# Patient Record
Sex: Female | Born: 1970
Health system: Southern US, Community
[De-identification: ages and names within clinical notes are randomized; demographics above are authoritative.]

## PROBLEM LIST (undated history)

## (undated) DIAGNOSIS — B009 Herpesviral infection, unspecified: Secondary | ICD-10-CM

## (undated) DIAGNOSIS — K219 Gastro-esophageal reflux disease without esophagitis: Secondary | ICD-10-CM

## (undated) DIAGNOSIS — F419 Anxiety disorder, unspecified: Secondary | ICD-10-CM

## (undated) DIAGNOSIS — E119 Type 2 diabetes mellitus without complications: Secondary | ICD-10-CM

## (undated) DIAGNOSIS — G2581 Restless legs syndrome: Secondary | ICD-10-CM

## (undated) DIAGNOSIS — M1990 Unspecified osteoarthritis, unspecified site: Secondary | ICD-10-CM

## (undated) DIAGNOSIS — T8859XA Other complications of anesthesia, initial encounter: Secondary | ICD-10-CM

## (undated) DIAGNOSIS — J45909 Unspecified asthma, uncomplicated: Secondary | ICD-10-CM

## (undated) DIAGNOSIS — T7840XA Allergy, unspecified, initial encounter: Secondary | ICD-10-CM

## (undated) DIAGNOSIS — E785 Hyperlipidemia, unspecified: Secondary | ICD-10-CM

## (undated) DIAGNOSIS — I10 Essential (primary) hypertension: Secondary | ICD-10-CM

## (undated) DIAGNOSIS — T884XXA Failed or difficult intubation, initial encounter: Secondary | ICD-10-CM

## (undated) DIAGNOSIS — F32A Depression, unspecified: Secondary | ICD-10-CM

## (undated) HISTORY — PX: DILATION AND CURETTAGE OF UTERUS: SHX78

## (undated) HISTORY — PX: TOTAL ABDOMINAL HYSTERECTOMY: SHX209

## (undated) HISTORY — DX: Hyperlipidemia, unspecified: E78.5

## (undated) HISTORY — DX: Restless legs syndrome: G25.81

## (undated) HISTORY — PX: TUBAL LIGATION: SHX77

## (undated) HISTORY — DX: Unspecified asthma, uncomplicated: J45.909

## (undated) HISTORY — DX: Depression, unspecified: F32.A

## (undated) HISTORY — DX: Allergy, unspecified, initial encounter: T78.40XA

## (undated) HISTORY — DX: Unspecified osteoarthritis, unspecified site: M19.90

## (undated) HISTORY — DX: Herpesviral infection, unspecified: B00.9

## (undated) HISTORY — PX: ABDOMINAL HYSTERECTOMY: SHX81

## (undated) HISTORY — DX: Gastro-esophageal reflux disease without esophagitis: K21.9

## (undated) HISTORY — DX: Failed or difficult intubation, initial encounter: T88.4XXA

## (undated) HISTORY — DX: Type 2 diabetes mellitus without complications: E11.9

## (undated) SURGERY — COLONOSCOPY WITH PROPOFOL
Anesthesia: Monitor Anesthesia Care

---

## 2008-12-03 ENCOUNTER — Emergency Department (HOSPITAL_COMMUNITY): Admission: EM | Admit: 2008-12-03 | Discharge: 2008-12-04 | Payer: Self-pay | Admitting: Family Medicine

## 2009-03-10 ENCOUNTER — Emergency Department (HOSPITAL_COMMUNITY): Admission: EM | Admit: 2009-03-10 | Discharge: 2009-03-10 | Payer: Self-pay | Admitting: Emergency Medicine

## 2009-04-22 ENCOUNTER — Emergency Department (HOSPITAL_COMMUNITY): Admission: EM | Admit: 2009-04-22 | Discharge: 2009-04-22 | Payer: Self-pay | Admitting: Emergency Medicine

## 2009-07-29 ENCOUNTER — Emergency Department (HOSPITAL_COMMUNITY): Admission: EM | Admit: 2009-07-29 | Discharge: 2009-07-29 | Payer: Self-pay | Admitting: Emergency Medicine

## 2011-01-04 ENCOUNTER — Inpatient Hospital Stay (INDEPENDENT_AMBULATORY_CARE_PROVIDER_SITE_OTHER)
Admission: RE | Admit: 2011-01-04 | Discharge: 2011-01-04 | Disposition: A | Discharge status: Home / Self Care | Payer: 59 | Source: Ambulatory Visit | Attending: Family Medicine | Admitting: Family Medicine

## 2011-01-04 DIAGNOSIS — K089 Disorder of teeth and supporting structures, unspecified: Secondary | ICD-10-CM

## 2011-02-06 LAB — COMPREHENSIVE METABOLIC PANEL
ALT: 17 U/L (ref 0–35)
AST: 19 U/L (ref 0–37)
Albumin: 3.6 g/dL (ref 3.5–5.2)
CO2: 29 mEq/L (ref 19–32)
Calcium: 9.4 mg/dL (ref 8.4–10.5)
Chloride: 102 mEq/L (ref 96–112)
GFR calc Af Amer: >60 mL/min (ref >60)
GFR calc non Af Amer: >60 mL/min (ref >60)
Sodium: 138 mEq/L (ref 135–145)

## 2011-02-06 LAB — DIFFERENTIAL
Eosinophils Absolute: 0.1 10*3/uL (ref 0.0–0.7)
Eosinophils Relative: 1 % (ref 0–5)
Lymphs Abs: 3.6 10*3/uL (ref 0.7–4.0)
Monocytes Absolute: 0.6 10*3/uL (ref 0.1–1.0)

## 2011-02-06 LAB — URINE CULTURE: Colony Count: 30000

## 2011-02-06 LAB — URINALYSIS, ROUTINE W REFLEX MICROSCOPIC
Bilirubin Urine: NEGATIVE
Glucose, UA: NEGATIVE mg/dL
Ketones, ur: NEGATIVE mg/dL
pH: 7 (ref 5.0–8.0)

## 2011-02-06 LAB — LIPASE, BLOOD: Lipase: 35 U/L (ref 11–59)

## 2011-02-06 LAB — CBC
MCHC: 33.7 g/dL (ref 30.0–36.0)
RBC: 4.35 MIL/uL (ref 3.87–5.11)
WBC: 8 10*3/uL (ref 4.0–10.5)

## 2011-02-09 LAB — URINE CULTURE
Colony Count: NO GROWTH
Culture: NO GROWTH

## 2011-02-09 LAB — POCT URINALYSIS DIP (DEVICE)
Hgb urine dipstick: NEGATIVE
Ketones, ur: NEGATIVE mg/dL
Nitrite: NEGATIVE
Specific Gravity, Urine: 1.02 (ref 1.005–1.030)
pH: 6.5 (ref 5.0–8.0)

## 2011-02-09 LAB — GC/CHLAMYDIA PROBE AMP, GENITAL
Chlamydia, DNA Probe: NEGATIVE
GC Probe Amp, Genital: NEGATIVE

## 2011-02-10 LAB — POCT URINALYSIS DIP (DEVICE)
Bilirubin Urine: NEGATIVE
Glucose, UA: NEGATIVE mg/dL
Hgb urine dipstick: NEGATIVE
Ketones, ur: NEGATIVE mg/dL
pH: 6 (ref 5.0–8.0)

## 2011-02-17 LAB — POCT PREGNANCY, URINE: Preg Test, Ur: NEGATIVE

## 2011-02-17 LAB — POCT URINALYSIS DIP (DEVICE)
Bilirubin Urine: NEGATIVE
Ketones, ur: NEGATIVE mg/dL
Protein, ur: NEGATIVE mg/dL
Specific Gravity, Urine: 1.025 (ref 1.005–1.030)
pH: 6 (ref 5.0–8.0)

## 2011-02-17 LAB — GC/CHLAMYDIA PROBE AMP, GENITAL: Chlamydia, DNA Probe: NEGATIVE

## 2011-02-17 LAB — WET PREP, GENITAL
Trich, Wet Prep: NONE SEEN
WBC, Wet Prep HPF POC: NONE SEEN

## 2011-08-31 ENCOUNTER — Ambulatory Visit (HOSPITAL_COMMUNITY): Admission: RE | Admit: 2011-08-31 | Payer: Self-pay | Source: Ambulatory Visit | Admitting: Obstetrics and Gynecology

## 2011-08-31 ENCOUNTER — Encounter (HOSPITAL_COMMUNITY): Admission: RE | Payer: Self-pay | Source: Ambulatory Visit

## 2011-08-31 SURGERY — ROBOTIC ASSISTED TOTAL HYSTERECTOMY
Anesthesia: General

## 2011-12-14 ENCOUNTER — Encounter (HOSPITAL_COMMUNITY): Payer: Self-pay

## 2011-12-24 ENCOUNTER — Other Ambulatory Visit: Payer: Self-pay | Admitting: Obstetrics and Gynecology

## 2011-12-25 ENCOUNTER — Encounter (HOSPITAL_COMMUNITY): Payer: Self-pay

## 2011-12-25 ENCOUNTER — Other Ambulatory Visit (HOSPITAL_COMMUNITY): Payer: 59

## 2011-12-25 ENCOUNTER — Encounter (HOSPITAL_COMMUNITY)
Admission: RE | Admit: 2011-12-25 | Discharge: 2011-12-25 | Disposition: A | Discharge status: Home / Self Care | Payer: 59 | Source: Ambulatory Visit | Attending: Obstetrics and Gynecology | Admitting: Obstetrics and Gynecology

## 2011-12-25 HISTORY — DX: Anxiety disorder, unspecified: F41.9

## 2011-12-25 HISTORY — DX: Essential (primary) hypertension: I10

## 2011-12-25 LAB — CBC
MCH: 27.4 pg (ref 26.0–34.0)
MCHC: 31.7 g/dL (ref 30.0–36.0)
MCV: 86.4 fL (ref 78.0–100.0)
Platelets: 372 10*3/uL (ref 150–400)

## 2011-12-25 LAB — BASIC METABOLIC PANEL
BUN: 11 mg/dL (ref 6–23)
CO2: 30 mEq/L (ref 19–32)
Calcium: 9.3 mg/dL (ref 8.4–10.5)
GFR calc non Af Amer: 80 mL/min — ABNORMAL LOW (ref >90)
Glucose, Bld: 117 mg/dL — ABNORMAL HIGH (ref 70–99)

## 2011-12-25 LAB — SURGICAL PCR SCREEN: MRSA, PCR: NEGATIVE

## 2011-12-25 NOTE — Anesthesia Preprocedure Evaluation (Addendum)
Anesthesia Evaluation    Reviewed: Unable to perform ROS - Chart review only  Airway Mallampati: III TM Distance: >3 FB Neck ROM: Full   Comment: Large neck, + tonsils Dental   Pulmonary          Cardiovascular hypertension (well controlled), Pt. on home beta blockers     Neuro/Psych Anxiety    GI/Hepatic hiatal hernia,   Endo/Other  Morbid obesity  Renal/GU      Musculoskeletal   Abdominal   Peds  Hematology   Anesthesia Other Findings   Reproductive/Obstetrics                          Anesthesia Physical Anesthesia Plan  ASA: III  Anesthesia Plan: General   Post-op Pain Management:    Induction: Intravenous  Airway Management Planned: Video Laryngoscope Planned  Additional Equipment:   Intra-op Plan:   Post-operative Plan: Extubation in OR  Informed Consent: I have reviewed the patients History and Physical, chart, labs and discussed the procedure including the risks, benefits and alternatives for the proposed anesthesia with the patient or authorized representative who has indicated his/her understanding and acceptance.   Dental Advisory Given and Dental advisory given  Plan Discussed with: CRNA and Surgeon  Anesthesia Plan Comments: (  Discussed  general anesthesia, including possible nausea, instrumentation of airway, sore throat,pulmonary aspiration, etc. I asked if the were any outstanding questions, or  concerns before we proceeded. )       Anesthesia Quick Evaluation

## 2011-12-25 NOTE — Patient Instructions (Addendum)
20 Makina Skow  12/25/2011   Your procedure is scheduled on:  12/30/11  Enter through the Main Entrance of [facility] at 6 AM.  Pick up the phone at the desk and dial [phone].   Call this number if you have problems the morning of surgery: [phone]   Remember:   Do not eat food:After Midnight.  Do not drink clear liquids: After Midnight.  Take these medicines the morning of surgery with A SIP OF WATER: Blood Pressure medication, may take Xanax if needed, hold fluid pill.   Do not wear jewelry, make-up or nail polish.  Do not wear lotions, powders, or perfumes. You may wear deodorant.  Do not shave 48 hours prior to surgery.  Do not bring valuables to the hospital.  Contacts, dentures or bridgework may not be worn into surgery.  Leave suitcase in the car. After surgery it may be brought to your room.  For patients admitted to the hospital, checkout time is 11:00 AM the day of discharge.   Patients discharged the day of surgery will not be allowed to drive home.  Name and phone number of your driver: NA  Special Instructions: CHG Shower Use Special Wash: 1/2 bottle night before surgery and 1/2 bottle morning of surgery.   Please read over the following fact sheets that you were given: MRSA Information

## 2011-12-29 MED ORDER — CLINDAMYCIN PHOSPHATE 900 MG/50ML IV SOLN
900.0000 mg | INTRAVENOUS | Status: DC
  Filled 2011-12-29: qty 50

## 2011-12-29 MED ORDER — CIPROFLOXACIN IN D5W 400 MG/200ML IV SOLN
400.0000 mg | INTRAVENOUS | Status: DC
  Filled 2011-12-29: qty 200

## 2011-12-30 ENCOUNTER — Encounter (HOSPITAL_COMMUNITY): Payer: Self-pay | Admitting: Anesthesiology

## 2011-12-30 ENCOUNTER — Ambulatory Visit (HOSPITAL_COMMUNITY)
Admission: RE | Admit: 2011-12-30 | Discharge: 2011-12-30 | DRG: 761 | Disposition: A | Discharge status: Home / Self Care | Payer: 59 | Source: Ambulatory Visit | Attending: Obstetrics and Gynecology | Admitting: Obstetrics and Gynecology

## 2011-12-30 ENCOUNTER — Encounter (HOSPITAL_COMMUNITY)
Admission: RE | Disposition: A | Discharge status: Home / Self Care | Payer: Self-pay | Source: Ambulatory Visit | Attending: Obstetrics and Gynecology

## 2011-12-30 ENCOUNTER — Encounter (HOSPITAL_COMMUNITY): Payer: Self-pay | Admitting: *Deleted

## 2011-12-30 ENCOUNTER — Ambulatory Visit (HOSPITAL_COMMUNITY): Payer: 59 | Admitting: Anesthesiology

## 2011-12-30 DIAGNOSIS — Z90710 Acquired absence of both cervix and uterus: Secondary | ICD-10-CM

## 2011-12-30 DIAGNOSIS — Z01818 Encounter for other preprocedural examination: Secondary | ICD-10-CM

## 2011-12-30 DIAGNOSIS — N946 Dysmenorrhea, unspecified: Secondary | ICD-10-CM | POA: Diagnosis present

## 2011-12-30 DIAGNOSIS — N920 Excessive and frequent menstruation with regular cycle: Secondary | ICD-10-CM | POA: Diagnosis present

## 2011-12-30 DIAGNOSIS — N8000 Endometriosis of the uterus, unspecified: Secondary | ICD-10-CM | POA: Insufficient documentation

## 2011-12-30 DIAGNOSIS — Z01812 Encounter for preprocedural laboratory examination: Secondary | ICD-10-CM

## 2011-12-30 DIAGNOSIS — D250 Submucous leiomyoma of uterus: Secondary | ICD-10-CM | POA: Insufficient documentation

## 2011-12-30 DIAGNOSIS — N838 Other noninflammatory disorders of ovary, fallopian tube and broad ligament: Secondary | ICD-10-CM | POA: Insufficient documentation

## 2011-12-30 LAB — CBC
HCT: 36.6 % (ref 36.0–46.0)
Hemoglobin: 11.8 g/dL — ABNORMAL LOW (ref 12.0–15.0)
MCHC: 32.2 g/dL (ref 30.0–36.0)
WBC: 10.4 10*3/uL (ref 4.0–10.5)

## 2011-12-30 LAB — BASIC METABOLIC PANEL
BUN: 9 mg/dL (ref 6–23)
Chloride: 98 mEq/L (ref 96–112)
Glucose, Bld: 167 mg/dL — ABNORMAL HIGH (ref 70–99)
Potassium: 3.9 mEq/L (ref 3.5–5.1)

## 2011-12-30 SURGERY — ROBOTIC ASSISTED TOTAL HYSTERECTOMY
Anesthesia: General | Site: Abdomen | Wound class: Clean Contaminated

## 2011-12-30 MED ORDER — LACTATED RINGERS IV SOLN
INTRAVENOUS | Status: DC
  Administered 2011-12-30 (×2): via INTRAVENOUS

## 2011-12-30 MED ORDER — IBUPROFEN 800 MG PO TABS: 800.0000 mg | ORAL_TABLET | Freq: Three times a day (TID) | ORAL | Status: DC | PRN

## 2011-12-30 MED ORDER — KETOROLAC TROMETHAMINE 30 MG/ML IJ SOLN
INTRAMUSCULAR | Status: DC | PRN
  Administered 2011-12-30: 30 mg via INTRAVENOUS

## 2011-12-30 MED ORDER — FENTANYL CITRATE 0.05 MG/ML IJ SOLN
INTRAMUSCULAR | Status: AC
  Filled 2011-12-30: qty 2

## 2011-12-30 MED ORDER — ACETAMINOPHEN 10 MG/ML IV SOLN
1000.0000 mg | Freq: Four times a day (QID) | INTRAVENOUS | Status: DC
  Filled 2011-12-30 (×4): qty 100

## 2011-12-30 MED ORDER — PANTOPRAZOLE SODIUM 40 MG PO TBEC
40.0000 mg | DELAYED_RELEASE_TABLET | Freq: Every day | ORAL | Status: DC
  Administered 2011-12-30: 40 mg via ORAL
  Filled 2011-12-30 (×2): qty 1

## 2011-12-30 MED ORDER — ROCURONIUM BROMIDE 50 MG/5ML IV SOLN
INTRAVENOUS | Status: AC
  Filled 2011-12-30: qty 1

## 2011-12-30 MED ORDER — ONDANSETRON HCL 4 MG/2ML IJ SOLN: 4.0000 mg | Freq: Four times a day (QID) | INTRAMUSCULAR | Status: DC | PRN

## 2011-12-30 MED ORDER — GLYCOPYRROLATE 0.2 MG/ML IJ SOLN
INTRAMUSCULAR | Status: AC
  Filled 2011-12-30: qty 1

## 2011-12-30 MED ORDER — DEXAMETHASONE SODIUM PHOSPHATE 4 MG/ML IJ SOLN
INTRAMUSCULAR | Status: DC | PRN
  Administered 2011-12-30: 10 mg via INTRAVENOUS

## 2011-12-30 MED ORDER — EPHEDRINE 5 MG/ML INJ
INTRAVENOUS | Status: AC
  Filled 2011-12-30: qty 10

## 2011-12-30 MED ORDER — BUPIVACAINE HCL (PF) 0.25 % IJ SOLN
INTRAMUSCULAR | Status: AC
  Filled 2011-12-30: qty 30

## 2011-12-30 MED ORDER — LACTATED RINGERS IR SOLN
Status: DC | PRN
  Administered 2011-12-30: 3000 mL

## 2011-12-30 MED ORDER — PROPOFOL 10 MG/ML IV EMUL
INTRAVENOUS | Status: AC
  Filled 2011-12-30: qty 20

## 2011-12-30 MED ORDER — DEXTROSE IN LACTATED RINGERS 5 % IV SOLN: INTRAVENOUS | Status: DC

## 2011-12-30 MED ORDER — CIPROFLOXACIN IN D5W 400 MG/200ML IV SOLN
400.0000 mg | Freq: Two times a day (BID) | INTRAVENOUS | Status: AC
  Administered 2011-12-30: 400 mg via INTRAVENOUS
  Filled 2011-12-30: qty 200

## 2011-12-30 MED ORDER — KETOROLAC TROMETHAMINE 30 MG/ML IJ SOLN
30.0000 mg | Freq: Four times a day (QID) | INTRAMUSCULAR | Status: DC
  Administered 2011-12-30: 30 mg via INTRAVENOUS
  Filled 2011-12-30: qty 1

## 2011-12-30 MED ORDER — FENTANYL CITRATE 0.05 MG/ML IJ SOLN
INTRAMUSCULAR | Status: DC | PRN
  Administered 2011-12-30 (×2): 50 ug via INTRAVENOUS
  Administered 2011-12-30: 100 ug via INTRAVENOUS
  Administered 2011-12-30 (×2): 50 ug via INTRAVENOUS

## 2011-12-30 MED ORDER — NEOSTIGMINE METHYLSULFATE 1 MG/ML IJ SOLN
INTRAMUSCULAR | Status: AC
  Filled 2011-12-30: qty 10

## 2011-12-30 MED ORDER — FENTANYL CITRATE 0.05 MG/ML IJ SOLN
INTRAMUSCULAR | Status: AC
  Filled 2011-12-30: qty 10

## 2011-12-30 MED ORDER — EPHEDRINE SULFATE 50 MG/ML IJ SOLN
INTRAMUSCULAR | Status: DC | PRN
  Administered 2011-12-30 (×3): 10 mg via INTRAVENOUS

## 2011-12-30 MED ORDER — ONDANSETRON HCL 4 MG PO TABS: 4.0000 mg | ORAL_TABLET | Freq: Four times a day (QID) | ORAL | Status: DC | PRN

## 2011-12-30 MED ORDER — ACETAMINOPHEN 10 MG/ML IV SOLN
INTRAVENOUS | Status: DC | PRN
  Administered 2011-12-30: 1000 mg via INTRAVENOUS

## 2011-12-30 MED ORDER — LIDOCAINE HCL (CARDIAC) 20 MG/ML IV SOLN
INTRAVENOUS | Status: AC
  Filled 2011-12-30: qty 5

## 2011-12-30 MED ORDER — BUPIVACAINE HCL (PF) 0.25 % IJ SOLN
INTRAMUSCULAR | Status: DC | PRN
  Administered 2011-12-30: 11 mL

## 2011-12-30 MED ORDER — CLINDAMYCIN PHOSPHATE 900 MG/50ML IV SOLN
900.0000 mg | Freq: Three times a day (TID) | INTRAVENOUS | Status: AC
  Administered 2011-12-30: 900 mg via INTRAVENOUS
  Filled 2011-12-30: qty 50

## 2011-12-30 MED ORDER — MIDAZOLAM HCL 2 MG/2ML IJ SOLN
INTRAMUSCULAR | Status: AC
  Filled 2011-12-30: qty 2

## 2011-12-30 MED ORDER — PROPOFOL 10 MG/ML IV EMUL
INTRAVENOUS | Status: DC | PRN
  Administered 2011-12-30: 200 mg via INTRAVENOUS

## 2011-12-30 MED ORDER — HYDROMORPHONE HCL PF 1 MG/ML IJ SOLN: 0.2000 mg | INTRAMUSCULAR | Status: DC | PRN

## 2011-12-30 MED ORDER — IBUPROFEN 800 MG PO TABS: 800.0000 mg | ORAL_TABLET | Freq: Three times a day (TID) | ORAL | Status: AC | PRN

## 2011-12-30 MED ORDER — ROCURONIUM BROMIDE 100 MG/10ML IV SOLN
INTRAVENOUS | Status: DC | PRN
  Administered 2011-12-30: 20 mg via INTRAVENOUS
  Administered 2011-12-30: 10 mg via INTRAVENOUS
  Administered 2011-12-30: 50 mg via INTRAVENOUS

## 2011-12-30 MED ORDER — CLINDAMYCIN PHOSPHATE 600 MG/50ML IV SOLN
INTRAVENOUS | Status: DC | PRN
  Administered 2011-12-30: 600 mg via INTRAVENOUS

## 2011-12-30 MED ORDER — CIPROFLOXACIN IN D5W 400 MG/200ML IV SOLN
INTRAVENOUS | Status: DC | PRN
  Administered 2011-12-30: 400 mg via INTRAVENOUS

## 2011-12-30 MED ORDER — OXYCODONE-ACETAMINOPHEN 5-325 MG PO TABS
1.0000 | ORAL_TABLET | ORAL | Status: DC | PRN
  Administered 2011-12-30 (×2): 2 via ORAL
  Filled 2011-12-30 (×2): qty 2

## 2011-12-30 MED ORDER — KETOROLAC TROMETHAMINE 30 MG/ML IJ SOLN: 30.0000 mg | Freq: Four times a day (QID) | INTRAMUSCULAR | Status: DC

## 2011-12-30 MED ORDER — HYDROMORPHONE HCL PF 1 MG/ML IJ SOLN
INTRAMUSCULAR | Status: AC
  Administered 2011-12-30: 0.5 mg via INTRAVENOUS
  Filled 2011-12-30: qty 1

## 2011-12-30 MED ORDER — ONDANSETRON HCL 4 MG/2ML IJ SOLN
INTRAMUSCULAR | Status: AC
  Filled 2011-12-30: qty 2

## 2011-12-30 MED ORDER — OXYCODONE-ACETAMINOPHEN 5-325 MG PO TABS: 1.0000 | ORAL_TABLET | ORAL | Status: AC | PRN

## 2011-12-30 MED ORDER — HYDROMORPHONE HCL PF 1 MG/ML IJ SOLN
0.2500 mg | INTRAMUSCULAR | Status: DC | PRN
  Administered 2011-12-30: 0.25 mg via INTRAVENOUS
  Administered 2011-12-30: 0.5 mg via INTRAVENOUS
  Administered 2011-12-30: 0.25 mg via INTRAVENOUS

## 2011-12-30 MED ORDER — HYDROCHLOROTHIAZIDE 25 MG PO TABS
25.0000 mg | ORAL_TABLET | Freq: Every day | ORAL | Status: DC
  Administered 2011-12-30: 25 mg via ORAL
  Filled 2011-12-30 (×2): qty 1

## 2011-12-30 MED ORDER — DEXAMETHASONE SODIUM PHOSPHATE 10 MG/ML IJ SOLN
INTRAMUSCULAR | Status: AC
  Filled 2011-12-30: qty 1

## 2011-12-30 MED ORDER — LACTATED RINGERS IV SOLN
INTRAVENOUS | Status: DC | PRN
  Administered 2011-12-30: 07:00:00 via INTRAVENOUS

## 2011-12-30 MED ORDER — LOSARTAN POTASSIUM 50 MG PO TABS
100.0000 mg | ORAL_TABLET | Freq: Every day | ORAL | Status: DC
  Filled 2011-12-30 (×2): qty 2

## 2011-12-30 MED ORDER — ATENOLOL 100 MG PO TABS: 100.0000 mg | ORAL_TABLET | Freq: Every day | ORAL | Status: DC

## 2011-12-30 MED ORDER — LIDOCAINE HCL (CARDIAC) 20 MG/ML IV SOLN
INTRAVENOUS | Status: DC | PRN
  Administered 2011-12-30: 100 mg via INTRAVENOUS

## 2011-12-30 MED ORDER — LOSARTAN POTASSIUM-HCTZ 100-25 MG PO TABS: 1.0000 | ORAL_TABLET | Freq: Every day | ORAL | Status: DC

## 2011-12-30 MED ORDER — MENTHOL 3 MG MT LOZG: 1.0000 | LOZENGE | OROMUCOSAL | Status: DC | PRN

## 2011-12-30 MED ORDER — MIDAZOLAM HCL 5 MG/5ML IJ SOLN
INTRAMUSCULAR | Status: DC | PRN
  Administered 2011-12-30: 2 mg via INTRAVENOUS

## 2011-12-30 SURGICAL SUPPLY — 62 items
BAG URINE DRAINAGE (UROLOGICAL SUPPLIES) ×4 IMPLANT
BARRIER ADHS 3X4 INTERCEED (GAUZE/BANDAGES/DRESSINGS) IMPLANT
CABLE HIGH FREQUENCY MONO STRZ (ELECTRODE) ×4 IMPLANT
CATH FOLEY 3WAY  5CC 16FR (CATHETERS) ×1
CATH FOLEY 3WAY 5CC 16FR (CATHETERS) ×3 IMPLANT
CHLORAPREP W/TINT 26ML (MISCELLANEOUS) ×8 IMPLANT
CLOTH BEACON ORANGE TIMEOUT ST (SAFETY) ×4 IMPLANT
CONT PATH 16OZ SNAP LID 3702 (MISCELLANEOUS) ×4 IMPLANT
COVER MAYO STAND STRL (DRAPES) ×4 IMPLANT
COVER TABLE BACK 60X90 (DRAPES) ×8 IMPLANT
COVER TIP SHEARS 8 DVNC (MISCELLANEOUS) ×3 IMPLANT
COVER TIP SHEARS 8MM DA VINCI (MISCELLANEOUS) ×1
DECANTER SPIKE VIAL GLASS SM (MISCELLANEOUS) IMPLANT
DERMABOND ADVANCED (GAUZE/BANDAGES/DRESSINGS) ×1
DERMABOND ADVANCED .7 DNX12 (GAUZE/BANDAGES/DRESSINGS) ×3 IMPLANT
DRAPE HUG U DISPOSABLE (DRAPE) ×4 IMPLANT
DRAPE LG THREE QUARTER DISP (DRAPES) ×8 IMPLANT
DRAPE MONITOR DA VINCI (DRAPE) IMPLANT
DRAPE WARM FLUID 44X44 (DRAPE) ×4 IMPLANT
ELECT REM PT RETURN 9FT ADLT (ELECTROSURGICAL) ×4
ELECTRODE REM PT RTRN 9FT ADLT (ELECTROSURGICAL) ×3 IMPLANT
EVACUATOR SMOKE 8.L (FILTER) ×4 IMPLANT
GAUZE VASELINE 3X9 (GAUZE/BANDAGES/DRESSINGS) IMPLANT
GLOVE BIO SURGEON STRL SZ 6.5 (GLOVE) ×16 IMPLANT
GLOVE BIOGEL PI IND STRL 7.0 (GLOVE) ×12 IMPLANT
GLOVE BIOGEL PI INDICATOR 7.0 (GLOVE) ×4
GOWN STRL REIN XL XLG (GOWN DISPOSABLE) ×24 IMPLANT
KIT ACCESSORY DA VINCI DISP (KITS) ×1
KIT ACCESSORY DVNC DISP (KITS) ×3 IMPLANT
KIT DISP ACCESSORY 4 ARM (KITS) IMPLANT
NEEDLE INSUFFLATION 14GA 120MM (NEEDLE) IMPLANT
NEEDLE INSUFFLATION 14GA 150MM (NEEDLE) ×4 IMPLANT
NS IRRIG 1000ML POUR BTL (IV SOLUTION) ×12 IMPLANT
OCCLUDER COLPOPNEUMO (BALLOONS) ×4 IMPLANT
PACK LAVH (CUSTOM PROCEDURE TRAY) ×4 IMPLANT
PAD PREP 24X48 CUFFED NSTRL (MISCELLANEOUS) ×8 IMPLANT
PLUG CATH AND CAP STER (CATHETERS) ×4 IMPLANT
PROTECTOR NERVE ULNAR (MISCELLANEOUS) ×8 IMPLANT
SCISSORS LAP 5X35 DISP (ENDOMECHANICALS) IMPLANT
SET IRRIG TUBING LAPAROSCOPIC (IRRIGATION / IRRIGATOR) ×4 IMPLANT
SOLUTION ELECTROLUBE (MISCELLANEOUS) ×4 IMPLANT
SPONGE LAP 18X18 X RAY DECT (DISPOSABLE) IMPLANT
SUT VIC AB 0 CT1 27 (SUTURE) ×5
SUT VIC AB 0 CT1 27XBRD ANTBC (SUTURE) ×15 IMPLANT
SUT VICRYL 0 27 CT2 27 ABS (SUTURE) ×24 IMPLANT
SUT VICRYL 0 UR6 27IN ABS (SUTURE) ×4 IMPLANT
SUT VICRYL 4-0 PS2 18IN ABS (SUTURE) ×16 IMPLANT
SYR 50ML LL SCALE MARK (SYRINGE) ×4 IMPLANT
SYSTEM CONVERTIBLE TROCAR (TROCAR) IMPLANT
TIP UTERINE 5.1X6CM LAV DISP (MISCELLANEOUS) IMPLANT
TIP UTERINE 6.7X10CM GRN DISP (MISCELLANEOUS) IMPLANT
TIP UTERINE 6.7X6CM WHT DISP (MISCELLANEOUS) IMPLANT
TIP UTERINE 6.7X8CM BLUE DISP (MISCELLANEOUS) ×4 IMPLANT
TOWEL OR 17X24 6PK STRL BLUE (TOWEL DISPOSABLE) ×12 IMPLANT
TROCAR 12M 150ML BLUNT (TROCAR) ×4 IMPLANT
TROCAR 5M 150ML BLDLS (TROCAR) ×4 IMPLANT
TROCAR DISP BLADELESS 8 DVNC (TROCAR) IMPLANT
TROCAR DISP BLADELESS 8MM (TROCAR)
TROCAR Z-THREAD 12X150 (TROCAR) ×4 IMPLANT
TROCAR Z-THREAD BLADED 12X100M (TROCAR) ×4 IMPLANT
TUBING FILTER THERMOFLATOR (ELECTROSURGICAL) ×4 IMPLANT
WARMER LAPAROSCOPE (MISCELLANEOUS) ×4 IMPLANT

## 2011-12-30 NOTE — Anesthesia Postprocedure Evaluation (Signed)
Anesthesia Post Note  Patient: Vanessa Reeves  Procedure(s) Performed: Procedure(s) (LRB): ROBOTIC ASSISTED TOTAL HYSTERECTOMY (N/A) BILATERAL SALPINGECTOMY (Bilateral)  Anesthesia type: General  Patient location: PACU  Post pain: Pain level controlled  Post assessment: Post-op Vital signs reviewed  Last Vitals:  Filed Vitals:   12/30/11 1145  BP: 139/79  Pulse: 50  Temp:   Resp:     Post vital signs: Reviewed  Level of consciousness: sedated  Complications: No apparent anesthesia complications

## 2011-12-30 NOTE — Discharge Instructions (Signed)
Call if temperature greater than equal to 100.4, nothing per vagina for 4-6 weeks or severe nausea vomiting, increased incisional pain , drainage or redness in the incision site, no straining with bowel movements, showers no bath °

## 2011-12-30 NOTE — Progress Notes (Signed)
DC instructions reviewed with patient and husband. Both state understanding. DC IV, assessed lap sites approximated with no drainage and dermabond. Checked peripad with no bleeding just a scant pinkish discharge. Pt being wheeled out to main entrance by Casey, NT.

## 2011-12-30 NOTE — Brief Op Note (Signed)
12/30/2011  10:53 AM  PATIENT:  Vanessa Reeves  41 y.o. female  PRE-OPERATIVE DIAGNOSIS:  Severe dysmenorrhea, Menorrhagia  POST-OPERATIVE DIAGNOSIS:  Severe dysmenorrhea, Menorrhagia  PROCEDURE:  Procedure(s) (LRB):DAVINCI ROBOTIC ASSISTED TOTAL HYSTERECTOMY (N/A) BILATERAL SALPINGECTOMY (Bilateral)  SURGEON:  Surgeon(s) and Role:    * Marlen Koman Cathie Beams, MD - Primary    * Robley Fries, MD - Assisting  PHYSICIAN ASSISTANT:   ASSISTANTS: VAISHALI MODY, M.D  ANESTHESIA:   general  FINDINGS: 10 WK SIZE UTERUS,  RIGHT ANT ABD WALL ADHESIONS, NL APPENDIX, NL OVARIES, TUBES WITH FALOPE RINGS, FILMY ADHESIONS OF RIGHT OV TO POST UTERUS, LEFT TUBE ADHERENT TO LEFT OV URETERS PERISTALSING BILATERALLY  EBL:  Total I/O In: 500 [I.V.:500] Out: 200 [Urine:150; Blood:50]  BLOOD ADMINISTERED:none  DRAINS: none   LOCAL MEDICATIONS USED:  MARCAINE     SPECIMEN:  Source of Specimen:  UTERUS W/ CERVIX, BOTH TUBES  DISPOSITION OF SPECIMEN:  PATHOLOGY  COUNTS:  YES  TOURNIQUET:  * No tourniquets in log *  DICTATION: .Other Dictation: Dictation Number   PLAN OF CARE: OBSERVATION  PATIENT DISPOSITION:  PACU - hemodynamically stable.   Delay start of Pharmacological VTE agent (>24hrs) due to surgical blood loss or risk of bleeding: no

## 2011-12-30 NOTE — Anesthesia Procedure Notes (Signed)
Procedure Name: Intubation Date/Time: 12/30/2011 7:44 AM Performed by: Holley Kocurek MARIE Pre-anesthesia Checklist: Patient identified, Patient being monitored, Emergency Drugs available, Timeout performed and Suction available Patient Re-evaluated:Patient Re-evaluated prior to inductionOxygen Delivery Method: Circle system utilized Preoxygenation: Pre-oxygenation with 100% oxygen Intubation Type: IV induction Ventilation: Mask ventilation without difficulty Laryngoscope Size: Mac and 4 Grade View: Grade I Tube type: Oral Tube size: 7.0 mm Number of attempts: 1 Airway Equipment and Method: Video-laryngoscopy Placement Confirmation: ETT inserted through vocal cords under direct vision,  breath sounds checked- equal and bilateral and CO2 detector Secured at: 20 cm Dental Injury: Teeth and Oropharynx as per pre-operative assessment

## 2011-12-30 NOTE — Progress Notes (Signed)
Subjective: Patient reports tolerating PO and no problems voiding.   Notes some lightheaded w/ sitting up that resolves with walking Objective: I have reviewed patient's vital signs.  vital signs, intake and output and labs. Filed Vitals:   12/30/11 1330  BP: 142/93  Pulse: 53  Temp: 97.7 F (36.5 C)  Resp: 18     Total I/O In: 900 [I.V.:900] Out: 300 [Urine:250; Blood:50]  Lab Results  Component Value Date   WBC 10.4 12/30/2011   HGB 11.8* 12/30/2011   HCT 36.6 12/30/2011   MCV 86.3 12/30/2011   PLT 322 12/30/2011   Lab Results  Component Value Date   CREATININE 0.84 12/30/2011    EXAM General: alert, cooperative and no distress Resp: clear to auscultation bilaterally Cardio: regular rate and rhythm, S1, S2 normal, no murmur, click, rub or gallop GI: soft, non-tender; bowel sounds normal; no masses,  no organomegaly incisions well approximated Extremities: no edema, redness or tenderness in the calves or thighs Vaginal Bleeding: minimal  Assessment: s/p Procedure(s):DAVINCI ROBOTIC ASSISTED TOTAL HYSTERECTOMY BILATERAL SALPINGECTOMY: stable  Plan: Advance diet Encourage ambulation Discontinue IV fluids Discharge home  LOS: 0 days d/c instructions reviewed. Script percocet, motrin. F/u 2 wk   Boruch Manuele A, MD 12/30/2011 6:32 PM    12/30/2011, 6:32 PM

## 2011-12-30 NOTE — Transfer of Care (Signed)
Immediate Anesthesia Transfer of Care Note  Patient: Vanessa Reeves  Procedure(s) Performed: Procedure(s) (LRB): ROBOTIC ASSISTED TOTAL HYSTERECTOMY (N/A) BILATERAL SALPINGECTOMY (Bilateral)  Patient Location: PACU  Anesthesia Type: General  Level of Consciousness: awake, alert  and sedated  Airway & Oxygen Therapy: Patient Spontanous Breathing and Patient connected to nasal cannula oxygen  Post-op Assessment: Report given to PACU RN and Post -op Vital signs reviewed and stable  Post vital signs: Reviewed and stable  Complications: No apparent anesthesia complications

## 2011-12-31 NOTE — Op Note (Signed)
Vanessa Reeves, Vanessa Reeves              ACCOUNT NO.:  [id number]  MEDICAL RECORD NO.:  [id number]  LOCATION:  9312                          FACILITY:  [facility]  PHYSICIAN:  Maxie Better, M.D.DATE OF BIRTH:  October 15, 1971  DATE OF PROCEDURE:  12/30/2011 DATE OF DISCHARGE:  12/30/2011                              OPERATIVE REPORT   PREOPERATIVE DIAGNOSES: 1. Severe dysmenorrhea. 2. Menorrhagia.  PROCEDURES: 1. Da Vinci robotic-assisted total hysterectomy. 2. Bilateral salpingectomy.  POSTOPERATIVE DIAGNOSES: 1. Severe dysmenorrhea. 2. Menorrhagia.  ANESTHESIA:  General.  SURGEON:  Maxie Better, MD  ASSISTANT:  Darryl Nestle, MD  DESCRIPTION OF PROCEDURE:  Under adequate general anesthesia, the patient was placed in the dorsal lithotomy position.  The patient was positioned for robotic surgery.  She was sterilely prepped and draped in usual fashion.  Examination under anesthesia revealed about a 10-week size uterus.  No adnexal masses could be appreciated.  A weighted speculum was placed in the vagina.  Sims retractor was placed anteriorly.  The cervix was parous.  Single-tooth tenaculum was placed on the anterior lip of the cervix.  A 0 Vicryl figure-of-eight sutures was placed in the anterior and posterior lip of the cervix.  The uterus was sounded to 8 cm.  The cervix was serially dilated.  A #8 manipulator with a medium size RUMI KOH ring was inserted for the uterine manipulator.  Three-way Foley was then placed sterilely.  Attention was then turned to the abdomen.  Due to the patient's obesity, a supraumbilical incision was then made after 0.25% Marcaine was injected. A small vertical incision was then made.  Veress needle was introduced and tested with normal saline. Opening pressure of 7 was noted.  A 3.5 L of CO2 was insufflated.  Veress needle was then removed.  A 12-mm disposable trocar with sleeve was introduced into the abdomen without incident.  The robotic  camera was then placed through that port.  The patient was then placed in Trendelenburg position.  Upper abdomen revealed normal liver edge.  Pelvic inspection revealed adhesions to the right anterior abdominal wall midway on the right.  Additional port sites was placed with two 8 mm robotic ports, hand's breadth away from each other and on the right, a 5-mm right lower quadrant port site was placed as was the 8-mm robotic port was placed.  Once these were done, the robot was docked to the patient's left.  A Pro grasper, PK dissector, and monopolar scissors were then utilized and placed.  Once this was done, I went to the surgical console.  At the surgical console, the part of the adhesions were on the side, anterior abdominal wall was lysed.  Attention was then turned to the uterus which showed no evidence of endometriosis in the anterior or posterior cul-de-sac.  Previous Falope rings on the both tubes were noted.  Both ovaries were noted to be normal.  Both ureters were found to be peristalsing deep in the pelvis. The procedure was started with bilaterally serially clamping, cauterize and cutting the mesosalpinx of the right fallopian tube followed by the round ligament which was also clamped, cauterized, and cut.  The anterior and posterior broad leaf of the broad ligament  were then opened.  The uterine vessels subsequently skeletonized.  The vesicouterine peritoneum was opened transversely and this placed with some sharp dissection.  Uterine vessels on the right were isolated, serially cauterized but not cut.  Tension was placed in the contralateral side.  Similar procedure was performed with the mesosalpinx of the left tube, being serially clamped, cauterized, and cut.  The round ligament also on the left being clamped, cauterized, and cut.  The utero-ovarian ligament on the left was also serially clamped, cauterized, and cut, carried down to the vesicouterine peritoneum.   The anterior and posterior leaf of the broad ligament opened and the vesicouterine peritoneum was opened and extended inferiorly.  Uterine vessels were then serially clamped, cauterized, and cut.  When this was done, the uterine vessels were then also re-clamped, cauterized, and then cut.  The uterus noted to be blanched anteriorly.  The vesicouterine peritoneum was further dissected inferiorly. Subsequently, the cervicovaginal junction was identified and opened with monopolar cutting scissors and circumferentially, the cervix was removed from its vaginal attachment.  Once this was done, the uterus with both fallopian tubes were then removed through the vagina.  The vaginal cuff was then elevated.  Small bleeders cauterized.  Interrupted 0 Vicryl figure-of-eight sutures were then placed to close the vaginal cuff.  The abdomen was then irrigated and suctioned.  Good hemostasis noted throughout.  At that point, the procedure was felt to be complete.  The robotic instruments were then removed.  The robot was then undocked. The pelvis was inspected, irrigated, and suctioned with good hemostasis noted.  The robotic ports were then removed.  Abdomen deflated.  The incisions closed with 4-0 Vicryl subcuticular stitch and 0 Vicryl figure- of-eight for the fascial stitch at the supraumbilical incision site.  SPECIMENS:  Uterus with cervix and fallopian tubes sent to Pathology.  ESTIMATED BLOOD LOSS:  50 mL.  INTRAOPERATIVE FLUID:  500 mL.  URINE OUTPUT:  200 mL, clear yellow urine.  COUNTS:  Sponge and instrument counts x2 was correct.  COMPLICATION:  None.  The patient tolerated the procedure well, was transferred to recovery room in stable condition.     Maxie Better, M.D.     [facility]  D:  12/30/2011  T:  12/31/2011  Job:  [id number]

## 2012-05-07 ENCOUNTER — Encounter (HOSPITAL_COMMUNITY): Payer: Self-pay | Admitting: *Deleted

## 2012-05-07 ENCOUNTER — Inpatient Hospital Stay (HOSPITAL_COMMUNITY)
Admission: AD | Admit: 2012-05-07 | Discharge: 2012-05-07 | Disposition: A | Discharge status: Home / Self Care | Payer: 59 | Source: Ambulatory Visit | Attending: Obstetrics and Gynecology | Admitting: Obstetrics and Gynecology

## 2012-05-07 DIAGNOSIS — N898 Other specified noninflammatory disorders of vagina: Secondary | ICD-10-CM

## 2012-05-07 DIAGNOSIS — R102 Pelvic and perineal pain: Secondary | ICD-10-CM

## 2012-05-07 DIAGNOSIS — R030 Elevated blood-pressure reading, without diagnosis of hypertension: Secondary | ICD-10-CM | POA: Insufficient documentation

## 2012-05-07 DIAGNOSIS — N930 Postcoital and contact bleeding: Secondary | ICD-10-CM | POA: Insufficient documentation

## 2012-05-07 DIAGNOSIS — N949 Unspecified condition associated with female genital organs and menstrual cycle: Secondary | ICD-10-CM | POA: Insufficient documentation

## 2012-05-07 DIAGNOSIS — Z90710 Acquired absence of both cervix and uterus: Secondary | ICD-10-CM | POA: Insufficient documentation

## 2012-05-07 DIAGNOSIS — L918 Other hypertrophic disorders of the skin: Secondary | ICD-10-CM | POA: Insufficient documentation

## 2012-05-07 MED ORDER — KETOROLAC TROMETHAMINE 30 MG/ML IJ SOLN
30.0000 mg | Freq: Once | INTRAMUSCULAR | Status: AC
  Administered 2012-05-07: 30 mg via INTRAMUSCULAR

## 2012-05-07 MED ORDER — KETOROLAC TROMETHAMINE 30 MG/ML IJ SOLN
30.0000 mg | Freq: Once | INTRAMUSCULAR | Status: DC
  Filled 2012-05-07: qty 1

## 2012-05-07 NOTE — Progress Notes (Signed)
Dr Juliene Pina notified of pt's admission and status. No orders received. MD will come see pt

## 2012-05-07 NOTE — Progress Notes (Signed)
Dr Juliene Pina on unit and aware of elevated B/Ps.

## 2012-05-07 NOTE — Progress Notes (Signed)
Written and verbal d/c instructions given and understanding voiced. 

## 2012-05-07 NOTE — MAU Note (Signed)
Had hysterectomy 2/27 and everything went well. Vaginal pain with throbbing. Bleeding after having intercourse.

## 2012-05-07 NOTE — Progress Notes (Signed)
Silver Nitrate used on some granulation tissue per Dr Juliene Pina

## 2012-05-07 NOTE — MAU Provider Note (Addendum)
History     CSN: [id number]  Arrival date and time: 05/07/12 0219   First Provider Initiated Contact with Patient 05/07/12 [phone]      Chief Complaint  Patient presents with  . Vaginal Pain  . Vaginal Bleeding   HPI S/p Robotic TLH/ Bilat salpinx (Dr Cherly Hensen) in Feb'13. Uncomplicated surgery and post op stay. Path- benign.  C/o postcoital vaginal bleeding and pain since few weeks (3-4 wks) and got worse recently. Not called/contacted office for this but presented to ER today for evaluation. Last coitus ad bleeding few days back, but here due to pelvic/ vaginal pain and thought she should get evaluation at th hospital where she had her surgery.  No nausea/vomiting/ bladder or bowel complaints. No fever/chills/ foul vaginal discharge. No incisional pain. Last seen in office at 6 wk post-op check and fatigue evaluation in Apr'13.    Past Medical History  Diagnosis Date  . Hypertension   . Anxiety     Past Surgical History  Procedure Date  . Tubal ligation   . Dilation and curettage of uterus   . Abdominal hysterectomy     Family History  Problem Relation Age of Onset  . Other Neg Hx     History  Substance Use Topics  . Smoking status: Former Games developer  . Smokeless tobacco: Not on file  . Alcohol Use: Yes     socially    Allergies:  Allergies  Allergen Reactions  . Penicillins Other (See Comments)    Childhood allergy.    Prescriptions prior to admission  Medication Sig Dispense Refill  . ALPRAZolam (XANAX) 0.5 MG tablet Take 0.5 mg by mouth 2 (two) times daily.      [name] atenolol (TENORMIN) 50 MG tablet Take 100 mg by mouth at bedtime.      [name] desonide (DESOWEN) 0.05 % cream Apply 1 application topically 2 (two) times daily.      [name] ibuprofen (ADVIL,MOTRIN) 800 MG tablet Take 800 mg by mouth every 8 (eight) hours as needed.      [name] losartan-hydrochlorothiazide (HYZAAR) 100-25 MG per tablet Take 1 tablet by mouth daily.      . Sulfacetamide Sodium-Sulfur (CLARIFOAM EF EX)  Apply 1 application topically daily.      [name] HYDROcodone-acetaminophen (VICODIN) 5-500 MG per tablet Take 1-2 tablets by mouth every 6 (six) hours as needed.      [name] HYDROmorphone (DILAUDID) 4 MG tablet Take 4 mg by mouth every 4 (four) hours as needed. pain        ROS Physical Exam   Blood pressure 144/78, pulse 61, temperature 97.2 F (36.2 C), temperature source Oral, resp. rate 20, height 5\' 4"  (1.626 m), weight 132.722 kg (292 lb 9.6 oz), last menstrual period 12/04/2011.  Physical Exam A&O x 3, no acute distress.  HEENT neg Abdo soft, non tender, non acute Extr no edema/ tenderness Pelvic - Vaginal cuff noted 1 cm central granulation tissue. Tender to touch there only and rest of the vaginal walls are non tender to q-tip touch.  Slight brown/yellow discharge noted (patient feels "sore" on labial palpation). Wet prep done.  Bimanual exam - tender vaginal cuff at the 1cm palpable granulation tissue. No pelvic masses/ fullness above the cuff felt.   MAU Course  Procedures 1) Wet Prep- plenty of red and white cells but negative for infection (no yeast/BV/trich) 2) Silver nitrate applied on granulation tissue, tolerated with some pain complaint.   Assessment and Plan  Post op vaginal wall granulation  tissue following hysterectomy in Feb'13 with postcoital bleeding and pain.  S/p Silver nitrate application and negative wet prep for infection  Advised No coitus until resolved, will need to come to office and see primary MD Dr Cherly Hensen, call office on Mon morning for further eval/treatment.  PO Ibuprofen with food as needed 600-800 mg 3 times/day.   Patient understands and agrees.   Stancil Deisher R 05/07/2012, 4:13 AM   Addendum-- BP still elevated, says no missed dose. Prefers to take pain med(ibuprofen) after going home with some food. Will need to take antiHTN meds sooner than scheduled time, home BP checks and call Primary Care office to further evaluate and treat HTN for better  control.  --V.Juliene Pina, MD

## 2013-06-25 ENCOUNTER — Emergency Department (HOSPITAL_BASED_OUTPATIENT_CLINIC_OR_DEPARTMENT_OTHER)
Admission: EM | Admit: 2013-06-25 | Discharge: 2013-06-25 | Disposition: A | Discharge status: Home / Self Care | Payer: 59 | Attending: Emergency Medicine | Admitting: Emergency Medicine

## 2013-06-25 ENCOUNTER — Encounter (HOSPITAL_BASED_OUTPATIENT_CLINIC_OR_DEPARTMENT_OTHER): Payer: Self-pay

## 2013-06-25 DIAGNOSIS — L03211 Cellulitis of face: Secondary | ICD-10-CM | POA: Insufficient documentation

## 2013-06-25 DIAGNOSIS — F411 Generalized anxiety disorder: Secondary | ICD-10-CM | POA: Insufficient documentation

## 2013-06-25 DIAGNOSIS — I10 Essential (primary) hypertension: Secondary | ICD-10-CM | POA: Insufficient documentation

## 2013-06-25 DIAGNOSIS — Z880 Allergy status to penicillin: Secondary | ICD-10-CM | POA: Insufficient documentation

## 2013-06-25 DIAGNOSIS — Z87891 Personal history of nicotine dependence: Secondary | ICD-10-CM | POA: Insufficient documentation

## 2013-06-25 DIAGNOSIS — J3489 Other specified disorders of nose and nasal sinuses: Secondary | ICD-10-CM | POA: Insufficient documentation

## 2013-06-25 DIAGNOSIS — L0201 Cutaneous abscess of face: Secondary | ICD-10-CM | POA: Insufficient documentation

## 2013-06-25 DIAGNOSIS — Z79899 Other long term (current) drug therapy: Secondary | ICD-10-CM | POA: Insufficient documentation

## 2013-06-25 DIAGNOSIS — H538 Other visual disturbances: Secondary | ICD-10-CM | POA: Insufficient documentation

## 2013-06-25 MED ORDER — CLINDAMYCIN HCL 150 MG PO CAPS: 450.0000 mg | ORAL_CAPSULE | Freq: Three times a day (TID) | ORAL | Status: DC

## 2013-06-25 MED ORDER — SULFAMETHOXAZOLE-TMP DS 800-160 MG PO TABS
1.0000 | ORAL_TABLET | Freq: Once | ORAL | Status: AC
  Administered 2013-06-25: 1 via ORAL
  Filled 2013-06-25: qty 1

## 2013-06-25 MED ORDER — CLINDAMYCIN HCL 150 MG PO CAPS: 450.0000 mg | ORAL_CAPSULE | Freq: Once | ORAL | Status: DC

## 2013-06-25 MED ORDER — SULFAMETHOXAZOLE-TRIMETHOPRIM 800-160 MG PO TABS: 1.0000 | ORAL_TABLET | Freq: Two times a day (BID) | ORAL | Status: DC

## 2013-06-25 NOTE — ED Provider Notes (Signed)
CSN: [id number]     Arrival date & time 06/25/13  8657 History     First MD Initiated Contact with Patient 06/25/13 [phone]     Chief Complaint  Patient presents with  . Facial Swelling   (Consider location/radiation/quality/duration/timing/severity/associated sxs/prior Treatment) Patient is a 42 y.o. female presenting with abscess. The history is provided by the patient.  Abscess Location:  Face Facial abscess location:  R cheek Size:  <0.5 cm Abscess quality: draining, painful and redness   Abscess quality: no fluctuance, no induration, no itching, no warmth and not weeping   Red streaking: yes (very minimal)   Duration:  3 days Progression:  Worsening Pain details:    Quality:  Aching   Severity:  Mild   Timing:  Constant   Progression:  Unchanged Chronicity:  New Context: not diabetes and not immunosuppression   Relieved by:  Nothing Worsened by:  Nothing tried Ineffective treatments:  None tried Associated symptoms: no fever and no vomiting     Past Medical History  Diagnosis Date  . Hypertension   . Anxiety    Past Surgical History  Procedure Laterality Date  . Tubal ligation    . Dilation and curettage of uterus    . Abdominal hysterectomy     Family History  Problem Relation Age of Onset  . Other Neg Hx    History  Substance Use Topics  . Smoking status: Former Games developer  . Smokeless tobacco: Not on file  . Alcohol Use: Yes     Comment: socially   OB History   Grav Para Term Preterm Abortions TAB SAB Ect Mult Living   5 3 2 1 2 1 1         Review of Systems  Constitutional: Negative for fever and chills.  HENT: Positive for congestion. Negative for hearing loss, ear pain, rhinorrhea, tinnitus and ear discharge.   Eyes: Positive for visual disturbance (blurry vision). Negative for redness.  Respiratory: Negative for cough and shortness of breath.   Gastrointestinal: Negative for vomiting and abdominal pain.  All other systems reviewed and are  negative.    Allergies  Penicillins  Home Medications   Current Outpatient Rx  Name  Route  Sig  Dispense  Refill  . hydrALAZINE (APRESOLINE) 10 MG tablet   Oral   Take 10 mg by mouth 3 (three) times daily.         . potassium chloride (K-DUR,KLOR-CON) 10 MEQ tablet   Oral   Take 10 mEq by mouth daily.         [name] ALPRAZolam (XANAX) 0.5 MG tablet   Oral   Take 0.5 mg by mouth 2 (two) times daily.         [name] atenolol (TENORMIN) 50 MG tablet   Oral   Take 100 mg by mouth at bedtime.         [name] desonide (DESOWEN) 0.05 % cream   Topical   Apply 1 application topically 2 (two) times daily.         [name] HYDROcodone-acetaminophen (VICODIN) 5-500 MG per tablet   Oral   Take 1-2 tablets by mouth every 6 (six) hours as needed.         [name] ibuprofen (ADVIL,MOTRIN) 800 MG tablet   Oral   Take 800 mg by mouth every 8 (eight) hours as needed.         [name] losartan-hydrochlorothiazide (HYZAAR) 100-25 MG per tablet   Oral   Take 1 tablet by mouth daily.         [name]  Sulfacetamide Sodium-Sulfur (CLARIFOAM EF EX)   Apply externally   Apply 1 application topically daily.          BP 156/98  Pulse 85  Temp(Src) 97.5 F (36.4 C) (Oral)  Resp 16  SpO2 96%  LMP 12/04/2011 Physical Exam  Nursing note and vitals reviewed. Constitutional: She is oriented to person, place, and time. She appears well-developed and well-nourished. No distress.  HENT:  Head: Normocephalic and atraumatic.  Small skin lesion roughly half a centimeter on the right cheek. Roughly 3 cm below the lateral margin of the right eyelid. Some mild associated swelling and redness. No fluctuance. No induration. Patient has normal sensation over the right cheek and left cheek. She has normal extraocular movements. No concern for entrapment. No proptosis.  Eyes: EOM are normal. Pupils are equal, round, and reactive to light.  Neck: Normal range of motion. Neck supple.  Cardiovascular: Normal rate and regular  rhythm.  Exam reveals no friction rub.   No murmur heard. Pulmonary/Chest: Effort normal and breath sounds normal. No respiratory distress. She has no wheezes. She has no rales.  Abdominal: Soft. She exhibits no distension. There is no tenderness. There is no rebound.  Musculoskeletal: Normal range of motion. She exhibits no edema.  Neurological: She is alert and oriented to person, place, and time.  Skin: She is not diaphoretic.    ED Course   [place] bedside Date/Time: 06/25/2013 10:18 AM Performed by: Dagmar Hait Authorized by: Dagmar Hait Consent: Verbal consent obtained. Preparation: Patient was prepped and draped in the usual sterile fashion. Local anesthesia used: no Patient sedated: no Patient tolerance: Patient tolerated the procedure well with no immediate complications. Comments: Bedside ultrasound of the lesion shows extremely small fluid collection at the surface mild cellulitic changes around it..    (including critical care time)  Labs Reviewed - No data to display No results found. 1. Cellulitis of external cheek, right     MDM  42 year old female here with right face swelling. Had a wound that has worsened over the past 3 days. Unknown if possible insect bite, however patient believes this is to be the case. Right lateral superior cheek lesion. Very mild surrounding swelling erythema. Tender to touch. I. exam is normal. No concern for cellulitis of the orbit her preseptal cellulitis. She is very mild cellulitis at the site. Bedside ultrasound shows extremely small fluid collection at the surface. This fluid collection is not amenable to draining with a scalpel and mentioned patient does not want that anyway. Patient is not need a facial CT. She does have some sinus pain and headache, bleed that this is secondary to this lesion here. She states some blurry vision, visual acuity normal. We'll give bactrim and instructed to use warm compresses.   Dagmar Hait, MD 06/25/13 1032

## 2013-06-25 NOTE — ED Notes (Signed)
Here for right facial swelling, onset 3 days ago, painful at touch. Swelling noted over the right maxillary sinus. Associated symptoms are left blurry vision, right facial numbness, and a blister on right upper cheek. Denied other complaints otherwise. Only hx is HTN and Anxiety. Alert, oriented. Thoughts coherent. ABC Intact. In no acute distress.

## 2013-07-17 ENCOUNTER — Encounter: Payer: Self-pay | Admitting: Family Medicine

## 2013-07-17 ENCOUNTER — Ambulatory Visit (INDEPENDENT_AMBULATORY_CARE_PROVIDER_SITE_OTHER): Payer: 59 | Admitting: Family Medicine

## 2013-07-17 VITALS — BP 140/86 | HR 85 | Temp 98.4°F | Ht 64.0 in | Wt 306.0 lb

## 2013-07-17 DIAGNOSIS — H6981 Other specified disorders of Eustachian tube, right ear: Secondary | ICD-10-CM

## 2013-07-17 DIAGNOSIS — Z23 Encounter for immunization: Secondary | ICD-10-CM

## 2013-07-17 DIAGNOSIS — H6990 Unspecified Eustachian tube disorder, unspecified ear: Secondary | ICD-10-CM | POA: Insufficient documentation

## 2013-07-17 DIAGNOSIS — H6980 Other specified disorders of Eustachian tube, unspecified ear: Secondary | ICD-10-CM | POA: Insufficient documentation

## 2013-07-17 DIAGNOSIS — F341 Dysthymic disorder: Secondary | ICD-10-CM

## 2013-07-17 DIAGNOSIS — I10 Essential (primary) hypertension: Secondary | ICD-10-CM

## 2013-07-17 DIAGNOSIS — Z1331 Encounter for screening for depression: Secondary | ICD-10-CM

## 2013-07-17 DIAGNOSIS — F418 Other specified anxiety disorders: Secondary | ICD-10-CM | POA: Insufficient documentation

## 2013-07-17 DIAGNOSIS — Z6841 Body Mass Index (BMI) 40.0 and over, adult: Secondary | ICD-10-CM | POA: Insufficient documentation

## 2013-07-17 LAB — BASIC METABOLIC PANEL
Calcium: 9.4 mg/dL (ref 8.4–10.5)
Creatinine, Ser: 0.9 mg/dL (ref 0.4–1.2)

## 2013-07-17 LAB — HEPATIC FUNCTION PANEL
Bilirubin, Direct: 0 mg/dL (ref 0.0–0.3)
Total Bilirubin: 0.3 mg/dL (ref 0.3–1.2)
Total Protein: 7.7 g/dL (ref 6.0–8.3)

## 2013-07-17 LAB — LIPID PANEL
Cholesterol: 134 mg/dL (ref 0–200)
LDL Cholesterol: 75 mg/dL (ref 0–99)
Triglycerides: 80 mg/dL (ref 0.0–149.0)

## 2013-07-17 LAB — CBC WITH DIFFERENTIAL/PLATELET
Basophils Absolute: 0 10*3/uL (ref 0.0–0.1)
Eosinophils Absolute: 0.1 10*3/uL (ref 0.0–0.7)
HCT: 35.1 % — ABNORMAL LOW (ref 36.0–46.0)
Hemoglobin: 11.9 g/dL — ABNORMAL LOW (ref 12.0–15.0)
Lymphs Abs: 3.3 10*3/uL (ref 0.7–4.0)
MCHC: 33.9 g/dL (ref 30.0–36.0)
Monocytes Absolute: 0.6 10*3/uL (ref 0.1–1.0)
Neutro Abs: 4.5 10*3/uL (ref 1.4–7.7)
RDW: 15.9 % — ABNORMAL HIGH (ref 11.5–14.6)

## 2013-07-17 MED ORDER — CITALOPRAM HYDROBROMIDE 20 MG PO TABS: 20.0000 mg | ORAL_TABLET | Freq: Every day | ORAL | Status: DC

## 2013-07-17 MED ORDER — FLUTICASONE PROPIONATE 50 MCG/ACT NA SUSP: 2.0000 | Freq: Every day | NASAL | Status: DC

## 2013-07-17 MED ORDER — LOSARTAN POTASSIUM-HCTZ 100-25 MG PO TABS: 1.0000 | ORAL_TABLET | Freq: Every day | ORAL | Status: DC

## 2013-07-17 MED ORDER — ATENOLOL 50 MG PO TABS: 100.0000 mg | ORAL_TABLET | Freq: Every day | ORAL | Status: DC

## 2013-07-17 MED ORDER — SULFACETAMIDE SODIUM-SULFUR 10-5 % EX FOAM: 1.0000 "application " | Freq: Every day | CUTANEOUS | Status: DC

## 2013-07-17 NOTE — Assessment & Plan Note (Signed)
New.  Pt w/ obvious untreated nasal allergies and retracted R TM.  Start nasal steroid spray.  Reviewed supportive care and red flags that should prompt return.  Pt expressed understanding and is in agreement w/ plan.

## 2013-07-17 NOTE — Assessment & Plan Note (Signed)
Chronic problem.  Pt on odd regimen- having difficulty taking hydralazine TID.  Does have migraines so Atenolol makes some sense.  Continue the Hyzaar and the Atenolol and monitor for BP control off the Hydralazine.

## 2013-07-17 NOTE — Patient Instructions (Addendum)
Follow up in 1 month to recheck mood and BP START the celexa daily STOP the hydralazine Continue the losartan HCTZ and the Atenolol Start the Flonase- 2 sprays each nostril daily Continue to make healthy food choices and try and get regular exercise Call with any questions or concerns Welcome!  We're glad to have you! Hang in there!  You can do this!

## 2013-07-17 NOTE — Assessment & Plan Note (Signed)
New to provider.  Pt reports she has gained 70 lbs since moving to [place].  Due to depression, she is not exercising and is finding comfort in food.  Will tx depression but stressed that her weight poses the biggest health risk to her.  Check labs to risk stratify.  Will follow closely.

## 2013-07-17 NOTE — Assessment & Plan Note (Signed)
New to provider, chronic for pt.  Deteriorated.  Reports that using the xanax PRN is not helping w/ her depression.  Her depression is leading to her weight gain and also migraines.  Start daily SSRI and monitor for improvement.

## 2013-07-17 NOTE — Progress Notes (Signed)
  Subjective:    Patient ID: Vanessa Reeves, female    DOB: 01-26-1971, 42 y.o.   MRN: [id number]  HPI New to establish.  Previous MD- Lovell Sheehan  HTN- chronic problem, on Losartan-HCTZ, hydralazine TID, atenolol (also has migraines).  Pt reports today's BP is typical for her- takes her BP regularly at work.  Previous MD would take BP w/ Dynamap and readings were always much more elevated.  No CP, SOB, HAs above baseline, visual changes, edema.  Anxiety/depression- chronic problem, on Xanax but doesn't feel this is helping her sxs.  Pt moved here from [place] 5 yrs ago- missing family.  Stressed w/ work- adding hrs b/c daughter is now in Automotive engineer.  Had some marital problems.  Having decreased motivation, increased sleeping, fatigue.  Has stopped working out, no longer doing [employer].  R ear pain- ongoing issue, has seen doctor for this previously but has always been told 'there's nothing wrong'.  Pain is sharp, intermittent.  No drainage, no fevers.   Review of Systems For ROS see HPI     Objective:   Physical Exam  Vitals reviewed. Constitutional: She is oriented to person, place, and time. She appears well-developed and well-nourished. No distress.  Morbidly obese  HENT:  Head: Normocephalic and atraumatic.  Right Ear: Tympanic membrane is retracted.  Left Ear: Tympanic membrane normal.  Nose: Mucosal edema and rhinorrhea present. Right sinus exhibits no maxillary sinus tenderness and no frontal sinus tenderness. Left sinus exhibits no maxillary sinus tenderness and no frontal sinus tenderness.  Mouth/Throat: Mucous membranes are normal. Posterior oropharyngeal erythema (w/ PND) present.  Eyes: Conjunctivae and EOM are normal. Pupils are equal, round, and reactive to light.  Neck: Normal range of motion. Neck supple. No thyromegaly present.  Cardiovascular: Normal rate, regular rhythm, normal heart sounds and intact distal pulses.   No murmur heard. Pulmonary/Chest: Effort normal  and breath sounds normal. No respiratory distress. She has no wheezes. She has no rales.  Abdominal: Soft. She exhibits no distension. There is no tenderness.  Musculoskeletal: She exhibits no edema.  Lymphadenopathy:    She has no cervical adenopathy.  Neurological: She is alert and oriented to person, place, and time.  Skin: Skin is warm and dry.  Psychiatric: She has a normal mood and affect. Her behavior is normal.          Assessment & Plan:

## 2013-07-18 ENCOUNTER — Other Ambulatory Visit: Payer: Self-pay | Admitting: General Practice

## 2013-07-18 MED ORDER — POTASSIUM CHLORIDE CRYS ER 20 MEQ PO TBCR: 20.0000 meq | EXTENDED_RELEASE_TABLET | Freq: Every day | ORAL | Status: DC

## 2013-07-19 ENCOUNTER — Encounter: Payer: Self-pay | Admitting: Family Medicine

## 2013-07-19 ENCOUNTER — Other Ambulatory Visit: Payer: Self-pay | Admitting: General Practice

## 2013-07-19 MED ORDER — ALPRAZOLAM 0.5 MG PO TABS: 0.5000 mg | ORAL_TABLET | Freq: Two times a day (BID) | ORAL | Status: DC

## 2013-07-19 NOTE — Telephone Encounter (Signed)
Med filled and faxed.  

## 2013-07-19 NOTE — Telephone Encounter (Signed)
MyChart message: i forgot to tell tabori i need a refill on my xanax i have 4 days left of the pills and is it safe to take with the celexa   [facility] for the refill? And ok to take with Celexa?

## 2013-07-26 NOTE — Telephone Encounter (Signed)
Med called in today.

## 2013-08-08 DIAGNOSIS — Z0279 Encounter for issue of other medical certificate: Secondary | ICD-10-CM

## 2013-08-16 ENCOUNTER — Ambulatory Visit: Payer: 59 | Admitting: Family Medicine

## 2013-08-25 ENCOUNTER — Encounter: Payer: Self-pay | Admitting: Lab

## 2013-08-28 ENCOUNTER — Ambulatory Visit (INDEPENDENT_AMBULATORY_CARE_PROVIDER_SITE_OTHER): Payer: 59 | Admitting: Family Medicine

## 2013-08-28 ENCOUNTER — Other Ambulatory Visit: Payer: Self-pay | Admitting: General Practice

## 2013-08-28 ENCOUNTER — Encounter: Payer: Self-pay | Admitting: Family Medicine

## 2013-08-28 DIAGNOSIS — F418 Other specified anxiety disorders: Secondary | ICD-10-CM

## 2013-08-28 DIAGNOSIS — R7303 Prediabetes: Secondary | ICD-10-CM

## 2013-08-28 DIAGNOSIS — E119 Type 2 diabetes mellitus without complications: Secondary | ICD-10-CM | POA: Insufficient documentation

## 2013-08-28 DIAGNOSIS — E876 Hypokalemia: Secondary | ICD-10-CM | POA: Insufficient documentation

## 2013-08-28 DIAGNOSIS — R7309 Other abnormal glucose: Secondary | ICD-10-CM

## 2013-08-28 DIAGNOSIS — I10 Essential (primary) hypertension: Secondary | ICD-10-CM

## 2013-08-28 DIAGNOSIS — F341 Dysthymic disorder: Secondary | ICD-10-CM

## 2013-08-28 LAB — BASIC METABOLIC PANEL
CO2: 28 mEq/L (ref 19–32)
Calcium: 9.3 mg/dL (ref 8.4–10.5)
Creatinine, Ser: 1 mg/dL (ref 0.4–1.2)
Glucose, Bld: 95 mg/dL (ref 70–99)

## 2013-08-28 MED ORDER — ALPRAZOLAM 0.5 MG PO TABS: 0.5000 mg | ORAL_TABLET | Freq: Two times a day (BID) | ORAL | Status: DC

## 2013-08-28 NOTE — Progress Notes (Signed)
  Subjective:    Patient ID: Vanessa Reeves, female    DOB: 1971-03-09, 42 y.o.   MRN: [id number]  HPI HTN- chronic problem, well controlled on atenolol and Hyzaar.  No CP, SOB, HAs, visual changes, edema.  Depression/Anxiety- started celexa at last visit.  Feels mood is much improved.  Obesity- has lost 6 lbs since last visit.  Joined [employer]- has changed diet to fish/chicken.  Limiting sweets.  Hypokalemia- taking K+.  Due for repeat labs.   Review of Systems For ROS see HPI     Objective:   Physical Exam  Vitals reviewed. Constitutional: She is oriented to person, place, and time. She appears well-developed and well-nourished. No distress.  obese  HENT:  Head: Normocephalic and atraumatic.  Eyes: Conjunctivae and EOM are normal. Pupils are equal, round, and reactive to light.  Neck: Normal range of motion. Neck supple. No thyromegaly present.  Cardiovascular: Normal rate, regular rhythm, normal heart sounds and intact distal pulses.   No murmur heard. Pulmonary/Chest: Effort normal and breath sounds normal. No respiratory distress.  Abdominal: Soft. She exhibits no distension. There is no tenderness.  Musculoskeletal: She exhibits no edema.  Lymphadenopathy:    She has no cervical adenopathy.  Neurological: She is alert and oriented to person, place, and time.  Skin: Skin is warm and dry.  Psychiatric: She has a normal mood and affect. Her behavior is normal.          Assessment & Plan:

## 2013-08-28 NOTE — Patient Instructions (Signed)
Follow up in January to recheck your sugar We'll notify you of your lab results and make any changes if needed Keep up the good work!  I'm so proud of you! Remember, small changes add up to BIG results! Call with any questions or concerns Happy halloween!

## 2013-08-29 ENCOUNTER — Encounter: Payer: Self-pay | Admitting: Family Medicine

## 2013-08-29 NOTE — Assessment & Plan Note (Signed)
New.  Noted on last labs.  Pt has lost 6 lbs since last visit.  Has rejoined [employer].  Reports lab results were 'eye opening'.  Is committed to making change.  Applauded efforts.  Will follow.

## 2013-08-29 NOTE — Assessment & Plan Note (Signed)
Chronic problem.  Remains adequately controlled even after stopping Hydralazine at last visit.  No med changes.

## 2013-08-29 NOTE — Assessment & Plan Note (Signed)
Noted on last labs.  Started on K+ supplement last visit.  Recheck labs.

## 2013-08-29 NOTE — Assessment & Plan Note (Signed)
Pt has lost 6 lbs since last visit.  Applauded recent efforts.  Will continue to follow.

## 2013-08-29 NOTE — Assessment & Plan Note (Signed)
Improved since starting Celexa.  Will continue to follow.

## 2013-09-05 ENCOUNTER — Ambulatory Visit: Payer: 59 | Admitting: Family Medicine

## 2013-09-08 ENCOUNTER — Encounter: Payer: Self-pay | Admitting: Family Medicine

## 2013-09-08 ENCOUNTER — Telehealth: Payer: Self-pay | Admitting: *Deleted

## 2013-09-08 NOTE — Telephone Encounter (Signed)
Patient's FMLA forms completed and placed up front for fax and scanning

## 2013-09-08 NOTE — Telephone Encounter (Signed)
Patient is calling to check the status of FMLA forms. Please advise.

## 2013-09-11 ENCOUNTER — Encounter: Payer: Self-pay | Admitting: Family Medicine

## 2013-09-16 ENCOUNTER — Emergency Department (HOSPITAL_BASED_OUTPATIENT_CLINIC_OR_DEPARTMENT_OTHER): Payer: 59

## 2013-09-16 ENCOUNTER — Emergency Department (HOSPITAL_BASED_OUTPATIENT_CLINIC_OR_DEPARTMENT_OTHER)
Admission: EM | Admit: 2013-09-16 | Discharge: 2013-09-16 | Disposition: A | Discharge status: Home / Self Care | Payer: 59 | Attending: Emergency Medicine | Admitting: Emergency Medicine

## 2013-09-16 ENCOUNTER — Encounter (HOSPITAL_BASED_OUTPATIENT_CLINIC_OR_DEPARTMENT_OTHER): Payer: Self-pay | Admitting: Emergency Medicine

## 2013-09-16 DIAGNOSIS — F411 Generalized anxiety disorder: Secondary | ICD-10-CM | POA: Insufficient documentation

## 2013-09-16 DIAGNOSIS — I10 Essential (primary) hypertension: Secondary | ICD-10-CM | POA: Insufficient documentation

## 2013-09-16 DIAGNOSIS — Z79899 Other long term (current) drug therapy: Secondary | ICD-10-CM | POA: Insufficient documentation

## 2013-09-16 DIAGNOSIS — R509 Fever, unspecified: Secondary | ICD-10-CM | POA: Insufficient documentation

## 2013-09-16 DIAGNOSIS — Z87891 Personal history of nicotine dependence: Secondary | ICD-10-CM | POA: Insufficient documentation

## 2013-09-16 DIAGNOSIS — R51 Headache: Secondary | ICD-10-CM | POA: Insufficient documentation

## 2013-09-16 DIAGNOSIS — R0602 Shortness of breath: Secondary | ICD-10-CM | POA: Insufficient documentation

## 2013-09-16 DIAGNOSIS — R5381 Other malaise: Secondary | ICD-10-CM | POA: Insufficient documentation

## 2013-09-16 DIAGNOSIS — IMO0002 Reserved for concepts with insufficient information to code with codable children: Secondary | ICD-10-CM | POA: Insufficient documentation

## 2013-09-16 DIAGNOSIS — R42 Dizziness and giddiness: Secondary | ICD-10-CM | POA: Insufficient documentation

## 2013-09-16 DIAGNOSIS — E669 Obesity, unspecified: Secondary | ICD-10-CM | POA: Insufficient documentation

## 2013-09-16 DIAGNOSIS — Z880 Allergy status to penicillin: Secondary | ICD-10-CM | POA: Insufficient documentation

## 2013-09-16 LAB — CBC WITH DIFFERENTIAL/PLATELET
Basophils Absolute: 0 10*3/uL (ref 0.0–0.1)
Eosinophils Absolute: 0.2 10*3/uL (ref 0.0–0.7)
Eosinophils Relative: 2 % (ref 0–5)
Hemoglobin: 12 g/dL (ref 12.0–15.0)
Lymphocytes Relative: 31 % (ref 12–46)
Lymphs Abs: 3.2 10*3/uL (ref 0.7–4.0)
MCV: 86.8 fL (ref 78.0–100.0)
Neutrophils Relative %: 60 % (ref 43–77)
Platelets: 376 10*3/uL (ref 150–400)
RDW: 14.8 % (ref 11.5–15.5)
WBC: 10.2 10*3/uL (ref 4.0–10.5)

## 2013-09-16 LAB — GLUCOSE, CAPILLARY

## 2013-09-16 LAB — BASIC METABOLIC PANEL
BUN: 15 mg/dL (ref 6–23)
CO2: 26 mEq/L (ref 19–32)
Calcium: 9.6 mg/dL (ref 8.4–10.5)
Creatinine, Ser: 1 mg/dL (ref 0.50–1.10)
GFR calc non Af Amer: 69 mL/min — ABNORMAL LOW (ref >90)
Sodium: 135 mEq/L (ref 135–145)

## 2013-09-16 MED ORDER — KETOROLAC TROMETHAMINE 60 MG/2ML IM SOLN
INTRAMUSCULAR | Status: AC
  Filled 2013-09-16: qty 2

## 2013-09-16 MED ORDER — SODIUM CHLORIDE 0.9 % IV BOLUS (SEPSIS): 1000.0000 mL | Freq: Once | INTRAVENOUS | Status: DC

## 2013-09-16 MED ORDER — KETOROLAC TROMETHAMINE 60 MG/2ML IM SOLN
60.0000 mg | Freq: Once | INTRAMUSCULAR | Status: AC
  Administered 2013-09-16: 60 mg via INTRAMUSCULAR

## 2013-09-16 MED ORDER — KETOROLAC TROMETHAMINE 30 MG/ML IJ SOLN
30.0000 mg | Freq: Once | INTRAMUSCULAR | Status: DC
  Filled 2013-09-16: qty 1

## 2013-09-16 NOTE — ED Provider Notes (Signed)
CSN: [id number]     Arrival date & time 09/16/13  0815 History   First MD Initiated Contact with Patient 09/16/13 [phone]     Chief Complaint  Patient presents with  . Fatigue  . Dizziness  . Headache   (Consider location/radiation/quality/duration/timing/severity/associated sxs/prior Treatment) Patient is a 42 y.o. female presenting with headaches, shortness of breath, and neurologic complaint. The history is provided by the patient. No language interpreter was used.  Headache Pain location:  Frontal Quality: throbbing. Radiates to:  Does not radiate Pain severity now: moderate. Pain scale at highest: moderate. Onset quality:  Gradual Duration:  10 hours Timing:  Constant Progression:  Unchanged Chronicity:  New Relieved by:  Nothing Worsened by:  Nothing tried Ineffective treatments:  None tried Associated symptoms: dizziness and fever   Associated symptoms: no abdominal pain, no back pain, no congestion, no cough, no diarrhea, no fatigue, no nausea, no neck pain, no neck stiffness, no numbness, no photophobia, no sore throat and no vomiting   Shortness of Breath Severity:  Mild Onset quality:  Unable to specify Duration:  3 days Timing:  Intermittent Progression:  Waxing and waning Chronicity:  New Relieved by:  Nothing Worsened by:  Nothing tried Ineffective treatments:  None tried Associated symptoms: fever and headaches   Associated symptoms: no abdominal pain, no chest pain, no cough, no diaphoresis, no neck pain, no sore throat and no vomiting   Neurologic Problem This is a new (dizziness described as room spinning) problem. The current episode started more than 2 days ago. Episode frequency: intermittent. The problem has not changed since onset.Associated symptoms include headaches and shortness of breath. Pertinent negatives include no chest pain and no abdominal pain. Nothing aggravates the symptoms. Nothing relieves the symptoms. She has tried nothing for the  symptoms. The treatment provided no relief.    Past Medical History  Diagnosis Date  . Hypertension   . Anxiety    Past Surgical History  Procedure Laterality Date  . Tubal ligation    . Dilation and curettage of uterus    . Abdominal hysterectomy     Family History  Problem Relation Age of Onset  . Other Neg Hx    History  Substance Use Topics  . Smoking status: Former Games developer  . Smokeless tobacco: Not on file  . Alcohol Use: Yes     Comment: socially   OB History   Grav Para Term Preterm Abortions TAB SAB Ect Mult Living   5 3 2 1 2 1 1         Review of Systems  Constitutional: Positive for fever. Negative for chills, diaphoresis, activity change, appetite change and fatigue.  HENT: Negative for congestion, facial swelling, rhinorrhea and sore throat.   Eyes: Negative for photophobia and discharge.  Respiratory: Positive for shortness of breath. Negative for cough and chest tightness.   Cardiovascular: Negative for chest pain, palpitations and leg swelling.  Gastrointestinal: Negative for nausea, vomiting, abdominal pain and diarrhea.  Endocrine: Negative for polydipsia and polyuria.  Genitourinary: Negative for dysuria, frequency, difficulty urinating and pelvic pain.  Musculoskeletal: Negative for arthralgias, back pain, neck pain and neck stiffness.  Skin: Negative for color change and wound.  Allergic/Immunologic: Negative for immunocompromised state.  Neurological: Positive for dizziness and headaches. Negative for facial asymmetry, weakness and numbness.  Hematological: Does not bruise/bleed easily.  Psychiatric/Behavioral: Negative for confusion and agitation.    Allergies  Penicillins  Home Medications   Current Outpatient Rx  Name  Route  Sig  Dispense  Refill  . ALPRAZolam (XANAX) 0.5 MG tablet   Oral   Take 1 tablet (0.5 mg total) by mouth 2 (two) times daily.   60 tablet   1   . atenolol (TENORMIN) 50 MG tablet   Oral   Take 2 tablets (100 mg  total) by mouth at bedtime.   60 tablet   3   . citalopram (CELEXA) 20 MG tablet   Oral   Take 1 tablet (20 mg total) by mouth daily.   30 tablet   3   . desonide (DESOWEN) 0.05 % cream   Topical   Apply 1 application topically 2 (two) times daily.         . fluticasone (FLONASE) 50 MCG/ACT nasal spray   Nasal   Place 2 sprays into the nose daily.   16 g   6   . HYDROcodone-acetaminophen (VICODIN) 5-500 MG per tablet   Oral   Take 1-2 tablets by mouth every 6 (six) hours as needed.         [name] ibuprofen (ADVIL,MOTRIN) 800 MG tablet   Oral   Take 800 mg by mouth every 8 (eight) hours as needed.         [name] losartan-hydrochlorothiazide (HYZAAR) 100-25 MG per tablet   Oral   Take 1 tablet by mouth daily.   30 tablet   3   . potassium chloride SA (K-DUR,KLOR-CON) 20 MEQ tablet   Oral   Take 1 tablet (20 mEq total) by mouth daily.   30 tablet   3   . Sulfacetamide Sodium-Sulfur (CLARIFOAM EF) 10-5 % FOAM   Topical   Apply 1 application topically daily.   100 g   3    BP 159/95  Pulse 97  Temp(Src) 98.2 F (36.8 C) (Oral)  Resp 22  Ht 5\' 4"  (1.626 m)  Wt 300 lb (136.079 kg)  BMI 51.47 kg/m2  SpO2 100%  LMP 12/04/2011 Physical Exam  Vitals reviewed. Constitutional: She is oriented to person, place, and time. She appears well-developed and well-nourished. No distress.  Obese   HENT:  Head: Normocephalic and atraumatic.  Mouth/Throat: No oropharyngeal exudate.  Eyes: Pupils are equal, round, and reactive to light.  Neck: Normal range of motion. Neck supple.  Cardiovascular: Normal rate, regular rhythm and normal heart sounds.  Exam reveals no gallop and no friction rub.   No murmur heard. Hypertensive   Pulmonary/Chest: Effort normal and breath sounds normal. No respiratory distress. She has no wheezes. She has no rales.  Abdominal: Soft. Bowel sounds are normal. She exhibits no distension and no mass. There is no tenderness. There is no rebound and no  guarding.  Musculoskeletal: Normal range of motion. She exhibits no edema and no tenderness.  Neurological: She is alert and oriented to person, place, and time. She has normal strength. She displays no tremor. No cranial nerve deficit or sensory deficit. She exhibits normal muscle tone. She displays a negative Romberg sign. Coordination and gait normal. GCS eye subscore is 4. GCS verbal subscore is 5. GCS motor subscore is 6.  Skin: Skin is warm and dry.  Psychiatric: She has a normal mood and affect.    ED Course  Procedures (including critical care time) Labs Review Labs Reviewed  BASIC METABOLIC PANEL - Abnormal; Notable for the following:    Glucose, Bld 112 (*)    GFR calc non Af Amer 69 (*)    GFR calc Af Amer 81 (*)  All other components within normal limits  GLUCOSE, CAPILLARY  CBC WITH DIFFERENTIAL   Imaging Review Dg Chest 2 View  09/16/2013   CLINICAL DATA:  DIZZINESS, WEAKNESS, HEADACHE, HYPERTENSION, DYSPNEA  EXAM: CHEST  2 VIEW  COMPARISON:  None.  FINDINGS: The heart size and mediastinal contours are within normal limits. Both lungs are clear. The visualized skeletal structures are unremarkable.  IMPRESSION: No active cardiopulmonary disease.   Electronically Signed   By: Ruel Favors M.D.   On: 09/16/2013 09:52    EKG Interpretation   None       MDM   1. Dizziness, nonspecific    Pt is a 42 y.o. female with Pmhx as above who presents with several days of fatigue, intermittent non-positional dizziness described as room spinning, and SOB.  No fever, chills, cough, CP, ab pain, vomiting, urinary symptoms. She has had some nausea, d/a x2 yesterday.   H/a started last PM, frontal throbbing w/o assoc symptoms. Has not taken anything for h/a. Recently started wt watchers, was diagnoses w/ pre-diabetes recently.  On PE, VSS, pt in NAD.  No focal neuro findings, no difficult ambulating.  Cardiopulm exam benign. No LE edema or TTP, 100% on RA.  I doubt CVA/TIA as  dizziness intermittent, not severe, has had no difficulty ambulating & has no neuro findings on exam.  She does have small effusion behind R TM and was placed on flonase by PCP this week, which may be cause of symptoms.  I do not feel SOB needs further ED w/u given she is 100% on RA, breathing comfortable, has nml CXR, and is PERC/Well's negative.  CBC, BMP unremarkable as well.  Will recommend she drink plenty of fluids, f/u early next week w/ PCP.      Shanna Cisco, MD 09/16/13 1017

## 2013-09-16 NOTE — ED Notes (Signed)
Pt having dizziness, lethargy and headache for several days.  Pt recently taken off one of her BP medications in September.

## 2013-10-12 ENCOUNTER — Encounter: Payer: Self-pay | Admitting: Family Medicine

## 2013-11-02 LAB — HM DIABETES EYE EXAM

## 2013-11-06 ENCOUNTER — Other Ambulatory Visit: Payer: Self-pay | Admitting: Family Medicine

## 2013-11-06 NOTE — Telephone Encounter (Signed)
Last seen-08/28/2013  Last filled-08/28/2013  UDS-08/28/2013 low risk, contract signed   Please advise. SW

## 2013-11-29 ENCOUNTER — Ambulatory Visit: Payer: 59 | Admitting: Family Medicine

## 2013-12-13 ENCOUNTER — Other Ambulatory Visit: Payer: Self-pay | Admitting: Family Medicine

## 2013-12-13 NOTE — Telephone Encounter (Signed)
Med filled.  

## 2013-12-29 ENCOUNTER — Encounter: Payer: Self-pay | Admitting: Family Medicine

## 2013-12-29 ENCOUNTER — Ambulatory Visit (INDEPENDENT_AMBULATORY_CARE_PROVIDER_SITE_OTHER): Payer: 59 | Admitting: Family Medicine

## 2013-12-29 VITALS — BP 160/88 | HR 57 | Temp 98.2°F | Resp 16 | Wt 302.1 lb

## 2013-12-29 DIAGNOSIS — I10 Essential (primary) hypertension: Secondary | ICD-10-CM

## 2013-12-29 DIAGNOSIS — E119 Type 2 diabetes mellitus without complications: Secondary | ICD-10-CM

## 2013-12-29 DIAGNOSIS — F341 Dysthymic disorder: Secondary | ICD-10-CM

## 2013-12-29 DIAGNOSIS — R079 Chest pain, unspecified: Secondary | ICD-10-CM | POA: Insufficient documentation

## 2013-12-29 DIAGNOSIS — F418 Other specified anxiety disorders: Secondary | ICD-10-CM

## 2013-12-29 LAB — CBC WITH DIFFERENTIAL/PLATELET
BASOS PCT: 0.3 % (ref 0.0–3.0)
Basophils Absolute: 0 10*3/uL (ref 0.0–0.1)
Eosinophils Absolute: 0.1 10*3/uL (ref 0.0–0.7)
Eosinophils Relative: 1.2 % (ref 0.0–5.0)
HEMATOCRIT: 36.8 % (ref 36.0–46.0)
HEMOGLOBIN: 11.8 g/dL — AB (ref 12.0–15.0)
LYMPHS ABS: 2.9 10*3/uL (ref 0.7–4.0)
Lymphocytes Relative: 39.3 % (ref 12.0–46.0)
MCHC: 32 g/dL (ref 30.0–36.0)
MCV: 87.9 fl (ref 78.0–100.0)
MONO ABS: 0.5 10*3/uL (ref 0.1–1.0)
Monocytes Relative: 7.5 % (ref 3.0–12.0)
Neutro Abs: 3.8 10*3/uL (ref 1.4–7.7)
Neutrophils Relative %: 51.7 % (ref 43.0–77.0)
Platelets: 395 10*3/uL (ref 150.0–400.0)
RBC: 4.18 Mil/uL (ref 3.87–5.11)
RDW: 14.9 % — ABNORMAL HIGH (ref 11.5–14.6)
WBC: 7.3 10*3/uL (ref 4.5–10.5)

## 2013-12-29 LAB — TSH: TSH: 0.7 u[IU]/mL (ref 0.35–5.50)

## 2013-12-29 LAB — BASIC METABOLIC PANEL
BUN: 11 mg/dL (ref 6–23)
CALCIUM: 9.2 mg/dL (ref 8.4–10.5)
CO2: 29 mEq/L (ref 19–32)
Chloride: 103 mEq/L (ref 96–112)
Creatinine, Ser: 0.9 mg/dL (ref 0.4–1.2)
GFR: 84.32 mL/min (ref >60.00)
GLUCOSE: 89 mg/dL (ref 70–99)
Potassium: 3.4 mEq/L — ABNORMAL LOW (ref 3.5–5.1)
Sodium: 136 mEq/L (ref 135–145)

## 2013-12-29 LAB — HEMOGLOBIN A1C: Hgb A1c MFr Bld: 6.4 % (ref 4.6–6.5)

## 2013-12-29 MED ORDER — FLUTICASONE PROPIONATE 50 MCG/ACT NA SUSP: 2.0000 | Freq: Every day | NASAL | Status: DC

## 2013-12-29 MED ORDER — POTASSIUM CHLORIDE CRYS ER 20 MEQ PO TBCR: 20.0000 meq | EXTENDED_RELEASE_TABLET | Freq: Every day | ORAL | Status: DC

## 2013-12-29 MED ORDER — CITALOPRAM HYDROBROMIDE 20 MG PO TABS: 20.0000 mg | ORAL_TABLET | Freq: Every day | ORAL | Status: DC

## 2013-12-29 MED ORDER — LOSARTAN POTASSIUM-HCTZ 100-25 MG PO TABS: ORAL_TABLET | ORAL | Status: DC

## 2013-12-29 MED ORDER — ATENOLOL 50 MG PO TABS
ORAL_TABLET | ORAL | Status: DC
Start: 2013-12-29 — End: 2014-10-30

## 2013-12-29 MED ORDER — ALPRAZOLAM 0.5 MG PO TABS: ORAL_TABLET | ORAL | Status: DC

## 2013-12-29 NOTE — Progress Notes (Signed)
   Subjective:    Patient ID: Vanessa Reeves, female    DOB: September 27, 1971, 43 y.o.   MRN: [id number]  HPI Depression/anxiety- pt felt like she was doing better so stopped her meds.  Started school and has been overwhelmed.  Not sleeping, having chest pain, fatigue, sensation of 'world closing in'.  sxs started 'past couple of weeks' but 'last weekend was the worst'.  HTN- chronic problem, on Atenolol and Losartan HCTZ.  Reports taking meds regularly.  Is now having CP, SOB, HAs,  Diabetes- pt's A1C was 6.5 in Sept.  Due for f/u.  Has not lost weight.   Review of Systems For ROS see HPI     Objective:   Physical Exam  Vitals reviewed. Constitutional: She is oriented to person, place, and time. She appears well-developed and well-nourished. No distress.  obese  HENT:  Head: Normocephalic and atraumatic.  Eyes: Conjunctivae and EOM are normal. Pupils are equal, round, and reactive to light.  Neck: Normal range of motion. Neck supple. No thyromegaly present.  Cardiovascular: Normal rate, regular rhythm, normal heart sounds and intact distal pulses.   No murmur heard. Pulmonary/Chest: Effort normal and breath sounds normal. No respiratory distress.  Abdominal: Soft. She exhibits no distension. There is no tenderness.  Musculoskeletal: She exhibits no edema.  Lymphadenopathy:    She has no cervical adenopathy.  Neurological: She is alert and oriented to person, place, and time.  Skin: Skin is warm and dry.  Psychiatric: She has a normal mood and affect. Her behavior is normal.          Assessment & Plan:

## 2013-12-29 NOTE — Patient Instructions (Addendum)
Follow up in 3-4 weeks to recheck mood and other symptoms We'll notify you of your lab results Restart the Celexa daily and the xanax as needed Try and find a stress outlet- it's not good to keep this all in Talk to your advisor at school or the head of the department Call with any questions or concerns- particularly if symptoms change Hang in there!!!

## 2013-12-29 NOTE — Progress Notes (Signed)
Pre visit review using our clinic review tool, if applicable. No additional management support is needed unless otherwise documented below in the visit note. 

## 2013-12-31 NOTE — Assessment & Plan Note (Signed)
dx'd at last visit.  Due for repeat labs.  Pt has not lost weight and is not following low carb diet or getting exercise.  Will continue to follow.

## 2013-12-31 NOTE — Assessment & Plan Note (Signed)
New.  Suspect this is due to pt's ongoing anxiety/depression- pt now having panic attacks.  EKG WNL.  Will attempt to control depression/anxiety and monitor closely for symptom improvement.

## 2013-12-31 NOTE — Assessment & Plan Note (Signed)
Deteriorated.  BP is elevated today.  Pt having CP and SOB but suspect this is due to pt's panic attacks.  EKG WNL.  Pt reports taking meds regularly.  Suspect BP is directly related to stress levels.  Will follow.

## 2013-12-31 NOTE — Assessment & Plan Note (Signed)
Deteriorated.  Pt stopped all meds b/c mood had dramatically improved.  Restart meds.  Encouraged counseling.  Will follow.

## 2014-01-01 ENCOUNTER — Telehealth: Payer: Self-pay | Admitting: Family Medicine

## 2014-01-01 NOTE — Telephone Encounter (Signed)
Relevant patient education assigned to patient using Emmi. ° °

## 2014-01-02 ENCOUNTER — Telehealth: Payer: Self-pay

## 2014-01-02 NOTE — Telephone Encounter (Signed)
Relevant patient education assigned to patient using Emmi. ° °

## 2014-01-26 ENCOUNTER — Encounter: Payer: Self-pay | Admitting: Family Medicine

## 2014-01-26 ENCOUNTER — Ambulatory Visit (INDEPENDENT_AMBULATORY_CARE_PROVIDER_SITE_OTHER): Payer: 59 | Admitting: Family Medicine

## 2014-01-26 VITALS — BP 130/82 | HR 59 | Temp 98.0°F | Resp 16 | Wt 300.4 lb

## 2014-01-26 DIAGNOSIS — F418 Other specified anxiety disorders: Secondary | ICD-10-CM

## 2014-01-26 DIAGNOSIS — F341 Dysthymic disorder: Secondary | ICD-10-CM

## 2014-01-26 DIAGNOSIS — I10 Essential (primary) hypertension: Secondary | ICD-10-CM

## 2014-01-26 MED ORDER — LORCASERIN HCL 10 MG PO TABS: 1.0000 | ORAL_TABLET | Freq: Two times a day (BID) | ORAL | Status: DC

## 2014-01-26 MED ORDER — CITALOPRAM HYDROBROMIDE 40 MG PO TABS: 40.0000 mg | ORAL_TABLET | Freq: Every day | ORAL | Status: DC

## 2014-01-26 NOTE — Patient Instructions (Signed)
Follow up in 3 months to recheck weight loss progress Start the Belviq twice daily Increase the celexa to 40mg - 2 of what you have at home and 1 of the new script Healthy food choices and regular exercise! Hang in there!!

## 2014-01-26 NOTE — Assessment & Plan Note (Addendum)
Ongoing problem.  Reviewed importance of healthy diet, regular exercise.  Pt to download MyFitnessPal app and monitor intake.  Also discussed need for regular exercise.  Start Belviq.  Reviewed supportive care and red flags that should prompt return.  Pt expressed understanding and is in agreement w/ plan.

## 2014-01-26 NOTE — Progress Notes (Signed)
   Subjective:    Patient ID: Vanessa Reeves, female    DOB: [birth year], 43 y.o.   MRN: [id number]  HPI Depression/anxiety- chronic problem, restarted Celexa ~1 month ago.  Pt feels that mood is improving.  Interested in increasing medication.  Difficulty falling asleep due to difficult work/school/mom schedule.  Obesity- last lost 2 lbs since last visit.  BP is much better today than last visit.  Interested in starting [place].   Review of Systems For ROS see HPI     Objective:   Physical Exam  Vitals reviewed. Constitutional: She is oriented to person, place, and time. She appears well-developed and well-nourished. No distress.  Morbidly obese  HENT:  Head: Normocephalic and atraumatic.  Neck: Normal range of motion. Neck supple. No thyromegaly present.  Cardiovascular: Normal rate, regular rhythm and normal heart sounds.   Pulmonary/Chest: Effort normal and breath sounds normal. No respiratory distress. She has no wheezes. She has no rales.  Lymphadenopathy:    She has no cervical adenopathy.  Neurological: She is alert and oriented to person, place, and time.  Skin: Skin is warm and dry.  Psychiatric: She has a normal mood and affect. Her behavior is normal. Thought content normal.          Assessment & Plan:

## 2014-01-26 NOTE — Progress Notes (Signed)
Pre visit review using our clinic review tool, if applicable. No additional management support is needed unless otherwise documented below in the visit note. 

## 2014-01-28 NOTE — Assessment & Plan Note (Signed)
Unchanged.  Increase to 40mg  daily.  Monitor for improvement.

## 2014-01-28 NOTE — Assessment & Plan Note (Signed)
Chronic problem.  BP much improved today.  Asymptomatic.  Ok to start [employer]

## 2014-02-10 ENCOUNTER — Encounter (HOSPITAL_BASED_OUTPATIENT_CLINIC_OR_DEPARTMENT_OTHER): Payer: Self-pay | Admitting: Emergency Medicine

## 2014-02-10 ENCOUNTER — Emergency Department (HOSPITAL_BASED_OUTPATIENT_CLINIC_OR_DEPARTMENT_OTHER)
Admission: EM | Admit: 2014-02-10 | Discharge: 2014-02-11 | Disposition: A | Discharge status: Home / Self Care | Payer: 59 | Attending: Emergency Medicine | Admitting: Emergency Medicine

## 2014-02-10 DIAGNOSIS — IMO0002 Reserved for concepts with insufficient information to code with codable children: Secondary | ICD-10-CM | POA: Insufficient documentation

## 2014-02-10 DIAGNOSIS — Z791 Long term (current) use of non-steroidal anti-inflammatories (NSAID): Secondary | ICD-10-CM | POA: Insufficient documentation

## 2014-02-10 DIAGNOSIS — I10 Essential (primary) hypertension: Secondary | ICD-10-CM | POA: Insufficient documentation

## 2014-02-10 DIAGNOSIS — F411 Generalized anxiety disorder: Secondary | ICD-10-CM | POA: Insufficient documentation

## 2014-02-10 DIAGNOSIS — Z880 Allergy status to penicillin: Secondary | ICD-10-CM | POA: Insufficient documentation

## 2014-02-10 DIAGNOSIS — Z79899 Other long term (current) drug therapy: Secondary | ICD-10-CM | POA: Insufficient documentation

## 2014-02-10 DIAGNOSIS — N6489 Other specified disorders of breast: Secondary | ICD-10-CM | POA: Insufficient documentation

## 2014-02-10 DIAGNOSIS — N649 Disorder of breast, unspecified: Secondary | ICD-10-CM

## 2014-02-10 DIAGNOSIS — Z87891 Personal history of nicotine dependence: Secondary | ICD-10-CM | POA: Insufficient documentation

## 2014-02-10 DIAGNOSIS — Z792 Long term (current) use of antibiotics: Secondary | ICD-10-CM | POA: Insufficient documentation

## 2014-02-10 NOTE — ED Notes (Signed)
Lump in left breast x 2 weeks- more swollen x 1 day

## 2014-02-11 ENCOUNTER — Encounter (HOSPITAL_BASED_OUTPATIENT_CLINIC_OR_DEPARTMENT_OTHER): Payer: Self-pay | Admitting: Emergency Medicine

## 2014-02-11 MED ORDER — IBUPROFEN 800 MG PO TABS: 800.0000 mg | ORAL_TABLET | Freq: Three times a day (TID) | ORAL | Status: DC

## 2014-02-11 NOTE — ED Provider Notes (Signed)
CSN: [id number]     Arrival date & time 02/10/14  2258 History   First MD Initiated Contact with Patient 02/10/14 2355     Chief Complaint  Patient presents with  . Breast Mass     (Consider location/radiation/quality/duration/timing/severity/associated sxs/prior Treatment) The history is provided by the patient.  Found lesion in her left breast 2 weeks ago.  Now enlarged and tender.  No discharge, no nipple retraction no rashes of the skin.  No mammograms in the past.     Past Medical History  Diagnosis Date  . Hypertension   . Anxiety    Past Surgical History  Procedure Laterality Date  . Tubal ligation    . Dilation and curettage of uterus    . Abdominal hysterectomy     Family History  Problem Relation Age of Onset  . Other Neg Hx    History  Substance Use Topics  . Smoking status: Former Research scientist (life sciences)  . Smokeless tobacco: Not on file  . Alcohol Use: Yes     Comment: socially   OB History   Grav Para Term Preterm Abortions TAB SAB Ect Mult Living   5 3 2 1 2 1 1         Review of Systems  Constitutional: Negative for fever.  Cardiovascular: Negative for chest pain.  Skin: Negative for rash and wound.  All other systems reviewed and are negative.     Allergies  Penicillins  Home Medications   Current Outpatient Rx  Name  Route  Sig  Dispense  Refill  . ALPRAZolam (XANAX) 0.5 MG tablet      TAKE 1 TABLET BY MOUTH TWICE DAILY   60 tablet   1   . atenolol (TENORMIN) 50 MG tablet      TAKE 2 TABLETS BY MOUTH AT BEDTIME.   60 tablet   3   . citalopram (CELEXA) 40 MG tablet   Oral   Take 1 tablet (40 mg total) by mouth daily.   30 tablet   3   . desonide (DESOWEN) 0.05 % cream   Topical   Apply 1 application topically 2 (two) times daily.         . fluticasone (FLONASE) 50 MCG/ACT nasal spray   Each Nare   Place 2 sprays into both nostrils daily.   16 g   6   . HYDROcodone-acetaminophen (VICODIN) 5-500 MG per tablet   Oral   Take 1-2  tablets by mouth every 6 (six) hours as needed.         [name] ibuprofen (ADVIL,MOTRIN) 800 MG tablet   Oral   Take 800 mg by mouth every 8 (eight) hours as needed.         [name] ibuprofen (ADVIL,MOTRIN) 800 MG tablet   Oral   Take 1 tablet (800 mg total) by mouth 3 (three) times daily.   21 tablet   0   . Lorcaserin HCl (BELVIQ) 10 MG TABS   Oral   Take 1 tablet by mouth 2 (two) times daily.   60 tablet   3   . losartan-hydrochlorothiazide (HYZAAR) 100-25 MG per tablet      TAKE 1 TABLET BY MOUTH DAILY.   30 tablet   3   . potassium chloride SA (K-DUR,KLOR-CON) 20 MEQ tablet   Oral   Take 1 tablet (20 mEq total) by mouth daily.   30 tablet   3   . Sulfacetamide Sodium-Sulfur (CLARIFOAM EF) 10-5 % FOAM   Topical  Apply 1 application topically daily.   100 g   3    BP 149/88  Pulse 92  Temp(Src) 98.9 F (37.2 C) (Oral)  Resp 20  SpO2 100%  LMP 12/04/2011 Physical Exam  Constitutional: She is oriented to person, place, and time. She appears well-developed and well-nourished. No distress.  HENT:  Head: Normocephalic and atraumatic.  Mouth/Throat: Oropharynx is clear and moist.  Eyes: Conjunctivae are normal. Pupils are equal, round, and reactive to light.  Neck: Normal range of motion. Neck supple. No tracheal deviation present.  Cardiovascular: Normal rate, regular rhythm and intact distal pulses.   Pulmonary/Chest: Effort normal and breath sounds normal. No stridor. She has no wheezes. She has no rales. Right breast exhibits no inverted nipple, no mass, no nipple discharge and no skin change. Left breast exhibits mass and tenderness. Left breast exhibits no inverted nipple, no nipple discharge and no skin change. Breasts are symmetrical.    Abdominal: Soft. Bowel sounds are normal. There is no tenderness. There is no rebound and no guarding.  Musculoskeletal: Normal range of motion.  Lymphadenopathy:    She has no cervical adenopathy.  Neurological: She is alert  and oriented to person, place, and time.  Skin: Skin is warm and dry. No erythema.  Psychiatric: She has a normal mood and affect.    ED Course  Procedures (including critical care time) Labs Review Labs Reviewed - No data to display Imaging Review No results found.   EKG Interpretation None      MDM   Final diagnoses:  Breast lesion    Suspect fibrocyst but needs mammography.  Will refer to breast center for immediate diagnostic mammogram and close follow up.  Warm compresses no caffeine.  Ibuprofen on a full stomach patient verbalizes understanding and agrees to follow up    Marticia Reifschneider K Clarke Amburn-Rasch, MD 02/11/14 8546

## 2014-02-12 ENCOUNTER — Telehealth: Payer: Self-pay | Admitting: Family Medicine

## 2014-02-12 ENCOUNTER — Other Ambulatory Visit: Payer: Self-pay | Admitting: Obstetrics and Gynecology

## 2014-02-12 NOTE — Telephone Encounter (Signed)
4.13.15  Pt was seen at an Urgent Care and was told to go the Breast Cancer to have a large lump evaluated.  Pt tried to make apt, but was told that they needed a referral.  Pt would like a referral.  Please advise.

## 2014-02-12 NOTE — Telephone Encounter (Signed)
Left message for call back Non-identifiable   Patient needs an appointment scheduled as Dr. Birdie Riddle has not seen the patient for this issue.

## 2014-02-12 NOTE — Telephone Encounter (Signed)
Patient returned call. Appointment was scheduled for Friday, April 17/2015 at 4 pm. JG//CMA

## 2014-02-16 ENCOUNTER — Other Ambulatory Visit: Payer: Self-pay | Admitting: General Practice

## 2014-02-16 ENCOUNTER — Ambulatory Visit: Payer: 59 | Admitting: Family Medicine

## 2014-02-16 ENCOUNTER — Ambulatory Visit (INDEPENDENT_AMBULATORY_CARE_PROVIDER_SITE_OTHER): Payer: 59 | Admitting: Family Medicine

## 2014-02-16 ENCOUNTER — Other Ambulatory Visit: Payer: Self-pay | Admitting: Family Medicine

## 2014-02-16 ENCOUNTER — Encounter: Payer: Self-pay | Admitting: Family Medicine

## 2014-02-16 VITALS — BP 126/84 | HR 76 | Temp 98.6°F | Resp 16 | Wt 309.5 lb

## 2014-02-16 DIAGNOSIS — N63 Unspecified lump in unspecified breast: Secondary | ICD-10-CM

## 2014-02-16 DIAGNOSIS — N6320 Unspecified lump in the left breast, unspecified quadrant: Secondary | ICD-10-CM

## 2014-02-16 DIAGNOSIS — R609 Edema, unspecified: Secondary | ICD-10-CM

## 2014-02-16 LAB — CBC WITH DIFFERENTIAL/PLATELET
BASOS ABS: 0 10*3/uL (ref 0.0–0.1)
Basophils Relative: 0 % (ref 0–1)
EOS ABS: 0.2 10*3/uL (ref 0.0–0.7)
EOS PCT: 2 % (ref 0–5)
HCT: 34.1 % — ABNORMAL LOW (ref 36.0–46.0)
Hemoglobin: 11.1 g/dL — ABNORMAL LOW (ref 12.0–15.0)
LYMPHS PCT: 36 % (ref 12–46)
Lymphs Abs: 2.9 10*3/uL (ref 0.7–4.0)
MCH: 27.5 pg (ref 26.0–34.0)
MCHC: 32.6 g/dL (ref 30.0–36.0)
MCV: 84.4 fL (ref 78.0–100.0)
Monocytes Absolute: 0.6 10*3/uL (ref 0.1–1.0)
Monocytes Relative: 8 % (ref 3–12)
NEUTROS PCT: 54 % (ref 43–77)
Neutro Abs: 4.3 10*3/uL (ref 1.7–7.7)
PLATELETS: 365 10*3/uL (ref 150–400)
RBC: 4.04 MIL/uL (ref 3.87–5.11)
RDW: 14.8 % (ref 11.5–15.5)
WBC: 8 10*3/uL (ref 4.0–10.5)

## 2014-02-16 MED ORDER — FUROSEMIDE 20 MG PO TABS: 20.0000 mg | ORAL_TABLET | Freq: Every day | ORAL | Status: DC

## 2014-02-16 MED ORDER — DESONIDE 0.05 % EX CREA: 1.0000 "application " | TOPICAL_CREAM | Freq: Two times a day (BID) | CUTANEOUS | Status: DC

## 2014-02-16 MED ORDER — SULFAMETHOXAZOLE-TMP DS 800-160 MG PO TABS: 1.0000 | ORAL_TABLET | Freq: Two times a day (BID) | ORAL | Status: DC

## 2014-02-16 NOTE — Progress Notes (Signed)
Pre visit review using our clinic review tool, if applicable. No additional management support is needed unless otherwise documented below in the visit note. 

## 2014-02-16 NOTE — Patient Instructions (Signed)
Follow up as needed Start the Bactrim twice daily- take w/ food Use the Lasix to decrease the swelling as needed Continue to drink plenty of fluids Elevate your legs as able LIMIT YOUR SALT INTAKE! We'll notify you of your lab results and make any changes if needed We'll call you with your breast appt Call with any questions or concerns Hang in there!!!

## 2014-02-16 NOTE — Progress Notes (Signed)
   Subjective:    Patient ID: Vanessa Reeves, female    DOB: [birth year], 43 y.o.   MRN: [id number]  HPI Breast lump- L breast, breast felt heavy when walking over the weekend so she did an exam at home and felt a lump.  Husband took her to ER.  They felt it was it a cyst but they could not image in the ER.  Was told to avoid caffeine until she could get a mammo.  + pain.  Edema- bilateral LE edema.  Intermittent swelling for the last few days.  Difficult to walk due to discomfort.  No redness.  Not on OCP.  + SOB.  No swelling of hands.  + high salt diet- eating drive through and cafeteria food.   Review of Systems For ROS see HPI     Objective:   Physical Exam  Vitals reviewed. Constitutional: She is oriented to person, place, and time. She appears well-developed and well-nourished. No distress.  HENT:  Head: Normocephalic and atraumatic.  Eyes: Conjunctivae and EOM are normal. Pupils are equal, round, and reactive to light.  Neck: Normal range of motion. Neck supple. No thyromegaly present.  Cardiovascular: Normal rate, regular rhythm, normal heart sounds and intact distal pulses.   No murmur heard. Pulmonary/Chest: Effort normal and breath sounds normal. No respiratory distress. Right breast exhibits no inverted nipple, no mass, no nipple discharge, no skin change and no tenderness. Left breast exhibits mass and tenderness. Left breast exhibits no inverted nipple, no nipple discharge and no skin change.    Abdominal: Soft. She exhibits no distension. There is no tenderness.  Musculoskeletal: She exhibits edema (1+ edema of bilateral LEs w/o erythema or palpable cord).  Lymphadenopathy:    She has no cervical adenopathy.  Neurological: She is alert and oriented to person, place, and time.  Skin: Skin is warm and dry.  Psychiatric: She has a normal mood and affect. Her behavior is normal.          Assessment & Plan:

## 2014-02-16 NOTE — Telephone Encounter (Signed)
Med filled.  

## 2014-02-17 LAB — HEPATIC FUNCTION PANEL
ALBUMIN: 3.6 g/dL (ref 3.5–5.2)
ALK PHOS: 70 U/L (ref 39–117)
ALT: 11 U/L (ref 0–35)
AST: 12 U/L (ref 0–37)
BILIRUBIN INDIRECT: 0.1 mg/dL — AB (ref 0.2–1.2)
BILIRUBIN TOTAL: 0.2 mg/dL (ref 0.2–1.2)
Bilirubin, Direct: 0.1 mg/dL (ref 0.0–0.3)
Total Protein: 6.7 g/dL (ref 6.0–8.3)

## 2014-02-17 LAB — BASIC METABOLIC PANEL
BUN: 13 mg/dL (ref 6–23)
CO2: 26 mEq/L (ref 19–32)
Calcium: 8.9 mg/dL (ref 8.4–10.5)
Chloride: 99 mEq/L (ref 96–112)
Creat: 0.9 mg/dL (ref 0.50–1.10)
GLUCOSE: 125 mg/dL — AB (ref 70–99)
Potassium: 3.4 mEq/L — ABNORMAL LOW (ref 3.5–5.3)
Sodium: 137 mEq/L (ref 135–145)

## 2014-02-17 LAB — TSH: TSH: 2.444 u[IU]/mL (ref 0.350–4.500)

## 2014-02-18 NOTE — Assessment & Plan Note (Signed)
New.  Suspect this is due to pt's inactivity, high salt diet.  No evidence of systemic fluid overload- no crackles on lung exam, no swelling of hands.  Start Lasix prn, check labs.  Reviewed supportive care and red flags that should prompt return.  Pt expressed understanding and is in agreement w/ plan.

## 2014-02-18 NOTE — Assessment & Plan Note (Signed)
New.  Due to pain, concern for mastitis vs abscess.  Start abx.  Get imaging to assess

## 2014-02-21 ENCOUNTER — Encounter: Payer: Self-pay | Admitting: Family Medicine

## 2014-02-21 NOTE — Telephone Encounter (Signed)
Replied to pt advising her to call the Breast Center to reschedule her appt and gave contact info. They most likely do not have anything left for Monday morning, so I advised her it would be best for her to call and reschedule herself.

## 2014-02-26 ENCOUNTER — Other Ambulatory Visit: Payer: Self-pay | Admitting: Family Medicine

## 2014-02-26 ENCOUNTER — Ambulatory Visit
Admission: RE | Admit: 2014-02-26 | Discharge: 2014-02-26 | Disposition: A | Discharge status: Home / Self Care | Payer: 59 | Source: Ambulatory Visit | Attending: Family Medicine | Admitting: Family Medicine

## 2014-02-26 DIAGNOSIS — N6320 Unspecified lump in the left breast, unspecified quadrant: Secondary | ICD-10-CM

## 2014-02-26 NOTE — Telephone Encounter (Signed)
Caller name: [place] Relation to pt: husband Call back number: [phone] Pharmacy:  [facility]  Reason for call:  Pt was taking the antibiotics, but they made her sick and vomitting, so she discontinued use.  Pt would like something different that will not make her as sick.  Husband states that pt took the old rx with food, but it still made her sick.

## 2014-02-27 ENCOUNTER — Encounter: Payer: Self-pay | Admitting: Family Medicine

## 2014-02-27 ENCOUNTER — Telehealth: Payer: Self-pay | Admitting: Family Medicine

## 2014-02-27 MED ORDER — DOXYCYCLINE HYCLATE 100 MG PO TABS: 100.0000 mg | ORAL_TABLET | Freq: Two times a day (BID) | ORAL | Status: DC

## 2014-02-27 NOTE — Telephone Encounter (Signed)
Can switch to Doxycycline 100mg  bid x7 days- take w/ food

## 2014-02-27 NOTE — Telephone Encounter (Signed)
Please advise 

## 2014-02-27 NOTE — Telephone Encounter (Signed)
Caller name: [place]  Relation to pt: husband  Call back number: [phone]  Pharmacy: [facility]   Reason for call: Pt was taking the antibiotics, but they made her sick and vomitting, so she discontinued use. Pt would like something different that will not make her as sick. Husband states that pt took the old rx with food, but it still made her sick.   **pt's husband called yesterday and I made notations and routed message, but it ended up in the email section with a date of 4/22.

## 2014-02-27 NOTE — Telephone Encounter (Signed)
Called husband and made him aware of the change in therapy.  Encouraged patient to advise wife to take medication with food.  Stated understanding and agreed with plan.  Rx sent to [facility].  Husband/wife made aware.  No further questions or concerns voiced.

## 2014-03-23 ENCOUNTER — Encounter: Payer: Self-pay | Admitting: Family Medicine

## 2014-03-30 ENCOUNTER — Ambulatory Visit (INDEPENDENT_AMBULATORY_CARE_PROVIDER_SITE_OTHER): Payer: 59 | Admitting: Family Medicine

## 2014-03-30 ENCOUNTER — Encounter: Payer: Self-pay | Admitting: Family Medicine

## 2014-03-30 ENCOUNTER — Ambulatory Visit: Payer: 59 | Admitting: Family Medicine

## 2014-03-30 ENCOUNTER — Other Ambulatory Visit (HOSPITAL_COMMUNITY)
Admission: RE | Admit: 2014-03-30 | Discharge: 2014-03-30 | Disposition: A | Discharge status: Home / Self Care | Payer: 59 | Source: Ambulatory Visit | Attending: Family Medicine | Admitting: Family Medicine

## 2014-03-30 VITALS — BP 126/76 | HR 72 | Temp 98.1°F | Resp 16 | Wt 306.4 lb

## 2014-03-30 DIAGNOSIS — R350 Frequency of micturition: Secondary | ICD-10-CM

## 2014-03-30 DIAGNOSIS — R300 Dysuria: Secondary | ICD-10-CM

## 2014-03-30 DIAGNOSIS — N760 Acute vaginitis: Secondary | ICD-10-CM | POA: Insufficient documentation

## 2014-03-30 DIAGNOSIS — R319 Hematuria, unspecified: Secondary | ICD-10-CM

## 2014-03-30 LAB — POCT URINALYSIS DIPSTICK
BILIRUBIN UA: NEGATIVE
Glucose, UA: NEGATIVE
KETONES UA: NEGATIVE
LEUKOCYTES UA: NEGATIVE
Nitrite, UA: NEGATIVE
PH UA: 6
PROTEIN UA: NEGATIVE
Spec Grav, UA: 1.015
Urobilinogen, UA: 0.2

## 2014-03-30 NOTE — Assessment & Plan Note (Signed)
New.  Pt's sxs consistent w/ either yeast or BV although it is unusual that she denies vaginal d/c.  Wet prep sent.  Will treat accordingly based on results.  Pt expressed understanding and is in agreement w/ plan.

## 2014-03-30 NOTE — Patient Instructions (Signed)
We'll notify you of your results via MyChart and I'll send in the appropriate meds Drink plenty of fluids Call with any questions or concerns Have a great weekend!

## 2014-03-30 NOTE — Progress Notes (Signed)
   Subjective:    Patient ID: Vanessa Reeves, female    DOB: [birth year], 43 y.o.   MRN: [id number]  HPI Urinary frequency- feels this is related to lasix.  no burning w/ urination.  + urgency.  No urinary odor or changes to color.  Vaginal itching- some vaginal odor and itching.  No vaginal d/c.  sxs started last week but has worsened.   Review of Systems For ROS see HPI     Objective:   Physical Exam  Vitals reviewed. Constitutional: She appears well-developed and well-nourished. No distress.  obese  Abdominal: Soft. Bowel sounds are normal. She exhibits no distension. There is no tenderness (no CVA or suprapubic tenderness).  Genitourinary: Vagina normal. No vaginal discharge found.  No labial irritation noted          Assessment & Plan:

## 2014-03-30 NOTE — Assessment & Plan Note (Signed)
New.  UA w/ trace blood.  Will await culture results prior to treating.  Pt expressed understanding and is in agreement w/ plan.

## 2014-03-30 NOTE — Addendum Note (Signed)
Addended by: Modena Morrow D on: 03/30/2014 03:28 PM   Modules accepted: Orders

## 2014-03-30 NOTE — Progress Notes (Signed)
Pre visit review using our clinic review tool, if applicable. No additional management support is needed unless otherwise documented below in the visit note. 

## 2014-04-01 LAB — URINE CULTURE
Colony Count: NO GROWTH
Organism ID, Bacteria: NO GROWTH

## 2014-04-02 MED ORDER — FLUCONAZOLE 150 MG PO TABS: 150.0000 mg | ORAL_TABLET | Freq: Once | ORAL | Status: DC

## 2014-04-02 NOTE — Addendum Note (Signed)
Addended by: Kris Hartmann on: 04/02/2014 08:28 AM   Modules accepted: Orders

## 2014-04-04 ENCOUNTER — Encounter: Payer: Self-pay | Admitting: Family Medicine

## 2014-04-27 ENCOUNTER — Other Ambulatory Visit: Payer: Self-pay | Admitting: Family Medicine

## 2014-04-27 NOTE — Telephone Encounter (Signed)
Med filled and faxed.  

## 2014-04-27 NOTE — Telephone Encounter (Signed)
Last OV 03-30-14 Med filled 12-19-13 #60 with 1  Low risk

## 2014-07-23 ENCOUNTER — Other Ambulatory Visit: Payer: Self-pay | Admitting: Family Medicine

## 2014-07-23 NOTE — Telephone Encounter (Signed)
meds filled

## 2014-07-27 ENCOUNTER — Encounter: Payer: Self-pay | Admitting: Family Medicine

## 2014-07-27 ENCOUNTER — Ambulatory Visit (INDEPENDENT_AMBULATORY_CARE_PROVIDER_SITE_OTHER): Payer: 59 | Admitting: Family Medicine

## 2014-07-27 VITALS — BP 132/86 | HR 79 | Temp 98.0°F | Resp 16 | Wt 308.0 lb

## 2014-07-27 DIAGNOSIS — F418 Other specified anxiety disorders: Secondary | ICD-10-CM

## 2014-07-27 DIAGNOSIS — Z23 Encounter for immunization: Secondary | ICD-10-CM

## 2014-07-27 DIAGNOSIS — F341 Dysthymic disorder: Secondary | ICD-10-CM

## 2014-07-27 MED ORDER — BUPROPION HCL ER (XL) 150 MG PO TB24: 150.0000 mg | ORAL_TABLET | Freq: Every day | ORAL | Status: DC

## 2014-07-27 NOTE — Progress Notes (Signed)
Pre visit review using our clinic review tool, if applicable. No additional management support is needed unless otherwise documented below in the visit note. 

## 2014-07-27 NOTE — Progress Notes (Signed)
   Subjective:    Patient ID: Vanessa Reeves, female    DOB: [birth year], 43 y.o.   MRN: [id number]  HPI Anxiety- pt reports anxiety increased during last test 2 weeks ago and has remained high since.  Pt reports she is safe.  Still taking Celexa.  Using alprazolam as needed- not using frequently.  Pt reports she is busy w/ school and is spending extra time studying and husband is not understanding and they end up fighting.  Pt reports trouble w/ focus.   Review of Systems For ROS see HPI     Objective:   Physical Exam  Vitals reviewed. Constitutional: She is oriented to person, place, and time. She appears well-developed and well-nourished. She appears distressed (tearful, anxious).  Cardiovascular: Normal rate, regular rhythm and normal heart sounds.   Pulmonary/Chest: Effort normal and breath sounds normal. No respiratory distress. She has no wheezes. She has no rales.  Neurological: She is alert and oriented to person, place, and time.  Skin: Skin is warm and dry.  Psychiatric: Her behavior is normal.  Tearful, anxious          Assessment & Plan:

## 2014-07-27 NOTE — Patient Instructions (Signed)
Follow up in 3-4 weeks to recheck anxiety Continue the Celexa Add the Wellbutrin once daily- take in the AM If you have increased anxiety, insomnia, or other concerns- stop the medication and call me Call with any questions or concerns Hang in there!  You can do this!!!

## 2014-07-28 NOTE — Assessment & Plan Note (Signed)
Deteriorated.  Pt is more anxious/depressed than previous.  Now having trouble w/ focus/concentration.  Already on Celexa 40mg .  Add Wellbutrin.  Reviewed supportive care and red flags that should prompt return.  Pt expressed understanding and is in agreement w/ plan.

## 2014-08-17 ENCOUNTER — Ambulatory Visit: Payer: 59 | Admitting: Family Medicine

## 2014-09-03 ENCOUNTER — Encounter: Payer: Self-pay | Admitting: Family Medicine

## 2014-09-11 ENCOUNTER — Encounter (HOSPITAL_BASED_OUTPATIENT_CLINIC_OR_DEPARTMENT_OTHER): Payer: Self-pay

## 2014-09-11 ENCOUNTER — Emergency Department (HOSPITAL_BASED_OUTPATIENT_CLINIC_OR_DEPARTMENT_OTHER): Payer: 59

## 2014-09-11 ENCOUNTER — Emergency Department (HOSPITAL_BASED_OUTPATIENT_CLINIC_OR_DEPARTMENT_OTHER)
Admission: EM | Admit: 2014-09-11 | Discharge: 2014-09-11 | Disposition: A | Discharge status: Home / Self Care | Payer: 59 | Attending: Emergency Medicine | Admitting: Emergency Medicine

## 2014-09-11 DIAGNOSIS — Z79899 Other long term (current) drug therapy: Secondary | ICD-10-CM | POA: Diagnosis not present

## 2014-09-11 DIAGNOSIS — R42 Dizziness and giddiness: Secondary | ICD-10-CM

## 2014-09-11 DIAGNOSIS — F419 Anxiety disorder, unspecified: Secondary | ICD-10-CM | POA: Diagnosis not present

## 2014-09-11 DIAGNOSIS — I10 Essential (primary) hypertension: Secondary | ICD-10-CM | POA: Insufficient documentation

## 2014-09-11 DIAGNOSIS — Z880 Allergy status to penicillin: Secondary | ICD-10-CM | POA: Insufficient documentation

## 2014-09-11 DIAGNOSIS — R51 Headache: Secondary | ICD-10-CM | POA: Insufficient documentation

## 2014-09-11 DIAGNOSIS — R079 Chest pain, unspecified: Secondary | ICD-10-CM | POA: Diagnosis not present

## 2014-09-11 DIAGNOSIS — Z87891 Personal history of nicotine dependence: Secondary | ICD-10-CM | POA: Diagnosis not present

## 2014-09-11 DIAGNOSIS — R519 Headache, unspecified: Secondary | ICD-10-CM

## 2014-09-11 DIAGNOSIS — Z7951 Long term (current) use of inhaled steroids: Secondary | ICD-10-CM | POA: Insufficient documentation

## 2014-09-11 LAB — CBC WITH DIFFERENTIAL/PLATELET
Basophils Absolute: 0 10*3/uL (ref 0.0–0.1)
Basophils Relative: 0 % (ref 0–1)
EOS PCT: 2 % (ref 0–5)
Eosinophils Absolute: 0.2 10*3/uL (ref 0.0–0.7)
HCT: 36 % (ref 36.0–46.0)
Hemoglobin: 11.6 g/dL — ABNORMAL LOW (ref 12.0–15.0)
LYMPHS ABS: 3 10*3/uL (ref 0.7–4.0)
Lymphocytes Relative: 40 % (ref 12–46)
MCH: 27.8 pg (ref 26.0–34.0)
MCHC: 32.2 g/dL (ref 30.0–36.0)
MCV: 86.3 fL (ref 78.0–100.0)
Monocytes Absolute: 0.6 10*3/uL (ref 0.1–1.0)
Monocytes Relative: 7 % (ref 3–12)
Neutro Abs: 3.9 10*3/uL (ref 1.7–7.7)
Neutrophils Relative %: 51 % (ref 43–77)
PLATELETS: 350 10*3/uL (ref 150–400)
RBC: 4.17 MIL/uL (ref 3.87–5.11)
RDW: 14.6 % (ref 11.5–15.5)
WBC: 7.7 10*3/uL (ref 4.0–10.5)

## 2014-09-11 LAB — COMPREHENSIVE METABOLIC PANEL
ALT: 11 U/L (ref 0–35)
ANION GAP: 11 (ref 5–15)
AST: 14 U/L (ref 0–37)
Albumin: 3.3 g/dL — ABNORMAL LOW (ref 3.5–5.2)
Alkaline Phosphatase: 80 U/L (ref 39–117)
BUN: 14 mg/dL (ref 6–23)
CALCIUM: 9.5 mg/dL (ref 8.4–10.5)
CO2: 28 meq/L (ref 19–32)
Chloride: 99 mEq/L (ref 96–112)
Creatinine, Ser: 0.9 mg/dL (ref 0.50–1.10)
GFR calc Af Amer: >90 mL/min (ref >90)
GFR, EST NON AFRICAN AMERICAN: 78 mL/min — AB (ref >90)
Glucose, Bld: 99 mg/dL (ref 70–99)
Potassium: 3.5 mEq/L — ABNORMAL LOW (ref 3.7–5.3)
SODIUM: 138 meq/L (ref 137–147)
TOTAL PROTEIN: 7.8 g/dL (ref 6.0–8.3)
Total Bilirubin: 0.2 mg/dL — ABNORMAL LOW (ref 0.3–1.2)

## 2014-09-11 LAB — TROPONIN I: Troponin I: <0.3 ng/mL (ref <0.30)

## 2014-09-11 MED ORDER — DEXAMETHASONE SODIUM PHOSPHATE 10 MG/ML IJ SOLN: 10.0000 mg | Freq: Once | INTRAMUSCULAR | Status: AC

## 2014-09-11 MED ORDER — DEXAMETHASONE SODIUM PHOSPHATE 4 MG/ML IJ SOLN
INTRAMUSCULAR | Status: AC
  Administered 2014-09-11: 10 mg
  Filled 2014-09-11: qty 3

## 2014-09-11 MED ORDER — METOCLOPRAMIDE HCL 5 MG/ML IJ SOLN
10.0000 mg | Freq: Once | INTRAMUSCULAR | Status: AC
  Administered 2014-09-11: 10 mg via INTRAVENOUS
  Filled 2014-09-11: qty 2

## 2014-09-11 MED ORDER — DIPHENHYDRAMINE HCL 50 MG/ML IJ SOLN
25.0000 mg | Freq: Once | INTRAMUSCULAR | Status: AC
  Administered 2014-09-11: 25 mg via INTRAVENOUS
  Filled 2014-09-11: qty 1

## 2014-09-11 MED ORDER — KETOROLAC TROMETHAMINE 30 MG/ML IJ SOLN
30.0000 mg | Freq: Once | INTRAMUSCULAR | Status: AC
  Administered 2014-09-11: 30 mg via INTRAVENOUS
  Filled 2014-09-11: qty 1

## 2014-09-11 MED ORDER — SODIUM CHLORIDE 0.9 % IV BOLUS (SEPSIS)
1000.0000 mL | INTRAVENOUS | Status: AC
  Administered 2014-09-11: 1000 mL via INTRAVENOUS

## 2014-09-11 NOTE — ED Notes (Signed)
Pt reports dizziness, headache and lightheadedness x 1 week. Reports frontal headache. Denies sinus drainage. Sts dizziness is all the time.

## 2014-09-11 NOTE — ED Notes (Signed)
MD at bedside. 

## 2014-09-11 NOTE — ED Notes (Signed)
After seeing MD, pts daughter reports that pt has been having intermittent chest pain x "a while." Pt sts the last time she had the CP was "a few days ago." Denies CP at this time

## 2014-09-11 NOTE — ED Provider Notes (Signed)
CSN: [id number]     Arrival date & time 09/11/14  4081 History   First MD Initiated Contact with Patient 09/11/14 [phone]     Chief Complaint  Patient presents with  . Dizziness     (Consider location/radiation/quality/duration/timing/severity/associated sxs/prior Treatment) Patient is a 43 y.o. female presenting with dizziness, headaches, and chest pain. The history is provided by the patient.  Dizziness Quality:  Lightheadedness Severity:  Mild Onset quality:  Gradual Duration:  1 week Timing: initially intermittent, now constant. Progression:  Unchanged Chronicity:  Recurrent Context comment:  Worse w/ standing Relieved by:  Nothing Worsened by:  Nothing tried Ineffective treatments:  None tried Associated symptoms: chest pain, headaches and shortness of breath   Associated symptoms: no diarrhea, no nausea and no vomiting   Headache Pain location:  Frontal Quality:  Dull Radiates to:  Does not radiate Severity currently:  10/10 Severity at highest:  10/10 Onset quality:  Gradual Duration:  1 week Timing: initially intermittent and is now constant over the last few days. Progression:  Worsening Chronicity:  Recurrent Similar to prior headaches: yes   Context comment:  At rest Relieved by:  Nothing Worsened by:  Nothing tried Ineffective treatments:  NSAIDs Associated symptoms: dizziness   Associated symptoms: no abdominal pain, no back pain, no congestion, no cough, no diarrhea, no pain, no fatigue, no fever, no nausea, no neck pain and no vomiting   Chest Pain Pain location:  L chest Pain quality: aching   Pain radiates to:  Does not radiate Pain radiates to the back: no   Pain severity:  Mild Onset quality:  Gradual Timing:  Sporadic Progression:  Unchanged Chronicity:  Recurrent Context: at rest   Relieved by:  Nothing Worsened by:  Nothing tried Ineffective treatments: nsaids. Associated symptoms: dizziness, headache and shortness of breath   Associated  symptoms: no abdominal pain, no back pain, no cough, no fatigue, no fever, no nausea and not vomiting     Past Medical History  Diagnosis Date  . Hypertension   . Anxiety    Past Surgical History  Procedure Laterality Date  . Tubal ligation    . Dilation and curettage of uterus    . Abdominal hysterectomy     Family History  Problem Relation Age of Onset  . Other Neg Hx    History  Substance Use Topics  . Smoking status: Former Research scientist (life sciences)  . Smokeless tobacco: Not on file  . Alcohol Use: Yes     Comment: socially   OB History    Gravida Para Term Preterm AB TAB SAB Ectopic Multiple Living   5 3 2 1 2 1 1         Review of Systems  Constitutional: Negative for fever and fatigue.  HENT: Negative for congestion and drooling.   Eyes: Negative for pain.  Respiratory: Positive for shortness of breath. Negative for cough.   Cardiovascular: Positive for chest pain.  Gastrointestinal: Negative for nausea, vomiting, abdominal pain and diarrhea.  Genitourinary: Negative for dysuria and hematuria.  Musculoskeletal: Negative for back pain, gait problem and neck pain.  Skin: Negative for color change.  Neurological: Positive for dizziness and headaches.  Hematological: Negative for adenopathy.  Psychiatric/Behavioral: Negative for behavioral problems.  All other systems reviewed and are negative.     Allergies  Penicillins  Home Medications   Prior to Admission medications   Medication Sig Start Date End Date Taking? Authorizing Provider  ALPRAZolam (XANAX) 0.5 MG tablet TAKE 1 TABLET BY MOUTH  TWICE A DAY 07/23/14   Midge Minium, MD  atenolol (TENORMIN) 50 MG tablet TAKE 2 TABLETS BY MOUTH AT BEDTIME. 12/29/13   Midge Minium, MD  buPROPion (WELLBUTRIN XL) 150 MG 24 hr tablet Take 1 tablet (150 mg total) by mouth daily. 07/27/14   Midge Minium, MD  citalopram (CELEXA) 40 MG tablet TAKE 1 TABLET BY MOUTH ONCE DAILY 07/23/14   Midge Minium, MD  desonide  (DESOWEN) 0.05 % cream Apply 1 application topically 2 (two) times daily. 02/16/14   Midge Minium, MD  fluconazole (DIFLUCAN) 150 MG tablet Take 1 tablet (150 mg total) by mouth once. 04/02/14   Midge Minium, MD  fluticasone (FLONASE) 50 MCG/ACT nasal spray Place 2 sprays into both nostrils daily. 12/29/13   Midge Minium, MD  furosemide (LASIX) 20 MG tablet TAKE 1 TABLET BY MOUTH ONCE DAILY 07/23/14   Midge Minium, MD  losartan-hydrochlorothiazide (HYZAAR) 100-25 MG per tablet TAKE 1 TABLET BY MOUTH DAILY. 12/29/13   Midge Minium, MD  potassium chloride SA (K-DUR,KLOR-CON) 20 MEQ tablet Take 1 tablet (20 mEq total) by mouth daily. 12/29/13   Midge Minium, MD  SSS 10-5 10-5 % FOAM APPLY TOPICALLY ONCE A DAY AS DIRECTED 02/16/14   Midge Minium, MD   BP 160/82 mmHg  Pulse 77  Temp(Src) 98.6 F (37 C) (Oral)  Resp 18  Ht 5\' 4"  (1.626 m)  Wt 300 lb (136.079 kg)  BMI 51.47 kg/m2  SpO2 100%  LMP 12/04/2011 Physical Exam  Constitutional: She is oriented to person, place, and time. She appears well-developed and well-nourished.  HENT:  Head: Normocephalic and atraumatic.  Mouth/Throat: Oropharynx is clear and moist. No oropharyngeal exudate.  Eyes: Conjunctivae and EOM are normal. Pupils are equal, round, and reactive to light.  Neck: Normal range of motion. Neck supple.  Cardiovascular: Normal rate, regular rhythm, normal heart sounds and intact distal pulses.  Exam reveals no gallop and no friction rub.   No murmur heard. Pulmonary/Chest: Effort normal and breath sounds normal. No respiratory distress. She has no wheezes.  Abdominal: Soft. Bowel sounds are normal. There is no tenderness. There is no rebound and no guarding.  Musculoskeletal: Normal range of motion. She exhibits no edema or tenderness.  Neurological: She is alert and oriented to person, place, and time. She displays a negative Romberg sign.  alert, oriented x3 speech: normal in context and  clarity memory: intact grossly cranial nerves II-XII: intact motor strength: full proximally and distally no involuntary movements or tremors sensation: intact to light touch diffusely  cerebellar: finger-to-nose and heel-to-shin intact gait: normal forwards and backwards   Skin: Skin is warm and dry.  Psychiatric: She has a normal mood and affect. Her behavior is normal.  Nursing note and vitals reviewed.   ED Course  Procedures (including critical care time) Labs Review Labs Reviewed  CBC WITH DIFFERENTIAL - Abnormal; Notable for the following:    Hemoglobin 11.6 (*)    All other components within normal limits  COMPREHENSIVE METABOLIC PANEL - Abnormal; Notable for the following:    Potassium 3.5 (*)    Albumin 3.3 (*)    Total Bilirubin 0.2 (*)    GFR calc non Af Amer 78 (*)    All other components within normal limits  TROPONIN I    Imaging Review Dg Chest 2 View  09/11/2014   CLINICAL DATA:  Chest pain.  EXAM: CHEST  2 VIEW  COMPARISON:  09/16/2013  FINDINGS: Normal heart size and mediastinal contours. No acute infiltrate or edema. No effusion or pneumothorax. No acute osseous findings.  IMPRESSION: No active cardiopulmonary disease.   Electronically Signed   By: Jorje Guild M.D.   On: 09/11/2014 09:51     EKG Interpretation   Date/Time:  Tuesday September 11 2014 09:39:19 EST Ventricular Rate:  67 PR Interval:  178 QRS Duration: 84 QT Interval:  406 QTC Calculation: 429 R Axis:   2 Text Interpretation:  Normal sinus rhythm Normal ECG no previous for  comparison Confirmed by Besse Miron  MD, Docia Klar ([id number]) on 09/11/2014 9:46:00  AM     Procedure note: Ultrasound Guided Peripheral IV Ultrasound guided 20 g peripheral 1.88 inch angiocath IV placement performed by me. Indications: Nursing unable to place IV. Details: The right antecubital fossa and upper arm was evaluated with a multifrequency linear probe. Several patent brachial veins are noted. 1 attempts were  made to cannulate a right antecubital vein under realtime US guidance with successful cannulation of the vein and catheter placement. There is return of non-pulsatile dark red blood. The patient tolerated the procedure well without complications.   MDM   Final diagnoses:  Chest pain  Headache, unspecified headache type  Lightheadedness    9:28 AM 43 y.o. female who presents with intermittent frontal headache, lightheadedness for approximately 1 week. She notes gradual onset of headache which was initially intermittent but now constant over the last few days. She denies any fevers or head injury. She has had similar symptoms with lightheadedness in the past. 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 She denies any chest pain or shortness of breath now states she had her last episode several days ago. Not associated with exertion. Risk factors for heart disease include hypertension, prediabetes, family history. Will treat his migraine cocktail and get screening lab work.  11:53 AM: I interpreted/reviewed the labs and/or imaging which were non-contributory.  HA improved. Pt would like to go home.  I have discussed the diagnosis/risks/treatment options with the patient and family and believe the pt to be eligible for discharge home to follow-up with her pcp for further cp workup. We also discussed returning to the ED immediately if new or worsening sx occur. We discussed the sx which are most concerning (e.g., fever, worsening HA) that necessitate immediate return. Medications administered to the patient during their visit and any new prescriptions provided to the patient are listed below.  Medications given during this visit Medications  sodium chloride 0.9 % bolus 1,000 mL (0 mLs Intravenous Stopped 09/11/14 1152)  ketorolac (TORADOL) 30 MG/ML injection 30 mg (30 mg Intravenous Given 09/11/14 1026)   metoCLOPramide (REGLAN) injection 10 mg (10 mg Intravenous Given 09/11/14 1026)  diphenhydrAMINE (BENADRYL) injection 25 mg (25 mg Intravenous Given 09/11/14 1026)  dexamethasone (DECADRON) injection 10 mg (0 mg Intravenous Duplicate 48/54/62 7035)  dexamethasone (DECADRON) 4 MG/ML injection (10 mg  Given 09/11/14 1026)    New Prescriptions   No medications on file     Pamella Pert, MD 09/11/14 2118

## 2014-09-12 ENCOUNTER — Other Ambulatory Visit: Payer: Self-pay | Admitting: Family Medicine

## 2014-09-12 NOTE — Telephone Encounter (Signed)
Med filled.  

## 2014-09-20 ENCOUNTER — Encounter (HOSPITAL_COMMUNITY): Payer: Self-pay

## 2014-09-20 ENCOUNTER — Emergency Department (HOSPITAL_COMMUNITY): Payer: 59

## 2014-09-20 ENCOUNTER — Emergency Department (HOSPITAL_COMMUNITY)
Admission: EM | Admit: 2014-09-20 | Discharge: 2014-09-20 | Disposition: A | Discharge status: Home / Self Care | Payer: 59 | Attending: Emergency Medicine | Admitting: Emergency Medicine

## 2014-09-20 DIAGNOSIS — T1490XA Injury, unspecified, initial encounter: Secondary | ICD-10-CM

## 2014-09-20 DIAGNOSIS — W501XXA Accidental kick by another person, initial encounter: Secondary | ICD-10-CM | POA: Insufficient documentation

## 2014-09-20 DIAGNOSIS — I10 Essential (primary) hypertension: Secondary | ICD-10-CM | POA: Diagnosis not present

## 2014-09-20 DIAGNOSIS — Y990 Civilian activity done for income or pay: Secondary | ICD-10-CM | POA: Diagnosis not present

## 2014-09-20 DIAGNOSIS — S8991XA Unspecified injury of right lower leg, initial encounter: Secondary | ICD-10-CM | POA: Diagnosis present

## 2014-09-20 DIAGNOSIS — Y93F9 Activity, other caregiving: Secondary | ICD-10-CM | POA: Diagnosis not present

## 2014-09-20 DIAGNOSIS — F419 Anxiety disorder, unspecified: Secondary | ICD-10-CM | POA: Insufficient documentation

## 2014-09-20 DIAGNOSIS — Z79899 Other long term (current) drug therapy: Secondary | ICD-10-CM | POA: Insufficient documentation

## 2014-09-20 DIAGNOSIS — Y9289 Other specified places as the place of occurrence of the external cause: Secondary | ICD-10-CM | POA: Insufficient documentation

## 2014-09-20 DIAGNOSIS — Z880 Allergy status to penicillin: Secondary | ICD-10-CM | POA: Diagnosis not present

## 2014-09-20 DIAGNOSIS — Z7951 Long term (current) use of inhaled steroids: Secondary | ICD-10-CM | POA: Insufficient documentation

## 2014-09-20 DIAGNOSIS — Z87891 Personal history of nicotine dependence: Secondary | ICD-10-CM | POA: Insufficient documentation

## 2014-09-20 MED ORDER — DIAZEPAM 5 MG PO TABS: 5.0000 mg | ORAL_TABLET | Freq: Three times a day (TID) | ORAL | Status: DC | PRN

## 2014-09-20 MED ORDER — DIAZEPAM 5 MG PO TABS
5.0000 mg | ORAL_TABLET | Freq: Once | ORAL | Status: AC
  Administered 2014-09-20: 5 mg via ORAL
  Filled 2014-09-20: qty 1

## 2014-09-20 MED ORDER — OXYCODONE-ACETAMINOPHEN 5-325 MG PO TABS
2.0000 | ORAL_TABLET | Freq: Once | ORAL | Status: AC
  Administered 2014-09-20: 2 via ORAL
  Filled 2014-09-20: qty 2

## 2014-09-20 MED ORDER — OXYCODONE-ACETAMINOPHEN 5-325 MG PO TABS: 1.0000 | ORAL_TABLET | Freq: Four times a day (QID) | ORAL | Status: DC | PRN

## 2014-09-20 NOTE — Discharge Instructions (Signed)
Your xray does not show any fracture or dislocation You have been placed in a knee immobilizer for stabilization, given prescription for pain and muscle spasm control and referred to an orthopedist for further evaluation
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
## 2014-10-03 ENCOUNTER — Encounter: Payer: Self-pay | Admitting: Family Medicine

## 2014-10-30 ENCOUNTER — Other Ambulatory Visit: Payer: Self-pay | Admitting: Family Medicine

## 2014-10-31 NOTE — Telephone Encounter (Signed)
Med filled.  

## 2014-11-02 LAB — HM DIABETES EYE EXAM

## 2014-12-14 ENCOUNTER — Encounter: Payer: Self-pay | Admitting: Family Medicine

## 2014-12-14 ENCOUNTER — Ambulatory Visit (INDEPENDENT_AMBULATORY_CARE_PROVIDER_SITE_OTHER): Payer: 59 | Admitting: Family Medicine

## 2014-12-14 VITALS — BP 139/84 | HR 66 | Temp 98.0°F | Resp 16 | Ht 65.5 in | Wt 313.5 lb

## 2014-12-14 DIAGNOSIS — E1140 Type 2 diabetes mellitus with diabetic neuropathy, unspecified: Secondary | ICD-10-CM

## 2014-12-14 DIAGNOSIS — J0100 Acute maxillary sinusitis, unspecified: Secondary | ICD-10-CM

## 2014-12-14 DIAGNOSIS — Z0000 Encounter for general adult medical examination without abnormal findings: Secondary | ICD-10-CM | POA: Insufficient documentation

## 2014-12-14 MED ORDER — MELOXICAM 15 MG PO TABS: 15.0000 mg | ORAL_TABLET | Freq: Every day | ORAL | Status: DC

## 2014-12-14 MED ORDER — AZITHROMYCIN 250 MG PO TABS: ORAL_TABLET | ORAL | Status: DC

## 2014-12-14 MED ORDER — FLUTICASONE PROPIONATE 50 MCG/ACT NA SUSP: 2.0000 | Freq: Every day | NASAL | Status: DC

## 2014-12-14 NOTE — Progress Notes (Signed)
   Subjective:    Patient ID: Vanessa Reeves, female    DOB: 04-23-1971, 44 y.o.   MRN: [id number]  HPI CPE- no need for pap due to hysterectomy.  UTD on eye exam, mammo.   Review of Systems Patient reports no vision/ hearing changes, adenopathy,fever, weight change,  persistant/recurrent hoarseness , swallowing issues, chest pain, palpitations, edema, hemoptysis, dyspnea (rest/exertional/paroxysmal nocturnal), gastrointestinal bleeding (melena, rectal bleeding), abdominal pain, significant heartburn, bowel changes, GU symptoms (dysuria, hematuria, incontinence), Gyn symptoms (abnormal  bleeding, pain),  syncope, focal weakness, memory loss, skin/hair/nail changes, abnormal bruising or bleeding, anxiety, or depression.   + cough, worse at night.  sxs started 7-10 days ago.  + maxillary pain/pressure.  + nasal congestion, HA. R hand numbness- occurs at night, positional    Objective:   Physical Exam  General Appearance:    Alert, cooperative, no distress, appears stated age, obese  Head:    Normocephalic, without obvious abnormality, atraumatic  Eyes:    PERRL, conjunctiva/corneas clear, EOM's intact, fundi    benign, both eyes  Ears:    TMs retracted bilaterally  Nose:   Nares normal, septum midline, edematous nasal turbinates w/ pallor.  + maxillary sinus TTP  Throat:   Lips, mucosa, and tongue normal; teeth and gums normal  Neck:   Supple, symmetrical, trachea midline, no adenopathy;    Thyroid: no enlargement/tenderness/nodules  Back:     Symmetric, no curvature, ROM normal, no CVA tenderness  Lungs:     Clear to auscultation bilaterally, respirations unlabored  Chest Wall:    No tenderness or deformity   Heart:    Regular rate and rhythm, S1 and S2 normal, no murmur, rub   or gallop  Breast Exam:    No tenderness, masses, or nipple abnormality  Abdomen:     Soft, non-tender, bowel sounds active all four quadrants,    no masses, no organomegaly  Genitalia:    External genitalia  normal, cervix and uterus surgically absent, adnexa w/out mass or tenderness, mucosa pink and moist, no lesions or discharge present  Rectal:    Normal external appearance  Extremities:   Extremities normal, atraumatic, no cyanosis or edema  Pulses:   2+ and symmetric all extremities  Skin:   Skin color, texture, turgor normal, no rashes or lesions  Lymph nodes:   Cervical, supraclavicular, and axillary nodes normal  Neurologic:   CNII-XII intact, normal strength, no sensation to light touch or monofilament over R 1st/2nd toe          Assessment & Plan:

## 2014-12-14 NOTE — Progress Notes (Signed)
Pre visit review using our clinic review tool, if applicable. No additional management support is needed unless otherwise documented below in the visit note. 

## 2014-12-14 NOTE — Patient Instructions (Signed)
Follow up in 3-4 months to recheck sugars We'll notify you of your lab results and make any changes if needed We'll call you with your neuro appt about the numbness Start the Zpack for the sinus infection Start the Flonase- 2 sprays each nostril daily Add OTC Claritin or Zyrtec daily Start the Mobic once daily for pain/inflammation (don't take any additional ibuprofen- but you can add tylenol) Call with any questions or concerns Happy Valentine's Day!

## 2014-12-16 NOTE — Assessment & Plan Note (Signed)
New.  Pt's sxs and PE consistent w/ infxn.  Start abx.  Reviewed supportive care and red flags that should prompt return.  Pt expressed understanding and is in agreement w/ plan.  

## 2014-12-16 NOTE — Assessment & Plan Note (Signed)
Pt's PE WNL w/ exception of obesity and current sinus infxn.  UTD on mammo.  Check labs.  Anticipatory guidance provided.

## 2014-12-16 NOTE — Assessment & Plan Note (Signed)
Chronic problem.  UTD on eye exam.  On ARB for renal protection.  Attempting to control w/ healthy diet and regular exercise.  Check labs.  Start meds prn.

## 2014-12-21 ENCOUNTER — Encounter: Payer: Self-pay | Admitting: Family Medicine

## 2014-12-22 LAB — HEPATIC FUNCTION PANEL
ALT: 14 U/L (ref 0–35)
AST: 13 U/L (ref 0–37)
Albumin: 3.8 g/dL (ref 3.5–5.2)
Alkaline Phosphatase: 78 U/L (ref 39–117)
BILIRUBIN INDIRECT: 0.2 mg/dL (ref 0.2–1.2)
BILIRUBIN TOTAL: 0.3 mg/dL (ref 0.2–1.2)
Bilirubin, Direct: 0.1 mg/dL (ref 0.0–0.3)
TOTAL PROTEIN: 7.3 g/dL (ref 6.0–8.3)

## 2014-12-22 LAB — CBC WITH DIFFERENTIAL/PLATELET
BASOS ABS: 0 10*3/uL (ref 0.0–0.1)
Basophils Relative: 0 % (ref 0–1)
EOS ABS: 0.2 10*3/uL (ref 0.0–0.7)
Eosinophils Relative: 3 % (ref 0–5)
HCT: 38.7 % (ref 36.0–46.0)
HEMOGLOBIN: 12.3 g/dL (ref 12.0–15.0)
Lymphocytes Relative: 39 % (ref 12–46)
Lymphs Abs: 3.1 10*3/uL (ref 0.7–4.0)
MCH: 28 pg (ref 26.0–34.0)
MCHC: 31.8 g/dL (ref 30.0–36.0)
MCV: 88 fL (ref 78.0–100.0)
MONOS PCT: 8 % (ref 3–12)
MPV: 8.8 fL (ref 8.6–12.4)
Monocytes Absolute: 0.6 10*3/uL (ref 0.1–1.0)
Neutro Abs: 4 10*3/uL (ref 1.7–7.7)
Neutrophils Relative %: 50 % (ref 43–77)
Platelets: 421 10*3/uL — ABNORMAL HIGH (ref 150–400)
RBC: 4.4 MIL/uL (ref 3.87–5.11)
RDW: 15.3 % (ref 11.5–15.5)
WBC: 7.9 10*3/uL (ref 4.0–10.5)

## 2014-12-22 LAB — HEMOGLOBIN A1C
HEMOGLOBIN A1C: 6.5 % — AB (ref <5.7)
Mean Plasma Glucose: 140 mg/dL — ABNORMAL HIGH (ref <117)

## 2014-12-22 LAB — LIPID PANEL
Cholesterol: 128 mg/dL (ref 0–200)
HDL: 54 mg/dL (ref >39)
LDL Cholesterol: 54 mg/dL (ref 0–99)
Total CHOL/HDL Ratio: 2.4 Ratio
Triglycerides: 100 mg/dL (ref <150)
VLDL: 20 mg/dL (ref 0–40)

## 2014-12-22 LAB — BASIC METABOLIC PANEL
BUN: 12 mg/dL (ref 6–23)
CALCIUM: 9.5 mg/dL (ref 8.4–10.5)
CHLORIDE: 99 meq/L (ref 96–112)
CO2: 29 mEq/L (ref 19–32)
CREATININE: 0.97 mg/dL (ref 0.50–1.10)
GLUCOSE: 103 mg/dL — AB (ref 70–99)
Potassium: 4.1 mEq/L (ref 3.5–5.3)
SODIUM: 140 meq/L (ref 135–145)

## 2014-12-22 LAB — TSH: TSH: 2.101 u[IU]/mL (ref 0.350–4.500)

## 2014-12-22 LAB — VITAMIN D 25 HYDROXY (VIT D DEFICIENCY, FRACTURES): Vit D, 25-Hydroxy: 15 ng/mL — ABNORMAL LOW (ref 30–100)

## 2014-12-24 ENCOUNTER — Other Ambulatory Visit: Payer: Self-pay | Admitting: General Practice

## 2014-12-24 ENCOUNTER — Encounter: Payer: Self-pay | Admitting: Family Medicine

## 2014-12-24 MED ORDER — VITAMIN D (ERGOCALCIFEROL) 1.25 MG (50000 UNIT) PO CAPS: 50000.0000 [IU] | ORAL_CAPSULE | ORAL | Status: DC

## 2014-12-31 ENCOUNTER — Encounter: Payer: Self-pay | Admitting: Family Medicine

## 2015-01-01 ENCOUNTER — Encounter: Payer: Self-pay | Admitting: Family Medicine

## 2015-01-15 ENCOUNTER — Encounter: Payer: Self-pay | Admitting: Neurology

## 2015-01-15 ENCOUNTER — Ambulatory Visit (INDEPENDENT_AMBULATORY_CARE_PROVIDER_SITE_OTHER): Payer: 59 | Admitting: Neurology

## 2015-01-15 VITALS — BP 118/80 | HR 66 | Ht 65.5 in | Wt 312.4 lb

## 2015-01-15 DIAGNOSIS — E0842 Diabetes mellitus due to underlying condition with diabetic polyneuropathy: Secondary | ICD-10-CM

## 2015-01-15 DIAGNOSIS — R202 Paresthesia of skin: Secondary | ICD-10-CM

## 2015-01-15 LAB — VITAMIN B12: Vitamin B-12: 677 pg/mL (ref 211–911)

## 2015-01-15 MED ORDER — GABAPENTIN 300 MG PO CAPS: ORAL_CAPSULE | ORAL | Status: DC
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
## 2015-01-15 NOTE — Patient Instructions (Signed)
1.  Start gabapentin 300mg  at bedtime for one week, then increase to 1 tablet twice daily 2.  EMG of the right arm and leg 3.  Check blood work  4.  Return to clinic in 2 months

## 2015-01-16 ENCOUNTER — Ambulatory Visit (INDEPENDENT_AMBULATORY_CARE_PROVIDER_SITE_OTHER): Payer: 59 | Admitting: Family Medicine

## 2015-01-16 ENCOUNTER — Other Ambulatory Visit: Payer: Self-pay | Admitting: General Practice

## 2015-01-16 ENCOUNTER — Encounter: Payer: Self-pay | Admitting: Family Medicine

## 2015-01-16 VITALS — BP 120/80 | HR 71 | Temp 98.2°F | Resp 16 | Wt 312.4 lb

## 2015-01-16 DIAGNOSIS — Z23 Encounter for immunization: Secondary | ICD-10-CM

## 2015-01-16 DIAGNOSIS — E119 Type 2 diabetes mellitus without complications: Secondary | ICD-10-CM

## 2015-01-16 LAB — ENA 9 PANEL
Centromere Ab Screen: <1
ENA SM AB SER-ACNC: NEGATIVE
Jo-1 Antibody, IgG: <1
Ribosomal P Protein Ab: <1
SM/RNP: <1
SSA (Ro) (ENA) Antibody, IgG: 1 — ABNORMAL HIGH
SSB (LA) (ENA) ANTIBODY, IGG: NEGATIVE
Scleroderma (Scl-70) (ENA) Antibody, IgG: <1
ds DNA Ab: 1 IU/mL

## 2015-01-16 LAB — SEDIMENTATION RATE: SED RATE: 36 mm/h — AB (ref 0–20)

## 2015-01-16 LAB — ANA: ANA: NEGATIVE

## 2015-01-16 MED ORDER — GLUCOSE BLOOD VI STRP: ORAL_STRIP | Status: DC

## 2015-01-16 MED ORDER — LANCETS MISC
Status: DC
Start: 2015-01-16 — End: 2015-12-19

## 2015-01-16 MED ORDER — ONETOUCH DELICA LANCETS 33G MISC: Status: DC

## 2015-01-16 MED ORDER — BAYER CONTOUR NEXT EZ W/DEVICE KIT: PACK | Status: DC

## 2015-01-16 NOTE — Patient Instructions (Signed)
Follow up in 2 months to recheck sugar Check your sugar if you're feeling poorly- otherwise spot check a few times a week to have an idea of what your sugar is running Keep up the good work on [employer]!!  Try and add regular exercise Schedule a yearly eye exam Call with any questions or concerns Hang in there!!

## 2015-01-16 NOTE — Progress Notes (Signed)
Pre visit review using our clinic review tool, if applicable. No additional management support is needed unless otherwise documented below in the visit note. 

## 2015-01-16 NOTE — Progress Notes (Signed)
   Subjective:    Patient ID: Vanessa Reeves, female    DOB: 05/30/1971, 44 y.o.   MRN: [id number]  HPI Diabetes- pt's recent labs from 12/14/14 show that pt's A1C is now 6.5 making her officially diabetic.  She returns to office today w/ questions.  Wants to know if she needs medication.  Pt is asking for a glucometer.  Pt has joinws [employer] to be proactive in her weight and sugar management.   Review of Systems For ROS see HPI     Objective:   Physical Exam  Constitutional: She is oriented to person, place, and time. She appears well-developed and well-nourished. No distress.  obese  HENT:  Head: Normocephalic and atraumatic.  Neurological: She is alert and oriented to person, place, and time.  Skin: Skin is warm and dry.  Psychiatric: She has a normal mood and affect. Her behavior is normal. Thought content normal.  Vitals reviewed.         Assessment & Plan:

## 2015-01-17 LAB — COPPER, SERUM: Copper: 149 ug/dL (ref 70–175)

## 2015-01-18 ENCOUNTER — Encounter: Payer: Self-pay | Admitting: Family Medicine

## 2015-01-18 ENCOUNTER — Encounter: Payer: Self-pay | Admitting: Neurology

## 2015-01-20 NOTE — Assessment & Plan Note (Signed)
New dx for pt.  Reviewed dx, long term implications, possible side effects, and need for low carb diet and regular exercise.  Pt already on ARB.  Discussed need for yearly eye exams.  Pt was given glucometer as well as information on low carb diet and diabetes in general.  All questions were answered.  Applauded her decision to join [employer].  Will follow closely.

## 2015-01-21 ENCOUNTER — Encounter: Payer: Self-pay | Admitting: Family Medicine

## 2015-01-24 ENCOUNTER — Other Ambulatory Visit: Payer: Self-pay | Admitting: Family Medicine

## 2015-01-24 NOTE — Telephone Encounter (Signed)
Med filled.  

## 2015-02-01 ENCOUNTER — Encounter: Payer: Self-pay | Admitting: Family Medicine

## 2015-02-05 ENCOUNTER — Encounter: Payer: Self-pay | Admitting: Family Medicine

## 2015-02-08 ENCOUNTER — Encounter: Payer: Self-pay | Admitting: Family Medicine

## 2015-02-12 ENCOUNTER — Encounter: Payer: Self-pay | Admitting: Family Medicine

## 2015-02-13 ENCOUNTER — Encounter: Payer: Self-pay | Admitting: General Practice

## 2015-02-18 ENCOUNTER — Ambulatory Visit (INDEPENDENT_AMBULATORY_CARE_PROVIDER_SITE_OTHER): Payer: 59 | Admitting: Neurology

## 2015-02-18 DIAGNOSIS — E0842 Diabetes mellitus due to underlying condition with diabetic polyneuropathy: Secondary | ICD-10-CM

## 2015-02-18 DIAGNOSIS — R202 Paresthesia of skin: Secondary | ICD-10-CM

## 2015-02-18 NOTE — Procedures (Signed)
[facility]  [address]  [place], [place] [address] Tel: [phone] Fax:  [phone] Test Date:  02/18/2015  Patient: Vanessa Reeves DOB: 1971/01/18 Physician: Narda Amber, DO  Sex: Female Height: 5\' 5"  Ref Phys: Narda Amber  ID#: [id number] Temp: 35.0C Technician: Laureen Ochs R. NCS T.   Patient Complaints: Patient is a 44 year old female here for evaluation of her right and bilateral feet paresthesisas.  NCV & EMG Findings: Electrodiagnostic testing of the right upper and lower extremity with additional studies of the left lower extremity is limited to the nerve conduction portion, as patient was unable to tolerate needle electrode examination. 1. Right median, ulnar, palmar, sural, and superficial peroneal sensory responses are within normal limits. 2. Right median and ulnar motor responses are within normal limits. 3. Right H reflex study is within normal limits. 4. Right peroneal motor responses are within normal limits. Bilateral tibial motor responses are symmetrically borderline low and may be normal for patient.  Proximal stimulation of the tibial nerve was reduced, and most likely technical in nature. 5. Needle electrode examination was prematurely terminated due to patient intolerance of testing. No meaningful diagnostic conclusions can be made.  Impression: 1. There is no evidence of a generalized sensorimotor polyneuropathy or carpal tunnel syndrome affecting the right side. 2. Because needle electrode examination was prematurely terminated due to intolerance of testing, a lumbosacral radiculopathy affecting the right lower extremity cannot be excluded.   ___________________________ Narda Amber, DO    Nerve Conduction Studies Anti Sensory Summary Table   Site NR Peak (ms) Norm Peak (ms) P-T Amp (V) Norm P-T Amp  Right Median Anti Sensory (2nd Digit)  35C  Wrist    2.6 <3.4 51.8 >20  Right Sup Peroneal Anti Sensory (Ant Lat Mall)    12 cm    2.3 <4.5 14.3 >5  Right Sural Anti Sensory (Lat Mall)  35C  Calf    3.3 <4.5 12.3 >5  Right Ulnar Anti Sensory (5th Digit)  35C  Wrist    2.4 <3.1 38.0 >12   Motor Summary Table   Site NR Onset (ms) Norm Onset (ms) O-P Amp (mV) Norm O-P Amp Site1 Site2 Delta-0 (ms) Dist (cm) Vel (m/s) Norm Vel (m/s)  Right Median Motor (Abd Poll Brev)  35C  Wrist    2.6 <3.9 14.1 >6 Elbow Wrist 4.2 26.0 62 >50  Elbow    6.8  10.9         Right Peroneal Motor (Ext Dig Brev)  Ankle    3.2 <5.5 6.0 >3 B Fib Ankle 6.6 33.0 50 >40  B Fib    9.8  5.4  Poplt B Fib 1.5 7.5 50 >40  Poplt    11.3  5.3         Right Peroneal TA Motor (Tib Ant)  35C  Fib Head    2.0 <4.0 5.2 >4 Poplit Fib Head 1.4 8.0 57 >40  Poplit    3.4  4.8         Left Tibial Motor (Abd Hall Brev)  35C    Paient did not tolerate proximal stimulation  Ankle    5.5 <6.0 7.1 >8 Knee Ankle 8.2 42.0 51 >40  Knee    13.7  0.8         Right Tibial Motor (Abd Hall Brev)  35C  Ankle    4.8 <6.0 7.6 >8 Knee Ankle 7.9 39.0 49 >40  Knee  12.7  5.6         Right Ulnar Motor (Abd Dig Minimi)  35C  Wrist    2.0 <3.1 8.7 >7 B Elbow Wrist 3.6 24.0 67 >50  B Elbow    5.6  7.8  A Elbow B Elbow 1.4 10.0 71 >50  A Elbow    7.0  7.2          Comparison Summary Table   Site NR Peak (ms) Norm Peak (ms) P-T Amp (V) Site1 Site2 Delta-P (ms) Norm Delta (ms)  Right Median/Ulnar Palm Comparison (Wrist - 8cm)  35C  Median Palm    1.7 <2.2 60.7 Median Palm Ulnar Palm 0.2   Ulnar Palm    1.5 <2.2 25.5       H Reflex Studies   NR H-Lat (ms) Lat Norm (ms) L-R H-Lat (ms)  Right Tibial (Gastroc)  35C     31.84 <35    EMG   Side Muscle Ins Act Fibs Psw Fasc Number Recrt Dur Dur. Amp Amp. Poly Poly. Comment  Right AntTibialis Nml Nml Nml Nml Nml Nml Nml Nml Nml Nml Nml Nml N/A      Waveforms:

## 2015-02-19 ENCOUNTER — Encounter: Payer: Self-pay | Admitting: Family Medicine

## 2015-02-20 ENCOUNTER — Telehealth: Payer: Self-pay | Admitting: Family Medicine

## 2015-02-20 NOTE — Telephone Encounter (Signed)
Relation to pt: self  Call back number:[phone]   Reason for call:  Pt was transferred to team health due to pt stating her blood sugar levels were off and in need of clinical advice. Pt denied seeing any other physican today. Pt hung up before being transferred to team health was on hold for 10 minutes. Please follow up. Thank you

## 2015-02-20 NOTE — Telephone Encounter (Signed)
Patient states her blood sugars are running high in the mornings before breakfast.  They are140's-160's (148 this morning).  Later in the day when she checks it has decreased (119 yesterday), but she feels scared to eat much when they are high while fasting.  She is having headaches and blurry vision when they are high.   Please advise.

## 2015-02-20 NOTE — Telephone Encounter (Signed)
Appointment scheduled 02/21/15 at 10:45.

## 2015-02-20 NOTE — Telephone Encounter (Signed)
Pt needs to schedule an appt to discuss readings and review recommendations

## 2015-02-21 ENCOUNTER — Ambulatory Visit: Payer: 59 | Admitting: Family Medicine

## 2015-02-22 ENCOUNTER — Encounter: Payer: Self-pay | Admitting: Family Medicine

## 2015-02-22 ENCOUNTER — Other Ambulatory Visit: Payer: Self-pay | Admitting: General Practice

## 2015-02-22 ENCOUNTER — Ambulatory Visit (INDEPENDENT_AMBULATORY_CARE_PROVIDER_SITE_OTHER): Payer: 59 | Admitting: Family Medicine

## 2015-02-22 VITALS — BP 124/78 | HR 75 | Temp 97.9°F | Resp 16 | Wt 315.0 lb

## 2015-02-22 DIAGNOSIS — E119 Type 2 diabetes mellitus without complications: Secondary | ICD-10-CM

## 2015-02-22 MED ORDER — METFORMIN HCL ER (MOD) 500 MG PO TB24: 500.0000 mg | ORAL_TABLET | Freq: Every day | ORAL | Status: DC

## 2015-02-22 MED ORDER — METFORMIN HCL ER 500 MG PO TB24: 500.0000 mg | ORAL_TABLET | Freq: Every day | ORAL | Status: DC

## 2015-02-22 NOTE — Progress Notes (Signed)
Pre visit review using our clinic review tool, if applicable. No additional management support is needed unless otherwise documented below in the visit note. 

## 2015-02-22 NOTE — Patient Instructions (Signed)
Follow up as scheduled Start the Metformin once daily in the AM Fasting sugars should be <150 and after eating sugars should be 2 hrs after eating for an accurate reading Increase protein and veggies, limit carbs Call with any questions or concerns Hang in there!!!

## 2015-02-22 NOTE — Assessment & Plan Note (Signed)
Relatively new problem for pt.  She is concerned about her recent blood sugars.  Explained that labile sugars are part of being diabetic and are directly influenced by what she eats.  Reviewed what carbohydrates are and the importance of a low carb diet.  Pt is interested in starting low dose Metformin to both control sugars and possibly help w/ weight loss.  Will follow up in 1 month as scheduled for repeat A1C.  Pt expressed understanding and is in agreement w/ plan.

## 2015-02-22 NOTE — Progress Notes (Signed)
   Subjective:    Patient ID: Vanessa Reeves, female    DOB: 09/01/1971, 44 y.o.   MRN: [id number]  HPI Diabetes- pt's A1C was 6.5 on 2/12.  Pt reports CBGs are 'all over the place'.  At one point sugar was 260.  Pt reports she has changed her portion size but not her food choices.  Pt works overnight shifts.  Fasting CBGs are 130-150.  1 hr after eating sugars will be 200-220.   Review of Systems For ROS see HPI     Objective:   Physical Exam  Constitutional: She is oriented to person, place, and time. She appears well-developed and well-nourished. No distress.  obese  HENT:  Head: Normocephalic and atraumatic.  Eyes: Conjunctivae and EOM are normal. Pupils are equal, round, and reactive to light.  Pulmonary/Chest: No respiratory distress.  Neurological: She is alert and oriented to person, place, and time.  Skin: Skin is warm and dry.  Psychiatric: She has a normal mood and affect. Her behavior is normal. Thought content normal.  Vitals reviewed.         Assessment & Plan:

## 2015-02-25 ENCOUNTER — Other Ambulatory Visit: Payer: Self-pay | Admitting: Family Medicine

## 2015-02-25 NOTE — Telephone Encounter (Signed)
Med filled.  

## 2015-03-07 ENCOUNTER — Encounter: Payer: Self-pay | Admitting: Family Medicine

## 2015-03-07 MED ORDER — VALACYCLOVIR HCL 1 G PO TABS: 1000.0000 mg | ORAL_TABLET | Freq: Two times a day (BID) | ORAL | Status: DC

## 2015-03-08 ENCOUNTER — Other Ambulatory Visit: Payer: Self-pay | Admitting: Family Medicine

## 2015-03-08 NOTE — Telephone Encounter (Signed)
Med filled.  

## 2015-03-22 ENCOUNTER — Ambulatory Visit: Payer: 59 | Admitting: Family Medicine

## 2015-04-12 ENCOUNTER — Other Ambulatory Visit: Payer: Self-pay | Admitting: Family Medicine

## 2015-04-12 NOTE — Telephone Encounter (Signed)
Rx sent to the pharmacy by e-script.//AB/CMA 

## 2015-04-15 ENCOUNTER — Ambulatory Visit: Payer: 59 | Admitting: Family Medicine

## 2015-04-22 ENCOUNTER — Other Ambulatory Visit: Payer: Self-pay | Admitting: Family Medicine

## 2015-04-22 NOTE — Telephone Encounter (Signed)
Med filled.  

## 2015-04-23 ENCOUNTER — Ambulatory Visit: Payer: 59 | Admitting: Neurology

## 2015-04-26 ENCOUNTER — Ambulatory Visit: Payer: Self-pay | Admitting: Family Medicine

## 2015-05-16 ENCOUNTER — Other Ambulatory Visit: Payer: Self-pay | Admitting: Family Medicine

## 2015-05-17 NOTE — Telephone Encounter (Signed)
Med filled.  

## 2015-05-21 ENCOUNTER — Encounter: Payer: Self-pay | Admitting: Family Medicine

## 2015-06-07 ENCOUNTER — Encounter: Payer: Self-pay | Admitting: Family Medicine

## 2015-06-07 ENCOUNTER — Ambulatory Visit (INDEPENDENT_AMBULATORY_CARE_PROVIDER_SITE_OTHER): Payer: 59 | Admitting: Family Medicine

## 2015-06-07 VITALS — BP 124/74 | HR 70 | Temp 98.3°F | Resp 16 | Ht 64.0 in | Wt 305.6 lb

## 2015-06-07 DIAGNOSIS — I10 Essential (primary) hypertension: Secondary | ICD-10-CM | POA: Diagnosis not present

## 2015-06-07 DIAGNOSIS — Z202 Contact with and (suspected) exposure to infections with a predominantly sexual mode of transmission: Secondary | ICD-10-CM

## 2015-06-07 DIAGNOSIS — F411 Generalized anxiety disorder: Secondary | ICD-10-CM | POA: Diagnosis not present

## 2015-06-07 DIAGNOSIS — E119 Type 2 diabetes mellitus without complications: Secondary | ICD-10-CM | POA: Diagnosis not present

## 2015-06-07 LAB — BASIC METABOLIC PANEL
BUN: 11 mg/dL (ref 6–23)
CALCIUM: 9.6 mg/dL (ref 8.4–10.5)
CO2: 28 meq/L (ref 19–32)
Chloride: 101 mEq/L (ref 96–112)
Creatinine, Ser: 0.92 mg/dL (ref 0.40–1.20)
GFR: 85.84 mL/min (ref >60.00)
Glucose, Bld: 146 mg/dL — ABNORMAL HIGH (ref 70–99)
POTASSIUM: 3.4 meq/L — AB (ref 3.5–5.1)
Sodium: 137 mEq/L (ref 135–145)

## 2015-06-07 LAB — CBC WITH DIFFERENTIAL/PLATELET
BASOS PCT: 0.4 % (ref 0.0–3.0)
Basophils Absolute: 0 10*3/uL (ref 0.0–0.1)
EOS PCT: 1.6 % (ref 0.0–5.0)
Eosinophils Absolute: 0.1 10*3/uL (ref 0.0–0.7)
HCT: 36.2 % (ref 36.0–46.0)
Hemoglobin: 11.9 g/dL — ABNORMAL LOW (ref 12.0–15.0)
LYMPHS ABS: 2.9 10*3/uL (ref 0.7–4.0)
Lymphocytes Relative: 37.3 % (ref 12.0–46.0)
MCHC: 32.8 g/dL (ref 30.0–36.0)
MCV: 85.7 fl (ref 78.0–100.0)
MONO ABS: 0.4 10*3/uL (ref 0.1–1.0)
Monocytes Relative: 5.7 % (ref 3.0–12.0)
NEUTROS ABS: 4.3 10*3/uL (ref 1.4–7.7)
Neutrophils Relative %: 55 % (ref 43.0–77.0)
PLATELETS: 413 10*3/uL — AB (ref 150.0–400.0)
RBC: 4.22 Mil/uL (ref 3.87–5.11)
RDW: 15.7 % — ABNORMAL HIGH (ref 11.5–15.5)
WBC: 7.8 10*3/uL (ref 4.0–10.5)

## 2015-06-07 LAB — HEMOGLOBIN A1C: Hgb A1c MFr Bld: 6.2 % (ref 4.6–6.5)

## 2015-06-07 LAB — TSH: TSH: 2.12 u[IU]/mL (ref 0.35–4.50)

## 2015-06-07 MED ORDER — DESONIDE 0.05 % EX CREA: TOPICAL_CREAM | Freq: Two times a day (BID) | CUTANEOUS | Status: DC

## 2015-06-07 MED ORDER — FUROSEMIDE 20 MG PO TABS: 20.0000 mg | ORAL_TABLET | Freq: Every day | ORAL | Status: DC

## 2015-06-07 MED ORDER — ALPRAZOLAM 0.5 MG PO TABS: 0.5000 mg | ORAL_TABLET | Freq: Two times a day (BID) | ORAL | Status: DC | PRN

## 2015-06-07 MED ORDER — FLUTICASONE PROPIONATE 50 MCG/ACT NA SUSP: 2.0000 | Freq: Every day | NASAL | Status: DC

## 2015-06-07 MED ORDER — ATENOLOL 50 MG PO TABS: 100.0000 mg | ORAL_TABLET | Freq: Every day | ORAL | Status: DC

## 2015-06-07 MED ORDER — POTASSIUM CHLORIDE CRYS ER 20 MEQ PO TBCR: 20.0000 meq | EXTENDED_RELEASE_TABLET | Freq: Every day | ORAL | Status: DC

## 2015-06-07 NOTE — Progress Notes (Signed)
   Subjective:    Patient ID: Vanessa Reeves, female    DOB: 1971-10-02, 44 y.o.   MRN: [id number]  HPI DM- new problem for pt.  On Metformin daily.  UTD on foot and eye exam.  On ARB for renal protection.  Has lost 10 lbs.  Pt reports healthier food choices- stopped soda, eating less overall.  Walking more.  Denies symptomatic lows.  Denies numbness/tingling of hands/feet.  HTN- chronic problem, on Atenolol and Losartan HCTZ daily.  BP is well controlled.  No CP, SOB above baseline, HAs above baseline, visual changes, edema.  Anxiety- pt has hx of this, previously on alprazolam.  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  Possible STD exposure- husband cheated and pt would like to be tested   Review of Systems For ROS see HPI     Objective:   Physical Exam  Constitutional: She is oriented to person, place, and time. She appears well-developed and well-nourished. No distress.  HENT:  Head: Normocephalic and atraumatic.  Eyes: Conjunctivae and EOM are normal. Pupils are equal, round, and reactive to light.  Neck: Normal range of motion. Neck supple. No thyromegaly present.  Cardiovascular: Normal rate, regular rhythm, normal heart sounds and intact distal pulses.   No murmur heard. Pulmonary/Chest: Effort normal and breath sounds normal. No respiratory distress.  Abdominal: Soft. She exhibits no distension. There is no tenderness.  Musculoskeletal: She exhibits no edema.  Lymphadenopathy:    She has no cervical adenopathy.  Neurological: She is alert and oriented to person, place, and time.  Skin: Skin is warm and dry.  Psychiatric: She has a normal mood and affect. Her behavior is normal.  Vitals reviewed.         Assessment & Plan:

## 2015-06-07 NOTE — Assessment & Plan Note (Signed)
Chronic problem.  Well controlled.  Asymptomatic.  Check labs.  No anticipated med changes.  Will follow. 

## 2015-06-07 NOTE — Assessment & Plan Note (Signed)
Check labs due to husband's recent infidelity.

## 2015-06-07 NOTE — Assessment & Plan Note (Signed)
Deteriorated.  Pt is again feeling overwhelmed, particularly in setting of husband's infidelity.  Will refill alprazolam and monitor.

## 2015-06-07 NOTE — Assessment & Plan Note (Signed)
Ongoing issue.  Pt is tolerating Metformin.  Has lost 10 lbs since last visit.  Is on ARB for renal protection.  UTD on eye and foot exams.  Check labs.  Adjust meds prn

## 2015-06-07 NOTE — Patient Instructions (Signed)
Follow up in 3-4 months to recheck diabetes We'll notify you of your lab results and make any changes if needed Keep up the good work on healthy diet and regular activity- it's working!!! Call with any questions or concerns Enjoy the rest of your summer!!!

## 2015-06-07 NOTE — Progress Notes (Signed)
Pre visit review using our clinic review tool, if applicable. No additional management support is needed unless otherwise documented below in the visit note. 

## 2015-06-08 LAB — GC/CHLAMYDIA PROBE AMP, URINE
Chlamydia, Swab/Urine, PCR: NEGATIVE
GC Probe Amp, Urine: NEGATIVE

## 2015-06-08 LAB — HIV ANTIBODY (ROUTINE TESTING W REFLEX): HIV 1&2 Ab, 4th Generation: NONREACTIVE

## 2015-06-08 LAB — RPR

## 2015-06-13 ENCOUNTER — Encounter (HOSPITAL_BASED_OUTPATIENT_CLINIC_OR_DEPARTMENT_OTHER): Payer: Self-pay | Admitting: *Deleted

## 2015-06-13 ENCOUNTER — Emergency Department (HOSPITAL_BASED_OUTPATIENT_CLINIC_OR_DEPARTMENT_OTHER)
Admission: EM | Admit: 2015-06-13 | Discharge: 2015-06-13 | Disposition: A | Discharge status: Home / Self Care | Payer: 59 | Attending: Emergency Medicine | Admitting: Emergency Medicine

## 2015-06-13 ENCOUNTER — Emergency Department (HOSPITAL_BASED_OUTPATIENT_CLINIC_OR_DEPARTMENT_OTHER): Payer: 59

## 2015-06-13 DIAGNOSIS — Z880 Allergy status to penicillin: Secondary | ICD-10-CM | POA: Diagnosis not present

## 2015-06-13 DIAGNOSIS — Z87828 Personal history of other (healed) physical injury and trauma: Secondary | ICD-10-CM | POA: Insufficient documentation

## 2015-06-13 DIAGNOSIS — E119 Type 2 diabetes mellitus without complications: Secondary | ICD-10-CM | POA: Diagnosis not present

## 2015-06-13 DIAGNOSIS — Z87891 Personal history of nicotine dependence: Secondary | ICD-10-CM | POA: Diagnosis not present

## 2015-06-13 DIAGNOSIS — Z79899 Other long term (current) drug therapy: Secondary | ICD-10-CM | POA: Insufficient documentation

## 2015-06-13 DIAGNOSIS — Z791 Long term (current) use of non-steroidal anti-inflammatories (NSAID): Secondary | ICD-10-CM | POA: Insufficient documentation

## 2015-06-13 DIAGNOSIS — I10 Essential (primary) hypertension: Secondary | ICD-10-CM | POA: Insufficient documentation

## 2015-06-13 DIAGNOSIS — M25572 Pain in left ankle and joints of left foot: Secondary | ICD-10-CM | POA: Diagnosis present

## 2015-06-13 DIAGNOSIS — F419 Anxiety disorder, unspecified: Secondary | ICD-10-CM | POA: Insufficient documentation

## 2015-06-13 MED ORDER — IBUPROFEN 600 MG PO TABS: 600.0000 mg | ORAL_TABLET | Freq: Three times a day (TID) | ORAL | Status: DC | PRN

## 2015-06-13 MED ORDER — IBUPROFEN 400 MG PO TABS
600.0000 mg | ORAL_TABLET | Freq: Once | ORAL | Status: AC
  Administered 2015-06-13: 600 mg via ORAL
  Filled 2015-06-13 (×2): qty 1

## 2015-06-13 NOTE — ED Notes (Signed)
Left ankle pain and swelling. No recent injury.

## 2015-06-13 NOTE — ED Provider Notes (Signed)
CSN: [id number]     Arrival date & time 06/13/15  1131 History   First MD Initiated Contact with Patient 06/13/15 1339     Chief Complaint  Patient presents with  . Ankle Pain     (Consider location/radiation/quality/duration/timing/severity/associated sxs/prior Treatment) Patient is a 44 y.o. female presenting with ankle pain.  Ankle Pain Location:  Ankle Injury: yes (Injured this ankle many years ago.)   Ankle location:  L ankle Pain details:    Quality:  Throbbing   Radiates to:  Does not radiate   Severity:  Moderate   Onset quality:  Gradual   Duration: Off and on. Worse today. .   Timing:  Intermittent Chronicity:  Recurrent Relieved by:  NSAIDs Worsened by:  Bearing weight Associated symptoms: swelling (worsen at the end of the day after being on feet all day.)   Associated symptoms: no decreased ROM, no muscle weakness and no numbness     Past Medical History  Diagnosis Date  . Hypertension   . Anxiety   . Diabetes mellitus without complication    Past Surgical History  Procedure Laterality Date  . Tubal ligation    . Dilation and curettage of uterus    . Abdominal hysterectomy     Family History  Problem Relation Age of Onset  . Hypertension Mother   . Hypertension Paternal Grandmother   . Diabetes Mellitus II Paternal Grandmother   . Stroke Father   . Healthy Brother   . Healthy Daughter   . Healthy Son    Social History  Substance Use Topics  . Smoking status: Former Research scientist (life sciences)  . Smokeless tobacco: None  . Alcohol Use: 0.0 oz/week    0 Standard drinks or equivalent per week     Comment: socially   OB History    Gravida Para Term Preterm AB TAB SAB Ectopic Multiple Living   '5 3 2 1 2 1 1        '$ Review of Systems  All other systems reviewed and are negative.     Allergies  Penicillins  Home Medications   Prior to Admission medications   Medication Sig Start Date End Date Taking? Authorizing Provider  acetaminophen (TYLENOL) 325 MG  tablet Take 650 mg by mouth every 6 (six) hours as needed.    Historical Provider, MD  ALPRAZolam Duanne Moron) 0.5 MG tablet Take 1 tablet (0.5 mg total) by mouth 2 (two) times daily as needed for anxiety. 06/07/15   Midge Minium, MD  atenolol (TENORMIN) 50 MG tablet Take 2 tablets (100 mg total) by mouth at bedtime. 06/07/15   Midge Minium, MD  atomoxetine (STRATTERA) 25 MG capsule Take 25 mg by mouth daily.    Historical Provider, MD  Blood Glucose Monitoring Suppl (CONTOUR NEXT EZ MONITOR) W/DEVICE KIT Please use monitor to test sugar levels once daily. Dx. E11.9 01/16/15   Midge Minium, MD  desonide (DESOWEN) 0.05 % cream Apply topically 2 (two) times daily. 06/07/15   Midge Minium, MD  fluticasone (FLONASE) 50 MCG/ACT nasal spray Place 2 sprays into both nostrils daily. 06/07/15   Midge Minium, MD  furosemide (LASIX) 20 MG tablet Take 1 tablet (20 mg total) by mouth daily. 06/07/15   Midge Minium, MD  gabapentin (NEURONTIN) 300 MG capsule Take 1 tablet at bedtime for one week, then increase to 1 tablet twice daily. 01/15/15   Donika K Patel, DO  glucose blood (BAYER CONTOUR TEST) test strip Please use one strip  to test glucose levels daily. Dx. E11.9 01/16/15   Midge Minium, MD  Lancets MISC Please use one lancet each time sugars are tested. Pt tests once daily. Dx. E11.9 01/16/15   Midge Minium, MD  losartan-hydrochlorothiazide (HYZAAR) 100-25 MG per tablet TAKE 1 TABLET BY MOUTH DAILY. 04/12/15   Midge Minium, MD  meloxicam (MOBIC) 15 MG tablet TAKE 1 TABLET BY MOUTH ONCE DAILY 02/25/15   Midge Minium, MD  metFORMIN (GLUCOPHAGE-XR) 500 MG 24 hr tablet Take 1 tablet (500 mg total) by mouth daily with breakfast. 02/22/15   Midge Minium, MD  potassium chloride SA (K-DUR,KLOR-CON) 20 MEQ tablet Take 1 tablet (20 mEq total) by mouth daily. 06/07/15   Midge Minium, MD  SSS 10-5 10-5 % FOAM APPLY TO THE AFFECTED AREA ONCE DAILY 05/17/15   Midge Minium, MD  valACYclovir (VALTREX) 1000 MG tablet Take 1 tablet (1,000 mg total) by mouth 2 (two) times daily. 03/07/15   Midge Minium, MD  Vitamin D, Ergocalciferol, (DRISDOL) 50000 UNITS CAPS capsule Take 1 capsule (50,000 Units total) by mouth every 7 (seven) days. 12/24/14   Midge Minium, MD   BP 130/68 mmHg  Pulse 77  Temp(Src) 98.3 F (36.8 C) (Oral)  Resp 18  Ht $R'5\' 4"'VH$  (1.626 m)  Wt 305 lb (138.347 kg)  BMI 52.33 kg/m2  SpO2 97%  LMP 12/04/2011 Physical Exam  Constitutional: She is oriented to person, place, and time. She appears well-developed and well-nourished. No distress.  HENT:  Head: Normocephalic and atraumatic.  Eyes: Conjunctivae are normal. No scleral icterus.  Neck: Neck supple.  Cardiovascular: Normal rate and intact distal pulses.   Pulmonary/Chest: Effort normal. No stridor. No respiratory distress.  Abdominal: Normal appearance. She exhibits no distension.  Musculoskeletal:       Left ankle: She exhibits swelling (Mild,  around lateral malleolus). She exhibits normal range of motion, no ecchymosis, no deformity and normal pulse. Tenderness. Lateral malleolus tenderness found.  Neurological: She is alert and oriented to person, place, and time.  Skin: Skin is warm and dry. No rash noted.  Psychiatric: She has a normal mood and affect. Her behavior is normal.  Nursing note and vitals reviewed.   ED Course  Procedures (including critical care time) Labs Review Labs Reviewed - No data to display  Imaging Review Dg Ankle Complete Left  06/13/2015   CLINICAL DATA:  Ankle sprain.  EXAM: LEFT ANKLE COMPLETE - 3+ VIEW  COMPARISON:  None.  FINDINGS: Diffuse soft tissue swelling is present. No acute bony or joint abnormality identified. Tibiotalar degenerative change noted. Subchondral cyst noted in the dome of the the talus, most likely degenerative. Mild sclerosis is noted along the dome of the talus most likely from degenerative change. Avascular necrosis  of the talus cannot be excluded. MRI of the left ankle can be obtained to further evaluate.  IMPRESSION: 1. Diffuse soft tissue swelling. No evidence of acute fracture dislocation . 2. Subchondral cyst and sclerosis noted in the dome of the talus, most likely degenerative. Avascular necrosis of the talus cannot be excluded. MRI of the left ankle can be obtained to further evaluate.   Electronically Signed   By: Marcello Moores  Register   On: 06/13/2015 12:51     EKG Interpretation None      MDM   Final diagnoses:  Left ankle pain    Patient has intermittent pain and swelling in her left ankle ever since an injury 13  years ago. Her x-ray today, or triage protocol, these negative for acute fracture. Shows an indistinct lesion of her talus. Advise she treat pain acutely with Ace wrap and NSAIDs. Advised she follow-up for further evaluation of her talar lesion.    Serita Grit, MD 06/13/15 1550

## 2015-06-13 NOTE — Discharge Instructions (Signed)

## 2015-06-13 NOTE — ED Notes (Signed)
MD at bedside. 

## 2015-06-24 ENCOUNTER — Other Ambulatory Visit: Payer: Self-pay | Admitting: Family Medicine

## 2015-06-24 NOTE — Telephone Encounter (Signed)
Medication filled to pharmacy as requested.   

## 2015-07-19 ENCOUNTER — Ambulatory Visit: Payer: 59

## 2015-07-26 ENCOUNTER — Ambulatory Visit: Payer: Self-pay | Admitting: Family Medicine

## 2015-07-26 ENCOUNTER — Ambulatory Visit (INDEPENDENT_AMBULATORY_CARE_PROVIDER_SITE_OTHER): Payer: 59

## 2015-07-26 DIAGNOSIS — Z299 Encounter for prophylactic measures, unspecified: Secondary | ICD-10-CM

## 2015-07-26 DIAGNOSIS — Z418 Encounter for other procedures for purposes other than remedying health state: Secondary | ICD-10-CM

## 2015-07-29 ENCOUNTER — Encounter: Payer: Self-pay | Admitting: *Deleted

## 2015-07-29 LAB — TB SKIN TEST
INDURATION: 0 mm
TB Skin Test: NEGATIVE

## 2015-08-19 ENCOUNTER — Ambulatory Visit (INDEPENDENT_AMBULATORY_CARE_PROVIDER_SITE_OTHER): Payer: 59 | Admitting: Family Medicine

## 2015-08-19 ENCOUNTER — Encounter: Payer: Self-pay | Admitting: Family Medicine

## 2015-08-19 VITALS — BP 122/82 | HR 62 | Temp 98.1°F | Resp 17 | Wt 300.2 lb

## 2015-08-19 DIAGNOSIS — H6981 Other specified disorders of Eustachian tube, right ear: Secondary | ICD-10-CM

## 2015-08-19 MED ORDER — CETIRIZINE HCL 10 MG PO TABS: 10.0000 mg | ORAL_TABLET | Freq: Every day | ORAL | Status: DC

## 2015-08-19 NOTE — Patient Instructions (Signed)
Follow up as needed Drink plenty of fluids Continue the Flonase daily Add the Zyrtec once daily to decrease the pressure and swelling Call with any questions or concerns If you want to join [place] at the new [place] office, any scheduled appointments will automatically transfer and we will see you at [address] [place] [address] [place], [place], [place] [address] Hang in there!!!

## 2015-08-19 NOTE — Assessment & Plan Note (Signed)
Pt's current ear pain is likely due to allergy congestion coupled w/ recent oral surgery causing increased swelling.  Pt to continue Flonase daily and antihistamine.  Encouraged plenty of fluids.  No evidence of infxn- no need for abx.  Reviewed supportive care and red flags that should prompt return.  Pt expressed understanding and is in agreement w/ plan.

## 2015-08-19 NOTE — Progress Notes (Signed)
Pre visit review using our clinic review tool, if applicable. No additional management support is needed unless otherwise documented below in the visit note. 

## 2015-08-19 NOTE — Progress Notes (Signed)
   Subjective:    Patient ID: Vanessa Reeves, female    DOB: [birth year], 44 y.o.   MRN: [id number]  HPI R ear pain- sxs started ~1 month ago when she had wisdom teeth pulled.  No fever.  Ear fulls wet- no purulent drainage noted.  Pain is not external, 'deep inside'.  No pain on L.  Denies nasal congestion or sinus pressure.  Using Flonase daily.   Review of Systems For ROS see HPI     Objective:   Physical Exam  Constitutional: She appears well-developed and well-nourished. No distress.  HENT:  Head: Normocephalic and atraumatic.  Right Ear: Tympanic membrane is retracted.  Left Ear: Tympanic membrane normal.  Nose: Mucosal edema and rhinorrhea present. Right sinus exhibits no maxillary sinus tenderness and no frontal sinus tenderness. Left sinus exhibits no maxillary sinus tenderness and no frontal sinus tenderness.  Mouth/Throat: Mucous membranes are normal. Posterior oropharyngeal erythema (w/ PND) present.  Eyes: Conjunctivae and EOM are normal. Pupils are equal, round, and reactive to light.  Neck: Normal range of motion. Neck supple.  Cardiovascular: Normal rate, regular rhythm and normal heart sounds.   Pulmonary/Chest: Effort normal and breath sounds normal. No respiratory distress. She has no wheezes. She has no rales.  Lymphadenopathy:    She has no cervical adenopathy.  Vitals reviewed.         Assessment & Plan:

## 2015-08-21 ENCOUNTER — Ambulatory Visit: Payer: Self-pay | Admitting: Family Medicine

## 2015-08-23 ENCOUNTER — Encounter: Payer: Self-pay | Admitting: Family Medicine

## 2015-08-26 ENCOUNTER — Telehealth: Payer: Self-pay | Admitting: *Deleted

## 2015-08-26 NOTE — Telephone Encounter (Signed)
FMLA forms received via fax from [employer] for intermittent FMLA due to anxiety. Filled out as much as possible and forwarded to Dr. Birdie Riddle. JG//CMA

## 2015-08-27 DIAGNOSIS — Z7689 Persons encountering health services in other specified circumstances: Secondary | ICD-10-CM

## 2015-08-27 NOTE — Telephone Encounter (Signed)
Completed/signed forms faxed to [employer] successfully. Sent for scanning. JG//CMA

## 2015-09-09 ENCOUNTER — Encounter: Payer: Self-pay | Admitting: Family Medicine
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
## 2015-09-24 ENCOUNTER — Other Ambulatory Visit: Payer: Self-pay | Admitting: Family Medicine

## 2015-09-24 ENCOUNTER — Encounter: Payer: Self-pay | Admitting: Family Medicine

## 2015-09-24 NOTE — Telephone Encounter (Signed)
Medication filled to pharmacy as requested.   

## 2015-10-01 ENCOUNTER — Encounter: Payer: Self-pay | Admitting: Family Medicine

## 2015-10-01 MED ORDER — FLUCONAZOLE 150 MG PO TABS: 150.0000 mg | ORAL_TABLET | Freq: Once | ORAL | Status: DC

## 2015-10-09 ENCOUNTER — Encounter: Payer: Self-pay | Admitting: Family Medicine

## 2015-10-14 ENCOUNTER — Encounter: Payer: Self-pay | Admitting: Family Medicine

## 2015-10-14 ENCOUNTER — Ambulatory Visit (INDEPENDENT_AMBULATORY_CARE_PROVIDER_SITE_OTHER): Payer: 59 | Admitting: Family Medicine

## 2015-10-14 VITALS — BP 126/78 | HR 63 | Temp 98.1°F | Resp 16 | Ht 64.0 in | Wt 303.1 lb

## 2015-10-14 DIAGNOSIS — E119 Type 2 diabetes mellitus without complications: Secondary | ICD-10-CM | POA: Diagnosis not present

## 2015-10-14 DIAGNOSIS — Z1231 Encounter for screening mammogram for malignant neoplasm of breast: Secondary | ICD-10-CM | POA: Diagnosis not present

## 2015-10-14 DIAGNOSIS — I10 Essential (primary) hypertension: Secondary | ICD-10-CM

## 2015-10-14 DIAGNOSIS — Z23 Encounter for immunization: Secondary | ICD-10-CM

## 2015-10-14 LAB — CBC WITH DIFFERENTIAL/PLATELET
BASOS ABS: 0 10*3/uL (ref 0.0–0.1)
BASOS PCT: 0.4 % (ref 0.0–3.0)
EOS PCT: 1.4 % (ref 0.0–5.0)
Eosinophils Absolute: 0.1 10*3/uL (ref 0.0–0.7)
HEMATOCRIT: 38.4 % (ref 36.0–46.0)
Hemoglobin: 12.5 g/dL (ref 12.0–15.0)
LYMPHS PCT: 43.8 % (ref 12.0–46.0)
Lymphs Abs: 3.3 10*3/uL (ref 0.7–4.0)
MCHC: 32.6 g/dL (ref 30.0–36.0)
MCV: 84.4 fl (ref 78.0–100.0)
MONOS PCT: 6.6 % (ref 3.0–12.0)
Monocytes Absolute: 0.5 10*3/uL (ref 0.1–1.0)
NEUTROS ABS: 3.6 10*3/uL (ref 1.4–7.7)
Neutrophils Relative %: 47.8 % (ref 43.0–77.0)
PLATELETS: 427 10*3/uL — AB (ref 150.0–400.0)
RBC: 4.55 Mil/uL (ref 3.87–5.11)
RDW: 15.8 % — ABNORMAL HIGH (ref 11.5–15.5)
WBC: 7.6 10*3/uL (ref 4.0–10.5)

## 2015-10-14 LAB — BASIC METABOLIC PANEL
BUN: 14 mg/dL (ref 6–23)
CALCIUM: 9.5 mg/dL (ref 8.4–10.5)
CHLORIDE: 100 meq/L (ref 96–112)
CO2: 28 meq/L (ref 19–32)
CREATININE: 0.94 mg/dL (ref 0.40–1.20)
GFR: 83.59 mL/min (ref >60.00)
GLUCOSE: 98 mg/dL (ref 70–99)
Potassium: 3.2 mEq/L — ABNORMAL LOW (ref 3.5–5.1)
Sodium: 136 mEq/L (ref 135–145)

## 2015-10-14 LAB — LIPID PANEL
CHOL/HDL RATIO: 3
Cholesterol: 132 mg/dL (ref 0–200)
HDL: 44.6 mg/dL (ref >39.00)
LDL CALC: 72 mg/dL (ref 0–99)
NONHDL: 87.39
Triglycerides: 75 mg/dL (ref 0.0–149.0)
VLDL: 15 mg/dL (ref 0.0–40.0)

## 2015-10-14 LAB — HEPATIC FUNCTION PANEL
ALK PHOS: 77 U/L (ref 39–117)
ALT: 11 U/L (ref 0–35)
AST: 12 U/L (ref 0–37)
Albumin: 3.9 g/dL (ref 3.5–5.2)
BILIRUBIN DIRECT: 0.1 mg/dL (ref 0.0–0.3)
BILIRUBIN TOTAL: 0.3 mg/dL (ref 0.2–1.2)
TOTAL PROTEIN: 8 g/dL (ref 6.0–8.3)

## 2015-10-14 LAB — HEMOGLOBIN A1C: Hgb A1c MFr Bld: 6.6 % — ABNORMAL HIGH (ref 4.6–6.5)

## 2015-10-14 MED ORDER — POTASSIUM CHLORIDE CRYS ER 20 MEQ PO TBCR: 40.0000 meq | EXTENDED_RELEASE_TABLET | Freq: Every day | ORAL | Status: DC

## 2015-10-14 NOTE — Addendum Note (Signed)
Addended by: Davis Gourd on: 10/14/2015 04:25 PM   Modules accepted: Orders, Medications

## 2015-10-14 NOTE — Assessment & Plan Note (Signed)
Ongoing issue for pt.  We recently had to hold her Metformin due to symptomatic lows.  Pt restarted 1 week ago and has not had additional lows.  Pt is UTD on eye exam, foot exam, on ARB for renal protection.  Check labs.  Adjust meds prn.  Stressed need for healthy diet and regular exercise.  Will continue to follow.

## 2015-10-14 NOTE — Progress Notes (Signed)
   Subjective:    Patient ID: Vanessa Reeves, female    DOB: 12-24-1970, 44 y.o.   MRN: [id number]  HPI DM- chronic problem, on Metformin.  On ARB for renal protection.  UTD on foot exam, eye exam.  Pt continues to gain weight- now 303 lbs.  Last A1C 6.2  Due to symptomatic lows, we had to hold pt's Metformin x1 week.  She restarted med 1 week ago w/o symptomatic lows.  CBG was 122 this AM w/o medication.  Pt reports that even w/o medication, her sugars only got to 150s.  Denies numbness/tingling of hands/ feet.  HTN- chronic problem, on Losartan HCTZ daily w/ good control.  No CP, SOB, visual changes, HAs.  Morbid obesity- ongoing issue for pt.  She has gained 3 lbs.  Admits to trouble w/ diet and exercise due to holiday season and finals.  Plans to do better.   Review of Systems For ROS see HPI     Objective:   Physical Exam  Constitutional: She is oriented to person, place, and time. She appears well-developed and well-nourished. No distress.  obesity  HENT:  Head: Normocephalic and atraumatic.  Eyes: Conjunctivae and EOM are normal. Pupils are equal, round, and reactive to light.  Neck: Normal range of motion. Neck supple. No thyromegaly present.  Cardiovascular: Normal rate, regular rhythm, normal heart sounds and intact distal pulses.   No murmur heard. Pulmonary/Chest: Effort normal and breath sounds normal. No respiratory distress.  Abdominal: Soft. She exhibits no distension. There is no tenderness.  Musculoskeletal: She exhibits no edema.  Lymphadenopathy:    She has no cervical adenopathy.  Neurological: She is alert and oriented to person, place, and time.  Skin: Skin is warm and dry.  Psychiatric: She has a normal mood and affect. Her behavior is normal.  Vitals reviewed.         Assessment & Plan:

## 2015-10-14 NOTE — Addendum Note (Signed)
Addended by: Davis Gourd on: 10/14/2015 11:37 AM   Modules accepted: Orders

## 2015-10-14 NOTE — Progress Notes (Signed)
Pre visit review using our clinic review tool, if applicable. No additional management support is needed unless otherwise documented below in the visit note. 

## 2015-10-14 NOTE — Assessment & Plan Note (Signed)
Chronic problem.  Adequate control.  Asymptomatic.  Check labs.  No anticipated med changes 

## 2015-10-14 NOTE — Patient Instructions (Signed)
Schedule your complete physical for Feb We'll notify you of your lab results and make any changes if needed Please try and work on healthy diet and regular exercise- you can do it!! Go downstairs and schedule your mammogram- the order is in! Call with any questions or concerns If you want to join [place] at the new [place] office, any scheduled appointments will automatically transfer and we will see you at [address] [place] [address] [place], [place] [address] ([place] 11/05/15) Happy Early Birthday! Happy Holidays!!!

## 2015-10-14 NOTE — Assessment & Plan Note (Signed)
Ongoing issue for pt.  She continues to gain weight.  Stressed need for healthy diet and regular exercise.  Will follow closely.

## 2015-10-24 ENCOUNTER — Ambulatory Visit (HOSPITAL_BASED_OUTPATIENT_CLINIC_OR_DEPARTMENT_OTHER): Payer: 59

## 2015-10-25 ENCOUNTER — Ambulatory Visit (HOSPITAL_BASED_OUTPATIENT_CLINIC_OR_DEPARTMENT_OTHER)
Admission: RE | Admit: 2015-10-25 | Discharge: 2015-10-25 | Disposition: A | Discharge status: Home / Self Care | Payer: 59 | Source: Ambulatory Visit | Attending: Family Medicine | Admitting: Family Medicine

## 2015-10-25 DIAGNOSIS — Z1231 Encounter for screening mammogram for malignant neoplasm of breast: Secondary | ICD-10-CM | POA: Insufficient documentation

## 2015-11-05 ENCOUNTER — Other Ambulatory Visit: Payer: Self-pay | Admitting: Family Medicine

## 2015-11-05 MED FILL — METFORMIN HCL ER 500 MG TAB: 500 | 30 days supply | Qty: 30 | Fill #0

## 2015-11-05 MED FILL — LOSARTAN-HCTZ 100-25 MG TAB: 100-25 | 30 days supply | Qty: 30 | Fill #0

## 2015-11-05 NOTE — Telephone Encounter (Signed)
Medication filled to pharmacy as requested.   

## 2015-11-07 MED FILL — FUROSEMIDE 20 MG TABLET: 20 | 30 days supply | Qty: 30 | Fill #2

## 2015-11-13 ENCOUNTER — Other Ambulatory Visit: Payer: Self-pay | Admitting: Family Medicine

## 2015-11-13 MED FILL — MELOXICAM 15 MG TABLET: 15 | 30 days supply | Qty: 30 | Fill #0

## 2015-11-13 MED FILL — SSS 10-5 FOAM: 10-5 | 30 days supply | Qty: 100 | Fill #1

## 2015-11-14 ENCOUNTER — Encounter: Payer: Self-pay | Admitting: Family Medicine

## 2015-11-14 DIAGNOSIS — E119 Type 2 diabetes mellitus without complications: Secondary | ICD-10-CM

## 2015-11-14 NOTE — Addendum Note (Signed)
Addended by: Midge Minium on: 11/14/2015 11:16 AM   Modules accepted: Orders

## 2015-11-15 ENCOUNTER — Encounter: Payer: Self-pay | Admitting: Family Medicine

## 2015-11-19 DIAGNOSIS — H52222 Regular astigmatism, left eye: Secondary | ICD-10-CM | POA: Diagnosis not present

## 2015-11-19 DIAGNOSIS — H524 Presbyopia: Secondary | ICD-10-CM | POA: Diagnosis not present

## 2015-11-19 LAB — HM DIABETES EYE EXAM

## 2015-11-20 ENCOUNTER — Ambulatory Visit (INDEPENDENT_AMBULATORY_CARE_PROVIDER_SITE_OTHER): Payer: 59 | Admitting: Family Medicine

## 2015-11-20 ENCOUNTER — Encounter: Payer: Self-pay | Admitting: Family Medicine

## 2015-11-20 VITALS — BP 132/100 | HR 69 | Temp 98.3°F | Ht 64.0 in | Wt 307.8 lb

## 2015-11-20 DIAGNOSIS — J0110 Acute frontal sinusitis, unspecified: Secondary | ICD-10-CM

## 2015-11-20 MED ORDER — PREDNISONE 10 MG PO TABS: ORAL_TABLET | ORAL | Status: DC

## 2015-11-20 MED FILL — predniSONE 10 MG TABS: 10 | 9 days supply | Qty: 18 | Fill #0

## 2015-11-20 MED FILL — ALL DAY ALLERGY 10 MG TAB: 10 | 100 days supply | Qty: 100 | Fill #0

## 2015-11-20 MED FILL — FLUTICASONE PROP 50 MCG SPR: 50 | 30 days supply | Qty: 16 | Fill #1

## 2015-11-20 NOTE — Progress Notes (Signed)
   Subjective:    Patient ID: Vanessa Reeves, female    DOB: October 05, 1971, 45 y.o.   MRN: [id number]  HPI HA- 'my body's just in a lot of pain'.  Pt injured her shoulder a few weeks ago.  HA started 1 week ago, 'it's been horrible'.  Taking Meloxicam and tylenol w/o relief.  No hx of migraines.  HA is centered behind L eye, described as a 'pounding'.  No photo or phonophobia.  + nausea, no vomiting.  Pain will come and go throughout the day.  No hx of similar.  Denies nasal/sinus congestion.  No fevers.  Pt is doing Zyrtec and Flonase daily.   Review of Systems For ROS see HPI     Objective:   Physical Exam  Constitutional: She is oriented to person, place, and time. She appears well-developed and well-nourished. No distress.  Morbidly obese  HENT:  Head: Normocephalic and atraumatic.  Right Ear: Tympanic membrane normal.  Left Ear: Tympanic membrane normal.  Nose: Mucosal edema and rhinorrhea present. Right sinus exhibits no maxillary sinus tenderness and no frontal sinus tenderness. Left sinus exhibits maxillary sinus tenderness. Left sinus exhibits no frontal sinus tenderness.  Mouth/Throat: Uvula is midline and mucous membranes are normal. Posterior oropharyngeal erythema present. No oropharyngeal exudate.  Eyes: Conjunctivae and EOM are normal. Pupils are equal, round, and reactive to light.  Neck: Normal range of motion. Neck supple.  Cardiovascular: Normal rate, regular rhythm and normal heart sounds.   Pulmonary/Chest: Effort normal and breath sounds normal. No respiratory distress. She has no wheezes.  Musculoskeletal: She exhibits tenderness (TTP over R trap).  Lymphadenopathy:    She has no cervical adenopathy.  Neurological: She is alert and oriented to person, place, and time.  Skin: Skin is warm and dry.  Psychiatric: She has a normal mood and affect. Her behavior is normal. Thought content normal.  Vitals reviewed.         Assessment & Plan:

## 2015-11-20 NOTE — Assessment & Plan Note (Signed)
Pt is exquisitely TTP over L maxillary sinus.  No other signs of bacterial infxn- fever, body aches, chills, sore throat.  Suspect that she has inflammation rather than infxn causing her sxs.  Start Pred taper.  Cautioned her that this will elevate her sugars and she needs to be careful w/ what she is eating.  Advised that this will also help her R shoulder pain/trap spasm.  Reviewed supportive care and red flags that should prompt return.  Pt expressed understanding and is in agreement w/ plan.

## 2015-11-20 NOTE — Patient Instructions (Signed)
Follow up as scheduled or as needed Drink plenty of fluids Start the Prednisone as directed (take w/ food)- this will help both the headache and the shoulder pain Use a heating pad for the shoulder pain/spasm Call with any questions or concerns- particularly if not improving Hang in there!!!

## 2015-11-20 NOTE — Progress Notes (Signed)
Pre visit review using our clinic review tool, if applicable. No additional management support is needed unless otherwise documented below in the visit note. 

## 2015-11-21 ENCOUNTER — Ambulatory Visit: Payer: 59 | Admitting: Family Medicine

## 2015-11-26 ENCOUNTER — Encounter: Payer: Self-pay | Admitting: General Practice

## 2015-12-05 ENCOUNTER — Other Ambulatory Visit: Payer: Self-pay | Admitting: Family Medicine

## 2015-12-05 MED FILL — METFORMIN HCL ER 500 MG TAB: 500 | 30 days supply | Qty: 30 | Fill #1

## 2015-12-05 NOTE — Telephone Encounter (Signed)
Last OV 11/20/15 Alprazolam last filled 06/07/15 #60 with 3

## 2015-12-06 MED FILL — ALPRAZolam 0.5 MG TABS: 0.5 | 30 days supply | Qty: 60 | Fill #0

## 2015-12-06 NOTE — Telephone Encounter (Signed)
Medication filled to pharmacy as requested.   

## 2015-12-13 MED FILL — LOSARTAN-HCTZ 100-25 MG TAB: 100-25 | 30 days supply | Qty: 30 | Fill #1

## 2015-12-18 ENCOUNTER — Telehealth: Payer: Self-pay | Admitting: Behavioral Health

## 2015-12-18 NOTE — Telephone Encounter (Signed)
Unable to reach patient at time of Pre-Visit Call.  Left message for patient to return call when available.    

## 2015-12-19 ENCOUNTER — Ambulatory Visit (INDEPENDENT_AMBULATORY_CARE_PROVIDER_SITE_OTHER): Payer: 59 | Admitting: Family Medicine

## 2015-12-19 ENCOUNTER — Encounter: Payer: Self-pay | Admitting: Family Medicine

## 2015-12-19 VITALS — BP 126/82 | HR 65 | Temp 97.4°F | Ht 64.0 in | Wt 305.2 lb

## 2015-12-19 DIAGNOSIS — Z0000 Encounter for general adult medical examination without abnormal findings: Secondary | ICD-10-CM | POA: Diagnosis not present

## 2015-12-19 DIAGNOSIS — E119 Type 2 diabetes mellitus without complications: Secondary | ICD-10-CM

## 2015-12-19 DIAGNOSIS — K219 Gastro-esophageal reflux disease without esophagitis: Secondary | ICD-10-CM | POA: Diagnosis not present

## 2015-12-19 DIAGNOSIS — F418 Other specified anxiety disorders: Secondary | ICD-10-CM

## 2015-12-19 MED ORDER — ESCITALOPRAM OXALATE 10 MG PO TABS: 10.0000 mg | ORAL_TABLET | Freq: Every day | ORAL | Status: DC

## 2015-12-19 MED ORDER — RANITIDINE HCL 300 MG PO TABS: 300.0000 mg | ORAL_TABLET | Freq: Every day | ORAL | Status: DC

## 2015-12-19 MED FILL — ESCITALOPRAM 10 MG TABLET: 10 | 30 days supply | Qty: 30 | Fill #0

## 2015-12-19 MED FILL — raNITIdine HCL 300 MG TABS: 300 | 30 days supply | Qty: 30 | Fill #0

## 2015-12-19 NOTE — Progress Notes (Signed)
Pre visit review using our clinic review tool, if applicable. No additional management support is needed unless otherwise documented below in the visit note. 

## 2015-12-19 NOTE — Progress Notes (Signed)
   Subjective:    Patient ID: Vanessa Reeves, female    DOB: 01-16-1971, 45 y.o.   MRN: [id number]  HPI CPE- no need for pap due to hysterectomy, UTD on on mammo, flu shot.  R shoulder pain- pt reports shoulder is still hurting after injury at work.   Review of Systems Patient reports no vision/ hearing changes, adenopathy,fever, weight change,  persistant/recurrent hoarseness , swallowing issues, chest pain, palpitations, edema, persistant/recurrent cough, hemoptysis, dyspnea (rest/exertional/paroxysmal nocturnal), gastrointestinal bleeding (melena, rectal bleeding), abdominal pain, bowel changes, GU symptoms (dysuria, hematuria, incontinence), Gyn symptoms (abnormal  bleeding, pain),  syncope, focal weakness, memory loss, numbness & tingling, skin/hair/nail changes, abnormal bruising or bleeding, or depression.   + heart burn + anxiety    Objective:   Physical Exam General Appearance:    Alert, cooperative, no distress, appears stated age, obese  Head:    Normocephalic, without obvious abnormality, atraumatic  Eyes:    PERRL, conjunctiva/corneas clear, EOM's intact, fundi    benign, both eyes  Ears:    Normal TM's and external ear canals, both ears  Nose:   Nares normal, septum midline, mucosa normal, no drainage    or sinus tenderness  Throat:   Lips, mucosa, and tongue normal; teeth and gums normal  Neck:   Supple, symmetrical, trachea midline, no adenopathy;    Thyroid: no enlargement/tenderness/nodules  Back:     Symmetric, no curvature, ROM normal, no CVA tenderness  Lungs:     Clear to auscultation bilaterally, respirations unlabored  Chest Wall:    No tenderness or deformity   Heart:    Regular rate and rhythm, S1 and S2 normal, no murmur, rub   or gallop  Breast Exam:    Deferred to mammo  Abdomen:     Soft, non-tender, bowel sounds active all four quadrants,    no masses, no organomegaly  Genitalia:    Deferred  Rectal:    Extremities:   Extremities normal,  atraumatic, no cyanosis or edema  Pulses:   2+ and symmetric all extremities  Skin:   Skin color, texture, turgor normal, no rashes or lesions  Lymph nodes:   Cervical, supraclavicular, and axillary nodes normal  Neurologic:   CNII-XII intact, normal strength, sensation and reflexes    throughout          Assessment & Plan:

## 2015-12-19 NOTE — Patient Instructions (Signed)
Follow up in 4-6 weeks to recheck mood and diabetes Start the Lexapro daily for anxiety Start the Ranitidine nightly to decrease the reflux Continue to work on healthy diet and regular exercise- you can do it!!! Call with any questions or concerns If you want to join [place] at the new [place] office, any scheduled appointments will automatically transfer and we will see you at [address] [place] [address] [place], [place], [place] [address] ([place]) Have a great weekend

## 2015-12-20 DIAGNOSIS — K219 Gastro-esophageal reflux disease without esophagitis: Secondary | ICD-10-CM | POA: Insufficient documentation

## 2015-12-20 NOTE — Assessment & Plan Note (Signed)
Pt's PE WNL w/ exception of obesity.  UTD on mammo, no need for pap due to hysterectomy.  Check labs.  Stressed need for healthy diet and regular exercise.  Anticipatory guidance provided.

## 2015-12-20 NOTE — Assessment & Plan Note (Signed)
Deteriorated.  Pt is again having heightened anxiety.  Based on this, will start Lexapro as pt finds herself needing alprazolam almost daily.  Will follow closely.

## 2015-12-20 NOTE — Assessment & Plan Note (Signed)
Pt is having sxs almost daily and worse at night.  Minimal improvement w/ Tums.  Reviewed dietary and lifestyle modifications.  Start Zantac 300mg  nightly and monitor for improvement.

## 2015-12-20 NOTE — Assessment & Plan Note (Signed)
Check A1C.  Adjust meds prn. 

## 2015-12-26 ENCOUNTER — Encounter: Payer: Self-pay | Admitting: Family Medicine

## 2015-12-31 MED FILL — DESONIDE 0.05% CREAM: 0.05 | 20 days supply | Qty: 60 | Fill #3

## 2016-01-02 ENCOUNTER — Other Ambulatory Visit: Payer: Self-pay | Admitting: Family Medicine

## 2016-01-02 MED FILL — MELOXICAM 15 MG TABLET: 15 | 30 days supply | Qty: 30 | Fill #1

## 2016-01-02 MED FILL — ALPRAZolam 0.5 MG TABS: 0.5 | 30 days supply | Qty: 60 | Fill #1

## 2016-01-02 MED FILL — KLOR-CON M20 TABLET: 20 | 30 days supply | Qty: 60 | Fill #1

## 2016-01-02 MED FILL — ATENOLOL 50 MG TABLET: 50 | 90 days supply | Qty: 180 | Fill #2

## 2016-01-02 MED FILL — METFORMIN HCL ER 500 MG TAB: 500 | 30 days supply | Qty: 30 | Fill #2

## 2016-01-02 MED FILL — SSS 10-5 FOAM: 10-5 | 30 days supply | Qty: 100 | Fill #0

## 2016-01-02 NOTE — Telephone Encounter (Signed)
Medication filled to pharmacy as requested.   

## 2016-01-16 ENCOUNTER — Ambulatory Visit (INDEPENDENT_AMBULATORY_CARE_PROVIDER_SITE_OTHER): Payer: 59 | Admitting: Family Medicine

## 2016-01-16 ENCOUNTER — Encounter: Payer: Self-pay | Admitting: Family Medicine

## 2016-01-16 VITALS — BP 136/86 | HR 89 | Temp 97.8°F | Resp 17 | Wt 304.1 lb

## 2016-01-16 DIAGNOSIS — J0100 Acute maxillary sinusitis, unspecified: Secondary | ICD-10-CM | POA: Diagnosis not present

## 2016-01-16 MED ORDER — AZITHROMYCIN 250 MG PO TABS: ORAL_TABLET | ORAL | Status: DC

## 2016-01-16 MED FILL — AZITHROMYCIN 250 MG TABLET: 250 | 5 days supply | Qty: 6 | Fill #0

## 2016-01-16 NOTE — Progress Notes (Signed)
   Subjective:    Patient ID: Vanessa Reeves, female    DOB: Oct 19, 1971, 45 y.o.   MRN: [id number]  HPI URI- sxs started 2 days ago w/ cough and sneezing.  + nasal congestion, clear drainage.  Cough is productive of yellow sputum.  + SOB.  + fatigue.  Sore throat.  Subjective wheezing.  + HA.  Sinus pressure.  Itchy, watery eyes.  + diarrhea- typical of URIs for her (watery 3x/day).  Yesterday decreased appetite but good fluid intake.  No CP, palpitations, abd pain/cramping, dizziness.  Taking Nyquil/Dayquil w/ some relief.  On Zyrtec and Flonase.  + sick contacts.   Review of Systems For ROS see HPI     Objective:   Physical Exam  Constitutional: She appears well-developed and well-nourished. No distress.  HENT:  Head: Normocephalic and atraumatic.  Right Ear: Tympanic membrane normal.  Left Ear: Tympanic membrane normal.  Nose: Mucosal edema and rhinorrhea present. Right sinus exhibits maxillary sinus tenderness (mild). Right sinus exhibits no frontal sinus tenderness. Left sinus exhibits maxillary sinus tenderness (mild). Left sinus exhibits no frontal sinus tenderness.  Mouth/Throat: Mucous membranes are normal. Posterior oropharyngeal erythema (w/ PND) present.  Eyes: Conjunctivae and EOM are normal. Pupils are equal, round, and reactive to light.  Neck: Normal range of motion. Neck supple.  Cardiovascular: Normal rate, regular rhythm and normal heart sounds.   Pulmonary/Chest: Effort normal and breath sounds normal. No respiratory distress. She has no wheezes. She has no rales.  Lymphadenopathy:    She has no cervical adenopathy.  Vitals reviewed.         Assessment & Plan:

## 2016-01-16 NOTE — Progress Notes (Signed)
Pre visit review using our clinic review tool, if applicable. No additional management support is needed unless otherwise documented below in the visit note. 

## 2016-01-16 NOTE — Patient Instructions (Signed)
Follow up as needed or as scheduled Start the Zpack as directed Drink plenty of fluids Continue the Flonase and Zyrtec daily Continue Dayquil/Nyquil as needed Mucinex DM to thin your congestion and help w/ cough Start a daily OTC probiotic to help slow diarrhea REST! Call with any questions or concerns If you want to join [place] at the new [place] office, any scheduled appointments will automatically transfer and we will see you at [address] [place] [address], [place] [address] ([place] 3/23) [place] in there!!!

## 2016-01-16 NOTE — Assessment & Plan Note (Signed)
New.  Pt's sxs are consistent w/ allergy inflammation and early maxillary sinusitis.  Due to PCN allergy, will start Zyrtec.  Reviewed supportive care and red flags that should prompt return.  Pt expressed understanding and is in agreement w/ plan.

## 2016-01-22 MED FILL — LOSARTAN-HCTZ 100-25 MG TAB: 100-25 | 30 days supply | Qty: 30 | Fill #2

## 2016-01-22 MED FILL — FUROSEMIDE 20 MG TABLET: 20 | 30 days supply | Qty: 30 | Fill #3

## 2016-01-30 ENCOUNTER — Ambulatory Visit: Payer: 59 | Admitting: Family Medicine

## 2016-02-17 ENCOUNTER — Encounter: Payer: Self-pay | Admitting: Family Medicine

## 2016-02-18 ENCOUNTER — Telehealth: Payer: Self-pay | Admitting: General Practice

## 2016-02-18 DIAGNOSIS — Z7689 Persons encountering health services in other specified circumstances: Secondary | ICD-10-CM

## 2016-02-18 NOTE — Telephone Encounter (Signed)
FMLA forms received by fax this morning. Completed and signed by PCP. Paperwork faxed  Back to [employer] today.

## 2016-02-24 ENCOUNTER — Other Ambulatory Visit: Payer: Self-pay | Admitting: Family Medicine

## 2016-02-24 MED FILL — LOSARTAN-HCTZ 100-25 MG TAB: 100-25 | 30 days supply | Qty: 30 | Fill #3

## 2016-02-24 MED FILL — KLOR-CON M20 TABLET: 20 | 30 days supply | Qty: 60 | Fill #2

## 2016-02-24 MED FILL — SSS 10-5 FOAM: 10-5 | 30 days supply | Qty: 100 | Fill #1

## 2016-02-24 MED FILL — FUROSEMIDE 20 MG TABLET: 20 | 30 days supply | Qty: 30 | Fill #1

## 2016-02-24 MED FILL — METFORMIN HCL ER 500 MG TAB: 500 | 30 days supply | Qty: 30 | Fill #3

## 2016-02-24 MED FILL — ALPRAZolam 0.5 MG TABS: 0.5 | 30 days supply | Qty: 60 | Fill #2

## 2016-02-24 NOTE — Telephone Encounter (Signed)
Medication filled to pharmacy as requested.   

## 2016-02-25 ENCOUNTER — Ambulatory Visit (INDEPENDENT_AMBULATORY_CARE_PROVIDER_SITE_OTHER): Payer: 59 | Admitting: Family Medicine

## 2016-02-25 ENCOUNTER — Encounter: Payer: Self-pay | Admitting: Family Medicine

## 2016-02-25 VITALS — BP 122/82 | HR 78 | Temp 98.2°F | Resp 17 | Ht 64.0 in | Wt 306.2 lb

## 2016-02-25 DIAGNOSIS — R05 Cough: Secondary | ICD-10-CM

## 2016-02-25 DIAGNOSIS — R059 Cough, unspecified: Secondary | ICD-10-CM | POA: Insufficient documentation

## 2016-02-25 MED ORDER — PREDNISONE 10 MG PO TABS: ORAL_TABLET | ORAL | Status: DC

## 2016-02-25 MED ORDER — ALBUTEROL SULFATE HFA 108 (90 BASE) MCG/ACT IN AERS: 2.0000 | INHALATION_SPRAY | Freq: Four times a day (QID) | RESPIRATORY_TRACT | Status: DC | PRN

## 2016-02-25 MED ORDER — OMEPRAZOLE 40 MG PO CPDR: 40.0000 mg | DELAYED_RELEASE_CAPSULE | Freq: Every day | ORAL | Status: DC

## 2016-02-25 MED ORDER — ALBUTEROL SULFATE (2.5 MG/3ML) 0.083% IN NEBU
2.5000 mg | INHALATION_SOLUTION | Freq: Once | RESPIRATORY_TRACT | Status: AC
  Administered 2016-02-25: 2.5 mg via RESPIRATORY_TRACT

## 2016-02-25 MED ORDER — IPRATROPIUM-ALBUTEROL 0.5-2.5 (3) MG/3ML IN SOLN
3.0000 mL | Freq: Once | RESPIRATORY_TRACT | Status: AC
  Administered 2016-02-25: 3 mL via RESPIRATORY_TRACT

## 2016-02-25 MED FILL — VENTOLIN HFA 90 MCG INHALER: 108 (90 BAS | 25 days supply | Qty: 18 | Fill #0

## 2016-02-25 MED FILL — OMEPRAZOLE DR 40 MG CAPSULE: 40 | 30 days supply | Qty: 30 | Fill #0

## 2016-02-25 MED FILL — predniSONE 10 MG TABS: 10 | 6 days supply | Qty: 14 | Fill #0

## 2016-02-25 NOTE — Progress Notes (Signed)
Pre visit review using our clinic review tool, if applicable. No additional management support is needed unless otherwise documented below in the visit note. 

## 2016-02-25 NOTE — Patient Instructions (Signed)
Follow up as needed/scheduled Use the Albuterol- 2 puffs every 4 hrs as needed- for cough, wheezing, shortness of breath Take the Prednisone as directed- take w/ food STOP the Ranitidine nightly and START the Omeprazole to help w/ reflux Call with any questions or concerns Hang in there!!!

## 2016-02-25 NOTE — Progress Notes (Signed)
   Subjective:    Patient ID: Vanessa Reeves, female    DOB: [birth year], 45 y.o.   MRN: [id number]  HPI Cough- pt reports she is 'coughing really bad at night'.  Is waking up w/ SOB due to coughing, post-tussive emesis.  Rare cough during the day.  Pt reports intermittent wheezing.  Taking Ranitidine nightly which has improved GERD sxs.  Denies PND at night- using Flonase and daily allergy medication.  No fevers.  No known sick contacts.  Reports feeling well w/ exception of cough and SOB.   Review of Systems For ROS see HPI     Objective:   Physical Exam  Constitutional: She is oriented to person, place, and time. She appears well-developed and well-nourished. No distress.  HENT:  Head: Normocephalic and atraumatic.  Right Ear: Tympanic membrane normal.  Left Ear: Tympanic membrane normal.  Nose: Mucosal edema and rhinorrhea present. Right sinus exhibits no maxillary sinus tenderness and no frontal sinus tenderness. Left sinus exhibits no maxillary sinus tenderness and no frontal sinus tenderness.  Mouth/Throat: Mucous membranes are normal. Posterior oropharyngeal erythema (w/ PND) present.  Eyes: Conjunctivae and EOM are normal. Pupils are equal, round, and reactive to light.  Neck: Normal range of motion. Neck supple.  Cardiovascular: Normal rate, regular rhythm and normal heart sounds.   Pulmonary/Chest: Effort normal. No respiratory distress. She has no wheezes. She has no rales.  Decreased air movement throughout but no audible wheezing.  No improvement in air movement after albuterol neb.  Air movement improved s/p duoneb. + hacking cough  Lymphadenopathy:    She has no cervical adenopathy.  Neurological: She is alert and oriented to person, place, and time.  Skin: Skin is warm and dry.  Psychiatric: She has a normal mood and affect. Her behavior is normal. Thought content normal.  Vitals reviewed.         Assessment & Plan:

## 2016-02-25 NOTE — Assessment & Plan Note (Signed)
New.  Pt's cough is consistent w/ RAD/bronchospasm.  Likely worsened by GERD and PND at night.  No audible wheezing on exam today but pt complains of SOB.  Will start short course of prednisone to improve airway inflammation- cautioned pt that this may elevate her sugar.  Albuterol prn.  Switch H2 blocker to PPI.  Reviewed supportive care and red flags that should prompt return.  Pt expressed understanding and is in agreement w/ plan.

## 2016-03-25 MED FILL — FUROSEMIDE 20 MG TABLET: 20 | 30 days supply | Qty: 30 | Fill #0

## 2016-03-25 MED FILL — LOSARTAN-HCTZ 100-25 MG TAB: 100-25 | 30 days supply | Qty: 30 | Fill #4

## 2016-03-25 MED FILL — METFORMIN HCL ER 500 MG TAB: 500 | 30 days supply | Qty: 30 | Fill #4

## 2016-03-25 MED FILL — ALPRAZolam 0.5 MG TABS: 0.5 | 30 days supply | Qty: 60 | Fill #3

## 2016-03-26 ENCOUNTER — Other Ambulatory Visit: Payer: Self-pay | Admitting: Family Medicine

## 2016-03-26 NOTE — Telephone Encounter (Signed)
Medication filled to pharmacy as requested.   

## 2016-04-06 ENCOUNTER — Encounter: Payer: Self-pay | Admitting: Family Medicine

## 2016-04-06 ENCOUNTER — Ambulatory Visit (INDEPENDENT_AMBULATORY_CARE_PROVIDER_SITE_OTHER): Payer: 59 | Admitting: Family Medicine

## 2016-04-06 VITALS — BP 136/88 | HR 98 | Temp 98.8°F | Resp 17 | Ht 64.0 in | Wt 298.5 lb

## 2016-04-06 DIAGNOSIS — E119 Type 2 diabetes mellitus without complications: Secondary | ICD-10-CM

## 2016-04-06 DIAGNOSIS — I10 Essential (primary) hypertension: Secondary | ICD-10-CM

## 2016-04-06 DIAGNOSIS — M25562 Pain in left knee: Secondary | ICD-10-CM | POA: Diagnosis not present

## 2016-04-06 DIAGNOSIS — R059 Cough, unspecified: Secondary | ICD-10-CM

## 2016-04-06 DIAGNOSIS — R05 Cough: Secondary | ICD-10-CM

## 2016-04-06 DIAGNOSIS — M79661 Pain in right lower leg: Secondary | ICD-10-CM | POA: Insufficient documentation

## 2016-04-06 LAB — CBC WITH DIFFERENTIAL/PLATELET
Basophils Absolute: 0 10*3/uL (ref 0.0–0.1)
Basophils Relative: 0.3 % (ref 0.0–3.0)
Eosinophils Absolute: 0.1 10*3/uL (ref 0.0–0.7)
Eosinophils Relative: 1.2 % (ref 0.0–5.0)
HCT: 36.2 % (ref 36.0–46.0)
Hemoglobin: 11.9 g/dL — ABNORMAL LOW (ref 12.0–15.0)
Lymphocytes Relative: 42.8 % (ref 12.0–46.0)
Lymphs Abs: 3.6 10*3/uL (ref 0.7–4.0)
MCHC: 32.9 g/dL (ref 30.0–36.0)
MCV: 84.3 fl (ref 78.0–100.0)
Monocytes Absolute: 0.6 10*3/uL (ref 0.1–1.0)
Monocytes Relative: 7.1 % (ref 3.0–12.0)
Neutro Abs: 4.1 10*3/uL (ref 1.4–7.7)
Neutrophils Relative %: 48.6 % (ref 43.0–77.0)
Platelets: 424 10*3/uL — ABNORMAL HIGH (ref 150.0–400.0)
RBC: 4.29 Mil/uL (ref 3.87–5.11)
RDW: 15.2 % (ref 11.5–15.5)
WBC: 8.5 10*3/uL (ref 4.0–10.5)

## 2016-04-06 LAB — BASIC METABOLIC PANEL
BUN: 15 mg/dL (ref 6–23)
CALCIUM: 9.8 mg/dL (ref 8.4–10.5)
CO2: 31 meq/L (ref 19–32)
Chloride: 100 mEq/L (ref 96–112)
Creatinine, Ser: 0.92 mg/dL (ref 0.40–1.20)
GFR: 85.5 mL/min (ref >60.00)
GLUCOSE: 113 mg/dL — AB (ref 70–99)
POTASSIUM: 3.7 meq/L (ref 3.5–5.1)
Sodium: 140 mEq/L (ref 135–145)

## 2016-04-06 LAB — LIPID PANEL
Cholesterol: 152 mg/dL (ref 0–200)
HDL: 48.6 mg/dL
LDL Cholesterol: 92 mg/dL (ref 0–99)
NonHDL: 103.69
Total CHOL/HDL Ratio: 3
Triglycerides: 59 mg/dL (ref 0.0–149.0)
VLDL: 11.8 mg/dL (ref 0.0–40.0)

## 2016-04-06 LAB — HEPATIC FUNCTION PANEL
ALT: 13 U/L (ref 0–35)
AST: 12 U/L (ref 0–37)
Albumin: 4 g/dL (ref 3.5–5.2)
Alkaline Phosphatase: 83 U/L (ref 39–117)
Bilirubin, Direct: 0.1 mg/dL (ref 0.0–0.3)
Total Bilirubin: 0.3 mg/dL (ref 0.2–1.2)
Total Protein: 7.7 g/dL (ref 6.0–8.3)

## 2016-04-06 LAB — TSH: TSH: 1.47 u[IU]/mL (ref 0.35–4.50)

## 2016-04-06 LAB — HEMOGLOBIN A1C: Hgb A1c MFr Bld: 6.4 % (ref 4.6–6.5)

## 2016-04-06 MED ORDER — BECLOMETHASONE DIPROPIONATE 80 MCG/ACT IN AERS: 1.0000 | INHALATION_SPRAY | Freq: Two times a day (BID) | RESPIRATORY_TRACT | Status: DC

## 2016-04-06 MED ORDER — IPRATROPIUM-ALBUTEROL 0.5-2.5 (3) MG/3ML IN SOLN
3.0000 mL | Freq: Once | RESPIRATORY_TRACT | Status: AC
  Administered 2016-04-06: 3 mL via RESPIRATORY_TRACT

## 2016-04-06 NOTE — Assessment & Plan Note (Signed)
New to provider but ongoing for pt.  She has hx of knee pain since work injury a few years ago.  She has never been able to return to baseline.  Refer to ortho for complete evaluation and tx.  Pt expressed understanding and is in agreement w/ plan.

## 2016-04-06 NOTE — Progress Notes (Signed)
Pre visit review using our clinic review tool, if applicable. No additional management support is needed unless otherwise documented below in the visit note. 

## 2016-04-06 NOTE — Assessment & Plan Note (Signed)
Chronic problem.  Adequate control.  Asymptomatic.  Check labs.  No anticipated med changes 

## 2016-04-06 NOTE — Assessment & Plan Note (Signed)
Chronic problem.  Pt is asymptomatic.  UTD on eye exam, foot exam, on ARB.  Stressed need for healthy diet and regular exercise.  Check labs.  Adjust meds prn

## 2016-04-06 NOTE — Assessment & Plan Note (Signed)
Ongoing issue for pt.  Suspect that this is cough variant asthma as pt is taking her allergy medication and still requiring albuterol inhaler to get relief.  Increased use of albuterol inhaler and worsening sxs indicates she needs daily controller inhaler- will start Qvar and monitor for improvement.  Pt expressed understanding and is in agreement w/ plan.

## 2016-04-06 NOTE — Patient Instructions (Signed)
Follow up in 3-4 months to recheck sugar We'll notify you of your lab results and make any changes if needed Continue to work on healthy diet and regular exercise- you can do it!! We'll call you with your Ortho appt for the knee pain Start the Qvar inhaler- 1 puff twice daily every day! Use the Albuterol as needed for chest tightness and wheezing Call with any questions or concerns Hang in there!!!

## 2016-04-06 NOTE — Progress Notes (Signed)
   Subjective:    Patient ID: Vanessa Reeves, female    DOB: 03/07/1971, 45 y.o.   MRN: [id number]  HPI Cough- ongoing since last visit in April.  Pt reports she continues to have wheezing, both at night and during the day.  Cough is not productive.  Using Albuterol, Zyrtec, and Flonase.  + SOB- mostly w/ exertion but sometimes at rest.   Pt is using albuterol 'every other day'.  HTN- chronic problem, on Losartan HCTZ, Atenolol, Lasix.  BP control is adequate but not ideal.  Denies CP, HAs, visual changes, edema.  DM- chronic problem, on Metformin.  On ARB for renal protection.  UTD on foot exam, eye exam.  Denies symptomatic lows.  Not exercising due to knee pain.  L knee pain- chronic problem, recently worsening.  Difficult to walk for any distance.  Was injured at work and has not been able to get back to baseline.  + crepitus w/ flexion.  Review of Systems For ROS see HPI     Objective:   Physical Exam  Constitutional: She is oriented to person, place, and time. She appears well-developed and well-nourished. No distress.  Morbidly obese  HENT:  Head: Normocephalic and atraumatic.  TMs normal bilaterally Mild nasal congestion Throat w/out erythema, edema, or exudate  Eyes: Conjunctivae and EOM are normal. Pupils are equal, round, and reactive to light.  Neck: Normal range of motion. Neck supple.  Cardiovascular: Normal rate, regular rhythm, normal heart sounds and intact distal pulses.   No murmur heard. Pulmonary/Chest: Effort normal and breath sounds normal. No respiratory distress. She has no wheezes.  + hacking cough- improved s/p neb tx  Musculoskeletal: She exhibits tenderness (TTP over L knee, + crepitus w/ flexion/extension).  Lymphadenopathy:    She has no cervical adenopathy.  Neurological: She is alert and oriented to person, place, and time.  Skin: Skin is warm and dry.  Psychiatric: She has a normal mood and affect. Her behavior is normal. Thought content normal.   Vitals reviewed.         Assessment & Plan:

## 2016-04-28 ENCOUNTER — Other Ambulatory Visit: Payer: Self-pay | Admitting: Family Medicine

## 2016-04-28 MED FILL — LOSARTAN-HCTZ 100-25 MG TAB: 100-25 | 30 days supply | Qty: 30 | Fill #5

## 2016-04-28 MED FILL — MELOXICAM 15 MG TABLET: 15 | 30 days supply | Qty: 30 | Fill #0

## 2016-04-28 MED FILL — ATENOLOL 50 MG TABLET: 50 | 90 days supply | Qty: 180 | Fill #0

## 2016-04-28 MED FILL — FUROSEMIDE 20 MG TABLET: 20 | 90 days supply | Qty: 90 | Fill #1

## 2016-04-29 ENCOUNTER — Other Ambulatory Visit: Payer: Self-pay | Admitting: Family Medicine

## 2016-04-29 MED FILL — ALPRAZolam 0.5 MG TABS: 0.5 | 30 days supply | Qty: 60 | Fill #0

## 2016-04-29 NOTE — Telephone Encounter (Signed)
Medication filled to pharmacy as requested.   

## 2016-04-29 NOTE — Telephone Encounter (Signed)
Last OV 04/06/16 Alprazolam last filled 12/06/15 #60 with 3

## 2016-05-08 MED FILL — SSS 10-5 FOAM: 10-5 | 30 days supply | Qty: 100 | Fill #0

## 2016-05-23 ENCOUNTER — Encounter: Payer: Self-pay | Admitting: Family Medicine

## 2016-05-25 MED ORDER — FLUCONAZOLE 150 MG PO TABS: 150.0000 mg | ORAL_TABLET | Freq: Once | ORAL | 0 refills | Status: AC

## 2016-05-25 MED FILL — FLUCONAZOLE 150 MG TABLET: 150 | 1 days supply | Qty: 1 | Fill #0

## 2016-06-04 MED FILL — KLOR-CON M20 TABLET: 20 | 30 days supply | Qty: 60 | Fill #3

## 2016-06-04 MED FILL — ALL DAY ALLERGY 10 MG TAB: 10 | 100 days supply | Qty: 100 | Fill #1

## 2016-06-04 MED FILL — METFORMIN HCL ER 500 MG TAB: 500 | 30 days supply | Qty: 30 | Fill #0

## 2016-06-04 MED FILL — raNITIdine HCL 300 MG TABS: 300 | 30 days supply | Qty: 30 | Fill #1

## 2016-06-12 ENCOUNTER — Encounter: Payer: Self-pay | Admitting: Family Medicine

## 2016-06-12 ENCOUNTER — Other Ambulatory Visit: Payer: Self-pay | Admitting: Family Medicine

## 2016-06-12 MED ORDER — LOSARTAN POTASSIUM-HCTZ 100-25 MG PO TABS: 1.0000 | ORAL_TABLET | Freq: Every day | ORAL | 5 refills | Status: DC

## 2016-06-12 MED FILL — LOSARTAN-HCTZ 100-25 MG TAB: 100-25 | 30 days supply | Qty: 30 | Fill #0

## 2016-06-15 ENCOUNTER — Emergency Department (HOSPITAL_BASED_OUTPATIENT_CLINIC_OR_DEPARTMENT_OTHER)
Admission: EM | Admit: 2016-06-15 | Discharge: 2016-06-15 | Disposition: A | Discharge status: Home / Self Care | Payer: 59 | Attending: Emergency Medicine | Admitting: Emergency Medicine

## 2016-06-15 ENCOUNTER — Emergency Department (HOSPITAL_BASED_OUTPATIENT_CLINIC_OR_DEPARTMENT_OTHER): Payer: 59

## 2016-06-15 ENCOUNTER — Encounter (HOSPITAL_BASED_OUTPATIENT_CLINIC_OR_DEPARTMENT_OTHER): Payer: Self-pay | Admitting: *Deleted

## 2016-06-15 DIAGNOSIS — Z79899 Other long term (current) drug therapy: Secondary | ICD-10-CM | POA: Diagnosis not present

## 2016-06-15 DIAGNOSIS — R062 Wheezing: Secondary | ICD-10-CM | POA: Insufficient documentation

## 2016-06-15 DIAGNOSIS — E119 Type 2 diabetes mellitus without complications: Secondary | ICD-10-CM | POA: Insufficient documentation

## 2016-06-15 DIAGNOSIS — Z87891 Personal history of nicotine dependence: Secondary | ICD-10-CM | POA: Insufficient documentation

## 2016-06-15 DIAGNOSIS — E876 Hypokalemia: Secondary | ICD-10-CM | POA: Diagnosis not present

## 2016-06-15 DIAGNOSIS — Z7984 Long term (current) use of oral hypoglycemic drugs: Secondary | ICD-10-CM | POA: Diagnosis not present

## 2016-06-15 DIAGNOSIS — I10 Essential (primary) hypertension: Secondary | ICD-10-CM | POA: Insufficient documentation

## 2016-06-15 DIAGNOSIS — R05 Cough: Secondary | ICD-10-CM | POA: Insufficient documentation

## 2016-06-15 DIAGNOSIS — R0602 Shortness of breath: Secondary | ICD-10-CM | POA: Insufficient documentation

## 2016-06-15 LAB — CBC WITH DIFFERENTIAL/PLATELET
Basophils Absolute: 0 10*3/uL (ref 0.0–0.1)
Basophils Relative: 0 %
EOS PCT: 3 %
Eosinophils Absolute: 0.2 10*3/uL (ref 0.0–0.7)
HCT: 34.7 % — ABNORMAL LOW (ref 36.0–46.0)
Hemoglobin: 11 g/dL — ABNORMAL LOW (ref 12.0–15.0)
LYMPHS ABS: 2.7 10*3/uL (ref 0.7–4.0)
LYMPHS PCT: 43 %
MCH: 27.8 pg (ref 26.0–34.0)
MCHC: 31.7 g/dL (ref 30.0–36.0)
MCV: 87.8 fL (ref 78.0–100.0)
MONO ABS: 0.5 10*3/uL (ref 0.1–1.0)
Monocytes Relative: 8 %
Neutro Abs: 2.9 10*3/uL (ref 1.7–7.7)
Neutrophils Relative %: 46 %
PLATELETS: 384 10*3/uL (ref 150–400)
RBC: 3.95 MIL/uL (ref 3.87–5.11)
RDW: 14.9 % (ref 11.5–15.5)
WBC: 6.4 10*3/uL (ref 4.0–10.5)

## 2016-06-15 LAB — COMPREHENSIVE METABOLIC PANEL
ALT: 12 U/L — ABNORMAL LOW (ref 14–54)
AST: 14 U/L — ABNORMAL LOW (ref 15–41)
Albumin: 3.4 g/dL — ABNORMAL LOW (ref 3.5–5.0)
Alkaline Phosphatase: 67 U/L (ref 38–126)
Anion gap: 7 (ref 5–15)
BUN: 9 mg/dL (ref 6–20)
CHLORIDE: 101 mmol/L (ref 101–111)
CO2: 29 mmol/L (ref 22–32)
CREATININE: 0.93 mg/dL (ref 0.44–1.00)
Calcium: 8.7 mg/dL — ABNORMAL LOW (ref 8.9–10.3)
Glucose, Bld: 117 mg/dL — ABNORMAL HIGH (ref 65–99)
POTASSIUM: 3.1 mmol/L — AB (ref 3.5–5.1)
Sodium: 137 mmol/L (ref 135–145)
Total Bilirubin: 0.3 mg/dL (ref 0.3–1.2)
Total Protein: 7.5 g/dL (ref 6.5–8.1)

## 2016-06-15 LAB — BRAIN NATRIURETIC PEPTIDE: B NATRIURETIC PEPTIDE 5: 102.4 pg/mL — AB (ref 0.0–100.0)

## 2016-06-15 LAB — TROPONIN I

## 2016-06-15 MED ORDER — IPRATROPIUM-ALBUTEROL 0.5-2.5 (3) MG/3ML IN SOLN
3.0000 mL | Freq: Once | RESPIRATORY_TRACT | Status: AC
  Administered 2016-06-15: 3 mL via RESPIRATORY_TRACT

## 2016-06-15 MED ORDER — ALBUTEROL SULFATE (2.5 MG/3ML) 0.083% IN NEBU
2.5000 mg | INHALATION_SOLUTION | Freq: Once | RESPIRATORY_TRACT | Status: AC
  Administered 2016-06-15: 2.5 mg via RESPIRATORY_TRACT

## 2016-06-15 MED ORDER — ALBUTEROL (5 MG/ML) CONTINUOUS INHALATION SOLN
10.0000 mg/h | INHALATION_SOLUTION | RESPIRATORY_TRACT | Status: DC
  Administered 2016-06-15: 10 mg/h via RESPIRATORY_TRACT
  Filled 2016-06-15: qty 20

## 2016-06-15 MED ORDER — IPRATROPIUM-ALBUTEROL 0.5-2.5 (3) MG/3ML IN SOLN
RESPIRATORY_TRACT | Status: AC
  Filled 2016-06-15: qty 3

## 2016-06-15 MED ORDER — PREDNISONE 20 MG PO TABS: ORAL_TABLET | ORAL | 0 refills | Status: DC

## 2016-06-15 MED ORDER — METHYLPREDNISOLONE SODIUM SUCC 125 MG IJ SOLR
125.0000 mg | Freq: Once | INTRAMUSCULAR | Status: AC
  Administered 2016-06-15: 125 mg via INTRAMUSCULAR
  Filled 2016-06-15: qty 2

## 2016-06-15 MED ORDER — ALBUTEROL SULFATE (2.5 MG/3ML) 0.083% IN NEBU
INHALATION_SOLUTION | RESPIRATORY_TRACT | Status: AC
  Filled 2016-06-15: qty 3

## 2016-06-15 NOTE — ED Triage Notes (Signed)
Wheezing for a few months. States for the past 24 hours she cannot get relief with her inhaler.

## 2016-06-15 NOTE — ED Notes (Signed)
Pt last used albuterol inhaler at 1130.

## 2016-06-15 NOTE — ED Provider Notes (Signed)
[facility] Provider Note   CSN: [id number] Arrival date & time: 06/15/16  1322   By signing my name below, I, Shanna Cisco, attest that this documentation has been prepared under the direction and in the presence of Julianne Rice, MD. Electronically Signed: Shanna Cisco, ED Scribe. 06/15/16. 5:51 PM.   History   Chief Complaint Chief Complaint  Patient presents with  . Shortness of Breath     The history is provided by the patient. No language interpreter was used.   HPI Comments:  Vanessa Reeves is a 45 y.o. female who presents to the Emergency Department complaining of moderate, gradually worsening SOB, which started a few months ago. Pt reports that it has gradually worsened over the past 24 hours. Associated symptoms include dry cough and wheezing. Pt was prescribed inhalers by PCP and was told to use daily. No modifying factors. Denies edema in bilateral legs or fever. Breathing treatmentGiven in triage did not help. Denies any chest pain.    Past Medical History:  Diagnosis Date  . Anxiety   . Diabetes mellitus without complication ([place])   . Hypertension     Patient Active Problem List   Diagnosis Date Noted  . Left anterior knee pain 04/06/2016  . Cough 02/25/2016  . GERD (gastroesophageal reflux disease) 12/20/2015  . Possible exposure to STD 06/07/2015  . Anxiety state 06/07/2015  . Physical exam 12/14/2014  . Maxillary sinusitis, acute 12/14/2014  . Vaginitis and vulvovaginitis 03/30/2014  . Urinary frequency 03/30/2014  . Breast mass, left 02/16/2014  . Edema 02/16/2014  . Chest pain 12/29/2013  . Hypokalemia 08/28/2013  . Diabetes ([place]) 08/28/2013  . Depression with anxiety 07/17/2013  . HTN (hypertension) 07/17/2013  . Eustachian tube dysfunction 07/17/2013  . Severe obesity (BMI >= 40) ([place]) 07/17/2013    Past Surgical History:  Procedure Laterality Date  . ABDOMINAL HYSTERECTOMY    . DILATION AND CURETTAGE OF UTERUS    . TUBAL  LIGATION      OB History    Gravida Para Term Preterm AB Living   _0 SAB TAB Ectopic Multiple Live Births   1 1             Home Medications    Prior to Admission medications   Medication Sig Start Date End Date Taking? Authorizing Provider  acetaminophen (TYLENOL) 325 MG tablet Take 650 mg by mouth every 6 (six) hours as needed.    Historical Provider, MD  albuterol (PROVENTIL HFA;VENTOLIN HFA) 108 (90 Base) MCG/ACT inhaler Inhale 2 puffs into the lungs every 6 (six) hours as needed for wheezing or shortness of breath. 02/25/16   Midge Minium, MD  ALPRAZolam Duanne Moron) 0.5 MG tablet TAKE 1 TABLET BY MOUTH TWICE A DAY AS NEEDED 04/29/16   Midge Minium, MD  atenolol (TENORMIN) 50 MG tablet TAKE 2 TABLETS BY MOUTH AT BEDTIME. 02/24/16   Midge Minium, MD  atomoxetine (STRATTERA) 25 MG capsule Take 25 mg by mouth daily. Reported on 12/19/2015    Historical Provider, MD  beclomethasone (QVAR) 80 MCG/ACT inhaler Inhale 1 puff into the lungs 2 (two) times daily. 04/06/16   Midge Minium, MD  Blood Glucose Monitoring Suppl (CONTOUR NEXT EZ MONITOR) W/DEVICE KIT Please use monitor to test sugar levels once daily. Dx. E11.9 01/16/15   Midge Minium, MD  cetirizine (ZYRTEC) 10 MG tablet Take 1 tablet (10 mg total) by mouth daily. 08/19/15  Katherine E Tabori, MD  desonide (DESOWEN) 0.05 % cream Apply topically 2 (two) times daily. 06/07/15   Katherine E Tabori, MD  escitalopram (LEXAPRO) 10 MG tablet Take 1 tablet (10 mg total) by mouth daily. 12/19/15   Katherine E Tabori, MD  fluticasone (FLONASE) 50 MCG/ACT nasal spray Place 2 sprays into both nostrils daily. 06/07/15   Katherine E Tabori, MD  furosemide (LASIX) 20 MG tablet TAKE 1 TABLET BY MOUTH ONCE DAILY 02/24/16   Katherine E Tabori, MD  gabapentin (NEURONTIN) 300 MG capsule Take 1 tablet at bedtime for one week, then increase to 1 tablet twice daily. 01/15/15   Donika K Patel, DO  glucose blood (BAYER CONTOUR TEST)  test strip Please use one strip to test glucose levels daily. Dx. E11.9 01/16/15   Katherine E Tabori, MD  HYDROcodone-acetaminophen (NORCO) 10-325 MG tablet  07/30/15   Historical Provider, MD  ibuprofen (ADVIL,MOTRIN) 600 MG tablet Take 1 tablet (600 mg total) by mouth every 8 (eight) hours as needed. 06/13/15   John Wofford, MD  losartan-hydrochlorothiazide (HYZAAR) 100-25 MG tablet Take 1 tablet by mouth daily. 06/12/16   Katherine E Tabori, MD  meloxicam (MOBIC) 15 MG tablet TAKE 1 TABLET BY MOUTH ONCE DAILY 03/26/16   Katherine E Tabori, MD  metFORMIN (GLUCOPHAGE-XR) 500 MG 24 hr tablet TAKE 1 TABLET BY MOUTH ONCE DAILY WITH BREAKFAST 04/29/16   Katherine E Tabori, MD  omeprazole (PRILOSEC) 40 MG capsule Take 1 capsule (40 mg total) by mouth daily. 02/25/16   Katherine E Tabori, MD  potassium chloride SA (K-DUR,KLOR-CON) 20 MEQ tablet Take 2 tablets (40 mEq total) by mouth daily. 10/14/15   Katherine E Tabori, MD  predniSONE (DELTASONE) 20 MG tablet 2 tabs po daily x 4 days 06/15/16   Taequan Stockhausen, MD  ranitidine (ZANTAC) 300 MG tablet Take 1 tablet (300 mg total) by mouth at bedtime. 12/19/15   Katherine E Tabori, MD  SSS 10-5 10-5 % FOAM APPLY TO THE AFFECTED AREA ONCE DAILY 04/29/16   Katherine E Tabori, MD  valACYclovir (VALTREX) 1000 MG tablet Take 1 tablet (1,000 mg total) by mouth 2 (two) times daily. 03/07/15   Katherine E Tabori, MD    Family History Family History  Problem Relation Age of Onset  . Hypertension Mother   . Stroke Father   . Hypertension Paternal Grandmother   . Diabetes Mellitus II Paternal Grandmother   . Healthy Brother   . Healthy Daughter   . Healthy Son     Social History Social History  Substance Use Topics  . Smoking status: Former Smoker  . Smokeless tobacco: Never Used  . Alcohol use 0.0 oz/week     Comment: socially     Allergies   Penicillins   Review of Systems Review of Systems  Constitutional: Negative for chills, fatigue and fever.    HENT: Negative for congestion, ear pain and facial swelling.   Respiratory: Positive for cough, shortness of breath and wheezing. Negative for chest tightness.   Cardiovascular: Negative for chest pain, palpitations and leg swelling.  Gastrointestinal: Negative for abdominal pain, constipation, diarrhea, nausea and vomiting.  Musculoskeletal: Negative for back pain and neck pain.  Skin: Negative for rash and wound.  Neurological: Negative for dizziness, weakness, light-headedness, numbness and headaches.  All other systems reviewed and are negative.    Physical Exam Updated Vital Signs BP (!) 165/152   Pulse 90   Temp 98.3 F (36.8 C) (Oral)   Resp 18   Ht   5' 4" (1.626 m)   Wt 298 lb (135.2 kg)   LMP 12/04/2011   SpO2 100%   BMI 51.15 kg/m   Physical Exam  Constitutional: She is oriented to person, place, and time. She appears well-developed and well-nourished.  HENT:  Head: Normocephalic and atraumatic.  Mouth/Throat: Oropharynx is clear and moist.  Eyes: EOM are normal. Pupils are equal, round, and reactive to light.  Neck: Normal range of motion. Neck supple. No JVD present.  Cardiovascular: Normal rate and regular rhythm.   Pulmonary/Chest: Effort normal.  Diminished breath sounds which may be due to body habitus. No definite wheezing appreciated.  Abdominal: Soft. Bowel sounds are normal. There is no tenderness. There is no rebound and no guarding.  Musculoskeletal: Normal range of motion. She exhibits no edema or tenderness.  No lower extremity swelling, asymmetry or tenderness. Distal pulses are equal and 2+.  Neurological: She is alert and oriented to person, place, and time.  Moves all extremities without deficit. Sensation is fully intact.  Skin: Skin is warm and dry. No rash noted. No erythema.  Psychiatric: She has a normal mood and affect. Her behavior is normal.  Nursing note and vitals reviewed.    ED Treatments / Results  DIAGNOSTIC STUDIES:  Oxygen  Saturation is 100% on room air, normal by my interpretation.    COORDINATION OF CARE:  3:17 PM Discussed treatment plan with pt at bedside, which includes blood work and breathing treatment, and pt agreed to plan. Discussed possible evaluation with pulmonologist.   Labs (all labs ordered are listed, but only abnormal results are displayed) Labs Reviewed  CBC WITH DIFFERENTIAL/PLATELET - Abnormal; Notable for the following:       Result Value   Hemoglobin 11.0 (*)    HCT 34.7 (*)    All other components within normal limits  COMPREHENSIVE METABOLIC PANEL - Abnormal; Notable for the following:    Potassium 3.1 (*)    Glucose, Bld 117 (*)    Calcium 8.7 (*)    Albumin 3.4 (*)    AST 14 (*)    ALT 12 (*)    All other components within normal limits  BRAIN NATRIURETIC PEPTIDE - Abnormal; Notable for the following:    B Natriuretic Peptide 102.4 (*)    All other components within normal limits  TROPONIN I    EKG  EKG Interpretation  Date/Time:  Monday June 15 2016 15:41:27 EDT Ventricular Rate:  79 PR Interval:    QRS Duration: 91 QT Interval:  380 QTC Calculation: 436 R Axis:   -13 Text Interpretation:  Sinus rhythm Abnormal R-wave progression, early transition Left ventricular hypertrophy Confirmed by Lita Mains  MD, Jaylenn Altier ([id number]) on 06/15/2016 3:47:34 PM       Radiology Dg Chest 2 View  Result Date: 06/15/2016 CLINICAL DATA:  Two-month history of shortness of breath EXAM: CHEST  2 VIEW COMPARISON:  September 11, 2014 FINDINGS: Lungs are clear. Heart size and pulmonary vascularity are normal. No adenopathy. No pneumothorax. No bone lesions. IMPRESSION: No edema or consolidation. Electronically Signed   By: Lowella Grip III M.D.   On: 06/15/2016 15:39    Procedures Procedures (including critical care time)  Medications Ordered in ED Medications  albuterol (PROVENTIL,VENTOLIN) solution continuous neb (10 mg/hr Nebulization New Bag/Given 06/15/16 1630)  albuterol  (PROVENTIL) (2.5 MG/3ML) 0.083% nebulizer solution 2.5 mg (2.5 mg Nebulization Given 06/15/16 1336)  ipratropium-albuterol (DUONEB) 0.5-2.5 (3) MG/3ML nebulizer solution 3 mL (3 mLs Nebulization Given 06/15/16 1336)  methylPREDNISolone  sodium succinate (SOLU-MEDROL) 125 mg/2 mL injection 125 mg (125 mg Intramuscular Given 06/15/16 1552)     Initial Impression / Assessment and Plan / ED Course  I have reviewed the triage vital signs and the nursing notes.  Pertinent labs & imaging results that were available during my care of the patient were reviewed by me and considered in my medical decision making (see chart for details).  Clinical Course    Patient states her shortness of breath is improved after continuous nebulized treatment. Noted to be mildly erythemic laboratory workup. Chest x-ray without acute findings. Low suspicion for PE. Possibly bronchospasm. Advised to follow-up with her primary physician. Will give short course of steroids. Patient may need to be referred to a pulmonologist.  Final Clinical Impressions(s) / ED Diagnoses   Final diagnoses:  SOB (shortness of breath)  Hypokalemia    New Prescriptions New Prescriptions   PREDNISONE (DELTASONE) 20 MG TABLET    2 tabs po daily x 4 days  I personally performed the services described in this documentation, which was scribed in my presence. The recorded information has been reviewed and is accurate.        Julianne Rice, MD 06/15/16 [phone]

## 2016-06-30 MED FILL — SSS 10-5 FOAM: 10-5 | 30 days supply | Qty: 100 | Fill #1

## 2016-07-08 MED FILL — KLOR-CON M20 TABLET: 20 | 30 days supply | Qty: 60 | Fill #4

## 2016-07-09 ENCOUNTER — Other Ambulatory Visit: Payer: Self-pay | Admitting: Family Medicine

## 2016-07-09 MED ORDER — VALACYCLOVIR HCL 1 G PO TABS: 1000.0000 mg | ORAL_TABLET | Freq: Two times a day (BID) | ORAL | 3 refills | Status: DC

## 2016-07-09 MED FILL — METFORMIN HCL ER 500 MG TAB: 500 | 30 days supply | Qty: 30 | Fill #1

## 2016-07-09 MED FILL — valACYclovir HCL 1 GM TABS: 1 | 10 days supply | Qty: 20 | Fill #0

## 2016-07-21 ENCOUNTER — Other Ambulatory Visit: Payer: Self-pay | Admitting: Family Medicine

## 2016-07-21 MED FILL — LOSARTAN-HCTZ 100-25 MG TAB: 100-25 | 30 days supply | Qty: 30 | Fill #1

## 2016-07-21 MED FILL — FUROSEMIDE 20 MG TABLET: 20 | 30 days supply | Qty: 30 | Fill #0

## 2016-07-27 ENCOUNTER — Other Ambulatory Visit: Payer: Self-pay | Admitting: Family Medicine

## 2016-07-27 MED FILL — FLUTICASONE PROP 50 MCG SPR: 50 | 30 days supply | Qty: 16 | Fill #0

## 2016-07-27 MED FILL — MELOXICAM 15 MG TABLET: 15 | 30 days supply | Qty: 30 | Fill #1

## 2016-08-04 ENCOUNTER — Other Ambulatory Visit: Payer: Self-pay | Admitting: Family Medicine

## 2016-08-04 MED FILL — DESONIDE 0.05% CREAM: 0.05 | 20 days supply | Qty: 60 | Fill #0

## 2016-08-04 MED FILL — SSS 10-5 FOAM: 10-5 | 30 days supply | Qty: 100 | Fill #0

## 2016-08-07 ENCOUNTER — Ambulatory Visit: Payer: 59 | Admitting: Family Medicine

## 2016-08-11 MED FILL — METFORMIN HCL ER 500 MG TAB: 500 | 30 days supply | Qty: 30 | Fill #2

## 2016-08-18 MED FILL — ATENOLOL 50 MG TABLET: 50 | 30 days supply | Qty: 60 | Fill #1

## 2016-08-21 ENCOUNTER — Ambulatory Visit (INDEPENDENT_AMBULATORY_CARE_PROVIDER_SITE_OTHER): Payer: 59 | Admitting: Family Medicine

## 2016-08-21 ENCOUNTER — Encounter: Payer: Self-pay | Admitting: Family Medicine

## 2016-08-21 ENCOUNTER — Encounter: Payer: Self-pay | Admitting: General Practice

## 2016-08-21 VITALS — BP 126/82 | HR 86 | Temp 98.3°F | Resp 16 | Ht 64.0 in | Wt 311.2 lb

## 2016-08-21 DIAGNOSIS — Z23 Encounter for immunization: Secondary | ICD-10-CM | POA: Diagnosis not present

## 2016-08-21 DIAGNOSIS — J4540 Moderate persistent asthma, uncomplicated: Secondary | ICD-10-CM

## 2016-08-21 DIAGNOSIS — F418 Other specified anxiety disorders: Secondary | ICD-10-CM

## 2016-08-21 DIAGNOSIS — F900 Attention-deficit hyperactivity disorder, predominantly inattentive type: Secondary | ICD-10-CM | POA: Diagnosis not present

## 2016-08-21 DIAGNOSIS — E119 Type 2 diabetes mellitus without complications: Secondary | ICD-10-CM

## 2016-08-21 DIAGNOSIS — J45909 Unspecified asthma, uncomplicated: Secondary | ICD-10-CM | POA: Insufficient documentation

## 2016-08-21 LAB — BASIC METABOLIC PANEL
BUN: 16 mg/dL (ref 6–23)
CHLORIDE: 102 meq/L (ref 96–112)
CO2: 27 mEq/L (ref 19–32)
CREATININE: 0.84 mg/dL (ref 0.40–1.20)
Calcium: 9.5 mg/dL (ref 8.4–10.5)
GFR: 94.8 mL/min (ref >60.00)
GLUCOSE: 114 mg/dL — AB (ref 70–99)
POTASSIUM: 4 meq/L (ref 3.5–5.1)
Sodium: 138 mEq/L (ref 135–145)

## 2016-08-21 LAB — TSH: TSH: 2.78 u[IU]/mL (ref 0.35–4.50)

## 2016-08-21 LAB — HEMOGLOBIN A1C: Hgb A1c MFr Bld: 6.5 % (ref 4.6–6.5)

## 2016-08-21 MED ORDER — ATOMOXETINE HCL 40 MG PO CAPS: 40.0000 mg | ORAL_CAPSULE | Freq: Every day | ORAL | 6 refills | Status: DC

## 2016-08-21 MED ORDER — BECLOMETHASONE DIPROPIONATE 80 MCG/ACT IN AERS: 1.0000 | INHALATION_SPRAY | Freq: Two times a day (BID) | RESPIRATORY_TRACT | 12 refills | Status: DC

## 2016-08-21 MED FILL — ATOMOXETINE HCL 40 MG CAP: 40 | 30 days supply | Qty: 30 | Fill #0

## 2016-08-21 MED FILL — ESCITALOPRAM 10 MG TABLET: 10 | 30 days supply | Qty: 30 | Fill #1

## 2016-08-21 MED FILL — LOSARTAN-HCTZ 100-25 MG TAB: 100-25 | 30 days supply | Qty: 30 | Fill #2

## 2016-08-21 MED FILL — QVAR 80 MCG ORAL INHALER: 80 | 30 days supply | Qty: 9 | Fill #0

## 2016-08-21 NOTE — Assessment & Plan Note (Signed)
Anxiety is worsening w/ her attention difficulties and she is having trouble at school and work meeting her responsibilities.  Continue Lexapro.  Add Strattera for attention and see if anxiety improves.  Pt expressed understanding and is in agreement w/ plan.

## 2016-08-21 NOTE — Assessment & Plan Note (Signed)
New.  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  She never started the Strattera as previously prescribed.  Will start 40mg  and monitor for improvement and titrate prn.  Pt expressed understanding and is in agreement w/ plan.

## 2016-08-21 NOTE — Progress Notes (Signed)
   Subjective:    Patient ID: Vanessa Reeves, female    DOB: 06/04/1971, 45 y.o.   MRN: [id number]  HPI DM- chronic problem.  On Metformin.  On ARB for renal protection.  UTD on eye exam, foot exam.  Pt has gained 13 lbs since last visit.  Some swelling of ankles bilaterally.  Pt is not exercising.  Pt has not been eating a low carb diet.  No CP, HAs, visual changes, abd pain, N/V.  Difficulty w/ attention- pt is having a hard time focusing in class, not able to attend to her class work, her homework.  Teacher has told her she appears distracted.  Not currently on Strattera- never started this.    Anxiety- chronic problem, worsening w/ school and her inability to pay attention.  Pt has difficulty interacting w/ new people b/c she gets overwhelmed.  On Lexapro.    Reactive Airway- pt is using Albuterol twice daily.  Pt has found out there is mold in the work place.  Continues to wheeze.     Review of Systems For ROS see HPI     Objective:   Physical Exam  Constitutional: She is oriented to person, place, and time. She appears well-developed and well-nourished. No distress.  obese  HENT:  Head: Normocephalic and atraumatic.  Eyes: Conjunctivae and EOM are normal. Pupils are equal, round, and reactive to light.  Neck: Normal range of motion. Neck supple. No thyromegaly present.  Cardiovascular: Normal rate, regular rhythm, normal heart sounds and intact distal pulses.   No murmur heard. Pulmonary/Chest: Effort normal and breath sounds normal. No respiratory distress.  Abdominal: Soft. She exhibits no distension. There is no tenderness.  Musculoskeletal: She exhibits no edema.  Lymphadenopathy:    She has no cervical adenopathy.  Neurological: She is alert and oriented to person, place, and time.  Skin: Skin is warm and dry.  Psychiatric: She has a normal mood and affect. Her behavior is normal.  Vitals reviewed.         Assessment & Plan:

## 2016-08-21 NOTE — Patient Instructions (Signed)
Follow up in 1 month to recheck anxiety and attention We'll notify you of your lab results and make any changes if needed Please work on low carb diet and attempt to get regular exercise- you can do it! Start the Strattera once daily for attention and anxiety Make sure you are using the Qvar- 1 puff twice daily- every day to control your breathing symptoms and use albuterol only as needed Call with any questions or concerns Hang in there!!!

## 2016-08-21 NOTE — Assessment & Plan Note (Signed)
Ongoing issue for pt.  She admits poor dietary compliance and no exercise.  Stressed the need for both as she has gained 13 lbs since last visit.  UTD on foot exam, eye exam, and on ARB for renal protection.  Check labs.  Adjust meds prn

## 2016-08-21 NOTE — Assessment & Plan Note (Signed)
Deteriorated.  Pt has gained another 13 lbs!  Stressed need for healthy diet and regular exercise.  Will continue to follow.

## 2016-08-21 NOTE — Progress Notes (Signed)
Pre visit review using our clinic review tool, if applicable. No additional management support is needed unless otherwise documented below in the visit note. 

## 2016-08-21 NOTE — Assessment & Plan Note (Signed)
Ongoing issue for pt.  She has mold exposure at work.  She has previously been given Qvar but she is not using this regularly.  Start daily use w/ albuterol only as needed.  Reviewed supportive care and red flags that should prompt return.  Pt expressed understanding and is in agreement w/ plan.

## 2016-08-23 ENCOUNTER — Encounter: Payer: Self-pay | Admitting: Family Medicine

## 2016-08-24 DIAGNOSIS — Z7689 Persons encountering health services in other specified circumstances: Secondary | ICD-10-CM

## 2016-09-10 ENCOUNTER — Encounter: Payer: Self-pay | Admitting: Family Medicine

## 2016-09-18 ENCOUNTER — Ambulatory Visit: Payer: 59 | Admitting: Family Medicine

## 2016-09-18 MED FILL — METFORMIN HCL ER 500 MG TAB: 500 | 30 days supply | Qty: 30 | Fill #3

## 2016-09-18 MED FILL — LOSARTAN-HCTZ 100-25 MG TAB: 100-25 | 30 days supply | Qty: 30 | Fill #3

## 2016-09-18 MED FILL — ESCITALOPRAM 10 MG TABLET: 10 | 30 days supply | Qty: 30 | Fill #2

## 2016-09-18 MED FILL — ATENOLOL 50 MG TABLET: 50 | 30 days supply | Qty: 60 | Fill #2

## 2016-09-18 MED FILL — FLUTICASONE PROP 50 MCG SPR: 50 | 30 days supply | Qty: 16 | Fill #1

## 2016-10-21 ENCOUNTER — Ambulatory Visit: Payer: Self-pay | Admitting: Family Medicine

## 2016-10-22 ENCOUNTER — Other Ambulatory Visit: Payer: Self-pay | Admitting: Family Medicine

## 2016-10-22 MED FILL — MELOXICAM 15 MG TABLET: 15 | 30 days supply | Qty: 30 | Fill #0

## 2016-10-22 MED FILL — METFORMIN HCL ER 500 MG TAB: 500 | 30 days supply | Qty: 30 | Fill #4

## 2016-10-22 MED FILL — LOSARTAN-HCTZ 100-25 MG TAB: 100-25 | 30 days supply | Qty: 30 | Fill #4

## 2016-10-22 MED FILL — ATENOLOL 50 MG TABLET: 50 | 30 days supply | Qty: 60 | Fill #3

## 2016-10-29 MED FILL — SSS 10-5 FOAM: 10-5 | 30 days supply | Qty: 100 | Fill #1

## 2016-10-30 ENCOUNTER — Encounter: Payer: Self-pay | Admitting: Family Medicine

## 2016-10-30 ENCOUNTER — Other Ambulatory Visit (HOSPITAL_COMMUNITY)
Admission: RE | Admit: 2016-10-30 | Discharge: 2016-10-30 | Disposition: A | Discharge status: Home / Self Care | Payer: 59 | Source: Ambulatory Visit | Attending: Family Medicine | Admitting: Family Medicine

## 2016-10-30 ENCOUNTER — Ambulatory Visit (INDEPENDENT_AMBULATORY_CARE_PROVIDER_SITE_OTHER): Payer: 59 | Admitting: Family Medicine

## 2016-10-30 VITALS — BP 124/84 | HR 86 | Resp 16 | Ht 64.0 in | Wt 307.5 lb

## 2016-10-30 DIAGNOSIS — L299 Pruritus, unspecified: Secondary | ICD-10-CM | POA: Insufficient documentation

## 2016-10-30 DIAGNOSIS — F418 Other specified anxiety disorders: Secondary | ICD-10-CM | POA: Diagnosis not present

## 2016-10-30 DIAGNOSIS — N760 Acute vaginitis: Secondary | ICD-10-CM | POA: Diagnosis not present

## 2016-10-30 DIAGNOSIS — M79661 Pain in right lower leg: Secondary | ICD-10-CM

## 2016-10-30 NOTE — Assessment & Plan Note (Signed)
Recurrent issue for pt.  Her biggest complaint is itching- which she equates to yeast.  But given the lack of d/c, I suspect this is more vaginal atrophy than true yeast.  Will do urine cytology to assess.  If negative, will need to consider vaginal estrogen to improve atrophy.  Pt expressed understanding and is in agreement w/ plan.

## 2016-10-30 NOTE — Patient Instructions (Addendum)
Schedule your complete physical for after 12/18/16 We'll call you with your urine results and determine the next steps for the vaginal itching We'll call you with your sports med appt for the leg pain Continue the meloxicam and the tylenol for pain relief I'm so glad that the anxiety is better!  You can do this! Call with any questions or concerns Happy New Year!

## 2016-10-30 NOTE — Assessment & Plan Note (Signed)
Ongoing issue for pt.  She never went to the ortho referral from June.  Will refer to Sports med as she is having lateral lower leg pain as well as anterior knee pain.  Continue Mobic and Tylenol prn.  Pt expressed understanding and is in agreement w/ plan.

## 2016-10-30 NOTE — Progress Notes (Signed)
   Subjective:    Patient ID: Vanessa Reeves, female    DOB: 1971-02-01, 45 y.o.   MRN: [id number]  HPI Leg pain- R leg pain.  Pt reports leg will 'lock up on me' and she will require assistance to get up off the couch.  Pt feels pain starts lower, lateral leg and travels up into knee.  Some improvement w/ meloxicam and tylenol.  Pt stands on her feet all day for work  Anxiety- pt reports sxs are improving.  Currently on Lexapro and Strattera which pt feels is helping.  Has not 'cried this semester'.  Pt reports focus and concentration have improved.  Recurrent yeast- pt reports ongoing itching.  No vaginal discharge.  Last tx'd in July.  Feels she again has sxs.  Using [place].  + vaginal dryness.  Some painful intercourse due to dryness.  sxs worsened after hysterectomy.   Review of Systems For ROS see HPI     Objective:   Physical Exam  Constitutional: She is oriented to person, place, and time. She appears well-developed and well-nourished. No distress.  obese  HENT:  Head: Normocephalic and atraumatic.  Cardiovascular: Intact distal pulses.   Musculoskeletal: She exhibits tenderness (TTP over R lateral lower leg and R anterior knee, pain w/ weight bearing). She exhibits no edema.  Neurological: She is alert and oriented to person, place, and time. No cranial nerve deficit. Coordination normal.  Skin: Skin is warm and dry. No rash noted. No erythema.  Psychiatric: She has a normal mood and affect. Her behavior is normal. Thought content normal.  Vitals reviewed.         Assessment & Plan:

## 2016-10-30 NOTE — Progress Notes (Signed)
Pre visit review using our clinic review tool, if applicable. No additional management support is needed unless otherwise documented below in the visit note. 

## 2016-10-30 NOTE — Assessment & Plan Note (Signed)
Improved since starting SSRI and Strattera.  Pt passed her classes this semester which was a big confidence boost for her.  No med changes at this time.  Will follow.

## 2016-11-03 ENCOUNTER — Other Ambulatory Visit: Payer: Self-pay | Admitting: Family Medicine

## 2016-11-03 DIAGNOSIS — Z1231 Encounter for screening mammogram for malignant neoplasm of breast: Secondary | ICD-10-CM

## 2016-11-04 ENCOUNTER — Encounter: Payer: Self-pay | Admitting: Family Medicine

## 2016-11-05 ENCOUNTER — Encounter: Payer: Self-pay | Admitting: Family Medicine

## 2016-11-05 ENCOUNTER — Ambulatory Visit (INDEPENDENT_AMBULATORY_CARE_PROVIDER_SITE_OTHER): Payer: 59 | Admitting: Family Medicine

## 2016-11-05 DIAGNOSIS — M79661 Pain in right lower leg: Secondary | ICD-10-CM | POA: Diagnosis not present

## 2016-11-05 DIAGNOSIS — M79604 Pain in right leg: Secondary | ICD-10-CM | POA: Diagnosis not present

## 2016-11-05 LAB — URINE CYTOLOGY ANCILLARY ONLY: CANDIDA VAGINITIS: NEGATIVE

## 2016-11-05 NOTE — Patient Instructions (Signed)
The pain in your right leg is muscular - medial gastroc (calf), anterior tibialis (front of shin), and distal quad weakness and spasms. This typically responds to strengthening and compression. Tylenol or meloxicam if needed for pain. All exercises work your way up to doing 3 sets of 10 once a day. Calf raises - start both legs on level ground.  Advance to doing single leg calf raises. Eventually after a few weeks you can advance to doing single leg raise/lower on a step. Straight leg raises, knee extension exercises. Consider half-squats and half-lunges as well. Consider physical therapy 2 times a week. Follow up with me in 6 weeks.

## 2016-11-06 ENCOUNTER — Other Ambulatory Visit: Payer: Self-pay | Admitting: General Practice

## 2016-11-06 ENCOUNTER — Encounter: Payer: Self-pay | Admitting: Family Medicine

## 2016-11-06 MED ORDER — METRONIDAZOLE 500 MG PO TABS: 500.0000 mg | ORAL_TABLET | Freq: Two times a day (BID) | ORAL | 0 refills | Status: DC

## 2016-11-06 MED FILL — metroNIDAZOLE 500 MG TABS: 500 | 7 days supply | Qty: 14 | Fill #0

## 2016-11-09 ENCOUNTER — Ambulatory Visit (HOSPITAL_BASED_OUTPATIENT_CLINIC_OR_DEPARTMENT_OTHER)
Admission: RE | Admit: 2016-11-09 | Discharge: 2016-11-09 | Disposition: A | Discharge status: Home / Self Care | Payer: 59 | Source: Ambulatory Visit | Attending: Family Medicine | Admitting: Family Medicine

## 2016-11-09 DIAGNOSIS — Z1231 Encounter for screening mammogram for malignant neoplasm of breast: Secondary | ICD-10-CM | POA: Diagnosis not present
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
## 2016-11-09 NOTE — Assessment & Plan Note (Signed)
exam is reassuring.  Consistent with strain/spasms of distal quad, anterior tibialis, medial gastroc.  Compression sleeve.  Tylenol or meloxicam as needed.  Shown home exercises and stretches to do daily.  Consider physical therapy if not improving.  F/u in 6 weeks.

## 2016-11-11 ENCOUNTER — Ambulatory Visit: Payer: 59 | Attending: Family Medicine | Admitting: Physical Therapy

## 2016-11-11 DIAGNOSIS — R262 Difficulty in walking, not elsewhere classified: Secondary | ICD-10-CM | POA: Insufficient documentation

## 2016-11-11 DIAGNOSIS — M6281 Muscle weakness (generalized): Secondary | ICD-10-CM | POA: Diagnosis not present

## 2016-11-11 DIAGNOSIS — M79661 Pain in right lower leg: Secondary | ICD-10-CM | POA: Diagnosis not present
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
## 2016-11-11 NOTE — Patient Instructions (Signed)
Heel Raises    Stand with support. Tighten pelvic floor and hold. With knees straight, raise heels off ground.  Repeat _15__ times. Do __2_ times a day.    Gastroc / Heel Cord Stretch - Seated With Towel    Sit on floor, towel around ball of foot. Gently pull foot in toward body, stretching heel cord and calf. Hold for _30__ seconds.  Repeat __3_ times. Do _2__ times per day.

## 2016-11-16 ENCOUNTER — Ambulatory Visit: Payer: 59

## 2016-11-16 ENCOUNTER — Encounter: Payer: Self-pay | Admitting: Family Medicine

## 2016-11-16 DIAGNOSIS — R262 Difficulty in walking, not elsewhere classified: Secondary | ICD-10-CM | POA: Diagnosis not present

## 2016-11-16 DIAGNOSIS — M6281 Muscle weakness (generalized): Secondary | ICD-10-CM | POA: Diagnosis not present

## 2016-11-16 DIAGNOSIS — M79661 Pain in right lower leg: Secondary | ICD-10-CM

## 2016-11-16 NOTE — Therapy (Signed)
[facility] [address]  [place], [place], [address] Phone: [phone]   Fax:  [phone]  Physical Therapy Treatment  Patient Details  Name: Vanessa Reeves MRN: [id number] Date of Birth: 1970-12-30 Referring Provider: Dr. Barbaraann Barthel  Encounter Date: 11/16/2016      PT End of Session - 11/16/16 0938    Visit Number 2   Number of Visits 12   Date for PT Re-Evaluation 12/23/16   PT Start Time 0934   PT Stop Time 1015   PT Time Calculation (min) 41 min   Activity Tolerance Patient tolerated treatment well   Behavior During Therapy [facility] for tasks assessed/performed      Past Medical History:  Diagnosis Date  . Anxiety   . Diabetes mellitus without complication ([place])   . Hypertension     Past Surgical History:  Procedure Laterality Date  . ABDOMINAL HYSTERECTOMY    . DILATION AND CURETTAGE OF UTERUS    . TUBAL LIGATION      There were no vitals filed for this visit.      Subjective Assessment - 11/16/16 0936    Subjective Pt. reports heel cord HEP stretch hurts her R ankle.  Pt. reporting R LE around knee, "feels warm following activities".     Patient Stated Goals reduce pain   Currently in Pain? Yes   Pain Score 7    Pain Location Leg   Pain Orientation Right;Lower   Pain Descriptors / Indicators Throbbing;Dull   Pain Type Chronic pain   Pain Onset More than a month ago   Pain Frequency Constant   Multiple Pain Sites No             [facility] Adult PT Treatment/Exercise - 11/16/16 0955      Manual Therapy   Manual Therapy Passive ROM;Soft tissue mobilization   Manual therapy comments Pt. TTP in posterior calf, popliteal area, lateral Malleolus, and lateral LE surface along peronial muscles   Passive ROM PROM all directions; increased pain with inversion, DF      Ankle Exercises: Stretches   Gastroc Stretch 2 reps;30 seconds     Ankle Exercises: Aerobic   Stationary Bike NuStep: lvl 4,  6 min      Ankle Exercises: Standing   Heel Raises 15 reps;3 seconds   Heel Raises Limitations holding onto counter      Ankle Exercises: Seated   BAPS Sitting;Level 3;15 reps   BAPS Weights (lbs) no weight    BAPS Limitations circles CW, CCW, side<>side, front<>back x 15 reps each; pt. unable to tolerated weight with BAPS board today; mild pain increase with forward/backward      Ankle Exercises: Supine   Other Supine Ankle Exercises R ankles DF, EV, IV with green TB x 15 reps             PT Long Term Goals - 11/16/16 [id number]      PT LONG TERM GOAL #1   Title patient to be independent with advanced HEP (12/23/16)   Status On-going     PT LONG TERM GOAL #2   Title Patient to improve R gastroc DF ROM to 20 degress with no pain production (12/23/16)   Status On-going     PT LONG TERM GOAL #3   Title Patient to improve R LE strength to >/= 4+/5 with no increase in pain (12/23/16)   Status On-going     PT LONG TERM GOAL #4  Title Patient to report ability to ambualte for >/= 30 minutes with pain no greater than 1/10 for greater than 2 weeks (12/23/16)   Status On-going     PT LONG TERM GOAL #5   Title patient to demonstrate ability to ascend/descend 1 flight of stairs reciprocally with pain no greater than 1/10 (12/23/16)   Status On-going               Plan - 11/16/16 0941    Clinical Impression Statement Pt. with 7/10 R lateral ankle pain today which increased to 10/10 with weight BAPS board activities.  Pt. with poor tolerance for all conservative TB strengthening activities today and still very TTP around R lateral/posterior leg and knee.  Pt. reporting some pain with heel cord HEP stretch today despite demonstrating proper technique with this.  Today's treatment focused on conservative ROM and strengthening activities to pt. tolerance.  Will continue to progress these per tolerance in coming visits.     PT Treatment/Interventions ADLs/Self Care Home  Management;Cryotherapy;Electrical Stimulation;Moist Heat;Ultrasound;Neuromuscular re-education;Balance training;Therapeutic exercise;Therapeutic activities;Functional mobility training;Stair training;Gait training;Patient/family education;Manual techniques;Passive range of motion;Vasopneumatic Device;Taping;Dry needling   PT Next Visit Plan gentle stretching and strengthening      Patient will benefit from skilled therapeutic intervention in order to improve the following deficits and impairments:  Decreased activity tolerance, Decreased range of motion, Decreased mobility, Decreased strength, Difficulty walking, Increased muscle spasms, Pain  Visit Diagnosis: Pain in right lower leg  Difficulty in walking, not elsewhere classified  Muscle weakness (generalized)     Problem List Patient Active Problem List   Diagnosis Date Noted  . 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 Reactive airway disease 08/21/2016  . Pain in right lower leg 04/06/2016  . Cough 02/25/2016  . GERD (gastroesophageal reflux disease) 12/20/2015  . Possible exposure to STD 06/07/2015  . Physical exam 12/14/2014  . Maxillary sinusitis, acute 12/14/2014  . Vaginitis and vulvovaginitis 03/30/2014  . Urinary frequency 03/30/2014  . Breast mass, left 02/16/2014  . Edema 02/16/2014  . Chest pain 12/29/2013  . Hypokalemia 08/28/2013  . Diabetes ([place]) 08/28/2013  . Depression with anxiety 07/17/2013  . HTN (hypertension) 07/17/2013  . Eustachian tube dysfunction 07/17/2013  . Severe obesity (BMI >= 40) ([facility]) 07/17/2013    Bess Harvest, PTA 11/16/16 12:52 PM  [facility] [address]  [place], [place], [address] Phone: [phone]   Fax:  [phone]  Name: Vanessa Reeves MRN: [id number] Date of Birth: 1971/10/23

## 2016-11-17 ENCOUNTER — Other Ambulatory Visit: Payer: Self-pay | Admitting: Family Medicine

## 2016-11-17 MED FILL — LOSARTAN-HCTZ 100-25 MG TAB: 100-25 | 30 days supply | Qty: 30 | Fill #5

## 2016-11-17 MED FILL — KLOR-CON M20 TABLET: 20 | 30 days supply | Qty: 60 | Fill #0

## 2016-11-17 MED FILL — MELOXICAM 15 MG TABLET: 15 | 30 days supply | Qty: 30 | Fill #1

## 2016-11-17 MED FILL — METFORMIN HCL ER 500 MG TAB: 500 | 30 days supply | Qty: 30 | Fill #5

## 2016-11-17 MED FILL — DESONIDE 0.05% CREAM: 0.05 | 20 days supply | Qty: 60 | Fill #1

## 2016-11-17 MED FILL — ATENOLOL 50 MG TABLET: 50 | 30 days supply | Qty: 60 | Fill #4

## 2016-11-18 ENCOUNTER — Ambulatory Visit: Payer: 59 | Admitting: Physical Therapy

## 2016-11-23 ENCOUNTER — Ambulatory Visit: Payer: 59

## 2016-11-23 DIAGNOSIS — R262 Difficulty in walking, not elsewhere classified: Secondary | ICD-10-CM

## 2016-11-23 DIAGNOSIS — M6281 Muscle weakness (generalized): Secondary | ICD-10-CM | POA: Diagnosis not present

## 2016-11-23 DIAGNOSIS — M79661 Pain in right lower leg: Secondary | ICD-10-CM | POA: Diagnosis not present

## 2016-11-23 NOTE — Therapy (Signed)
[facility] [address]  [place], [place], [address] Phone: [phone]   Fax:  [phone]  Physical Therapy Treatment  Patient Details  Name: Vanessa Reeves MRN: [id number] Date of Birth: December 29, 1970 Referring Provider: Dr. Barbaraann Barthel  Encounter Date: 11/23/2016      PT End of Session - 11/23/16 0930    Visit Number 3   Number of Visits 12   Date for PT Re-Evaluation 12/23/16   PT Start Time 0930   PT Stop Time 1015   PT Time Calculation (min) 45 min   Activity Tolerance Patient tolerated treatment well   Behavior During Therapy [facility] for tasks assessed/performed      Past Medical History:  Diagnosis Date  . Anxiety   . Diabetes mellitus without complication ([place])   . Hypertension     Past Surgical History:  Procedure Laterality Date  . ABDOMINAL HYSTERECTOMY    . DILATION AND CURETTAGE OF UTERUS    . TUBAL LIGATION      There were no vitals filed for this visit.      Subjective Assessment - 11/23/16 0937    Subjective Pt. reporting she had R buttocks pain over weekend which was relieved by use of physio ball massage to R buttocks by husband.  Pt. reporting significant soreness following last treatment in R lateral lower leg which remained for two days.  Pt. reporting R LE pain, "travels and isn't consistent".     Patient Stated Goals reduce pain   Currently in Pain? Yes   Pain Score 6    Pain Location Leg   Pain Orientation Right   Pain Descriptors / Indicators Throbbing;Dull   Pain Type Chronic pain   Pain Onset More than a month ago            [facility] Adult PT Treatment/Exercise - 11/23/16 0951      Knee/Hip Exercises: Stretches   Passive Hamstring Stretch Right;2 reps;30 seconds   Passive Hamstring Stretch Limitations 2nd set with strap   Piriformis Stretch 2 reps;Right;30 seconds   Piriformis Stretch Limitations 1st set modified pos; 2nd set figure-4 pos     Knee/Hip Exercises:  Aerobic   Recumbent Bike level 1, 6 min      Knee/Hip Exercises: Standing   Hip Flexion AROM;Both;10 reps;Knee straight;Stengthening;2 sets   Hip Flexion Limitations Alternating holding onto counter    Hip Abduction AROM;Both;Stengthening;Knee straight;2 sets   Abduction Limitations Alternating holding onto counter   Hip Extension AROM;Both;10 reps;Stengthening;Knee straight;2 sets   Extension Limitations alternating holding onto counter      Knee/Hip Exercises: Seated   Long Arc Quad AROM;Right;15 reps;2 sets   Long Arc Quad Weight 3 lbs.   Other Seated Knee/Hip Exercises Seated Fitter leg press (1black/1blue) 2 x 15 reps   Hamstring Curl AROM;Right;Strengthening;2 sets;10 reps   Hamstring Limitations with black TB      Ankle Exercises: Seated   Other Seated Ankle Exercises Seated B ankle EV, IV with green looped TB x 20 reps                 PT Education - 11/23/16 1024    Education provided Yes   Education Details modified piriformis stretch, figure-4 piriformis stretch, HS stretch   Person(s) Educated Patient   Methods Explanation;Demonstration;Verbal cues;Handout   Comprehension Verbalized understanding;Returned demonstration;Verbal cues required;Need further instruction             PT Long Term Goals - 11/16/16 [id number]  PT LONG TERM GOAL #1   Title patient to be independent with advanced HEP (12/23/16)   Status On-going     PT LONG TERM GOAL #2   Title Patient to improve R gastroc DF ROM to 20 degress with no pain production (12/23/16)   Status On-going     PT LONG TERM GOAL #3   Title Patient to improve R LE strength to >/= 4+/5 with no increase in pain (12/23/16)   Status On-going     PT LONG TERM GOAL #4   Title Patient to report ability to ambualte for >/= 30 minutes with pain no greater than 1/10 for greater than 2 weeks (12/23/16)   Status On-going     PT LONG TERM GOAL #5   Title patient to demonstrate ability to ascend/descend 1 flight of  stairs reciprocally with pain no greater than 1/10 (12/23/16)   Status On-going               Plan - 11/23/16 0944    Clinical Impression Statement Pt. with subjective reports of R buttocks pain following last treatment and R lateral LE soreness that lasted 2 days.  Pt. with decreased initial pain report today with therex thus standing hip strengthening and seated fitter initiated today.  Pt. tolerating strengthening therex well today however with onset of R buttocks pain following standing hip strengthening activity near end of treatment.  Pt. pain report consistent with R-sided sciatica thus HS and piriformis stretches added to HEP and issued to pt. via handout.  Will plan to monitor response to hip strengthening activities and HEP update in coming treatments.     PT Treatment/Interventions ADLs/Self Care Home Management;Cryotherapy;Electrical Stimulation;Moist Heat;Ultrasound;Neuromuscular re-education;Balance training;Therapeutic exercise;Therapeutic activities;Functional mobility training;Stair training;Gait training;Patient/family education;Manual techniques;Passive range of motion;Vasopneumatic Device;Taping;Dry needling   PT Next Visit Plan Monitor response to HEP update; gentle stretching and strengthening      Patient will benefit from skilled therapeutic intervention in order to improve the following deficits and impairments:  Decreased activity tolerance, Decreased range of motion, Decreased mobility, Decreased strength, Difficulty walking, Increased muscle spasms, Pain  Visit Diagnosis: Pain in right lower leg  Difficulty in walking, not elsewhere classified  Muscle weakness (generalized)     Problem List Patient Active Problem List   Diagnosis Date Noted  . 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 Reactive airway disease 08/21/2016  . Pain in right lower leg 04/06/2016  . Cough 02/25/2016  . GERD (gastroesophageal reflux  disease) 12/20/2015  . Possible exposure to STD 06/07/2015  . Physical exam 12/14/2014  . Maxillary sinusitis, acute 12/14/2014  . Vaginitis and vulvovaginitis 03/30/2014  . Urinary frequency 03/30/2014  . Breast mass, left 02/16/2014  . Edema 02/16/2014  . Chest pain 12/29/2013  . Hypokalemia 08/28/2013  . Diabetes ([place]) 08/28/2013  . Depression with anxiety 07/17/2013  . HTN (hypertension) 07/17/2013  . Eustachian tube dysfunction 07/17/2013  . Severe obesity (BMI >= 40) ([facility]) 07/17/2013    Bess Harvest, PTA 11/23/16 10:32 AM  [facility] [address]  [place], [place], [address] Phone: [phone]   Fax:  [phone]  Name: Vanessa Reeves MRN: [id number] Date of Birth: 1971/10/22

## 2016-11-25 ENCOUNTER — Ambulatory Visit: Payer: 59 | Admitting: Physical Therapy

## 2016-11-27 ENCOUNTER — Ambulatory Visit: Payer: 59

## 2016-11-27 DIAGNOSIS — M6281 Muscle weakness (generalized): Secondary | ICD-10-CM

## 2016-11-27 DIAGNOSIS — R262 Difficulty in walking, not elsewhere classified: Secondary | ICD-10-CM | POA: Diagnosis not present

## 2016-11-27 DIAGNOSIS — M79661 Pain in right lower leg: Secondary | ICD-10-CM

## 2016-11-27 NOTE — Therapy (Addendum)
[facility] [address]  [place], [place], [address] Phone: [phone]   Fax:  [phone]  Physical Therapy Treatment  Patient Details  Name: Vanessa Reeves MRN: [id number] Date of Birth: [birth year] Referring Provider: Dr. Barbaraann Barthel  Encounter Date: 11/27/2016      PT End of Session - 11/27/16 0834    Visit Number 4   Number of Visits 12   Date for PT Re-Evaluation 12/23/16   PT Start Time 0840   PT Stop Time 0939  moist heat to end treatment    PT Time Calculation (min) 59 min   Activity Tolerance Patient tolerated treatment well   Behavior During Therapy [facility] for tasks assessed/performed      Past Medical History:  Diagnosis Date  . Anxiety   . Diabetes mellitus without complication ([place])   . Hypertension     Past Surgical History:  Procedure Laterality Date  . ABDOMINAL HYSTERECTOMY    . DILATION AND CURETTAGE OF UTERUS    . TUBAL LIGATION      There were no vitals filed for this visit.      Subjective Assessment - 11/27/16 0843    Subjective Pt. reporting R buttocks pain gotten worse despite tennis ball massage by husband and consistent HEP stretches to R hip.  Pt. reporting this pain is worse in mornings.     Patient Stated Goals reduce pain   Currently in Pain? Yes   Pain Score 5    Pain Location Leg   Pain Orientation Right;Lower;Lateral;Medial   Pain Descriptors / Indicators Dull;Throbbing   Pain Type Chronic pain   Pain Onset More than a month ago   Pain Frequency Constant   Multiple Pain Sites Yes   Pain Score 5   Pain Location Buttocks   Pain Orientation Right   Pain Descriptors / Indicators Sharp  aggresive    Pain Type Acute pain   Pain Onset In the past 7 days   Pain Frequency Intermittent   Aggravating Factors  mornings   Pain Relieving Factors nothing             [facility] Adult PT Treatment/Exercise - 11/27/16 0850      Knee/Hip Exercises: Stretches   Piriformis  Stretch Right;30 seconds;4 reps   Piriformis Stretch Limitations Figure 4 stretch with towel and strap x 2 ; mod piriformis stretch x 2    Gastroc Stretch 30 seconds;2 reps   Gastroc Stretch Limitations at counter    Soleus Stretch 30 seconds;2 reps   Soleus Stretch Limitations at counter    Other Knee/Hip Stretches Supine R ITB stretch with strap 2 x 30 sec      Knee/Hip Exercises: Aerobic   Nustep NuStep: level 5, 6 min      Knee/Hip Exercises: Standing   Heel Raises Both;15 reps;1 second;3 sets   Hip Flexion AROM;Both;Knee straight;Stengthening;15 reps;1 set   Hip Flexion Limitations Alternating holding onto treadmill    Hip Abduction AROM;Both;Stengthening;Knee straight;1 set;15 reps   Abduction Limitations Alternating holding onto treadmill   Hip Extension AROM;Both;Stengthening;Knee straight;15 reps;1 set   Extension Limitations alternating holding onto treadmill    Functional Squat 2 sets;15 reps   Functional Squat Limitations at counter      Modalities   Modalities Moist Heat     Moist Heat Therapy   Number Minutes Moist Heat 10 Minutes   Moist Heat Location Hip  R hip in L sidelying  Manual Therapy   Manual Therapy Soft tissue mobilization   Manual therapy comments STM/TPR to R piriformis in L sidelying                 PT Education - 11/27/16 0934    Education provided Yes   Education Details Standing hip SLR abduction, extension, flexion, counter squat, Supine ITB stretch with strap   Person(s) Educated Patient   Methods Explanation;Demonstration;Tactile cues;Verbal cues;Handout   Comprehension Verbalized understanding;Returned demonstration;Verbal cues required;Tactile cues required;Need further instruction             PT Long Term Goals - 11/16/16 7106      PT LONG TERM GOAL #1   Title patient to be independent with advanced HEP (12/23/16)   Status On-going     PT LONG TERM GOAL #2   Title Patient to improve R gastroc DF ROM to 20  degress with no pain production (12/23/16)   Status On-going     PT LONG TERM GOAL #3   Title Patient to improve R LE strength to >/= 4+/5 with no increase in pain (12/23/16)   Status On-going     PT LONG TERM GOAL #4   Title Patient to report ability to ambualte for >/= 30 minutes with pain no greater than 1/10 for greater than 2 weeks (12/23/16)   Status On-going     PT LONG TERM GOAL #5   Title patient to demonstrate ability to ascend/descend 1 flight of stairs reciprocally with pain no greater than 1/10 (12/23/16)   Status On-going               Plan - 11/27/16 2694    Clinical Impression Statement Pt. continues to reporting R buttocks pain consistent with piriformis syndrome.  Pt. noting good relief of R buttocks pain with R ITB, piriformis stretching, and STM/TRP to piriformis area.  Pt. noting complete relief of R lower leg/calf pain today following previously listed stretches and STM/TPR.  Moist heat applied to R hip to conclude treatment to reduce muscular guarding and pain.  Pt. reporting daily adherence to current HEP at this point.  HEP updated with ITB stretch, 3-way SLR, and counter squat.  Will monitor response to updated HEP next treatment.     PT Treatment/Interventions ADLs/Self Care Home Management;Cryotherapy;Electrical Stimulation;Moist Heat;Ultrasound;Neuromuscular re-education;Balance training;Therapeutic exercise;Therapeutic activities;Functional mobility training;Stair training;Gait training;Patient/family education;Manual techniques;Passive range of motion;Vasopneumatic Device;Taping;Dry needling   PT Next Visit Plan Monitor response to HEP update; gentle stretching and strengthening      Patient will benefit from skilled therapeutic intervention in order to improve the following deficits and impairments:  Decreased activity tolerance, Decreased range of motion, Decreased mobility, Decreased strength, Difficulty walking, Increased muscle spasms, Pain  Visit  Diagnosis: Pain in right lower leg  Difficulty in walking, not elsewhere classified  Muscle weakness (generalized)     Problem List Patient Active Problem List   Diagnosis Date Noted  . 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 Reactive airway disease 08/21/2016  . Pain in right lower leg 04/06/2016  . Cough 02/25/2016  . GERD (gastroesophageal reflux disease) 12/20/2015  . Possible exposure to STD 06/07/2015  . Physical exam 12/14/2014  . Maxillary sinusitis, acute 12/14/2014  . Vaginitis and vulvovaginitis 03/30/2014  . Urinary frequency 03/30/2014  . Breast mass, left 02/16/2014  . Edema 02/16/2014  . Chest pain 12/29/2013  . Hypokalemia 08/28/2013  . Diabetes ([place]) 08/28/2013  . Depression with anxiety 07/17/2013  . HTN (hypertension) 07/17/2013  .  Eustachian tube dysfunction 07/17/2013  . Severe obesity (BMI >= 40) ([place]) 07/17/2013    Bess Harvest, PTA 11/27/16 10:02 AM   PHYSICAL THERAPY DISCHARGE SUMMARY  Visits from Start of Care: 4  Current functional level related to goals / functional outcomes: Continued pain of LE    Remaining deficits: See above; pain, poor activity tolerance   Education / Equipment: HEP  Plan: Patient agrees to discharge.  Patient goals were not met. Patient is being discharged due to not returning since the last visit.  ?????    Patient not returning since last visit. Per chart review, did follow up with MD with MRI completed and findings of some facet arthropathy and degenerative changes with no evidence of radiculopathic source to explain pain - per Dr. Ericka Pontiff last note. Will gladly see patient in the future for any other PT related needs.   Lanney Gins, PT, DPT 01/07/17 4:22 PM   [facility] [address]  [place], [place], [address] Phone: [phone]   Fax:  [phone]  Name: SUNNIE ODDEN MRN:  [id number] Date of Birth: 1971/04/11

## 2016-12-02 ENCOUNTER — Ambulatory Visit: Payer: 59 | Admitting: Physical Therapy

## 2016-12-04 ENCOUNTER — Ambulatory Visit: Payer: 59 | Attending: Family Medicine | Admitting: Physical Therapy

## 2016-12-07 ENCOUNTER — Telehealth: Payer: Self-pay | Admitting: Family Medicine

## 2016-12-07 ENCOUNTER — Emergency Department (HOSPITAL_BASED_OUTPATIENT_CLINIC_OR_DEPARTMENT_OTHER)
Admission: EM | Admit: 2016-12-07 | Discharge: 2016-12-07 | Disposition: A | Discharge status: Home / Self Care | Payer: 59 | Attending: Emergency Medicine | Admitting: Emergency Medicine

## 2016-12-07 ENCOUNTER — Encounter (HOSPITAL_BASED_OUTPATIENT_CLINIC_OR_DEPARTMENT_OTHER): Payer: Self-pay | Admitting: *Deleted

## 2016-12-07 DIAGNOSIS — I10 Essential (primary) hypertension: Secondary | ICD-10-CM | POA: Insufficient documentation

## 2016-12-07 DIAGNOSIS — F909 Attention-deficit hyperactivity disorder, unspecified type: Secondary | ICD-10-CM | POA: Insufficient documentation

## 2016-12-07 DIAGNOSIS — M79604 Pain in right leg: Secondary | ICD-10-CM | POA: Insufficient documentation

## 2016-12-07 DIAGNOSIS — Z7984 Long term (current) use of oral hypoglycemic drugs: Secondary | ICD-10-CM | POA: Insufficient documentation

## 2016-12-07 DIAGNOSIS — Z87891 Personal history of nicotine dependence: Secondary | ICD-10-CM | POA: Diagnosis not present

## 2016-12-07 DIAGNOSIS — Z79899 Other long term (current) drug therapy: Secondary | ICD-10-CM | POA: Insufficient documentation

## 2016-12-07 DIAGNOSIS — E119 Type 2 diabetes mellitus without complications: Secondary | ICD-10-CM | POA: Insufficient documentation

## 2016-12-07 DIAGNOSIS — G8929 Other chronic pain: Secondary | ICD-10-CM | POA: Diagnosis not present

## 2016-12-07 DIAGNOSIS — M79661 Pain in right lower leg: Secondary | ICD-10-CM | POA: Diagnosis not present

## 2016-12-07 MED ORDER — METHOCARBAMOL 500 MG PO TABS: ORAL_TABLET | ORAL | 0 refills | Status: DC

## 2016-12-07 MED ORDER — METHOCARBAMOL 500 MG PO TABS
1000.0000 mg | ORAL_TABLET | Freq: Once | ORAL | Status: AC
  Administered 2016-12-07: 1000 mg via ORAL
  Filled 2016-12-07: qty 2

## 2016-12-07 MED ORDER — KETOROLAC TROMETHAMINE 60 MG/2ML IM SOLN
30.0000 mg | Freq: Once | INTRAMUSCULAR | Status: AC
Start: 2016-12-07 — End: 2016-12-07
  Administered 2016-12-07: 30 mg via INTRAMUSCULAR
  Filled 2016-12-07: qty 2

## 2016-12-07 MED FILL — METHOCARBAMOL 500 MG TABLET: 500 | 3 days supply | Qty: 20 | Fill #0

## 2016-12-07 NOTE — Telephone Encounter (Signed)
Husband states that he wants a call back regarding his wife from Dr. Birdie Riddle only, he does not want a call from her nurse. When I asked what this was in regards to he said that KT already new and that pt was very sick. I let him know that I would get a message to her nurse for call back. He then asked when would he be getting a call back. I let him know that KT was seeing pts and that as soon as you could you would call him back, maybe later today. Husband stated that if no one called him, he would keep calling. Husband states that this is urgent.

## 2016-12-07 NOTE — Telephone Encounter (Signed)
Pts husband is upset that his wife is in pain. He took her to the ER today and they did not do anything. She needs relief from pain. Husband would like to know if place an order so she can go have an xray or a CT on her leg and if you would give her a muscle relaxer.

## 2016-12-07 NOTE — Telephone Encounter (Signed)
Unfortunately, I am seeing pts and unable to call at this time.  Please see if there's anything they need since it is 'urgent' and I am not able to call back at this time

## 2016-12-07 NOTE — ED Triage Notes (Signed)
Pt reports she was seen 4 weeks ago by her pcp for right leg pain, referred to physical therapy and has been going twice weekly. Pt states "I feel worse than I did to begin with." pt amb with quick steady gait in nad.

## 2016-12-07 NOTE — Discharge Instructions (Signed)
Do not take any milk baked today, you can start taking a remote back again tomorrow, it is fine if you continue to take her acetaminophen today.  For breakthrough pain you may take Robaxin. Do not drink alcohol, drive or operate heavy machinery when taking Robaxin.  Please follow with your primary care doctor in the next 2 days for a check-up. They must obtain records for further management.   Do not hesitate to return to the Emergency Department for any new, worsening or concerning symptoms.

## 2016-12-07 NOTE — ED Provider Notes (Signed)
[facility] Provider Note   CSN: [id number] Arrival date & time: 12/07/16  0955     History   Chief Complaint Chief Complaint  Patient presents with  . Leg Pain   HPI   Vanessa Reeves is a 46 y.o. female with past medical history significant for non-insulin-dependent diabetes, hypertension and obesity complaining of exacerbation of her chronic right leg pain, she states that the leg hurts from the gluteus down to the ankle, states that these had this pain for multiple months however it's exacerbated after she recently initiated 2 courses of physical therapy. States it used to be worse when she stands on the foot however, the pain is constant and severe now. She's been taking low back and Tylenol at home with little relief. She denies back pain, incontinence, fever, chills, history of cancer, history of drug use, swelling or pain in the calf. Patient is very frustrated because she states it is interfering with her ADLs and she is finding it difficult to go forward with her job schooling.  Past Medical History:  Diagnosis Date  . Anxiety   . Diabetes mellitus without complication ([place])   . Hypertension     Patient Active Problem List   Diagnosis Date Noted  . 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 Reactive airway disease 08/21/2016  . Pain in right lower leg 04/06/2016  . Cough 02/25/2016  . GERD (gastroesophageal reflux disease) 12/20/2015  . Possible exposure to STD 06/07/2015  . Physical exam 12/14/2014  . Maxillary sinusitis, acute 12/14/2014  . Vaginitis and vulvovaginitis 03/30/2014  . Urinary frequency 03/30/2014  . Breast mass, left 02/16/2014  . Edema 02/16/2014  . Chest pain 12/29/2013  . Hypokalemia 08/28/2013  . Diabetes ([place]) 08/28/2013  . Depression with anxiety 07/17/2013  . HTN (hypertension) 07/17/2013  . Eustachian tube dysfunction 07/17/2013  . Severe obesity (BMI >= 40) ([place]) 07/17/2013    Past  Surgical History:  Procedure Laterality Date  . ABDOMINAL HYSTERECTOMY    . DILATION AND CURETTAGE OF UTERUS    . TUBAL LIGATION      OB History    Gravida Para Term Preterm AB Living   _0 SAB TAB Ectopic Multiple Live Births   1 1             Home Medications    Prior to Admission medications   Medication Sig Start Date End Date Taking? Authorizing Provider  acetaminophen (TYLENOL) 325 MG tablet Take 650 mg by mouth every 6 (six) hours as needed.    Historical Provider, MD  albuterol (PROVENTIL HFA;VENTOLIN HFA) 108 (90 Base) MCG/ACT inhaler Inhale 2 puffs into the lungs every 6 (six) hours as needed for wheezing or shortness of breath. 02/25/16   Midge Minium, MD  ALL DAY ALLERGY 10 MG tablet TAKE 1 TABLET BY MOUTH ONCE DAILY 11/17/16   Midge Minium, MD  ALPRAZolam Duanne Moron) 0.5 MG tablet TAKE 1 TABLET BY MOUTH TWICE A DAY AS NEEDED Patient not taking: Reported on 11/11/2016 04/29/16   Midge Minium, MD  atenolol (TENORMIN) 50 MG tablet TAKE 2 TABLETS BY MOUTH AT BEDTIME. 02/24/16   Midge Minium, MD  atomoxetine (STRATTERA) 40 MG capsule Take 1 capsule (40 mg total) by mouth daily. 08/21/16   Midge Minium, MD  beclomethasone (QVAR) 80 MCG/ACT inhaler Inhale 1 puff into the lungs 2 (two) times daily. 08/21/16   Midge Minium,  MD  Blood Glucose Monitoring Suppl (CONTOUR NEXT EZ MONITOR) W/DEVICE KIT Please use monitor to test sugar levels once daily. Dx. E11.9 Patient not taking: Reported on 11/11/2016 01/16/15   Midge Minium, MD  desonide (DESOWEN) 0.05 % cream APPLY TO THE AFFECTED AREA(S) TWICE DAILY 08/04/16   Midge Minium, MD  escitalopram (LEXAPRO) 10 MG tablet Take 1 tablet (10 mg total) by mouth daily. 12/19/15   Midge Minium, MD  fluticasone (FLONASE) 50 MCG/ACT nasal spray PLACE 2 SPRAYS INTO BOTH NOSTRILS DAILY. Patient not taking: Reported on 11/11/2016 07/27/16   Midge Minium, MD  furosemide (LASIX) 20 MG tablet  TAKE 1 TABLET BY MOUTH ONCE DAILY 07/21/16   Midge Minium, MD  glucose blood (BAYER CONTOUR TEST) test strip Please use one strip to test glucose levels daily. Dx. E11.9 Patient not taking: Reported on 11/11/2016 01/16/15   Midge Minium, MD  HYDROcodone-acetaminophen [facility]) 10-325 MG tablet  07/30/15   Historical Provider, MD  KLOR-CON M20 20 MEQ tablet TAKE 2 TABLETS BY MOUTH DAILY 11/17/16   Midge Minium, MD  losartan-hydrochlorothiazide (HYZAAR) 100-25 MG tablet Take 1 tablet by mouth daily. 06/12/16   Midge Minium, MD  meloxicam (MOBIC) 15 MG tablet TAKE 1 TABLET BY MOUTH ONCE DAILY 10/22/16   Midge Minium, MD  metFORMIN (GLUCOPHAGE-XR) 500 MG 24 hr tablet TAKE 1 TABLET BY MOUTH ONCE DAILY WITH BREAKFAST 04/29/16   Midge Minium, MD  methocarbamol (ROBAXIN) 500 MG tablet Can take up to 1-2 tabs every 6 hours PRN PAIN 12/07/16   Elmyra Ricks Kemaria Dedic, PA-C  ranitidine (ZANTAC) 300 MG tablet Take 1 tablet (300 mg total) by mouth at bedtime. 12/19/15   Midge Minium, MD  SSS 10-5 10-5 % FOAM APPLY TO THE AFFECTED AREA ONCE DAILY 08/04/16   Midge Minium, MD  valACYclovir (VALTREX) 1000 MG tablet Take 1 tablet (1,000 mg total) by mouth 2 (two) times daily. 07/09/16   Midge Minium, MD    Family History Family History  Problem Relation Age of Onset  . Hypertension Mother   . Stroke Father   . Hypertension Paternal Grandmother   . Diabetes Mellitus II Paternal Grandmother   . Healthy Brother   . Healthy Daughter   . Healthy Son     Social History Social History  Substance Use Topics  . Smoking status: Former Research scientist (life sciences)  . Smokeless tobacco: Never Used  . Alcohol use 0.0 oz/week     Comment: socially     Allergies   Penicillins   Review of Systems Review of Systems  10 systems reviewed and found to be negative, except as noted in the HPI.   Physical Exam Updated Vital Signs BP 136/79 (BP Location: Right Arm)   Pulse 77   Temp 98 F (36.7 C)  (Oral)   Resp 18   Ht _0  (1.626 m)   Wt 136.1 kg   LMP 12/04/2011   SpO2 99%   BMI 51.49 kg/m   Physical Exam  Constitutional: She is oriented to person, place, and time. She appears well-developed and well-nourished. No distress.  HENT:  Head: Normocephalic and atraumatic.  Mouth/Throat: Oropharynx is clear and moist.  Eyes: Conjunctivae and EOM are normal. Pupils are equal, round, and reactive to light.  Neck: Normal range of motion.  Cardiovascular: Normal rate, regular rhythm and intact distal pulses.   Pulmonary/Chest: Effort normal and breath sounds normal.  Abdominal: Soft. There is no tenderness.  Musculoskeletal: Normal range of motion. She exhibits tenderness. She exhibits no edema.  No calf asymmetry, superficial collaterals. Homans sign is negative. Patient is diffusely exquisitely tender to light palpation along the right leg. Straight leg raise elicits severe pain which she states is in the knee. Left-sided straight leg raise without any contralateral pain. Distally neurovascularly intact, can flex hip. No warmth or overlying skin changes to the knee, stable to anterior and posterior drawer, no focal tenderness to palpation along the medial lateral joint lines, no abnormal laxity on valgus or varus stress.  Neurological: She is alert and oriented to person, place, and time.  No point tenderness to percussion of lumbar spinal processes.  No TTP or paraspinal muscular spasm. Strength is 5 out of 5 to bilateral lower extremities at hip and knee; extensor hallucis longus 5 out of 5. Ankle strength 5 out of 5, no clonus, neurovascularly intact. No saddle anaesthesia. Patellar reflexes are 2+ bilaterally.     Skin: She is not diaphoretic.  Psychiatric: She has a normal mood and affect.  Nursing note and vitals reviewed.    ED Treatments / Results  Labs (all labs ordered are listed, but only abnormal results are displayed) Labs Reviewed - No data to display  EKG  EKG  Interpretation None       Radiology No results found.  Procedures Procedures (including critical care time)  Medications Ordered in ED Medications  ketorolac (TORADOL) injection 30 mg (30 mg Intramuscular Given 12/07/16 1241)  methocarbamol (ROBAXIN) tablet 1,000 mg (1,000 mg Oral Given 12/07/16 1241)     Initial Impression / Assessment and Plan / ED Course  I have reviewed the triage vital signs and the nursing notes.  Pertinent labs & imaging results that were available during my care of the patient were reviewed by me and considered in my medical decision making (see chart for details).     Vitals:   12/07/16 1003 12/07/16 1010  BP:  136/79  Pulse:  77  Resp:  18  Temp:  98 F (36.7 C)  TempSrc:  Oral  SpO2:  99%  Weight: 136.1 kg   Height: _0  (1.626 m)     Medications  ketorolac (TORADOL) injection 30 mg (30 mg Intramuscular Given 12/07/16 1241)  methocarbamol (ROBAXIN) tablet 1,000 mg (1,000 mg Oral Given 12/07/16 1241)    DELESA KAWA is 46 y.o. female presenting with Exacerbation of her right lower extremity pain after starting physical therapy. Physical exam is reassuring with no calf asymmetry, swelling or focal tenderness to palpation that would suggest a DVT. This might be a lumbar radiculopathy. I have offered this patient crutches and she declines. We'll give her Toradol and start her on Robaxin. I've encouraged closely with Dr. Barbaraann Barthel and let her physical therapist noted that she's experiencing severe pain. Patient verbalized understanding  Evaluation does not show pathology that would require ongoing emergent intervention or inpatient treatment. Pt is hemodynamically stable and mentating appropriately. Discussed findings and plan with patient/guardian, who agrees with care plan. All questions answered. Return precautions discussed and outpatient follow up given.    Final Clinical Impressions(s) / ED Diagnoses   Final diagnoses:  Chronic pain of right  lower extremity    New Prescriptions Discharge Medication List as of 12/07/2016  1:17 PM    START taking these medications   Details  methocarbamol (ROBAXIN) 500 MG tablet Can take up to 1-2 tabs every 6 hours PRN PAIN, Print  Monico Blitz, PA-C 12/07/16 [place], MD 12/08/16 [phone]

## 2016-12-08 NOTE — Telephone Encounter (Signed)
Left detailed message to inform pt of PCP and reminded of ED recommendation to contact Dr. Barbaraann Barthel sports medicine provider. Message routed to Dr. Barbaraann Barthel per PCP.

## 2016-12-08 NOTE — Telephone Encounter (Signed)
Pt was given muscle relaxer prescription in ER yesterday (Robaxin).  She has been referred to Sports Med (Dr Barbaraann Barthel) so any imaging must come from that office as they are the one treating and following this issue.

## 2016-12-09 ENCOUNTER — Ambulatory Visit (INDEPENDENT_AMBULATORY_CARE_PROVIDER_SITE_OTHER): Payer: 59 | Admitting: Family Medicine

## 2016-12-09 ENCOUNTER — Ambulatory Visit (HOSPITAL_BASED_OUTPATIENT_CLINIC_OR_DEPARTMENT_OTHER)
Admission: RE | Admit: 2016-12-09 | Discharge: 2016-12-09 | Disposition: A | Discharge status: Home / Self Care | Payer: 59 | Source: Ambulatory Visit | Attending: Family Medicine | Admitting: Family Medicine

## 2016-12-09 ENCOUNTER — Encounter: Payer: Self-pay | Admitting: Family Medicine

## 2016-12-09 VITALS — BP 160/127 | HR 59 | Ht 64.0 in | Wt 300.0 lb

## 2016-12-09 DIAGNOSIS — M79661 Pain in right lower leg: Secondary | ICD-10-CM

## 2016-12-09 DIAGNOSIS — M79604 Pain in right leg: Secondary | ICD-10-CM

## 2016-12-09 DIAGNOSIS — M7989 Other specified soft tissue disorders: Secondary | ICD-10-CM | POA: Insufficient documentation

## 2016-12-09 MED ORDER — OXYCODONE-ACETAMINOPHEN 7.5-325 MG PO TABS: 1.0000 | ORAL_TABLET | ORAL | 0 refills | Status: DC | PRN

## 2016-12-09 MED ORDER — TIZANIDINE HCL 4 MG PO TABS: 4.0000 mg | ORAL_TABLET | Freq: Four times a day (QID) | ORAL | 1 refills | Status: DC | PRN

## 2016-12-09 MED ORDER — PREDNISONE 10 MG PO TABS: ORAL_TABLET | ORAL | 0 refills | Status: DC

## 2016-12-09 MED FILL — predniSONE 10 MG TABS: 10 | 6 days supply | Qty: 21 | Fill #0

## 2016-12-09 MED FILL — OXYCODONE-APAP 7.5-325 MG: 7.5-325 | 5 days supply | Qty: 30 | Fill #0

## 2016-12-09 MED FILL — tiZANidine HCL 4 MG TABS: 4 | 10 days supply | Qty: 40 | Fill #0 | Status: TO

## 2016-12-09 NOTE — Patient Instructions (Signed)
Get the doppler ultrasound of your leg today to rule out a blood clot. Try prednisone dose pack for 6 days. Tizanidine as needed for muscle spasms. Oxycodone as needed for severe pain. Assuming the ultrasound is normal would consider MRI of your lumbar spine. Stop the physical therapy.

## 2016-12-10 ENCOUNTER — Telehealth: Payer: Self-pay | Admitting: Family Medicine

## 2016-12-10 NOTE — Telephone Encounter (Signed)
Can you please call her asap, she has called a few times but didn't leave voicemail, thanks.

## 2016-12-10 NOTE — Telephone Encounter (Signed)
MRI scheduled.

## 2016-12-12 ENCOUNTER — Ambulatory Visit (HOSPITAL_BASED_OUTPATIENT_CLINIC_OR_DEPARTMENT_OTHER)
Admission: RE | Admit: 2016-12-12 | Discharge: 2016-12-12 | Disposition: A | Discharge status: Home / Self Care | Payer: 59 | Source: Ambulatory Visit | Attending: Family Medicine | Admitting: Family Medicine

## 2016-12-12 DIAGNOSIS — M1288 Other specific arthropathies, not elsewhere classified, other specified site: Secondary | ICD-10-CM | POA: Insufficient documentation

## 2016-12-12 DIAGNOSIS — M5126 Other intervertebral disc displacement, lumbar region: Secondary | ICD-10-CM | POA: Diagnosis not present

## 2016-12-12 DIAGNOSIS — M79604 Pain in right leg: Secondary | ICD-10-CM | POA: Insufficient documentation
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
## 2016-12-14 NOTE — Assessment & Plan Note (Signed)
again exam is reassuring aside from locations of tenderness.  Given lack of improvement (could not tolerate PT due to pain) will go ahead with MRI to assess for possible radiculopathy.  Doppler u/s negative for DVT.  Trial prednisone dose pack with tizanidine, oxycodone as needed for pain.

## 2016-12-15 ENCOUNTER — Telehealth: Payer: Self-pay | Admitting: Family Medicine

## 2016-12-15 DIAGNOSIS — M79604 Pain in right leg: Secondary | ICD-10-CM

## 2016-12-15 NOTE — Telephone Encounter (Signed)
Ok orders are in for radiographs of her right leg - she just needs to come in downstairs to Imaging and get them done at her convenience.  Please let her know.  Thanks!

## 2016-12-15 NOTE — Telephone Encounter (Signed)
Both the patient and the spouse keep saying 'lumbar spine, and back'.  Sounds strange but she didn't think MRI was needed, just an 'x-ray of her back'.  I'm not sure she understands well enough what you already told her about the specifics of what an MRI shows, thanks

## 2016-12-15 NOTE — Telephone Encounter (Signed)
Are you sure she doesn't mean hip/pelvis?  I can add this too.  But she had an MRI of her back - this shows more than the x-ray and is not necessary.

## 2016-12-15 NOTE — Telephone Encounter (Signed)
Put in x-rays of hip/pelvis also.

## 2016-12-15 NOTE — Telephone Encounter (Signed)
Spoke to patient and told her that she did not need to have an x-ray of the lumbar spine. Told her that we could add on x-ray for the hip/pelvis for the right side. Patient would like to do hip/pelvis x-rays.

## 2016-12-15 NOTE — Telephone Encounter (Signed)
Vanessa Reeves - can you call [facility] and explain x-ray isn't needed for her spine/back - she had MRI of this area (see note below).  Thanks!

## 2016-12-15 NOTE — Telephone Encounter (Signed)
I gave her information but she still insists on getting an x-ray of her back also.  If not, she will need an explanation as to why, thanks.

## 2016-12-15 NOTE — Telephone Encounter (Deleted)
Orders placed will contact patient.

## 2016-12-16 ENCOUNTER — Ambulatory Visit (HOSPITAL_BASED_OUTPATIENT_CLINIC_OR_DEPARTMENT_OTHER)
Admission: RE | Admit: 2016-12-16 | Discharge: 2016-12-16 | Disposition: A | Discharge status: Home / Self Care | Payer: 59 | Source: Ambulatory Visit | Attending: Family Medicine | Admitting: Family Medicine

## 2016-12-16 DIAGNOSIS — M1711 Unilateral primary osteoarthritis, right knee: Secondary | ICD-10-CM | POA: Diagnosis not present

## 2016-12-16 DIAGNOSIS — M549 Dorsalgia, unspecified: Secondary | ICD-10-CM | POA: Diagnosis not present

## 2016-12-16 DIAGNOSIS — M25551 Pain in right hip: Secondary | ICD-10-CM | POA: Diagnosis not present

## 2016-12-16 DIAGNOSIS — M79604 Pain in right leg: Secondary | ICD-10-CM | POA: Diagnosis not present

## 2016-12-16 DIAGNOSIS — M19071 Primary osteoarthritis, right ankle and foot: Secondary | ICD-10-CM | POA: Diagnosis not present

## 2016-12-22 ENCOUNTER — Encounter: Payer: Self-pay | Admitting: Family Medicine

## 2016-12-22 MED ORDER — HYDROCODONE-ACETAMINOPHEN 5-325 MG PO TABS: 1.0000 | ORAL_TABLET | Freq: Four times a day (QID) | ORAL | 0 refills | Status: DC | PRN

## 2016-12-22 NOTE — Telephone Encounter (Signed)
Spoke with patient - encouraged she see Neurology to discuss her symptoms, care.  Advised cannot refill the oxycodone - will step down to hydrocodone.  Reviewed narcotic database.  Also provided letter to be excused from [employer] training.

## 2016-12-29 ENCOUNTER — Other Ambulatory Visit: Payer: Self-pay | Admitting: Family Medicine

## 2016-12-29 MED FILL — HYDROCODON-APAP 5-325: 5-325 | 5 days supply | Qty: 20 | Fill #0

## 2016-12-29 MED FILL — FUROSEMIDE 20 MG TABLET: 20 | 30 days supply | Qty: 30 | Fill #1

## 2016-12-30 MED FILL — METFORMIN HCL ER 500 MG TAB: 500 | 30 days supply | Qty: 30 | Fill #0

## 2016-12-30 MED FILL — raNITIdine HCL 300 MG TABS: 300 | 30 days supply | Qty: 30 | Fill #0

## 2016-12-30 MED FILL — SSS 10-5 FOAM: 10-5 | 30 days supply | Qty: 100 | Fill #0

## 2016-12-30 MED FILL — LOSARTAN-HCTZ 100-25 MG TAB: 100-25 | 30 days supply | Qty: 30 | Fill #0

## 2016-12-30 MED FILL — ATENOLOL 50 MG TABLET: 50 | 30 days supply | Qty: 60 | Fill #0

## 2017-01-07 MED FILL — tiZANidine HCL 4 MG TABS: 4 | 10 days supply | Qty: 40 | Fill #0

## 2017-01-15 ENCOUNTER — Encounter: Payer: Self-pay | Admitting: Family Medicine

## 2017-01-15 DIAGNOSIS — H5211 Myopia, right eye: Secondary | ICD-10-CM | POA: Diagnosis not present

## 2017-01-15 DIAGNOSIS — H52222 Regular astigmatism, left eye: Secondary | ICD-10-CM | POA: Diagnosis not present

## 2017-01-15 DIAGNOSIS — H524 Presbyopia: Secondary | ICD-10-CM | POA: Diagnosis not present

## 2017-01-15 LAB — HM DIABETES EYE EXAM

## 2017-01-18 NOTE — Addendum Note (Signed)
Addended by: Sherrie George F on: 01/18/2017 01:25 PM   Modules accepted: Orders

## 2017-01-26 ENCOUNTER — Encounter: Payer: Self-pay | Admitting: Family Medicine

## 2017-01-26 ENCOUNTER — Encounter: Payer: Self-pay | Admitting: General Practice

## 2017-02-03 MED FILL — METFORMIN HCL ER 500 MG TAB: 500 | 30 days supply | Qty: 30 | Fill #1

## 2017-02-03 MED FILL — LOSARTAN-HCTZ 100-25 MG TAB: 100-25 | 30 days supply | Qty: 30 | Fill #1

## 2017-02-03 MED FILL — ATENOLOL 50 MG TABLET: 50 | 30 days supply | Qty: 60 | Fill #1

## 2017-02-08 ENCOUNTER — Other Ambulatory Visit: Payer: Self-pay | Admitting: Family Medicine

## 2017-02-08 MED FILL — ESCITALOPRAM 10 MG TABLET: 10 | 30 days supply | Qty: 30 | Fill #0

## 2017-02-08 MED FILL — ATOMOXETINE HCL 40 MG CAP: 40 | 30 days supply | Qty: 30 | Fill #1

## 2017-02-08 NOTE — Addendum Note (Signed)
Addended by: Sherrie George F on: 02/08/2017 09:29 AM   Modules accepted: Orders

## 2017-02-08 NOTE — Telephone Encounter (Signed)
Vanessa Reeves - please put in referral for her to neurology for a consultation - not for NCV/EMG testing.  Thanks!

## 2017-02-09 MED FILL — SSS 10-5 FOAM: 10-5 | 30 days supply | Qty: 100 | Fill #1

## 2017-02-18 ENCOUNTER — Telehealth: Payer: Self-pay | Admitting: Family Medicine

## 2017-02-18 NOTE — Telephone Encounter (Signed)
Patient notified

## 2017-02-18 NOTE — Telephone Encounter (Signed)
Working on these- they will be done by end of day

## 2017-02-18 NOTE — Telephone Encounter (Signed)
Patient calling to check the status of FMLA forms.  She states they should have been faxed to her employer but they have not received them as of today. Please advise.

## 2017-02-22 MED FILL — FUROSEMIDE 20 MG TABLET: 20 | 30 days supply | Qty: 30 | Fill #2

## 2017-02-22 MED FILL — POTASSIUM CL ER 20 MEQ TAB: 20 | 30 days supply | Qty: 60 | Fill #1

## 2017-02-22 MED FILL — QVAR 80 MCG ORAL INHALER: 80 | 30 days supply | Qty: 9 | Fill #1

## 2017-03-03 ENCOUNTER — Ambulatory Visit (INDEPENDENT_AMBULATORY_CARE_PROVIDER_SITE_OTHER): Payer: 59 | Admitting: Family Medicine

## 2017-03-03 ENCOUNTER — Other Ambulatory Visit (HOSPITAL_COMMUNITY)
Admission: RE | Admit: 2017-03-03 | Discharge: 2017-03-03 | Disposition: A | Discharge status: Home / Self Care | Payer: 59 | Source: Ambulatory Visit | Attending: Family Medicine | Admitting: Family Medicine

## 2017-03-03 ENCOUNTER — Encounter: Payer: Self-pay | Admitting: Family Medicine

## 2017-03-03 VITALS — BP 128/84 | HR 83 | Temp 98.8°F | Resp 16 | Ht 64.0 in | Wt 296.5 lb

## 2017-03-03 DIAGNOSIS — E119 Type 2 diabetes mellitus without complications: Secondary | ICD-10-CM

## 2017-03-03 DIAGNOSIS — N761 Subacute and chronic vaginitis: Secondary | ICD-10-CM

## 2017-03-03 DIAGNOSIS — Z111 Encounter for screening for respiratory tuberculosis: Secondary | ICD-10-CM

## 2017-03-03 DIAGNOSIS — Z0184 Encounter for antibody response examination: Secondary | ICD-10-CM

## 2017-03-03 DIAGNOSIS — Z0000 Encounter for general adult medical examination without abnormal findings: Secondary | ICD-10-CM

## 2017-03-03 LAB — CBC WITH DIFFERENTIAL/PLATELET
BASOS PCT: 0.4 % (ref 0.0–3.0)
Basophils Absolute: 0 10*3/uL (ref 0.0–0.1)
EOS ABS: 0.1 10*3/uL (ref 0.0–0.7)
Eosinophils Relative: 1.6 % (ref 0.0–5.0)
HEMATOCRIT: 35.8 % — AB (ref 36.0–46.0)
Hemoglobin: 11.7 g/dL — ABNORMAL LOW (ref 12.0–15.0)
LYMPHS ABS: 3.3 10*3/uL (ref 0.7–4.0)
LYMPHS PCT: 40.8 % (ref 12.0–46.0)
MCHC: 32.7 g/dL (ref 30.0–36.0)
MCV: 85.4 fl (ref 78.0–100.0)
Monocytes Absolute: 0.6 10*3/uL (ref 0.1–1.0)
Monocytes Relative: 8 % (ref 3.0–12.0)
NEUTROS ABS: 3.9 10*3/uL (ref 1.4–7.7)
Neutrophils Relative %: 49.2 % (ref 43.0–77.0)
PLATELETS: 450 10*3/uL — AB (ref 150.0–400.0)
RBC: 4.2 Mil/uL (ref 3.87–5.11)
RDW: 15.1 % (ref 11.5–15.5)
WBC: 8 10*3/uL (ref 4.0–10.5)

## 2017-03-03 LAB — LIPID PANEL
CHOLESTEROL: 154 mg/dL (ref 0–200)
HDL: 53.9 mg/dL (ref >39.00)
LDL Cholesterol: 81 mg/dL (ref 0–99)
NonHDL: 100.41
TRIGLYCERIDES: 97 mg/dL (ref 0.0–149.0)
Total CHOL/HDL Ratio: 3
VLDL: 19.4 mg/dL (ref 0.0–40.0)

## 2017-03-03 LAB — BASIC METABOLIC PANEL
BUN: 20 mg/dL (ref 6–23)
CHLORIDE: 96 meq/L (ref 96–112)
CO2: 30 meq/L (ref 19–32)
Calcium: 9.9 mg/dL (ref 8.4–10.5)
Creatinine, Ser: 0.96 mg/dL (ref 0.40–1.20)
GFR: 81.06 mL/min (ref >60.00)
GLUCOSE: 96 mg/dL (ref 70–99)
POTASSIUM: 3.9 meq/L (ref 3.5–5.1)
SODIUM: 136 meq/L (ref 135–145)

## 2017-03-03 LAB — VITAMIN D 25 HYDROXY (VIT D DEFICIENCY, FRACTURES): VITD: 13.56 ng/mL — ABNORMAL LOW (ref 30.00–100.00)

## 2017-03-03 LAB — HEPATIC FUNCTION PANEL
ALK PHOS: 75 U/L (ref 39–117)
ALT: 12 U/L (ref 0–35)
AST: 11 U/L (ref 0–37)
Albumin: 4 g/dL (ref 3.5–5.2)
BILIRUBIN DIRECT: 0.1 mg/dL (ref 0.0–0.3)
Total Bilirubin: 0.4 mg/dL (ref 0.2–1.2)
Total Protein: 7.9 g/dL (ref 6.0–8.3)

## 2017-03-03 LAB — TSH: TSH: 2.14 u[IU]/mL (ref 0.35–4.50)

## 2017-03-03 LAB — HEMOGLOBIN A1C: HEMOGLOBIN A1C: 6.7 % — AB (ref 4.6–6.5)

## 2017-03-03 NOTE — Patient Instructions (Signed)
Follow up in 3-4 months to recheck diabetes We'll notify you of your lab results and make any changes if needed Continue to work on healthy diet and regular exercise Call with any questions or concerns I am SO proud of you!!!

## 2017-03-03 NOTE — Progress Notes (Signed)
   Subjective:    Patient ID: Vanessa Reeves, female    DOB: 1971-02-07, 46 y.o.   MRN: [id number]  HPI CPE- UTD on mammo, UTD on vaccines.  No need for pap due to hysterectomy.   Review of Systems Patient reports no vision/ hearing changes, adenopathy,fever, weight change,  persistant/recurrent hoarseness , swallowing issues, chest pain, palpitations, edema, persistant/recurrent cough, hemoptysis, dyspnea (rest/exertional/paroxysmal nocturnal), gastrointestinal bleeding (melena, rectal bleeding), abdominal pain, significant heartburn, bowel changes, GU symptoms (dysuria, hematuria, incontinence), Gyn symptoms (abnormal  bleeding, pain),  syncope, focal weakness, memory loss, numbness & tingling, skin/hair/nail changes, abnormal bruising or bleeding, anxiety, or depression.     Objective:   Physical Exam General Appearance:    Alert, cooperative, no distress, appears stated age, obese  Head:    Normocephalic, without obvious abnormality, atraumatic  Eyes:    PERRL, conjunctiva/corneas clear, EOM's intact, fundi    benign, both eyes  Ears:    Normal TM's and external ear canals, both ears  Nose:   Nares normal, septum midline, mucosa normal, no drainage    or sinus tenderness  Throat:   Lips, mucosa, and tongue normal; teeth and gums normal  Neck:   Supple, symmetrical, trachea midline, no adenopathy;    Thyroid: no enlargement/tenderness/nodules  Back:     Symmetric, no curvature, ROM normal, no CVA tenderness  Lungs:     Clear to auscultation bilaterally, respirations unlabored  Chest Wall:    No tenderness or deformity   Heart:    Regular rate and rhythm, S1 and S2 normal, no murmur, rub   or gallop  Breast Exam:    Deferred to mammo  Abdomen:     Soft, non-tender, bowel sounds active all four quadrants,    no masses, no organomegaly  Genitalia:    Deferred  Rectal:    Extremities:   Extremities normal, atraumatic, no cyanosis or edema  Pulses:   2+ and symmetric all extremities   Skin:   Skin color, texture, turgor normal, no rashes or lesions  Lymph nodes:   Cervical, supraclavicular, and axillary nodes normal  Neurologic:   CNII-XII intact, normal strength, sensation and reflexes    throughout          Assessment & Plan:

## 2017-03-03 NOTE — Progress Notes (Signed)
Pre visit review using our clinic review tool, if applicable. No additional management support is needed unless otherwise documented below in the visit note. 

## 2017-03-03 NOTE — Assessment & Plan Note (Signed)
Chronic problem, on Metformin daily.  UTD on eye exam, due for foot exam- done today.  On ARB for renal protection.  Stressed need for healthy diet and regular exercise.  Check labs.  Adjust meds prn

## 2017-03-03 NOTE — Assessment & Plan Note (Signed)
Pt's PE WNL w/ exception of obesity.  UTD on mammo.  No need for pap due to hysterectomy.  UTD on immunizations.  Check labs.  Anticipatory guidance provided.

## 2017-03-04 ENCOUNTER — Other Ambulatory Visit: Payer: Self-pay | Admitting: General Practice

## 2017-03-04 LAB — VARICELLA ZOSTER ANTIBODY, IGG: Varicella IgG: 2583 Index — ABNORMAL HIGH (ref <135.00)

## 2017-03-04 LAB — MEASLES/MUMPS/RUBELLA IMMUNITY
Mumps IgG: >300 AU/mL — ABNORMAL HIGH (ref <9.00)
Rubella: <0.9 Index (ref <0.90)
Rubeola IgG: >300 AU/mL — ABNORMAL HIGH (ref <25.00)

## 2017-03-04 LAB — HEPATITIS B SURFACE ANTIBODY,QUALITATIVE: HEP B S AB: POSITIVE — AB

## 2017-03-04 MED ORDER — VITAMIN D (ERGOCALCIFEROL) 1.25 MG (50000 UNIT) PO CAPS: 50000.0000 [IU] | ORAL_CAPSULE | ORAL | 0 refills | Status: DC

## 2017-03-04 MED FILL — VIT D2 1.25 MG (50,000 UNIT: 1.25 MG | 84 days supply | Qty: 12 | Fill #0

## 2017-03-05 LAB — QUANTIFERON TB GOLD ASSAY (BLOOD)
Interferon Gamma Release Assay: NEGATIVE
Mitogen-Nil: 9.75 [IU]/mL
Quantiferon Nil Value: 0.03 [IU]/mL
Quantiferon Tb Ag Minus Nil Value: 0.01 [IU]/mL

## 2017-03-05 NOTE — Progress Notes (Signed)
Called pt and lmovm to return call.

## 2017-03-07 ENCOUNTER — Encounter: Payer: Self-pay | Admitting: Family Medicine

## 2017-03-08 ENCOUNTER — Other Ambulatory Visit: Payer: Self-pay | Admitting: General Practice

## 2017-03-08 ENCOUNTER — Encounter: Payer: Self-pay | Admitting: Family Medicine

## 2017-03-08 ENCOUNTER — Ambulatory Visit (INDEPENDENT_AMBULATORY_CARE_PROVIDER_SITE_OTHER): Payer: 59 | Admitting: Family Medicine

## 2017-03-08 VITALS — BP 120/78 | HR 90 | Temp 98.5°F | Resp 16 | Ht 64.0 in | Wt 297.0 lb

## 2017-03-08 DIAGNOSIS — L0100 Impetigo, unspecified: Secondary | ICD-10-CM | POA: Diagnosis not present

## 2017-03-08 DIAGNOSIS — Z23 Encounter for immunization: Secondary | ICD-10-CM

## 2017-03-08 LAB — URINE CYTOLOGY ANCILLARY ONLY
BACTERIAL VAGINITIS: POSITIVE — AB
CANDIDA VAGINITIS: NEGATIVE

## 2017-03-08 MED ORDER — MUPIROCIN 2 % EX OINT: 1.0000 "application " | TOPICAL_OINTMENT | Freq: Two times a day (BID) | CUTANEOUS | 0 refills | Status: DC

## 2017-03-08 MED ORDER — DOXYCYCLINE HYCLATE 100 MG PO TABS: 100.0000 mg | ORAL_TABLET | Freq: Two times a day (BID) | ORAL | 0 refills | Status: DC

## 2017-03-08 MED ORDER — METRONIDAZOLE 500 MG PO TABS: 500.0000 mg | ORAL_TABLET | Freq: Two times a day (BID) | ORAL | 0 refills | Status: DC

## 2017-03-08 MED FILL — METFORMIN HCL ER 500 MG TAB: 500 | 30 days supply | Qty: 30 | Fill #2

## 2017-03-08 MED FILL — DOXYCYCLINE HYC 100 MG TAB: 100 | 7 days supply | Qty: 14 | Fill #0

## 2017-03-08 MED FILL — metroNIDAZOLE 500 MG TABS: 500 | 7 days supply | Qty: 14 | Fill #0

## 2017-03-08 MED FILL — MUPIROCIN 2% OINTMENT: 2 | 14 days supply | Qty: 22 | Fill #0

## 2017-03-08 MED FILL — ATENOLOL 50 MG TABLET: 50 | 30 days supply | Qty: 60 | Fill #2

## 2017-03-08 NOTE — Progress Notes (Signed)
   Subjective:    Patient ID: Vanessa Reeves, female    DOB: 02-25-1971, 46 y.o.   MRN: [id number]  HPI Facial infxn- pt has hx of similar.  Developed itching on cheek under R eye and yesterday awoke w/ puffy, scabbed area.  No oozing but yellow crusting.  Area remains itchy.  No pain.     Review of Systems For ROS see HPI     Objective:   Physical Exam  Constitutional: Vanessa Reeves is oriented to person, place, and time. Vanessa Reeves appears well-developed and well-nourished. No distress.  obese  HENT:  Head: Normocephalic and atraumatic.  Neurological: Vanessa Reeves is alert and oriented to person, place, and time.  Skin: Skin is warm and dry. No erythema.  Mild facial swelling of R cheek w/ yellow crusted lesion consistent w/ impetigo  Psychiatric: Vanessa Reeves has a normal mood and affect. Her behavior is normal. Thought content normal.  Vitals reviewed.         Assessment & Plan:  Impetigo- new.  Pt's lesion is consistent w/ impetigo.  Reviewed dx and tx w/ pt.  Due to PCN allergy and upcoming graduation will not try Keflex today as pt does not want to risk getting hives.  Start Doxy and topical Mupirocin.  Reviewed supportive care and red flags that should prompt return.  Pt expressed understanding and is in agreement w/ plan.

## 2017-03-08 NOTE — Patient Instructions (Signed)
Follow up as needed/scheduled This appears to be Impetigo- an infection of the skin Use the Mupirocin ointment twice daily Take the Doxycycline twice daily- take w/ food Try and avoid picking/scratching Call with any questions or concerns CONGRATS ON GRADUATION!!!

## 2017-03-08 NOTE — Progress Notes (Signed)
Pre visit review using our clinic review tool, if applicable. No additional management support is needed unless otherwise documented below in the visit note. 

## 2017-03-10 ENCOUNTER — Encounter: Payer: Self-pay | Admitting: Family Medicine

## 2017-03-10 ENCOUNTER — Ambulatory Visit: Payer: 59 | Admitting: Neurology

## 2017-03-12 ENCOUNTER — Ambulatory Visit: Payer: 59 | Admitting: Family Medicine

## 2017-03-17 ENCOUNTER — Ambulatory Visit (INDEPENDENT_AMBULATORY_CARE_PROVIDER_SITE_OTHER): Payer: 59 | Admitting: Neurology

## 2017-03-17 ENCOUNTER — Encounter: Payer: Self-pay | Admitting: Neurology

## 2017-03-17 VITALS — BP 120/91 | HR 76 | Ht 64.0 in | Wt 301.5 lb

## 2017-03-17 DIAGNOSIS — R202 Paresthesia of skin: Secondary | ICD-10-CM | POA: Diagnosis not present

## 2017-03-17 DIAGNOSIS — G2581 Restless legs syndrome: Secondary | ICD-10-CM

## 2017-03-17 DIAGNOSIS — M79661 Pain in right lower leg: Secondary | ICD-10-CM

## 2017-03-17 HISTORY — DX: Restless legs syndrome: G25.81

## 2017-03-17 NOTE — Progress Notes (Signed)
Reason for visit: Right leg pain  Referring physician: Dr. Hassel Neth is a 46 y.o. female  History of present illness:  Ms. Friesenhahn is a 46 year old right-handed black female with a history of diabetes and morbid obesity. The patient has had a two-year history of right leg discomfort going from the right buttocks down to the lower leg just above the ankle. The patient has had EMG and nerve conduction study done in the past, a peripheral neuropathy was not seen, the patient could not tolerate EMG study on the right leg. The patient has been using meloxicam and Tylenol with some benefit. Over time, the leg pain has gradually worsened. She reports restless leg syndrome also on the right. The patient has had not had true weakness of the right leg, she denies any numbness. The pain is present at all times, but may worsen at times. The patient denies issues controlling the bowels or the bladder. She has no pain on the left leg. She denies discomfort in the neck or in the arms. She has not had any change in balance. MRI of the low back has been done, this appears to be relatively unremarkable, no surgically amenable issue that would be causing her right leg pain was identified. The patient also reports some numbness of the right hand that has been present over the last several months. She does have diabetes, generally her hemoglobin A1c values have been under 7.  Past Medical History:  Diagnosis Date  . Anxiety   . Diabetes mellitus without complication ([place])   . Herpes   . Hypertension     Past Surgical History:  Procedure Laterality Date  . ABDOMINAL HYSTERECTOMY    . DILATION AND CURETTAGE OF UTERUS    . TUBAL LIGATION      Family History  Problem Relation Age of Onset  . Hypertension Mother   . Stroke Father   . Hypertension Paternal Grandmother   . Diabetes Mellitus II Paternal Grandmother   . Healthy Brother   . Healthy Daughter   . Healthy Son     Social history:   reports that she has quit smoking. She has never used smokeless tobacco. She reports that she drinks alcohol. She reports that she does not use drugs.  Medications:  Prior to Admission medications   Medication Sig Start Date End Date Taking? Authorizing Provider  acetaminophen (TYLENOL) 325 MG tablet Take 650 mg by mouth every 6 (six) hours as needed.   Yes [provider]  albuterol (PROVENTIL HFA;VENTOLIN HFA) 108 (90 Base) MCG/ACT inhaler Inhale 2 puffs into the lungs every 6 (six) hours as needed for wheezing or shortness of breath. 02/25/16  Yes Midge Minium, MD  ALL DAY ALLERGY 10 MG tablet TAKE 1 TABLET BY MOUTH ONCE DAILY 11/17/16  Yes Midge Minium, MD  atenolol (TENORMIN) 50 MG tablet TAKE 2 TABLETS BY MOUTH AT BEDTIME. 12/30/16  Yes Midge Minium, MD  beclomethasone (QVAR) 80 MCG/ACT inhaler Inhale 1 puff into the lungs 2 (two) times daily. 08/21/16  Yes Midge Minium, MD  desonide (DESOWEN) 0.05 % cream APPLY TO THE AFFECTED AREA(S) TWICE DAILY 08/04/16  Yes Midge Minium, MD  escitalopram (LEXAPRO) 10 MG tablet TAKE 1 TABLET BY MOUTH DAILY. Patient taking differently: TAKE 1 TABLET BY MOUTH DAILY., prn 02/08/17  Yes Midge Minium, MD  fluticasone (FLONASE) 50 MCG/ACT nasal spray PLACE 2 SPRAYS INTO BOTH NOSTRILS DAILY. Patient taking differently: Place 2  sprays into both nostrils as needed.  07/27/16  Yes Midge Minium, MD  furosemide (LASIX) 20 MG tablet TAKE 1 TABLET BY MOUTH ONCE DAILY 07/21/16  Yes Midge Minium, MD  KLOR-CON M20 20 MEQ tablet TAKE 2 TABLETS BY MOUTH DAILY 11/17/16  Yes Midge Minium, MD  losartan-hydrochlorothiazide (HYZAAR) 100-25 MG tablet TAKE 1 TABLET BY MOUTH ONCE DAILY 12/30/16  Yes Midge Minium, MD  metFORMIN (GLUCOPHAGE-XR) 500 MG 24 hr tablet TAKE 1 TABLET BY MOUTH ONCE DAILY WITH BREAKFAST 12/30/16  Yes Midge Minium, MD  mupirocin ointment (BACTROBAN) 2 % Apply 1 application topically 2  (two) times daily. 03/08/17  Yes Midge Minium, MD  ranitidine (ZANTAC) 300 MG tablet TAKE 1 TABLET BY MOUTH AT BEDTIME. 12/30/16  Yes Midge Minium, MD  SSS 10-5 10-5 % FOAM APPLY TO THE AFFECTED AREA ONCE DAILY 12/30/16  Yes Midge Minium, MD  tiZANidine (ZANAFLEX) 4 MG tablet Take 1 tablet (4 mg total) by mouth every 6 (six) hours as needed for muscle spasms. 12/09/16  Yes Hudnall, [name], MD  valACYclovir (VALTREX) 1000 MG tablet Take 1 tablet (1,000 mg total) by mouth 2 (two) times daily. Patient taking differently: Take 1,000 mg by mouth as needed.  07/09/16  Yes Midge Minium, MD  Vitamin D, Ergocalciferol, (DRISDOL) 50000 units CAPS capsule Take 1 capsule (50,000 Units total) by mouth every 7 (seven) days. 03/04/17  Yes Midge Minium, MD      Allergies  Allergen Reactions  . Penicillins Other (See Comments)    Childhood allergy.    ROS:  Out of a complete 14 system review of symptoms, the patient complains only of the following symptoms, and all other reviewed systems are negative.  Fatigue Swelling in the legs Cough, wheezing Feeling hot, cold, increased thirst Joint pain, achy muscles Allergies, runny nose Headache, numbness, dizziness Insomnia, restless legs  Blood pressure (!) 120/91, pulse 76, height 5\' 4"  (1.626 m), weight (!) 301 lb 8 oz (136.8 kg), last menstrual period 12/04/2011.  Physical Exam  General: The patient is alert and cooperative at the time of the examination. The patient is morbidly obese.  Eyes: Pupils are equal, round, and reactive to light. Discs are flat bilaterally.  Neck: The neck is supple, no carotid bruits are noted.  Respiratory: The respiratory examination is clear.  Cardiovascular: The cardiovascular examination reveals a regular rate and rhythm, no obvious murmurs or rubs are noted.  Neuromuscular: Range of movement the low back is full and normal. No discomfort is noted with palpation of the midline of the  back, the patient has some discomfort over the right SI joint, pressure over this area will elicit pain down the right leg.  Skin: Extremities are without significant edema.  Neurologic Exam  Mental status: The patient is alert and oriented x 3 at the time of the examination. The patient has apparent normal recent and remote memory, with an apparently normal attention span and concentration ability.  Cranial nerves: Facial symmetry is present. There is good sensation of the face to pinprick and soft touch on the left, decreased on the right. The patient does not split the midline of the forehead with vibration sensation. The strength of the facial muscles and the muscles to head turning and shoulder shrug are normal bilaterally. Speech is well enunciated, no aphasia or dysarthria is noted. Extraocular movements are full. Visual fields are full. The tongue is midline, and the patient has symmetric elevation  of the soft palate. No obvious hearing deficits are noted.  Motor: The motor testing reveals 5 over 5 strength of all 4 extremities. Good symmetric motor tone is noted throughout.  Sensory: Sensory testing is notable for a decrease in pinprick sensation and vibration sensation on the right arm and right leg. Position sense is intact on all 4 extremities. No evidence of extinction is noted.  Coordination: Cerebellar testing reveals good finger-nose-finger and heel-to-shin bilaterally. Tinel's sign at the wrist is positive on the right, negative on the left.  Gait and station: Gait is normal. Tandem gait is normal. Romberg is negative. No drift is seen.  Reflexes: Deep tendon reflexes are symmetric and normal bilaterally. The patient has well maintained ankle jerk reflexes bilaterally. Toes are downgoing bilaterally.   MRI lumbar 12/12/16:  IMPRESSION: 1. At L4-5 there is a mild broad-based disc bulge. Moderate bilateral facet arthropathy. Bilateral lateral recess narrowing. 2. At L5-S1  there is mild bilateral facet arthropathy.  * MRI scan images were reviewed online. I agree with the written report.    Assessment/Plan:  1. Possible right SI joint dysfunction, right leg pain  2. Right hemisensory deficit by clinical examination  3. Right restless leg syndrome  4. Right hand numbness, possible carpal tunnel syndrome  The patient has multiple issues. The patient has chronic right leg pain that may potentially represent a SI joint dysfunction syndrome. The patient will be set up for an SI joint injection to help treat the problem and to help confirm the diagnosis. The patient will be set for a CT scan of the brain to evaluate the right hemisensory deficit to exclude a small vessel stroke. The patient does have risk factors for stroke with diabetes and obesity. The patient will wear a wrist splint on the right wrist to treat presumed carpal tunnel syndrome. If this does not improve, EMG and nerve conduction study will be done in the future. She will follow-up in 6 months.  Jill Alexanders MD 03/17/2017 9:35 AM  [facility] [address] [place],  [address]  Phone [phone] Fax [phone]

## 2017-03-17 NOTE — Patient Instructions (Signed)
   We will check ct of the head to evaluate the right sided numbness. We will get a right SI joint injection. Wear a wrist splint on the right wrist for possible carpal tunnel syndrome.

## 2017-03-18 ENCOUNTER — Other Ambulatory Visit: Payer: Self-pay | Admitting: Neurology

## 2017-03-18 DIAGNOSIS — M79661 Pain in right lower leg: Secondary | ICD-10-CM

## 2017-03-18 DIAGNOSIS — R202 Paresthesia of skin: Secondary | ICD-10-CM | POA: Diagnosis not present

## 2017-03-18 MED FILL — LOSARTAN-HCTZ 100-25 MG TAB: 100-25 | 30 days supply | Qty: 30 | Fill #2

## 2017-03-19 ENCOUNTER — Ambulatory Visit
Admission: RE | Admit: 2017-03-19 | Discharge: 2017-03-19 | Disposition: A | Discharge status: Home / Self Care | Payer: 59 | Source: Ambulatory Visit | Attending: Neurology | Admitting: Neurology

## 2017-03-19 ENCOUNTER — Telehealth: Payer: Self-pay | Admitting: Neurology

## 2017-03-19 DIAGNOSIS — R202 Paresthesia of skin: Secondary | ICD-10-CM

## 2017-03-19 NOTE — Telephone Encounter (Signed)
The patient was called, the CT of the head shows no explanation for the right hemisensory deficit. She will be going for an right SI joint injection next week.   CT head 03/19/17:  IMPRESSION:  This CT scan of the head without contrast shows the following: 1.   The brain parenchyma appears normal. 2.   There is an enlarged sella turcica noted on the reconstructed sagittal images. This is of unclear significance and could be incidental.   However, if the patient is experiencing headaches or has papilledema, consider idiopathic intracranial hypertension..  3.   Incidental note is made of increased calcification of the falx cerebri, unlikely to be clinically significant.

## 2017-03-22 ENCOUNTER — Encounter: Payer: Self-pay | Admitting: Family Medicine

## 2017-03-23 ENCOUNTER — Other Ambulatory Visit: Payer: Self-pay | Admitting: Family Medicine

## 2017-03-23 ENCOUNTER — Encounter: Payer: Self-pay | Admitting: Family Medicine

## 2017-03-23 MED FILL — SSS 10-5 FOAM: 10-5 | 30 days supply | Qty: 100 | Fill #0

## 2017-03-24 ENCOUNTER — Ambulatory Visit
Admission: RE | Admit: 2017-03-24 | Discharge: 2017-03-24 | Disposition: A | Discharge status: Home / Self Care | Payer: 59 | Source: Ambulatory Visit | Attending: Neurology | Admitting: Neurology

## 2017-03-24 DIAGNOSIS — M79661 Pain in right lower leg: Secondary | ICD-10-CM

## 2017-03-24 DIAGNOSIS — M533 Sacrococcygeal disorders, not elsewhere classified: Secondary | ICD-10-CM | POA: Diagnosis not present

## 2017-03-24 MED ORDER — METHYLPREDNISOLONE ACETATE 40 MG/ML INJ SUSP (RADIOLOG
120.0000 mg | Freq: Once | INTRAMUSCULAR | Status: AC
  Administered 2017-03-24: 120 mg via INTRA_ARTICULAR

## 2017-03-24 MED ORDER — IOPAMIDOL (ISOVUE-M 200) INJECTION 41%
1.0000 mL | Freq: Once | INTRAMUSCULAR | Status: AC
  Administered 2017-03-24: 1 mL via INTRA_ARTICULAR

## 2017-03-24 NOTE — Discharge Instructions (Signed)

## 2017-03-25 ENCOUNTER — Encounter: Payer: Self-pay | Admitting: *Deleted

## 2017-03-25 ENCOUNTER — Encounter: Payer: Self-pay | Admitting: Family Medicine

## 2017-03-26 ENCOUNTER — Ambulatory Visit: Payer: 59

## 2017-03-30 ENCOUNTER — Encounter: Payer: 59 | Admitting: Family Medicine

## 2017-03-30 ENCOUNTER — Ambulatory Visit: Payer: 59

## 2017-04-05 ENCOUNTER — Ambulatory Visit (INDEPENDENT_AMBULATORY_CARE_PROVIDER_SITE_OTHER): Payer: 59 | Admitting: *Deleted

## 2017-04-05 DIAGNOSIS — Z23 Encounter for immunization: Secondary | ICD-10-CM

## 2017-04-05 MED FILL — ATENOLOL 50 MG TABLET: 50 | 30 days supply | Qty: 60 | Fill #3

## 2017-04-05 MED FILL — METFORMIN HCL ER 500 MG TAB: 500 | 30 days supply | Qty: 30 | Fill #3

## 2017-04-05 MED FILL — POTASSIUM CL ER 20 MEQ TAB: 20 | 30 days supply | Qty: 60 | Fill #2

## 2017-04-05 NOTE — Progress Notes (Signed)
Patient tolerated well.

## 2017-04-07 ENCOUNTER — Other Ambulatory Visit: Payer: Self-pay | Admitting: Family Medicine

## 2017-04-07 NOTE — Telephone Encounter (Signed)
Please advise this was filled on 03/17/17 #14 with 0

## 2017-04-08 NOTE — Telephone Encounter (Signed)
Called pt and LMOVM to inform that per Dr. Birdie Riddle this medication cannot be refilled.

## 2017-04-15 ENCOUNTER — Emergency Department (HOSPITAL_BASED_OUTPATIENT_CLINIC_OR_DEPARTMENT_OTHER)
Admission: EM | Admit: 2017-04-15 | Discharge: 2017-04-15 | Disposition: A | Discharge status: Home / Self Care | Payer: 59 | Attending: Emergency Medicine | Admitting: Emergency Medicine

## 2017-04-15 ENCOUNTER — Encounter (HOSPITAL_BASED_OUTPATIENT_CLINIC_OR_DEPARTMENT_OTHER): Payer: Self-pay | Admitting: Emergency Medicine

## 2017-04-15 DIAGNOSIS — Z79899 Other long term (current) drug therapy: Secondary | ICD-10-CM | POA: Diagnosis not present

## 2017-04-15 DIAGNOSIS — Z7982 Long term (current) use of aspirin: Secondary | ICD-10-CM | POA: Diagnosis not present

## 2017-04-15 DIAGNOSIS — I10 Essential (primary) hypertension: Secondary | ICD-10-CM | POA: Diagnosis not present

## 2017-04-15 DIAGNOSIS — Z7984 Long term (current) use of oral hypoglycemic drugs: Secondary | ICD-10-CM | POA: Diagnosis not present

## 2017-04-15 DIAGNOSIS — Z87891 Personal history of nicotine dependence: Secondary | ICD-10-CM | POA: Diagnosis not present

## 2017-04-15 DIAGNOSIS — E119 Type 2 diabetes mellitus without complications: Secondary | ICD-10-CM | POA: Diagnosis not present

## 2017-04-15 DIAGNOSIS — K0889 Other specified disorders of teeth and supporting structures: Secondary | ICD-10-CM | POA: Insufficient documentation

## 2017-04-15 MED ORDER — CLINDAMYCIN HCL 300 MG PO CAPS: 300.0000 mg | ORAL_CAPSULE | Freq: Four times a day (QID) | ORAL | 0 refills | Status: DC

## 2017-04-15 MED ORDER — HYDROCODONE-ACETAMINOPHEN 5-325 MG PO TABS: 1.0000 | ORAL_TABLET | Freq: Four times a day (QID) | ORAL | 0 refills | Status: DC | PRN

## 2017-04-15 NOTE — ED Triage Notes (Signed)
Pt with dental pain since yesterday. Pain in left lower tooth. Has taken OTC meds no relief.

## 2017-04-15 NOTE — ED Provider Notes (Signed)
[facility] Provider Note   CSN: [id number] Arrival date & time: 04/15/17  1932  By signing my name below, I, Vanessa Reeves, attest that this documentation has been prepared under the direction and in the presence of Veryl Speak, MD. Electronically Signed: Jeanell Reeves, Scribe. 04/15/2017. 7:55 PM.  History   Chief Complaint Chief Complaint  Patient presents with  . Dental Pain   The history is provided by the patient. No language interpreter was used.  Dental Pain   This is a new problem. The current episode started yesterday. The problem occurs constantly. The problem has been gradually worsening. The pain is moderate. Treatments tried: ibuprofen. The treatment provided mild relief.   HPI Comments: Vanessa Reeves is a 46 y.o. female with a PMHx of DM who presents to the Emergency Department complaining of constant moderate left upper dental pain that started yesterday. She states her pain worsened today so she came to the ED. She took ibuprofen PTA with temporary relief. She currently has a Pharmacist, community. Denies any difficulty swallowing or other complaints at this time.  Past Medical History:  Diagnosis Date  . Anxiety   . Diabetes mellitus without complication ([place])   . Herpes   . Hypertension   . RLS (restless legs syndrome) 03/17/2017   Right leg    Patient Active Problem List   Diagnosis Date Noted  . RLS (restless legs syndrome) 03/17/2017  . 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 Reactive airway disease 08/21/2016  . Pain in right lower leg 04/06/2016  . Cough 02/25/2016  . GERD (gastroesophageal reflux disease) 12/20/2015  . Possible exposure to STD 06/07/2015  . Physical exam 12/14/2014  . Maxillary sinusitis, acute 12/14/2014  . Vaginitis and vulvovaginitis 03/30/2014  . Urinary frequency 03/30/2014  . Breast mass, left 02/16/2014  . Edema 02/16/2014  . Chest pain 12/29/2013  . Hypokalemia 08/28/2013  .  Diabetes ([place]) 08/28/2013  . Depression with anxiety 07/17/2013  . HTN (hypertension) 07/17/2013  . Eustachian tube dysfunction 07/17/2013  . Severe obesity (BMI >= 40) ([place]) 07/17/2013    Past Surgical History:  Procedure Laterality Date  . ABDOMINAL HYSTERECTOMY    . DILATION AND CURETTAGE OF UTERUS    . TUBAL LIGATION      OB History    Gravida Para Term Preterm AB Living   5 3 2 1 2      SAB TAB Ectopic Multiple Live Births   1 1             Home Medications    Prior to Admission medications   Medication Sig Start Date End Date Taking? Authorizing Provider  acetaminophen (TYLENOL) 325 MG tablet Take 650 mg by mouth every 6 (six) hours as needed.   Yes [provider]  albuterol (PROVENTIL HFA;VENTOLIN HFA) 108 (90 Base) MCG/ACT inhaler Inhale 2 puffs into the lungs every 6 (six) hours as needed for wheezing or shortness of breath. 02/25/16  Yes Midge Minium, MD  ALL DAY ALLERGY 10 MG tablet TAKE 1 TABLET BY MOUTH ONCE DAILY 11/17/16  Yes Midge Minium, MD  atenolol (TENORMIN) 50 MG tablet TAKE 2 TABLETS BY MOUTH AT BEDTIME. 12/30/16  Yes Midge Minium, MD  beclomethasone (QVAR) 80 MCG/ACT inhaler Inhale 1 puff into the lungs 2 (two) times daily. 08/21/16  Yes Midge Minium, MD  desonide (DESOWEN) 0.05 % cream APPLY TO THE AFFECTED AREA(S) TWICE DAILY 08/04/16  Yes Midge Minium, MD  escitalopram (  LEXAPRO) 10 MG tablet TAKE 1 TABLET BY MOUTH DAILY. Patient taking differently: TAKE 1 TABLET BY MOUTH DAILY., prn 02/08/17  Yes Midge Minium, MD  fluticasone (FLONASE) 50 MCG/ACT nasal spray PLACE 2 SPRAYS INTO BOTH NOSTRILS DAILY. Patient taking differently: Place 2 sprays into both nostrils as needed.  07/27/16  Yes Midge Minium, MD  furosemide (LASIX) 20 MG tablet TAKE 1 TABLET BY MOUTH ONCE DAILY 07/21/16  Yes Midge Minium, MD  KLOR-CON M20 20 MEQ tablet TAKE 2 TABLETS BY MOUTH DAILY 11/17/16  Yes Midge Minium, MD    losartan-hydrochlorothiazide (HYZAAR) 100-25 MG tablet TAKE 1 TABLET BY MOUTH ONCE DAILY 12/30/16  Yes Midge Minium, MD  metFORMIN (GLUCOPHAGE-XR) 500 MG 24 hr tablet TAKE 1 TABLET BY MOUTH ONCE DAILY WITH BREAKFAST 12/30/16  Yes Midge Minium, MD  mupirocin ointment (BACTROBAN) 2 % Apply 1 application topically 2 (two) times daily. 03/08/17  Yes Midge Minium, MD  ranitidine (ZANTAC) 300 MG tablet TAKE 1 TABLET BY MOUTH AT BEDTIME. 12/30/16  Yes Midge Minium, MD  SSS 10-5 10-5 % FOAM APPLY TO THE AFFECTED AREA ONCE DAILY 03/23/17  Yes Midge Minium, MD  tiZANidine (ZANAFLEX) 4 MG tablet Take 1 tablet (4 mg total) by mouth every 6 (six) hours as needed for muscle spasms. 12/09/16  Yes Hudnall, [name], MD  valACYclovir (VALTREX) 1000 MG tablet Take 1 tablet (1,000 mg total) by mouth 2 (two) times daily. Patient taking differently: Take 1,000 mg by mouth as needed.  07/09/16  Yes Midge Minium, MD  Vitamin D, Ergocalciferol, (DRISDOL) 50000 units CAPS capsule Take 1 capsule (50,000 Units total) by mouth every 7 (seven) days. 03/04/17  Yes Midge Minium, MD  clindamycin (CLEOCIN) 300 MG capsule Take 1 capsule (300 mg total) by mouth 4 (four) times daily. X 7 days 04/15/17   Veryl Speak, MD  HYDROcodone-acetaminophen (NORCO/VICODIN) 5-325 MG tablet Take 1-2 tablets by mouth every 6 (six) hours as needed. 04/15/17   Veryl Speak, MD    Family History Family History  Problem Relation Age of Onset  . Hypertension Mother   . Stroke Father   . Hypertension Paternal Grandmother   . Diabetes Mellitus II Paternal Grandmother   . Healthy Brother   . Healthy Daughter   . Healthy Son     Social History Social History  Substance Use Topics  . Smoking status: Former Research scientist (life sciences)  . Smokeless tobacco: Never Used  . Alcohol use 0.0 oz/week     Comment: socially     Allergies   Penicillins   Review of Systems Review of Systems  Constitutional: Negative for fever.   HENT: Positive for dental problem (Left upper). Negative for trouble swallowing.   All other systems reviewed and are negative.    Physical Exam Updated Vital Signs BP 136/83 (BP Location: Right Arm)   Pulse 80   Temp 98.9 F (37.2 C) (Oral)   Resp 18   Ht 5\' 4"  (1.626 m)   Wt (!) 301 lb (136.5 kg)   LMP 12/04/2011   SpO2 100%   BMI 51.67 kg/m   Physical Exam  Constitutional: She appears well-developed and well-nourished. No distress.  HENT:  Head: Normocephalic.  Left upper 1st molar appears decayed with mild gingival inflammation.   Eyes: Conjunctivae are normal.  Neck: Neck supple.  Cardiovascular: Normal rate and regular rhythm.   Pulmonary/Chest: Effort normal.  Abdominal: Soft.  Musculoskeletal: Normal range of motion.  Neurological:  She is alert.  Skin: Skin is warm and dry.  Psychiatric: She has a normal mood and affect.  Nursing note and vitals reviewed.    ED Treatments / Results  DIAGNOSTIC STUDIES: Oxygen Saturation is 100% on RA, normal by my interpretation.    COORDINATION OF CARE: 7:59 PM- Pt advised of plan for treatment and pt agrees.  Labs (all labs ordered are listed, but only abnormal results are displayed) Labs Reviewed - No data to display  EKG  EKG Interpretation None       Radiology No results found.  Procedures Procedures (including critical care time)  Medications Ordered in ED Medications - No data to display   Initial Impression / Assessment and Plan / ED Course  I have reviewed the triage vital signs and the nursing notes.  Pertinent labs & imaging results that were available during my care of the patient were reviewed by me and considered in my medical decision making (see chart for details).  Dental pain which will be treated with antibiotics, pain medicine, and follow-up with dentistry.  Final Clinical Impressions(s) / ED Diagnoses   Final diagnoses:  Pain, dental    New Prescriptions New Prescriptions    CLINDAMYCIN (CLEOCIN) 300 MG CAPSULE    Take 1 capsule (300 mg total) by mouth 4 (four) times daily. X 7 days   HYDROCODONE-ACETAMINOPHEN (NORCO/VICODIN) 5-325 MG TABLET    Take 1-2 tablets by mouth every 6 (six) hours as needed.    I personally performed the services described in this documentation, which was scribed in my presence. The recorded information has been reviewed and is accurate.        Veryl Speak, MD 04/15/17 2322

## 2017-04-15 NOTE — Discharge Instructions (Signed)
Clindamycin as prescribed.  Hydrocodone as prescribed as needed for pain.  Follow-up with your dentist in the next 3-4 days.

## 2017-04-16 MED FILL — LOSARTAN-HCTZ 100-25 MG TAB: 100-25 | 30 days supply | Qty: 30 | Fill #3

## 2017-04-28 ENCOUNTER — Encounter: Payer: Self-pay | Admitting: Family Medicine

## 2017-04-28 DIAGNOSIS — M79604 Pain in right leg: Secondary | ICD-10-CM

## 2017-04-29 MED ORDER — TIZANIDINE HCL 4 MG PO TABS: 4.0000 mg | ORAL_TABLET | Freq: Four times a day (QID) | ORAL | 1 refills | Status: DC | PRN

## 2017-04-29 MED FILL — SSS 10-5 FOAM: 10-5 | 30 days supply | Qty: 100 | Fill #1

## 2017-04-29 MED FILL — tiZANidine HCL 4 MG TABS: 4 | 10 days supply | Qty: 40 | Fill #0

## 2017-05-12 MED FILL — METFORMIN HCL ER 500 MG TAB: 500 | 30 days supply | Qty: 30 | Fill #4

## 2017-05-12 MED FILL — ESCITALOPRAM 10 MG TABLET: 10 | 30 days supply | Qty: 30 | Fill #1

## 2017-05-12 MED FILL — ATENOLOL 50 MG TABLET: 50 | 30 days supply | Qty: 60 | Fill #4

## 2017-05-12 MED FILL — FUROSEMIDE 20 MG TABLET: 20 | 30 days supply | Qty: 30 | Fill #3

## 2017-06-01 MED FILL — LOSARTAN-HCTZ 100-25 MG TAB: 100-25 | 30 days supply | Qty: 30 | Fill #4

## 2017-06-02 ENCOUNTER — Ambulatory Visit: Payer: 59 | Admitting: Family Medicine

## 2017-06-04 IMAGING — CR DG CHEST 2V
2 series · 2 of 2 positions shown · non-contrast
Comparison: September 11, 2014

CLINICAL DATA: Two-month history of shortness of breath

EXAM:
CHEST  2 VIEW

[w chest pa]
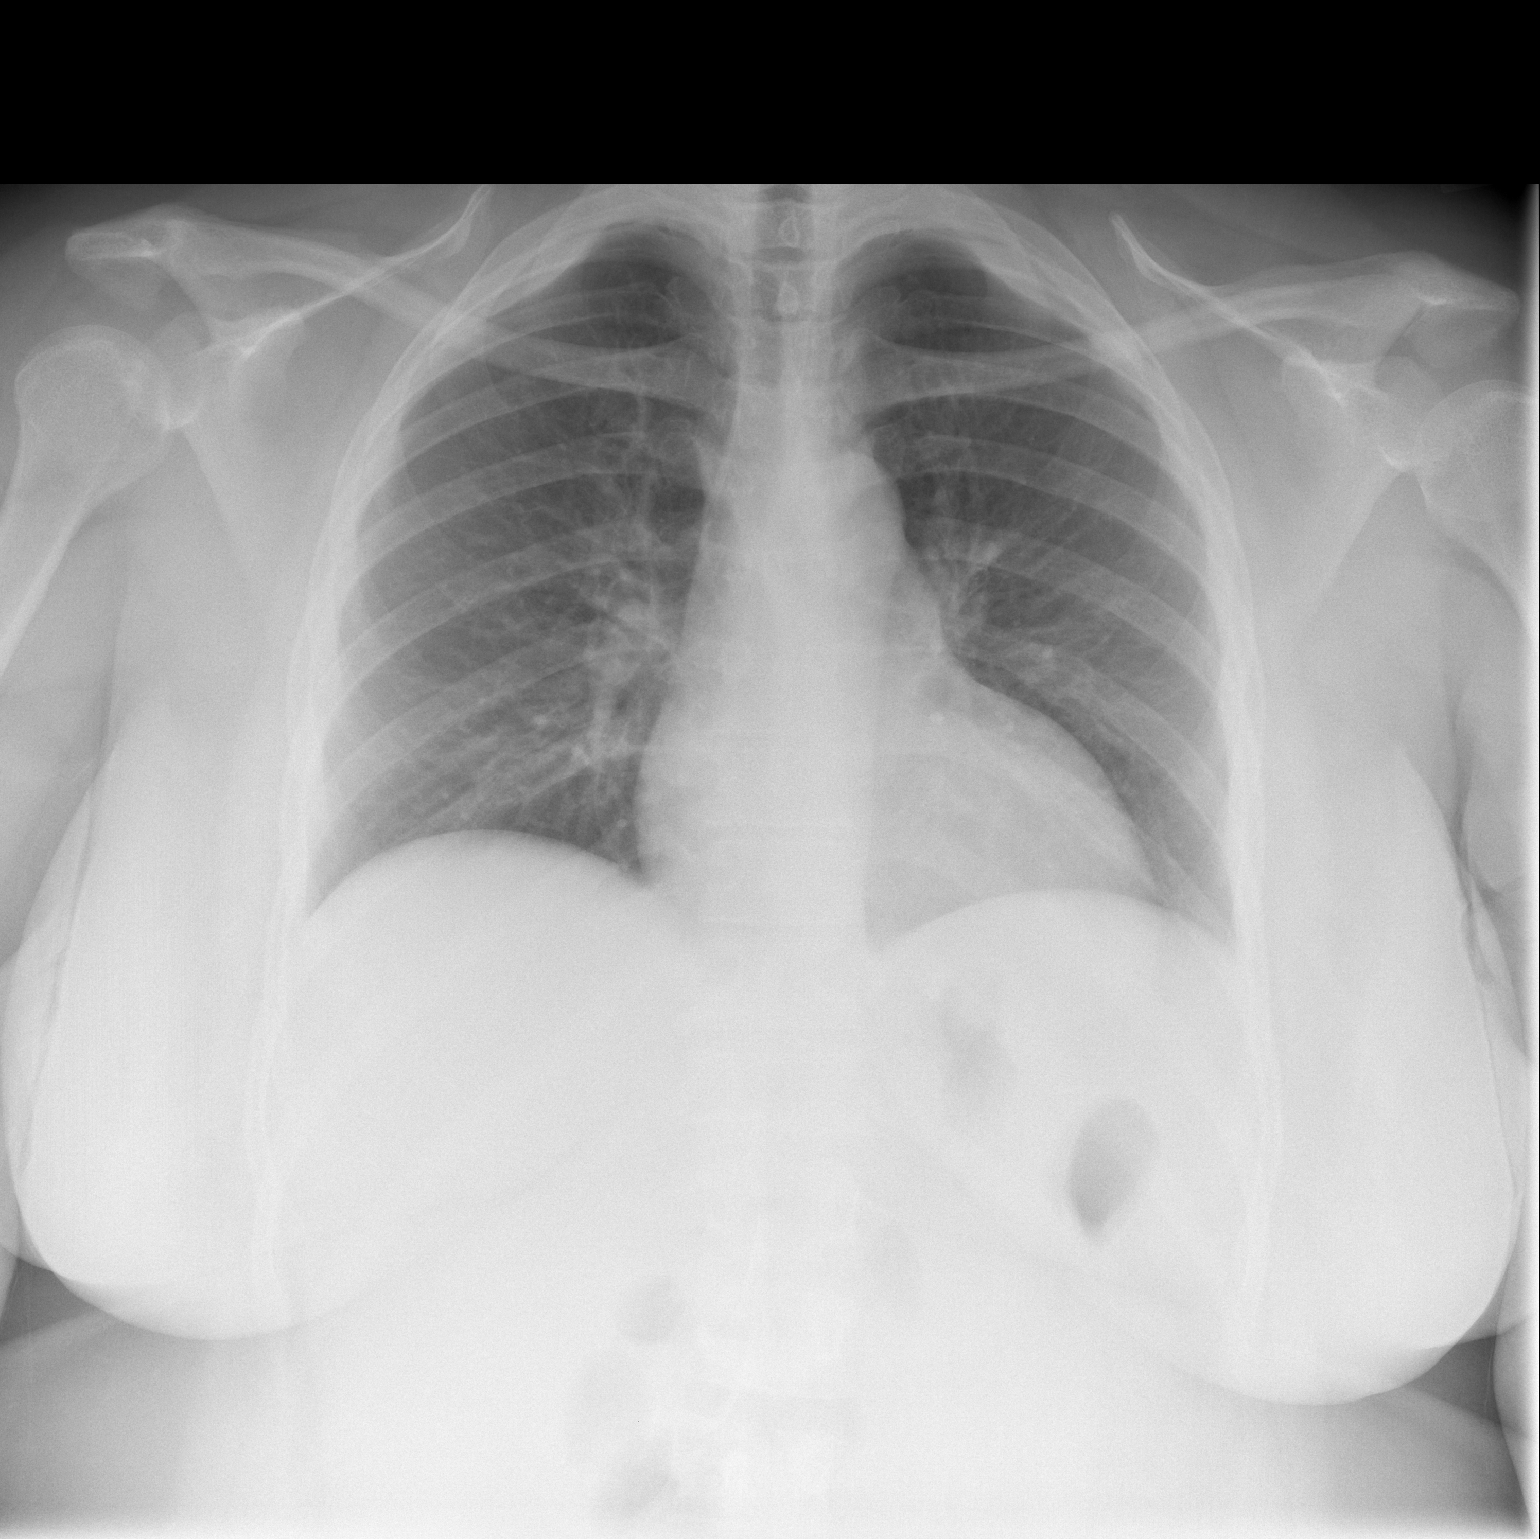

[w chest lat]
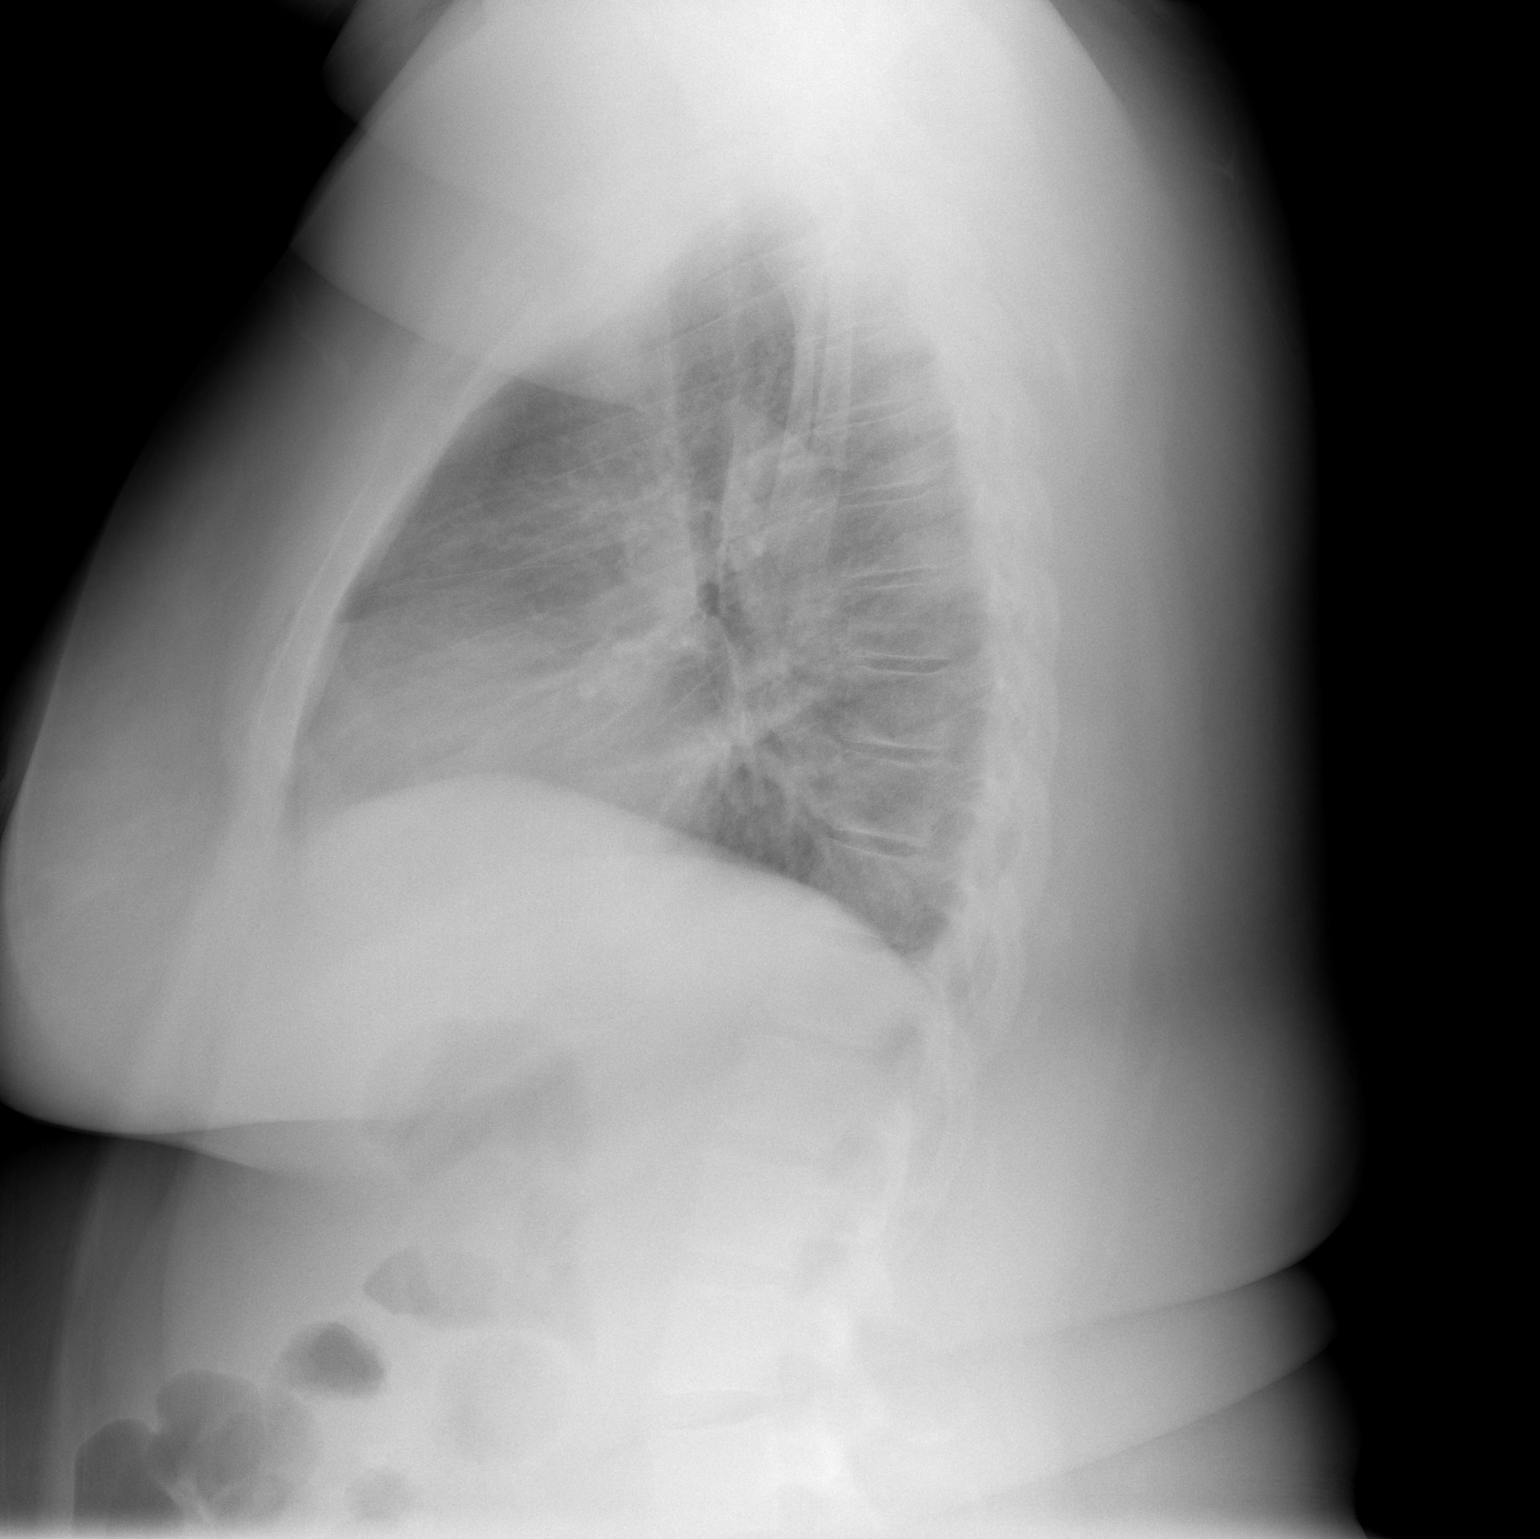

[2 of 2 positions shown; findings below may reference images not displayed]

FINDINGS: Lungs are clear. Heart size and pulmonary vascularity are normal. No
adenopathy. No pneumothorax. No bone lesions.
IMPRESSION: No edema or consolidation.

## 2017-06-07 MED FILL — FLUTICASONE PROP 50 MCG SPR: 50 | 30 days supply | Qty: 16 | Fill #2

## 2017-06-09 ENCOUNTER — Other Ambulatory Visit: Payer: Self-pay | Admitting: Family Medicine

## 2017-06-09 MED FILL — POTASSIUM CL ER 20 MEQ TAB: 20 | 30 days supply | Qty: 60 | Fill #3

## 2017-06-09 MED FILL — METFORMIN HCL ER 500 MG TAB: 500 | 30 days supply | Qty: 30 | Fill #5

## 2017-06-09 MED FILL — ESCITALOPRAM 10 MG TABLET: 10 | 30 days supply | Qty: 30 | Fill #2

## 2017-06-09 MED FILL — ATENOLOL 50 MG TABLET: 50 | 30 days supply | Qty: 60 | Fill #5

## 2017-06-09 MED FILL — SSS 10-5 FOAM: 10-5 | 30 days supply | Qty: 100 | Fill #0

## 2017-06-14 ENCOUNTER — Other Ambulatory Visit (HOSPITAL_COMMUNITY)
Admission: RE | Admit: 2017-06-14 | Discharge: 2017-06-14 | Disposition: A | Discharge status: Home / Self Care | Payer: 59 | Source: Ambulatory Visit | Attending: Family Medicine | Admitting: Family Medicine

## 2017-06-14 ENCOUNTER — Ambulatory Visit (INDEPENDENT_AMBULATORY_CARE_PROVIDER_SITE_OTHER): Payer: 59 | Admitting: Family Medicine

## 2017-06-14 ENCOUNTER — Encounter: Payer: Self-pay | Admitting: Family Medicine

## 2017-06-14 VITALS — BP 130/80 | HR 76 | Resp 16 | Ht 64.0 in | Wt 301.4 lb

## 2017-06-14 DIAGNOSIS — N760 Acute vaginitis: Secondary | ICD-10-CM

## 2017-06-14 DIAGNOSIS — R232 Flushing: Secondary | ICD-10-CM | POA: Diagnosis not present

## 2017-06-14 DIAGNOSIS — E119 Type 2 diabetes mellitus without complications: Secondary | ICD-10-CM

## 2017-06-14 DIAGNOSIS — M79604 Pain in right leg: Secondary | ICD-10-CM

## 2017-06-14 LAB — BASIC METABOLIC PANEL
BUN: 12 mg/dL (ref 6–23)
CHLORIDE: 101 meq/L (ref 96–112)
CO2: 32 mEq/L (ref 19–32)
CREATININE: 0.85 mg/dL (ref 0.40–1.20)
Calcium: 9.6 mg/dL (ref 8.4–10.5)
GFR: 93.16 mL/min (ref >60.00)
Glucose, Bld: 126 mg/dL — ABNORMAL HIGH (ref 70–99)
Potassium: 4.4 mEq/L (ref 3.5–5.1)
Sodium: 143 mEq/L (ref 135–145)

## 2017-06-14 LAB — HEMOGLOBIN A1C: Hgb A1c MFr Bld: 6.3 % (ref 4.6–6.5)

## 2017-06-14 LAB — LUTEINIZING HORMONE: LH: 78.99 m[IU]/mL

## 2017-06-14 LAB — FOLLICLE STIMULATING HORMONE: FSH: 49.7 m[IU]/mL

## 2017-06-14 MED ORDER — TIZANIDINE HCL 4 MG PO TABS: 4.0000 mg | ORAL_TABLET | Freq: Four times a day (QID) | ORAL | 1 refills | Status: DC | PRN

## 2017-06-14 MED ORDER — ACETAMINOPHEN 325 MG PO TABS: 650.0000 mg | ORAL_TABLET | Freq: Four times a day (QID) | ORAL | 3 refills | Status: DC | PRN

## 2017-06-14 MED ORDER — ATENOLOL 50 MG PO TABS: 100.0000 mg | ORAL_TABLET | Freq: Every day | ORAL | 1 refills | Status: DC

## 2017-06-14 MED ORDER — LOSARTAN POTASSIUM-HCTZ 100-25 MG PO TABS: 1.0000 | ORAL_TABLET | Freq: Every day | ORAL | 1 refills | Status: DC

## 2017-06-14 NOTE — Progress Notes (Signed)
Pre visit review using our clinic review tool, if applicable. No additional management support is needed unless otherwise documented below in the visit note. 

## 2017-06-14 NOTE — Assessment & Plan Note (Signed)
Chronic problem.  Tolerating Metformin w/o difficulty.  UTD on foot exam, eye exam.  On ARB for renal protection.  Check labs.  Adjust meds prn

## 2017-06-14 NOTE — Assessment & Plan Note (Signed)
Pt has hx of recurrent BV but with her recent abx use and steroid injxn, will check for possible yeast.  Depending on urine cytology will tx.  Pt expressed understanding and is in agreement w/ plan.

## 2017-06-14 NOTE — Progress Notes (Signed)
   Subjective:    Patient ID: Vanessa Reeves, female    DOB: 22-Apr-1971, 46 y.o.   MRN: [id number]  HPI DM- chronic problem.  On Metformin 500mg  XR once daily.  On ARB for renal protection.  UTD on eye exam, foot exam.  Rare symptomatic lows.  No CP, SOB, HAs, visual changes, edema.  No numbness/tingling of hands/feet.  BV- pt reports the odor has returned.  Has hx of recurrent infxns.  Has been on abx recently and steroid injxn.  Pt reports 'it's uncomfortable'.  Hot flashes- pt has ovaries remaining from hysterectomy 2013.  Pt reports that she is freezing out the rest of the family   Review of Systems For ROS see HPI     Objective:   Physical Exam  Constitutional: She is oriented to person, place, and time. She appears well-developed and well-nourished. No distress.  obese  HENT:  Head: Normocephalic and atraumatic.  Eyes: Pupils are equal, round, and reactive to light. Conjunctivae and EOM are normal.  Neck: Normal range of motion. Neck supple. No thyromegaly present.  Cardiovascular: Normal rate, regular rhythm, normal heart sounds and intact distal pulses.   No murmur heard. Pulmonary/Chest: Effort normal and breath sounds normal. No respiratory distress.  Abdominal: Soft. She exhibits no distension. There is no tenderness.  Genitourinary:  Genitourinary Comments: Pt defers  Musculoskeletal: She exhibits no edema.  Lymphadenopathy:    She has no cervical adenopathy.  Neurological: She is alert and oriented to person, place, and time.  Skin: Skin is warm and dry.  Psychiatric: She has a normal mood and affect. Her behavior is normal.  Vitals reviewed.         Assessment & Plan:  Hot flashes- new to provider ongoing for pt.  Check hormone levels as pt had hysterectomy in 2013.  Start [employer].  Pt expressed understanding and is in agreement w/ plan.

## 2017-06-14 NOTE — Patient Instructions (Signed)
Follow up in 3-4 months to recheck diabetes, BP, cholesterol We'll notify you of your lab results and make any changes if needed Add OTC Black Cohosh for the hot flashes Continue to work on healthy diet and regular exercise- you can do it!! Depending on what the urine shows, we'll call in the appropriate treatment Call with any questions or concerns Enjoy the rest of your summer!!!

## 2017-06-17 ENCOUNTER — Encounter: Payer: Self-pay | Admitting: Family Medicine

## 2017-06-18 LAB — URINE CYTOLOGY ANCILLARY ONLY
BACTERIAL VAGINITIS: NEGATIVE
CANDIDA VAGINITIS: NEGATIVE

## 2017-07-01 ENCOUNTER — Encounter: Payer: Self-pay | Admitting: Family Medicine

## 2017-07-01 ENCOUNTER — Encounter: Payer: Self-pay | Admitting: Neurology

## 2017-07-01 ENCOUNTER — Other Ambulatory Visit: Payer: Self-pay | Admitting: *Deleted

## 2017-07-01 MED ORDER — METHYLPREDNISOLONE 4 MG PO TBPK: ORAL_TABLET | ORAL | 0 refills | Status: DC

## 2017-07-01 NOTE — Telephone Encounter (Signed)
Called [employer] [facility]. Gave VO per Dr Felecia Shelling for rx medrol dosepak. Qty 21, 0 refills. Directions: Take 6 tablets first day, 5 tablets second day, 4 tablets third day, 3 tablets fourth day, 2 tablets fifth day, and 1 tablet on the last day. Gave [facility] phone number if there are any other questions.   Pt called and informed rx called in.

## 2017-07-02 ENCOUNTER — Encounter: Payer: Self-pay | Admitting: Family Medicine

## 2017-07-02 MED ORDER — ACCU-CHEK FASTCLIX LANCETS MISC: 12 refills | Status: DC

## 2017-07-02 MED ORDER — GLUCOSE BLOOD VI STRP: ORAL_STRIP | 12 refills | Status: DC

## 2017-07-02 MED FILL — tiZANidine HCL 4 MG TABS: 4 | 10 days supply | Qty: 40 | Fill #0

## 2017-07-02 MED FILL — ACCU-CHEK FASTCLIX LANCETS: 51 days supply | Qty: 102 | Fill #0

## 2017-07-02 MED FILL — METHYLPREDNISOLONE 4 MG TAB: 4 | 6 days supply | Qty: 21 | Fill #0

## 2017-07-02 MED FILL — ACCU-CHEK GUIDE TEST STRIP: 50 days supply | Qty: 100 | Fill #0

## 2017-07-02 MED FILL — LOSARTAN-HCTZ 100-25 MG TAB: 100-25 | 30 days supply | Qty: 30 | Fill #5

## 2017-07-06 ENCOUNTER — Ambulatory Visit (INDEPENDENT_AMBULATORY_CARE_PROVIDER_SITE_OTHER): Payer: 59

## 2017-07-06 DIAGNOSIS — Z23 Encounter for immunization: Secondary | ICD-10-CM

## 2017-07-09 ENCOUNTER — Encounter: Payer: Self-pay | Admitting: Family Medicine

## 2017-07-09 ENCOUNTER — Other Ambulatory Visit: Payer: Self-pay | Admitting: Family Medicine

## 2017-07-09 MED FILL — ESCITALOPRAM 10 MG TABLET: 10 | 30 days supply | Qty: 30 | Fill #3

## 2017-07-09 MED FILL — ATENOLOL 50 MG TABLET: 50 | 90 days supply | Qty: 180 | Fill #0

## 2017-07-09 MED FILL — METFORMIN HCL ER 500 MG TAB: 500 | 90 days supply | Qty: 90 | Fill #0

## 2017-07-13 MED ORDER — ESCITALOPRAM OXALATE 20 MG PO TABS: 20.0000 mg | ORAL_TABLET | Freq: Every day | ORAL | 3 refills | Status: DC

## 2017-07-15 ENCOUNTER — Telehealth: Payer: Self-pay | Admitting: Family Medicine

## 2017-07-15 NOTE — Telephone Encounter (Signed)
Pt states that she just received an auto call stating that she was due for her pap, pt states that she has had a hysterectomy and did not think she got these anymore, please advise.

## 2017-07-15 NOTE — Telephone Encounter (Signed)
I forwarded an email to office manager to take care of this.

## 2017-07-30 MED FILL — ESCITALOPRAM 20 MG TABLET: 20 | 30 days supply | Qty: 30 | Fill #0

## 2017-07-30 MED FILL — LOSARTAN-HCTZ 100-25 MG TAB: 100-25 | 90 days supply | Qty: 90 | Fill #0

## 2017-08-03 ENCOUNTER — Telehealth: Payer: Self-pay | Admitting: Family Medicine

## 2017-08-03 NOTE — Telephone Encounter (Signed)
Pt called back to state that FMLA paperwork is about to expire for her anxiety and needs this updated. Place in bin upfront with charge sheet.

## 2017-08-03 NOTE — Telephone Encounter (Signed)
FYI, Received FMLA paperwork by fax, called pt and LM regarding what this was needed for and asked for a CB. Forms have been placed in file cabinet awaiting pt to return call.

## 2017-08-05 ENCOUNTER — Other Ambulatory Visit: Payer: Self-pay | Admitting: Family Medicine

## 2017-08-05 MED FILL — DESONIDE 0.05% CREAM: 0.05 | 20 days supply | Qty: 60 | Fill #0

## 2017-08-05 MED FILL — SSS 10-5 FOAM: 10-5 | 30 days supply | Qty: 100 | Fill #1

## 2017-08-06 ENCOUNTER — Encounter: Payer: Self-pay | Admitting: Family Medicine

## 2017-08-10 ENCOUNTER — Encounter: Payer: Self-pay | Admitting: Physician Assistant

## 2017-08-10 ENCOUNTER — Ambulatory Visit (INDEPENDENT_AMBULATORY_CARE_PROVIDER_SITE_OTHER): Payer: 59 | Admitting: Physician Assistant

## 2017-08-10 DIAGNOSIS — F418 Other specified anxiety disorders: Secondary | ICD-10-CM | POA: Diagnosis not present

## 2017-08-10 NOTE — Assessment & Plan Note (Signed)
Is tolerating increased dose of Lexapro. Needs to stay on longer to see full effect. Will continue same dose. Testing accommodation written. Counseling recommended -- handout given. Patient encouraged to schedule with Dr. Lurline Hare for formal adult ADD testing. Recommended she download the Headspace app to help with meditations.

## 2017-08-10 NOTE — Patient Instructions (Signed)
Please continue the Lexapro as directed. Use the handout given to schedule an appointment with counseling.  This will be very beneficial.  Download the HeadSpace app and try this out.  I am writing you a letter for testing accommodations. Follow-up with [place] in 4 weeks.

## 2017-08-10 NOTE — Progress Notes (Signed)
Pre visit review using our clinic review tool, if applicable. No additional management support is needed unless otherwise documented below in the visit note. 

## 2017-08-10 NOTE — Progress Notes (Signed)
Patient presents to clinic today to discuss anxiety and affect on test taking. Recently had Lexapro increased to 20 mg. Is taking as directed and tolerating well. Has noted some improvement so far. States she deals with generalized anxiety on a regular basis that is exacerbated at test time at school. 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 Is having significant anxiety with tests and not feeling able to complete in time due to anxiety/imparied focus. States she otherwise dose fine in lecture. Denies depressed mood, SI/HI.[name]   Past Medical History:  Diagnosis Date  . Anxiety   . Diabetes mellitus without complication ([place])   . Herpes   . Hypertension   . RLS (restless legs syndrome) 03/17/2017   Right leg    Current Outpatient Prescriptions on File Prior to Visit  Medication Sig Dispense Refill  . ACCU-CHEK FASTCLIX LANCETS MISC Use one lancet each time sugars are checked. Pt tests twice daily. 102 each 12  . acetaminophen (TYLENOL) 325 MG tablet Take 2 tablets (650 mg total) by mouth every 6 (six) hours as needed. 90 tablet 3  . albuterol (PROVENTIL HFA;VENTOLIN HFA) 108 (90 Base) MCG/ACT inhaler Inhale 2 puffs into the lungs every 6 (six) hours as needed for wheezing or shortness of breath. 1 Inhaler 2  . ALL DAY ALLERGY 10 MG tablet TAKE 1 TABLET BY MOUTH ONCE DAILY 30 tablet 11  . atenolol (TENORMIN) 50 MG tablet Take 2 tablets (100 mg total) by mouth at bedtime. 180 tablet 1  . beclomethasone (QVAR) 80 MCG/ACT inhaler Inhale 1 puff into the lungs 2 (two) times daily. 1 Inhaler 12  . desonide (DESOWEN) 0.05 % cream APPLY TO THE AFFECTED AREA(S) TWICE DAILY 60 g 3  . escitalopram (LEXAPRO) 20 MG tablet Take 1 tablet (20 mg total) by mouth daily. 30 tablet 3  . fluticasone (FLONASE) 50 MCG/ACT nasal spray PLACE 2 SPRAYS INTO BOTH NOSTRILS DAILY. (Patient taking differently: Place 2 sprays into both nostrils as needed. ) 16 g 6  . furosemide (LASIX) 20 MG tablet TAKE 1 TABLET BY  MOUTH ONCE DAILY 30 tablet 3  . glucose blood test strip Use as instructed to test your sugars twice daily. Dx. E11.9. Pt has an accu-chek glucometer. 100 each 12  . KLOR-CON M20 20 MEQ tablet TAKE 2 TABLETS BY MOUTH DAILY 60 tablet 6  . losartan-hydrochlorothiazide (HYZAAR) 100-25 MG tablet Take 1 tablet by mouth daily. 90 tablet 1  . metFORMIN (GLUCOPHAGE-XR) 500 MG 24 hr tablet TAKE 1 TABLET BY MOUTH ONCE DAILY WITH BREAKFAST 90 tablet 1  . ranitidine (ZANTAC) 300 MG tablet TAKE 1 TABLET BY MOUTH AT BEDTIME. 30 tablet 6  . SSS 10-5 10-5 % FOAM APPLY TO THE AFFECTED AREA ONCE DAILY 100 g 1  . tiZANidine (ZANAFLEX) 4 MG tablet Take 1 tablet (4 mg total) by mouth every 6 (six) hours as needed for muscle spasms. 40 tablet 1  . valACYclovir (VALTREX) 1000 MG tablet Take 1 tablet (1,000 mg total) by mouth 2 (two) times daily. (Patient taking differently: Take 1,000 mg by mouth as needed. ) 20 tablet 3   No current facility-administered medications on file prior to visit.     Allergies  Allergen Reactions  . Penicillins Other (See Comments)    Childhood allergy.    Family History  Problem Relation Age of Onset  . Hypertension Mother   . Stroke Father   . Hypertension Paternal Grandmother   . Diabetes Mellitus II Paternal Grandmother   .  Healthy Brother   . Healthy Daughter   . Healthy Son     Social History   Social History  . Marital status: Married    Spouse name: N/A  . Number of children: N/A  . Years of education: N/A   Social History Main Topics  . Smoking status: Former Research scientist (life sciences)  . Smokeless tobacco: Never Used  . Alcohol use 0.0 oz/week     Comment: socially  . Drug use: No  . Sexual activity: Yes    Birth control/ protection: Surgical   Other Topics Concern  . None   Social History Narrative   Lives with husband and 2 children in a 2 story home.     Works as a Chartered certified accountant at [employer].     Highest level of education:  High school, in college now   Review of  Systems - See HPI.  All other ROS are negative.  BP 112/72   Pulse 76   Temp 99 F (37.2 C) (Oral)   Resp 14   Ht 5\' 4"  (1.626 m)   Wt (!) 308 lb (139.7 kg)   LMP 12/04/2011   SpO2 99%   BMI 52.87 kg/m   Physical Exam  Constitutional: She is oriented to person, place, and time and well-developed, well-nourished, and in no distress.  HENT:  Head: Normocephalic and atraumatic.  Eyes: Conjunctivae are normal.  Neck: Neck supple.  Cardiovascular: Normal rate, regular rhythm, normal heart sounds and intact distal pulses.   Pulmonary/Chest: Effort normal and breath sounds normal. No respiratory distress. She has no wheezes. She has no rales. She exhibits no tenderness.  Neurological: She is alert and oriented to person, place, and time.  Skin: Skin is warm and dry. No rash noted.  Vitals reviewed.   Recent Results (from the past 2160 hour(s))  Urine cytology ancillary only     Status: None   Collection Time: 06/14/17 12:00 AM  Result Value Ref Range   Bacterial vaginitis Negative for Bacterial Vaginitis Microorganisms     Comment: Normal Reference Range - Negative   Candida vaginitis Negative for Candida Vaginitis Microorganisms     Comment: Normal Reference Range - Negative  Hemoglobin A1c     Status: None   Collection Time: 06/14/17  9:57 AM  Result Value Ref Range   Hgb A1c MFr Bld 6.3 4.6 - 6.5 %    Comment: Glycemic Control Guidelines for People with Diabetes:Non Diabetic:  <6%Goal of Therapy: <7%Additional Action Suggested:  >5%   Basic metabolic panel     Status: Abnormal   Collection Time: 06/14/17  9:57 AM  Result Value Ref Range   Sodium 143 135 - 145 mEq/L   Potassium 4.4 3.5 - 5.1 mEq/L   Chloride 101 96 - 112 mEq/L   CO2 32 19 - 32 mEq/L   Glucose, Bld 126 (H) 70 - 99 mg/dL   BUN 12 6 - 23 mg/dL   Creatinine, Ser 0.85 0.40 - 1.20 mg/dL   Calcium 9.6 8.4 - 10.5 mg/dL   GFR 93.16 >60.00 mL/min  FSH     Status: None   Collection Time: 06/14/17  9:57 AM    Result Value Ref Range   FSH 49.7 mIU/ML    Comment: Female Reference Range:  1.4-18.1 mIU/mLFemale Reference Range:Follicular Phase          2.5-10.2 mIU/mLMidCycle Peak          3.4-33.4 mIU/mLLuteal Phase  1.5-9.1 mIU/mLPost Menopausal     23.0-116.3 mIU/mLPregnant          <0.3 mIU/mL  LH     Status: None   Collection Time: 06/14/17  9:57 AM  Result Value Ref Range   LH 78.99 mIU/mL    Comment: Female Reference Range:20-70 yrs     1.5-9.3 mIU/mL>70 yrs       3.1-35.6 mIU/mLFemale Reference Range:Follicular Phase     4.2-35.3 mIU/mLMidcycle             8.7-76.3 mIU/mLLuteal Phase         0.5-16.9 mIU/mL  Post Menopausal      15.9-54.0  mIU/mLPregnant             <1.5 mIU/mLContraceptives       0.7-5.6 mIU/mL     Assessment/Plan: Depression with anxiety Is tolerating increased dose of Lexapro. Needs to stay on longer to see full effect. Will continue same dose. Testing accommodation written. Counseling recommended -- handout given. Patient encouraged to schedule with Dr. Lurline Hare for formal adult ADD testing. Recommended she download the Headspace app to help with meditations.    Leeanne Rio, PA-C

## 2017-08-11 ENCOUNTER — Telehealth: Payer: Self-pay | Admitting: Family Medicine

## 2017-08-11 NOTE — Telephone Encounter (Signed)
Received FMLA paperwork by fax, gave to [facility] with charge sheet since he saw pt on 10/9.

## 2017-08-11 NOTE — Telephone Encounter (Signed)
PCP already has forms and is working on them.

## 2017-08-11 NOTE — Telephone Encounter (Signed)
Forms completed and placed in basket 

## 2017-08-12 NOTE — Telephone Encounter (Signed)
Forms have been faxed 

## 2017-08-25 MED FILL — CLINDAMYCIN HCL 300 MG CAPS: 300 | 7 days supply | Qty: 29 | Fill #0

## 2017-08-25 MED FILL — traMADol HCL 50 MG TABS: 50 | 3 days supply | Qty: 18 | Fill #0

## 2017-09-02 ENCOUNTER — Other Ambulatory Visit: Payer: Self-pay | Admitting: Family Medicine

## 2017-09-02 MED FILL — FUROSEMIDE 20 MG TABLET: 20 | 30 days supply | Qty: 30 | Fill #0

## 2017-09-02 MED FILL — ESCITALOPRAM 20 MG TABLET: 20 | 30 days supply | Qty: 30 | Fill #1

## 2017-09-02 MED FILL — POTASSIUM CL ER 20 MEQ TAB: 20 | 30 days supply | Qty: 60 | Fill #4

## 2017-09-09 ENCOUNTER — Ambulatory Visit: Payer: 59 | Admitting: Physician Assistant

## 2017-09-09 DIAGNOSIS — Z0289 Encounter for other administrative examinations: Secondary | ICD-10-CM

## 2017-09-14 MED FILL — ATENOLOL 50 MG TABLET: 50 | 90 days supply | Qty: 180 | Fill #1

## 2017-09-30 ENCOUNTER — Ambulatory Visit: Payer: 59 | Admitting: Neurology

## 2017-10-04 ENCOUNTER — Other Ambulatory Visit: Payer: Self-pay | Admitting: Family Medicine

## 2017-10-04 MED FILL — LOSARTAN-HCTZ 100-25 MG TAB: 100-25 | 90 days supply | Qty: 90 | Fill #1

## 2017-10-05 MED FILL — SSS 10-5 FOAM: 10-5 | 30 days supply | Qty: 100 | Fill #0

## 2017-10-07 MED FILL — ESCITALOPRAM 20 MG TABLET: 20 | 30 days supply | Qty: 30 | Fill #2

## 2017-10-07 MED FILL — ACCU-CHEK GUIDE TEST STRIP: 50 days supply | Qty: 100 | Fill #1

## 2017-10-07 MED FILL — ACCU-CHEK FASTCLIX LANCETS: 51 days supply | Qty: 102 | Fill #1

## 2017-10-07 MED FILL — METFORMIN HCL ER 500 MG TAB: 500 | 90 days supply | Qty: 90 | Fill #1

## 2017-11-05 MED FILL — FUROSEMIDE 20 MG TABS: 20 | 30 days supply | Qty: 30 | Fill #1

## 2017-11-05 MED FILL — POTASSIUM CL ER 20 MEQ TAB: 20 | 30 days supply | Qty: 60 | Fill #5

## 2017-11-16 MED FILL — ESCITALOPRAM 20 MG TABLET: 20 | 30 days supply | Qty: 30 | Fill #3

## 2017-11-19 ENCOUNTER — Ambulatory Visit: Payer: 59 | Admitting: Physician Assistant

## 2017-11-19 ENCOUNTER — Other Ambulatory Visit: Payer: Self-pay

## 2017-11-19 ENCOUNTER — Encounter: Payer: Self-pay | Admitting: Family Medicine

## 2017-11-19 VITALS — BP 116/82 | HR 59 | Temp 98.2°F | Resp 16 | Ht 64.0 in | Wt 313.5 lb

## 2017-11-19 DIAGNOSIS — E119 Type 2 diabetes mellitus without complications: Secondary | ICD-10-CM | POA: Diagnosis not present

## 2017-11-19 DIAGNOSIS — I10 Essential (primary) hypertension: Secondary | ICD-10-CM

## 2017-11-19 DIAGNOSIS — K219 Gastro-esophageal reflux disease without esophagitis: Secondary | ICD-10-CM

## 2017-11-19 DIAGNOSIS — R413 Other amnesia: Secondary | ICD-10-CM | POA: Diagnosis not present

## 2017-11-19 DIAGNOSIS — F418 Other specified anxiety disorders: Secondary | ICD-10-CM

## 2017-11-19 LAB — CBC WITH DIFFERENTIAL/PLATELET
BASOS ABS: 0 10*3/uL (ref 0.0–0.1)
Basophils Relative: 0.5 % (ref 0.0–3.0)
EOS PCT: 2.1 % (ref 0.0–5.0)
Eosinophils Absolute: 0.1 10*3/uL (ref 0.0–0.7)
HEMATOCRIT: 35.9 % — AB (ref 36.0–46.0)
HEMOGLOBIN: 11.7 g/dL — AB (ref 12.0–15.0)
LYMPHS ABS: 3.3 10*3/uL (ref 0.7–4.0)
LYMPHS PCT: 49.5 % — AB (ref 12.0–46.0)
MCHC: 32.5 g/dL (ref 30.0–36.0)
MCV: 85.7 fl (ref 78.0–100.0)
MONOS PCT: 7.3 % (ref 3.0–12.0)
Monocytes Absolute: 0.5 10*3/uL (ref 0.1–1.0)
NEUTROS PCT: 40.6 % — AB (ref 43.0–77.0)
Neutro Abs: 2.7 10*3/uL (ref 1.4–7.7)
Platelets: 427 10*3/uL — ABNORMAL HIGH (ref 150.0–400.0)
RBC: 4.19 Mil/uL (ref 3.87–5.11)
RDW: 15.8 % — ABNORMAL HIGH (ref 11.5–15.5)
WBC: 6.6 10*3/uL (ref 4.0–10.5)

## 2017-11-19 LAB — LIPID PANEL
CHOL/HDL RATIO: 3
CHOLESTEROL: 148 mg/dL (ref 0–200)
HDL: 48.3 mg/dL (ref >39.00)
LDL CALC: 85 mg/dL (ref 0–99)
NonHDL: 100.08
TRIGLYCERIDES: 75 mg/dL (ref 0.0–149.0)
VLDL: 15 mg/dL (ref 0.0–40.0)

## 2017-11-19 LAB — HEPATIC FUNCTION PANEL
ALBUMIN: 3.8 g/dL (ref 3.5–5.2)
ALT: 9 U/L (ref 0–35)
AST: 11 U/L (ref 0–37)
Alkaline Phosphatase: 68 U/L (ref 39–117)
BILIRUBIN TOTAL: 0.3 mg/dL (ref 0.2–1.2)
Bilirubin, Direct: 0.1 mg/dL (ref 0.0–0.3)
Total Protein: 7.3 g/dL (ref 6.0–8.3)

## 2017-11-19 LAB — BASIC METABOLIC PANEL
BUN: 14 mg/dL (ref 6–23)
CALCIUM: 9.4 mg/dL (ref 8.4–10.5)
CO2: 32 mEq/L (ref 19–32)
Chloride: 99 mEq/L (ref 96–112)
Creatinine, Ser: 0.85 mg/dL (ref 0.40–1.20)
GFR: 92.98 mL/min (ref >60.00)
GLUCOSE: 103 mg/dL — AB (ref 70–99)
Potassium: 3.8 mEq/L (ref 3.5–5.1)
Sodium: 138 mEq/L (ref 135–145)

## 2017-11-19 LAB — TSH: TSH: 2.1 u[IU]/mL (ref 0.35–4.50)

## 2017-11-19 LAB — HEMOGLOBIN A1C: Hgb A1c MFr Bld: 6.7 % — ABNORMAL HIGH (ref 4.6–6.5)

## 2017-11-19 MED ORDER — PANTOPRAZOLE SODIUM 40 MG PO TBEC: 40.0000 mg | DELAYED_RELEASE_TABLET | Freq: Every day | ORAL | 3 refills | Status: DC

## 2017-11-19 MED FILL — PANTOPRAZOLE SOD DR 40 MG T: 40 | 30 days supply | Qty: 30 | Fill #0

## 2017-11-19 NOTE — Patient Instructions (Signed)
Please go to the lab today for blood work.  I will call you with your results. We will alter treatment regimen(s) if indicated by your results.   Please start the Protonix once daily over the next two weeks. I believe reflux is contributing to cough presently.  You will be contacted for assessment by gynecology.  If labs are unremarkable, I would recommend a trial of changing Lexapro to something else to see if it is contributing to memory.    Diabetes Mellitus and Exercise Exercising regularly is important for your overall health, especially when you have diabetes (diabetes mellitus). Exercising is not only about losing weight. It has many health benefits, such as increasing muscle strength and bone density and reducing body fat and stress. This leads to improved fitness, flexibility, and endurance, all of which result in better overall health. Exercise has additional benefits for people with diabetes, including:  Reducing appetite.  Helping to lower and control blood glucose.  Lowering blood pressure.  Helping to control amounts of fatty substances (lipids) in the blood, such as cholesterol and triglycerides.  Helping the body to respond better to insulin (improving insulin sensitivity).  Reducing how much insulin the body needs.  Decreasing the risk for heart disease by: ? Lowering cholesterol and triglyceride levels. ? Increasing the levels of good cholesterol. ? Lowering blood glucose levels.  What is my activity plan? Your health care provider or certified diabetes educator can help you make a plan for the type and frequency of exercise (activity plan) that works for you. Make sure that you:  Do at least 150 minutes of moderate-intensity or vigorous-intensity exercise each week. This could be brisk walking, biking, or water aerobics. ? Do stretching and strength exercises, such as yoga or weightlifting, at least 2 times a week. ? Spread out your activity over at least 3  days of the week.  Get some form of physical activity every day. ? Do not go more than 2 days in a row without some kind of physical activity. ? Avoid being inactive for more than 90 minutes at a time. Take frequent breaks to walk or stretch.  Choose a type of exercise or activity that you enjoy, and set realistic goals.  Start slowly, and gradually increase the intensity of your exercise over time.  What do I need to know about managing my diabetes?  Check your blood glucose before and after exercising. ? If your blood glucose is higher than 240 mg/dL (13.3 mmol/L) before you exercise, check your urine for ketones. If you have ketones in your urine, do not exercise until your blood glucose returns to normal.  Know the symptoms of low blood glucose (hypoglycemia) and how to treat it. Your risk for hypoglycemia increases during and after exercise. Common symptoms of hypoglycemia can include: ? Hunger. ? Anxiety. ? Sweating and feeling clammy. ? Confusion. ? Dizziness or feeling light-headed. ? Increased heart rate or palpitations. ? Blurry vision. ? Tingling or numbness around the mouth, lips, or tongue. ? Tremors or shakes. ? Irritability.  Keep a rapid-acting carbohydrate snack available before, during, and after exercise to help prevent or treat hypoglycemia.  Avoid injecting insulin into areas of the body that are going to be exercised. For example, avoid injecting insulin into: ? The arms, when playing tennis. ? The legs, when jogging.  Keep records of your exercise habits. Doing this can help you and your health care provider adjust your diabetes management plan as needed. Write down: ? Food  that you eat before and after you exercise. ? Blood glucose levels before and after you exercise. ? The type and amount of exercise you have done. ? When your insulin is expected to peak, if you use insulin. Avoid exercising at times when your insulin is peaking.  When you start a new  exercise or activity, work with your health care provider to make sure the activity is safe for you, and to adjust your insulin, medicines, or food intake as needed.  Drink plenty of water while you exercise to prevent dehydration or heat stroke. Drink enough fluid to keep your urine clear or pale yellow. This information is not intended to replace advice given to you by your health care provider. Make sure you discuss any questions you have with your health care provider. Document Released: 01/09/2004 Document Revised: 05/08/2016 Document Reviewed: 03/30/2016 Elsevier Interactive Patient Education  2018 [employer].

## 2017-11-19 NOTE — Progress Notes (Signed)
   Subjective:    Patient ID: Vanessa Reeves, female    DOB: 1971/06/10, 47 y.o.   MRN: [id number]  HPI DM- pt has gained 5 lbs since last visit (BMI >50).  On Metformin XR 500mg  daily.  On ARB for renal protection.  UTD on foot exam, eye exam.  Intermittent symptomatic lows.  No numbness/tingling of hands/feet.  No abd pain, N/V.  No regular exercise.  HTN- chronic problem, on Atenolol 50mg  , Lasix 20mg , Losartan HCTZ 100/25mg  daily w/ good control.  No CP, SOB, HAs, visual changes, edema  Mood- pt is on Lexapro 20mg  daily.  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  Pt is now going to [employer]- 'to try a different one'.  Lexapro is still working.  Memory issues- husband reports wife is having increased difficulty w/ memory.  Left glasses at laundry mat, forgot to pay bills when husband asked her to.     Review of Systems For ROS see HPI     Objective:   Physical Exam        Assessment & Plan:

## 2017-11-19 NOTE — Progress Notes (Signed)
Dr Birdie Riddle (PCP) seeing patient for follow-up of chronic medical issues. Unfortunately she had an emergency and had to leave before visit complete. As such I am finishing encounter with patient to discuss her acute concerns and to complete her physical examination.   Patient endorses notable change in memory over the past several months, noting she is forgetting simple things and misplacing her keys. 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 This has had her feeling down in the dumps. Denies SI/HI. Notes Lexapro is helping. 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 Notes occasional reflux and heart burn. Does note globus sensation. Denies PND or orthopnea. Denies epigastric pain or tenderness. Denies nausea or vomiting.  Past Medical History:  Diagnosis Date  . Anxiety   . Diabetes mellitus without complication ([place])   . Herpes   . Hypertension   . RLS (restless legs syndrome) 03/17/2017   Right leg    Current Outpatient Medications on File Prior to Visit  Medication Sig Dispense Refill  . ACCU-CHEK FASTCLIX LANCETS MISC Use one lancet each time sugars are checked. Pt tests twice daily. 102 each 12  . acetaminophen (TYLENOL) 325 MG tablet Take 2 tablets (650 mg total) by mouth every 6 (six) hours as needed. 90 tablet 3  . ALL DAY ALLERGY 10 MG tablet TAKE 1 TABLET BY MOUTH ONCE DAILY 30 tablet 11  . atenolol (TENORMIN) 50 MG tablet Take 2 tablets (100 mg total) by mouth at bedtime. 180 tablet 1  . beclomethasone (QVAR) 80 MCG/ACT inhaler Inhale 1 puff into the lungs 2 (two) times daily. 1 Inhaler 12  . desonide (DESOWEN) 0.05 % cream APPLY TO THE AFFECTED AREA(S) TWICE DAILY 60 g 3  . escitalopram (LEXAPRO) 20 MG tablet Take 1 tablet (20 mg total) by mouth daily. 30 tablet 3  . fluticasone (FLONASE) 50 MCG/ACT nasal spray PLACE 2 SPRAYS INTO BOTH NOSTRILS DAILY. (Patient taking  differently: Place 2 sprays into both nostrils as needed. ) 16 g 6  . furosemide (LASIX) 20 MG tablet TAKE 1 TABLET BY MOUTH ONCE DAILY 30 tablet 3  . glucose blood test strip Use as instructed to test your sugars twice daily. Dx. E11.9. Pt has an accu-chek glucometer. 100 each 12  . KLOR-CON M20 20 MEQ tablet TAKE 2 TABLETS BY MOUTH DAILY 60 tablet 6  . losartan-hydrochlorothiazide (HYZAAR) 100-25 MG tablet Take 1 tablet by mouth daily. 90 tablet 1  . metFORMIN (GLUCOPHAGE-XR) 500 MG 24 hr tablet TAKE 1 TABLET BY MOUTH ONCE DAILY WITH BREAKFAST 90 tablet 1  . ranitidine (ZANTAC) 300 MG tablet TAKE 1 TABLET BY MOUTH AT BEDTIME. 30 tablet 6  . SSS 10-5 10-5 % FOAM APPLY TO THE AFFECTED AREA ONCE DAILY 100 g 1  . tiZANidine (ZANAFLEX) 4 MG tablet Take 1 tablet (4 mg total) by mouth every 6 (six) hours as needed for muscle spasms. 40 tablet 1  . valACYclovir (VALTREX) 1000 MG tablet Take 1 tablet (1,000 mg total) by mouth 2 (two) times daily. (Patient taking differently: Take 1,000 mg by mouth as needed. ) 20 tablet 3   No current facility-administered medications on file prior to visit.     Allergies  Allergen Reactions  . Penicillins Other (See Comments)    Childhood allergy.    Family History  Problem Relation Age of Onset  . Hypertension Mother   .  Stroke Father   . Hypertension Paternal Grandmother   . Diabetes Mellitus II Paternal Grandmother   . Healthy Brother   . Healthy Daughter   . Healthy Son     Social History   Socioeconomic History  . Marital status: Married    Spouse name: None  . Number of children: None  . Years of education: None  . Highest education level: None  Social Needs  . Financial resource strain: None  . Food insecurity - worry: None  . Food insecurity - inability: None  . Transportation needs - medical: None  . Transportation needs - non-medical: None  Occupational History  . None  Tobacco Use  . Smoking status: Former Research scientist (life sciences)  . Smokeless  tobacco: Never Used  Substance and Sexual Activity  . Alcohol use: Yes    Alcohol/week: 0.0 oz    Comment: socially  . Drug use: No  . Sexual activity: Yes    Birth control/protection: Surgical  Other Topics Concern  . None  Social History Narrative   Lives with husband and 2 children in a 2 story home.     Works as a Chartered certified accountant at [employer].     Highest level of education:  High school, in college now   Review of Systems - See HPI.  All other ROS are negative.  BP 116/82   Pulse (!) 59   Temp 98.2 F (36.8 C) (Oral)   Resp 16   Ht 5\' 4"  (1.626 m)   Wt (!) 313 lb 8 oz (142.2 kg)   LMP 12/04/2011   SpO2 98%   BMI 53.81 kg/m   Physical Exam  Constitutional: She is oriented to person, place, and time and well-developed, well-nourished, and in no distress.  HENT:  Head: Normocephalic and atraumatic.  Eyes: Conjunctivae are normal. Pupils are equal, round, and reactive to light.  Neck: Neck supple. No thyromegaly present.  Cardiovascular: Normal rate, regular rhythm, normal heart sounds and intact distal pulses.  Pulmonary/Chest: Effort normal and breath sounds normal. No respiratory distress. She has no wheezes. She has no rales. She exhibits no tenderness.  Lymphadenopathy:    She has no cervical adenopathy.  Neurological: She is alert and oriented to person, place, and time.  Skin: Skin is warm and dry.  Psychiatric: Affect normal.  Vitals reviewed.  Assessment/Plan: Gastroesophageal reflux disease without esophagitis GERD diet reviewed. Start two week trial of Pantoprazole. Follow-up with PCP.  Memory changes MMSE score of 28/30. Suspect issue is related to depression which is starting to improve with situational changes. Will check labs today to include TSH to further assess. If not continuing to improve will need assessment with Dr. Si Raider.    Leeanne Rio, PA-C

## 2017-11-22 ENCOUNTER — Other Ambulatory Visit: Payer: Self-pay | Admitting: Family Medicine

## 2017-11-22 MED FILL — SSS 10-5 FOAM: 10-5 | 30 days supply | Qty: 100 | Fill #1

## 2017-11-22 MED FILL — VENTOLIN HFA 90 MCG INHALER: 108 (90 BAS | 25 days supply | Qty: 18 | Fill #0

## 2017-11-23 DIAGNOSIS — R413 Other amnesia: Secondary | ICD-10-CM | POA: Insufficient documentation

## 2017-11-23 NOTE — Assessment & Plan Note (Signed)
GERD diet reviewed. Start two week trial of Pantoprazole. Follow-up with PCP.

## 2017-11-23 NOTE — Assessment & Plan Note (Signed)
MMSE score of 28/30. Suspect issue is related to depression which is starting to improve with situational changes. Will check labs today to include TSH to further assess. If not continuing to improve will need assessment with Dr. Si Raider.

## 2017-11-24 NOTE — Assessment & Plan Note (Signed)
Pt feels the Lexapro is still helping w/ depression and anxiety but I suspect her memory issues are related to her anxiety.  Will follow.

## 2017-11-24 NOTE — Assessment & Plan Note (Signed)
Chronic problem.  Pt is on Metformin w/o difficulty.  UTD on foot exam, eye exam.  On ARB for renal protection.  Stressed need for healthy diet and regular exercise.  Will follow.

## 2017-11-24 NOTE — Assessment & Plan Note (Signed)
Chronic problem.  Well controlled today.  Asymptomatic.  Check labs.  No anticipated med changes.  Will follow. 

## 2017-11-24 NOTE — Assessment & Plan Note (Signed)
Chronic problem.  Pt has gained another 5 lbs.  Stressed need for healthy diet and regular exercise.  Will follow.

## 2017-12-06 ENCOUNTER — Other Ambulatory Visit: Payer: Self-pay | Admitting: Family Medicine

## 2017-12-06 MED FILL — tiZANidine HCL 4 MG TABS: 4 | 10 days supply | Qty: 40 | Fill #1

## 2017-12-06 MED FILL — ATENOLOL 50 MG TABLET: 50 | 90 days supply | Qty: 180 | Fill #0

## 2017-12-06 MED FILL — FLUTICASONE PROP 50 MCG SPR: 50 | 30 days supply | Qty: 16 | Fill #0

## 2017-12-23 ENCOUNTER — Other Ambulatory Visit: Payer: Self-pay | Admitting: Family Medicine

## 2017-12-23 MED FILL — DESONIDE 0.05 % CREA: 0.05 | 20 days supply | Qty: 60 | Fill #1

## 2017-12-23 MED FILL — FUROSEMIDE 20 MG TABS: 20 | 30 days supply | Qty: 30 | Fill #2

## 2017-12-23 MED FILL — ESCITALOPRAM 20 MG TABLET: 20 | 30 days supply | Qty: 30 | Fill #0

## 2018-01-10 ENCOUNTER — Other Ambulatory Visit: Payer: Self-pay | Admitting: Family Medicine

## 2018-01-10 MED FILL — POTASSIUM CL ER 20 MEQ TABL: 20 | 30 days supply | Qty: 60 | Fill #0

## 2018-01-10 MED FILL — METFORMIN HCL ER 500 MG TAB: 500 | 90 days supply | Qty: 90 | Fill #0

## 2018-01-10 MED FILL — LOSARTAN POTASSIUM-HCTZ 100: 100-25 | 90 days supply | Qty: 90 | Fill #0

## 2018-01-18 ENCOUNTER — Other Ambulatory Visit: Payer: Self-pay | Admitting: Family Medicine

## 2018-01-18 MED FILL — tiZANidine HCL 4 MG TABS: 4 | 10 days supply | Qty: 40 | Fill #1

## 2018-01-18 MED FILL — ESCITALOPRAM 20 MG TABLET: 20 | 30 days supply | Qty: 30 | Fill #1

## 2018-01-19 MED FILL — SSS 10-5 FOAM: 10-5 | 30 days supply | Qty: 100 | Fill #0

## 2018-02-21 MED FILL — POTASSIUM CL ER 20 MEQ TABL: 20 | 30 days supply | Qty: 60 | Fill #1

## 2018-02-21 MED FILL — ESCITALOPRAM 20 MG TABLET: 20 | 30 days supply | Qty: 30 | Fill #2

## 2018-03-04 ENCOUNTER — Ambulatory Visit: Payer: 59 | Admitting: Physician Assistant

## 2018-03-04 ENCOUNTER — Other Ambulatory Visit: Payer: Self-pay

## 2018-03-04 ENCOUNTER — Ambulatory Visit (HOSPITAL_BASED_OUTPATIENT_CLINIC_OR_DEPARTMENT_OTHER)
Admission: RE | Admit: 2018-03-04 | Discharge: 2018-03-04 | Disposition: A | Discharge status: Home / Self Care | Payer: 59 | Source: Ambulatory Visit | Attending: Physician Assistant | Admitting: Physician Assistant

## 2018-03-04 ENCOUNTER — Encounter: Payer: Self-pay | Admitting: Physician Assistant

## 2018-03-04 VITALS — BP 130/82 | HR 82 | Temp 98.5°F | Resp 15 | Ht 64.0 in | Wt 317.8 lb

## 2018-03-04 DIAGNOSIS — R05 Cough: Secondary | ICD-10-CM | POA: Diagnosis not present

## 2018-03-04 DIAGNOSIS — R0602 Shortness of breath: Secondary | ICD-10-CM | POA: Diagnosis not present

## 2018-03-04 DIAGNOSIS — R053 Chronic cough: Secondary | ICD-10-CM

## 2018-03-04 MED ORDER — CLOTRIMAZOLE-BETAMETHASONE 1-0.05 % EX CREA
1.0000 | TOPICAL_CREAM | Freq: Two times a day (BID) | CUTANEOUS | 0 refills | Status: DC
Start: 2018-03-04 — End: 2018-04-11

## 2018-03-04 MED ORDER — BENZONATATE 100 MG PO CAPS: 100.0000 mg | ORAL_CAPSULE | Freq: Two times a day (BID) | ORAL | 0 refills | Status: DC | PRN

## 2018-03-04 MED ORDER — MONTELUKAST SODIUM 10 MG PO TABS: 10.0000 mg | ORAL_TABLET | Freq: Every day | ORAL | 3 refills | Status: DC

## 2018-03-04 NOTE — Progress Notes (Signed)
Patient presents to clinic today c/o 2.5-3 years of dry cough. Notes worse at night. Notes environmental allergies which she takes Flonase and OTC Claritin. Denies sinus pain but notes occasional sinus pressure. Denies ear pain, fever, headache. Cough is dry mostly. Denies reflux or heart burn. Is using inhalers without much improvement in chest tightness and wheeze that she experiences. Has stopped taking her Qvar.    Past Medical History:  Diagnosis Date  . Anxiety   . Diabetes mellitus without complication ([place])   . Herpes   . Hypertension   . RLS (restless legs syndrome) 03/17/2017   Right leg    Current Outpatient Medications on File Prior to Visit  Medication Sig Dispense Refill  . ACCU-CHEK FASTCLIX LANCETS MISC Use one lancet each time sugars are checked. Pt tests twice daily. 102 each 12  . ALL DAY ALLERGY 10 MG tablet TAKE 1 TABLET BY MOUTH ONCE DAILY 30 tablet 11  . atenolol (TENORMIN) 50 MG tablet TAKE 2 TABLETS BY MOUTH AT BEDTIME. 180 tablet 1  . desonide (DESOWEN) 0.05 % cream APPLY TO THE AFFECTED AREA(S) TWICE DAILY 60 g 3  . escitalopram (LEXAPRO) 20 MG tablet TAKE 1 TABLET BY MOUTH ONCE DAILY 30 tablet 3  . fluticasone (FLONASE) 50 MCG/ACT nasal spray PLACE 2 SPRAYS INTO BOTH NOSTRILS DAILY. 16 g 6  . furosemide (LASIX) 20 MG tablet TAKE 1 TABLET BY MOUTH ONCE DAILY 30 tablet 3  . glucose blood test strip Use as instructed to test your sugars twice daily. Dx. E11.9. Pt has an accu-chek glucometer. 100 each 12  . losartan-hydrochlorothiazide (HYZAAR) 100-25 MG tablet TAKE 1 TABLET BY MOUTH DAILY. 90 tablet 1  . metFORMIN (GLUCOPHAGE-XR) 500 MG 24 hr tablet TAKE 1 TABLET BY MOUTH ONCE DAILY WITH BREAKFAST 90 tablet 1  . pantoprazole (PROTONIX) 40 MG tablet Take 1 tablet (40 mg total) by mouth daily. 30 tablet 3  . potassium chloride SA (K-DUR,KLOR-CON) 20 MEQ tablet TAKE 2 TABLETS BY MOUTH DAILY 60 tablet 6  . ranitidine (ZANTAC) 300 MG tablet TAKE 1 TABLET BY MOUTH AT  BEDTIME. 30 tablet 6  . SSS 10-5 10-5 % FOAM APPLY TO THE AFFECTED AREA ONCE DAILY 100 g 1  . tiZANidine (ZANAFLEX) 4 MG tablet Take 1 tablet (4 mg total) by mouth every 6 (six) hours as needed for muscle spasms. 40 tablet 1  . valACYclovir (VALTREX) 1000 MG tablet Take 1 tablet (1,000 mg total) by mouth 2 (two) times daily. (Patient taking differently: Take 1,000 mg by mouth as needed. ) 20 tablet 3  . VENTOLIN HFA 108 (90 Base) MCG/ACT inhaler INHALE 2 PUFFS BY MOUTH INTO THE LUNGS EVERY 6 HOURS AS NEEDED FOR WHEEZING OR SHORTNESS OF BREATH 18 g 2  . acetaminophen (TYLENOL) 325 MG tablet Take 2 tablets (650 mg total) by mouth every 6 (six) hours as needed. (Patient not taking: Reported on 03/04/2018) 90 tablet 3   No current facility-administered medications on file prior to visit.     Allergies  Allergen Reactions  . Penicillins Other (See Comments)    Childhood allergy.    Family History  Problem Relation Age of Onset  . Hypertension Mother   . Stroke Father   . Hypertension Paternal Grandmother   . Diabetes Mellitus II Paternal Grandmother   . Healthy Brother   . Healthy Daughter   . Healthy Son     Social History   Socioeconomic History  . Marital status: Married  Spouse name: Not on file  . Number of children: Not on file  . Years of education: Not on file  . Highest education level: Not on file  Occupational History  . Not on file  Social Needs  . Financial resource strain: Not on file  . Food insecurity:    Worry: Not on file    Inability: Not on file  . Transportation needs:    Medical: Not on file    Non-medical: Not on file  Tobacco Use  . Smoking status: Former Research scientist (life sciences)  . Smokeless tobacco: Never Used  Substance and Sexual Activity  . Alcohol use: Yes    Alcohol/week: 0.0 oz    Comment: socially  . Drug use: No  . Sexual activity: Yes    Birth control/protection: Surgical  Lifestyle  . Physical activity:    Days per week: Not on file    Minutes  per session: Not on file  . Stress: Not on file  Relationships  . Social connections:    Talks on phone: Not on file    Gets together: Not on file    Attends religious service: Not on file    Active member of club or organization: Not on file    Attends meetings of clubs or organizations: Not on file    Relationship status: Not on file  Other Topics Concern  . Not on file  Social History Narrative   Lives with husband and 2 children in a 2 story home.     Works as a Chartered certified accountant at [employer].     Highest level of education:  High school, in college now   Review of Systems - See HPI.  All other ROS are negative.  BP 130/82   Pulse 82   Temp 98.5 F (36.9 C) (Oral)   Resp 15   Ht 5\' 4"  (1.626 m)   Wt (!) 317 lb 12.8 oz (144.2 kg)   LMP 12/04/2011   SpO2 96%   BMI 54.55 kg/m   Physical Exam  Constitutional: She is oriented to person, place, and time. She appears well-developed and well-nourished.  HENT:  Head: Normocephalic and atraumatic.  Right Ear: External ear normal.  Left Ear: External ear normal.  Eyes: Conjunctivae are normal.  Cardiovascular: Normal rate, regular rhythm, normal heart sounds and intact distal pulses.  Pulmonary/Chest: Effort normal and breath sounds normal. No stridor. No respiratory distress. She has no wheezes. She has no rales. She exhibits no tenderness.  Neurological: She is alert and oriented to person, place, and time.  Vitals reviewed.  Assessment/Plan: 1. Chronic cough > 2 years. Suspect allergy induced versus silent reflux. Supportive measures and OTC medications reviewed. Continue Flonase and Claritin. Will add on Singulair to see if this helps. Will obtain CXR today. Referral to Pulmonology placed for further assessment of ongoing symptoms.  - DG Chest 2 View; Future - montelukast (SINGULAIR) 10 MG tablet; Take 1 tablet (10 mg total) by mouth at bedtime.  Dispense: 30 tablet; Refill: 3 - Ambulatory referral to Pulmonology -  benzonatate (TESSALON) 100 MG capsule; Take 1 capsule (100 mg total) by mouth 2 (two) times daily as needed for cough.  Dispense: 20 capsule; Refill: 0   Leeanne Rio, PA-C

## 2018-03-04 NOTE — Patient Instructions (Signed)
Please go to the [facility] office for x-ray. I will call with results.  Continue allergy medications. Start the Singulair as directed for allergies/breathing.  Use the tessalon as directed if needed for cough. Elevate the head of your bed. I am sending you to pulmonology for further assessment and pulmonary function testing.

## 2018-03-07 ENCOUNTER — Ambulatory Visit (INDEPENDENT_AMBULATORY_CARE_PROVIDER_SITE_OTHER): Payer: 59

## 2018-03-07 DIAGNOSIS — Z111 Encounter for screening for respiratory tuberculosis: Secondary | ICD-10-CM

## 2018-03-10 ENCOUNTER — Telehealth: Payer: Self-pay | Admitting: *Deleted

## 2018-03-10 NOTE — Telephone Encounter (Signed)
Called patient and left detailed VM.  Patient had her TB Skin test on Monday and was supposed to come back to have it read on Wednesday and she did not.  She has until 3:45 today (03/10/18) to get this read or it will not be valid as that will be the 72 hour mark and she will have to repeat the test.   I left these instructions for patient and she can come by the office any time this afternoon to have this read.  If patient calls back, okay for PEC to discuss with patient.

## 2018-03-10 NOTE — Telephone Encounter (Signed)
Pt did not show up for appt

## 2018-03-26 ENCOUNTER — Encounter: Payer: Self-pay | Admitting: Family Medicine

## 2018-03-29 MED FILL — DESONIDE 0.05 % CREA: 0.05 | 20 days supply | Qty: 60 | Fill #2

## 2018-03-29 MED FILL — SSS 10-5 FOAM: 10-5 | 30 days supply | Qty: 100 | Fill #1

## 2018-04-11 ENCOUNTER — Ambulatory Visit (INDEPENDENT_AMBULATORY_CARE_PROVIDER_SITE_OTHER): Payer: 59 | Admitting: Physician Assistant

## 2018-04-11 ENCOUNTER — Encounter: Payer: Self-pay | Admitting: Physician Assistant

## 2018-04-11 ENCOUNTER — Other Ambulatory Visit: Payer: Self-pay

## 2018-04-11 VITALS — BP 122/82 | HR 65 | Temp 98.2°F | Resp 16 | Ht 64.0 in | Wt 317.0 lb

## 2018-04-11 DIAGNOSIS — Z1231 Encounter for screening mammogram for malignant neoplasm of breast: Secondary | ICD-10-CM | POA: Diagnosis not present

## 2018-04-11 DIAGNOSIS — F418 Other specified anxiety disorders: Secondary | ICD-10-CM | POA: Diagnosis not present

## 2018-04-11 DIAGNOSIS — Z1211 Encounter for screening for malignant neoplasm of colon: Secondary | ICD-10-CM

## 2018-04-11 DIAGNOSIS — F5101 Primary insomnia: Secondary | ICD-10-CM

## 2018-04-11 DIAGNOSIS — E1100 Type 2 diabetes mellitus with hyperosmolarity without nonketotic hyperglycemic-hyperosmolar coma (NKHHC): Secondary | ICD-10-CM

## 2018-04-11 DIAGNOSIS — D649 Anemia, unspecified: Secondary | ICD-10-CM | POA: Diagnosis not present

## 2018-04-11 DIAGNOSIS — I10 Essential (primary) hypertension: Secondary | ICD-10-CM | POA: Diagnosis not present

## 2018-04-11 DIAGNOSIS — Z0000 Encounter for general adult medical examination without abnormal findings: Secondary | ICD-10-CM

## 2018-04-11 DIAGNOSIS — Z1239 Encounter for other screening for malignant neoplasm of breast: Secondary | ICD-10-CM

## 2018-04-11 HISTORY — DX: Encounter for screening for malignant neoplasm of colon: Z12.11

## 2018-04-11 LAB — POCT URINALYSIS DIPSTICK
BILIRUBIN UA: NEGATIVE
GLUCOSE UA: NEGATIVE
Ketones, UA: NEGATIVE
Leukocytes, UA: NEGATIVE
Nitrite, UA: NEGATIVE
Protein, UA: POSITIVE — AB
RBC UA: NEGATIVE
SPEC GRAV UA: 1.02 (ref 1.010–1.025)
Urobilinogen, UA: 0.2 E.U./dL
pH, UA: 6 (ref 5.0–8.0)

## 2018-04-11 NOTE — Patient Instructions (Signed)
Please go to the lab today for blood work.  I will call you with your results. We will alter treatment regimen(s) if indicated by your results.   Please keep up with current medication regimen.  Try to follow the sleep hygiene recommendations below. Also start a nightly over-the-counter Pure ZZZs supplement to help with sleep.  If things are not improving, please let us know.  Try to work hard on diet. Exercise goal will be for 150 minutes of aerobic activity per week.   Preventive Care 40-64 Years, Female Preventive care refers to lifestyle choices and visits with your health care provider that can promote health and wellness. What does preventive care include?  A yearly physical exam. This is also called an annual well check.  Dental exams once or twice a year.  Routine eye exams. Ask your health care provider how often you should have your eyes checked.  Personal lifestyle choices, including: ? Daily care of your teeth and gums. ? Regular physical activity. ? Eating a healthy diet. ? Avoiding tobacco and drug use. ? Limiting alcohol use. ? Practicing safe sex. ? Taking low-dose aspirin daily starting at age 51. ? Taking vitamin and mineral supplements as recommended by your health care provider. What happens during an annual well check? The services and screenings done by your health care provider during your annual well check will depend on your age, overall health, lifestyle risk factors, and family history of disease. Counseling Your health care provider may ask you questions about your:  Alcohol use.  Tobacco use.  Drug use.  Emotional well-being.  Home and relationship well-being.  Sexual activity.  Eating habits.  Work and work Statistician.  Method of birth control.  Menstrual cycle.  Pregnancy history.  Screening You may have the following tests or measurements:  Height, weight, and BMI.  Blood pressure.  Lipid and cholesterol levels. These  may be checked every 5 years, or more frequently if you are over 64 years old.  Skin check.  Lung cancer screening. You may have this screening every year starting at age 73 if you have a 30-pack-year history of smoking and currently smoke or have quit within the past 15 years.  Fecal occult blood test (FOBT) of the stool. You may have this test every year starting at age 17.  Flexible sigmoidoscopy or colonoscopy. You may have a sigmoidoscopy every 5 years or a colonoscopy every 10 years starting at age 76.  Hepatitis C blood test.  Hepatitis B blood test.  Sexually transmitted disease (STD) testing.  Diabetes screening. This is done by checking your blood sugar (glucose) after you have not eaten for a while (fasting). You may have this done every 1-3 years.  Mammogram. This may be done every 1-2 years. Talk to your health care provider about when you should start having regular mammograms. This may depend on whether you have a family history of breast cancer.  BRCA-related cancer screening. This may be done if you have a family history of breast, ovarian, tubal, or peritoneal cancers.  Pelvic exam and Pap test. This may be done every 3 years starting at age 43. Starting at age 30, this may be done every 5 years if you have a Pap test in combination with an HPV test.  Bone density scan. This is done to screen for osteoporosis. You may have this scan if you are at high risk for osteoporosis.  Discuss your test results, treatment options, and if necessary, the need for  more tests with your health care provider. Vaccines Your health care provider may recommend certain vaccines, such as:  Influenza vaccine. This is recommended every year.  Tetanus, diphtheria, and acellular pertussis (Tdap, Td) vaccine. You may need a Td booster every 10 years.  Varicella vaccine. You may need this if you have not been vaccinated.  Zoster vaccine. You may need this after age 76.  Measles, mumps,  and rubella (MMR) vaccine. You may need at least one dose of MMR if you were born in 1957 or later. You may also need a second dose.  Pneumococcal 13-valent conjugate (PCV13) vaccine. You may need this if you have certain conditions and were not previously vaccinated.  Pneumococcal polysaccharide (PPSV23) vaccine. You may need one or two doses if you smoke cigarettes or if you have certain conditions.  Meningococcal vaccine. You may need this if you have certain conditions.  Hepatitis A vaccine. You may need this if you have certain conditions or if you travel or work in places where you may be exposed to hepatitis A.  Hepatitis B vaccine. You may need this if you have certain conditions or if you travel or work in places where you may be exposed to hepatitis B.  Haemophilus influenzae type b (Hib) vaccine. You may need this if you have certain conditions.  Talk to your health care provider about which screenings and vaccines you need and how often you need them. This information is not intended to replace advice given to you by your health care provider. Make sure you discuss any questions you have with your health care provider. Document Released: 11/15/2015 Document Revised: 07/08/2016 Document Reviewed: 08/20/2015 Elsevier Interactive Patient Education  [employer].

## 2018-04-11 NOTE — Progress Notes (Signed)
Patient presents to clinic today for annual exam.  Patient is fasting for labs.  Diet -- Endorses trying to keep a low-carb diet. States she does better on some weeks than others. School and work schedule is making this more challenging.   Exercise -- none currently  Acute Concerns: Patient endorses issue with both falling and staying asleep. Notes this has been an issue since restarting school in January. Notes laying in bed for several hours before falling asleep. Once asleep she does ok unless she is accidentally awakened by her husband. If this happens, she is unable to get back to sleep. Has not taken anything for symptoms. Notes she does sleep in a completely dark room.   Chronic Issues: Hypertension -- Patient is currently on a regimen of atenolol 50 mg, losartan-hctz 100-25 mg. Endorses taking as directed. Patient denies chest pain, palpitations, lightheadedness, dizziness, vision changes or frequent headaches.  BP Readings from Last 3 Encounters:  04/11/18 122/82  03/04/18 130/82  11/19/17 116/82    Diabetes Mellitus II -- Is currently on regimen of Metformin XR 500 mg daily. Endorses taking medication as directed. Notes non fasting sugars < 180. Has not been checking fasting sugars recently. Patient due for repeat diabetic eye exam. Last 1 year ago -- no evidence of retinopathy. Patient is due for foot examination. Denies numbness, tingling or other concerns today. Does note occasional foul-smelling urine but none currently. Denies dysuria, hematuria or polyuria.  Immunizations are up-to-date.   Anxiety -- Is currently on Lexapro 20 mg daily. Is taking as directed and notes anxiety and mood is doing well. Denies panic attack, SI/HI.  Health Maintenance: Immunizations -- up-to-date.  Mammogram -- Due. Denies concerns. Agrees to repeat mammogram.  PAP -- s/p hysterectomy.  Past Medical History:  Diagnosis Date  . Anxiety   . Diabetes mellitus without complication ([place])     . Herpes   . Hypertension   . RLS (restless legs syndrome) 03/17/2017   Right leg    Past Surgical History:  Procedure Laterality Date  . ABDOMINAL HYSTERECTOMY    . DILATION AND CURETTAGE OF UTERUS    . TUBAL LIGATION      Current Outpatient Medications on File Prior to Visit  Medication Sig Dispense Refill  . ACCU-CHEK FASTCLIX LANCETS MISC Use one lancet each time sugars are checked. Pt tests twice daily. 102 each 12  . acetaminophen (TYLENOL) 325 MG tablet Take 2 tablets (650 mg total) by mouth every 6 (six) hours as needed. 90 tablet 3  . ALL DAY ALLERGY 10 MG tablet TAKE 1 TABLET BY MOUTH ONCE DAILY 30 tablet 11  . atenolol (TENORMIN) 50 MG tablet TAKE 2 TABLETS BY MOUTH AT BEDTIME. 180 tablet 1  . desonide (DESOWEN) 0.05 % cream APPLY TO THE AFFECTED AREA(S) TWICE DAILY 60 g 3  . escitalopram (LEXAPRO) 20 MG tablet TAKE 1 TABLET BY MOUTH ONCE DAILY 30 tablet 3  . fluticasone (FLONASE) 50 MCG/ACT nasal spray PLACE 2 SPRAYS INTO BOTH NOSTRILS DAILY. 16 g 6  . furosemide (LASIX) 20 MG tablet TAKE 1 TABLET BY MOUTH ONCE DAILY 30 tablet 3  . glucose blood test strip Use as instructed to test your sugars twice daily. Dx. E11.9. Pt has an accu-chek glucometer. 100 each 12  . losartan-hydrochlorothiazide (HYZAAR) 100-25 MG tablet TAKE 1 TABLET BY MOUTH DAILY. 90 tablet 1  . metFORMIN (GLUCOPHAGE-XR) 500 MG 24 hr tablet TAKE 1 TABLET BY MOUTH ONCE DAILY WITH BREAKFAST 90 tablet 1  .  montelukast (SINGULAIR) 10 MG tablet Take 1 tablet (10 mg total) by mouth at bedtime. 30 tablet 3  . pantoprazole (PROTONIX) 40 MG tablet Take 1 tablet (40 mg total) by mouth daily. 30 tablet 3  . potassium chloride SA (K-DUR,KLOR-CON) 20 MEQ tablet TAKE 2 TABLETS BY MOUTH DAILY 60 tablet 6  . ranitidine (ZANTAC) 300 MG tablet TAKE 1 TABLET BY MOUTH AT BEDTIME. 30 tablet 6  . valACYclovir (VALTREX) 1000 MG tablet Take 1 tablet (1,000 mg total) by mouth 2 (two) times daily. (Patient taking differently: Take  1,000 mg by mouth as needed. ) 20 tablet 3  . VENTOLIN HFA 108 (90 Base) MCG/ACT inhaler INHALE 2 PUFFS BY MOUTH INTO THE LUNGS EVERY 6 HOURS AS NEEDED FOR WHEEZING OR SHORTNESS OF BREATH 18 g 2   No current facility-administered medications on file prior to visit.     Allergies  Allergen Reactions  . Penicillins Other (See Comments)    Childhood allergy.    Family History  Problem Relation Age of Onset  . Hypertension Mother   . Stroke Father   . Hypertension Paternal Grandmother   . Diabetes Mellitus II Paternal Grandmother   . Healthy Brother   . Healthy Daughter   . Healthy Son     Social History   Socioeconomic History  . Marital status: Married    Spouse name: Not on file  . Number of children: Not on file  . Years of education: Not on file  . Highest education level: Not on file  Occupational History  . Not on file  Social Needs  . Financial resource strain: Not on file  . Food insecurity:    Worry: Not on file    Inability: Not on file  . Transportation needs:    Medical: Not on file    Non-medical: Not on file  Tobacco Use  . Smoking status: Former Research scientist (life sciences)  . Smokeless tobacco: Never Used  Substance and Sexual Activity  . Alcohol use: Yes    Alcohol/week: 0.0 oz    Comment: socially  . Drug use: No  . Sexual activity: Yes    Birth control/protection: Surgical  Lifestyle  . Physical activity:    Days per week: Not on file    Minutes per session: Not on file  . Stress: Not on file  Relationships  . Social connections:    Talks on phone: Not on file    Gets together: Not on file    Attends religious service: Not on file    Active member of club or organization: Not on file    Attends meetings of clubs or organizations: Not on file    Relationship status: Not on file  . Intimate partner violence:    Fear of current or ex partner: Not on file    Emotionally abused: Not on file    Physically abused: Not on file    Forced sexual activity: Not on  file  Other Topics Concern  . Not on file  Social History Narrative   Lives with husband and 2 children in a 2 story home.     Works as a Chartered certified accountant at [employer].     Highest level of education:  High school, in college now   Review of Systems  Constitutional: Negative for fever and weight loss.  HENT: Negative for ear discharge, ear pain, hearing loss and tinnitus.   Eyes: Negative for blurred vision, double vision, photophobia and pain.  Respiratory: Negative for  cough and shortness of breath.   Cardiovascular: Negative for chest pain and palpitations.  Gastrointestinal: Negative for abdominal pain, blood in stool, constipation, diarrhea, heartburn, melena, nausea and vomiting.  Genitourinary: Negative for dysuria, flank pain, frequency, hematuria and urgency.  Musculoskeletal: Negative for falls.  Neurological: Negative for dizziness, loss of consciousness and headaches.  Endo/Heme/Allergies: Negative for environmental allergies.  Psychiatric/Behavioral: Negative for depression, hallucinations, substance abuse and suicidal ideas. The patient is not nervous/anxious and does not have insomnia.    BP 122/82   Pulse 65   Temp 98.2 F (36.8 C) (Oral)   Resp 16   Ht 5\' 4"  (1.626 m)   Wt (!) 317 lb (143.8 kg)   LMP 12/04/2011   SpO2 99%   BMI 54.41 kg/m   Physical Exam  Constitutional: She is oriented to person, place, and time.  HENT:  Head: Normocephalic and atraumatic.  Right Ear: Tympanic membrane, external ear and ear canal normal.  Left Ear: Tympanic membrane, external ear and ear canal normal.  Nose: Nose normal. No mucosal edema.  Mouth/Throat: Uvula is midline, oropharynx is clear and moist and mucous membranes are normal. No oropharyngeal exudate or posterior oropharyngeal erythema.  Eyes: Pupils are equal, round, and reactive to light. Conjunctivae are normal.  Neck: Neck supple. No thyromegaly present.  Cardiovascular: Normal rate, regular rhythm, normal heart  sounds and intact distal pulses.  Pulmonary/Chest: Effort normal and breath sounds normal. No respiratory distress. She has no wheezes. She has no rales.  Abdominal: Soft. Bowel sounds are normal. She exhibits no distension and no mass. There is no tenderness. There is no rebound and no guarding.  Lymphadenopathy:    She has no cervical adenopathy.  Neurological: She is alert and oriented to person, place, and time. No cranial nerve deficit.  Skin: Skin is warm and dry. No rash noted.  Vitals reviewed.  Diabetic Foot Exam - Simple   Simple Foot Form Diabetic Foot exam was performed with the following findings:  Yes 04/11/2018  4:13 PM  Visual Inspection No deformities, no ulcerations, no other skin breakdown bilaterally:  Yes Sensation Testing Intact to touch and monofilament testing bilaterally:  Yes Pulse Check Posterior Tibialis and Dorsalis pulse intact bilaterally:  Yes Comments    Assessment/Plan: 1. Visit for preventive health examination Depression screen negative. Health Maintenance reviewed. Preventive schedule discussed and handout given in AVS. Will obtain fasting labs today.  - CBC with Differential/Platelet - Comprehensive metabolic panel - Hemoglobin A1c - Lipid panel  2. Essential hypertension BP stable today. Asymptomatic. Continue current regimen. Labs today. - Lipid panel  3. Type 2 diabetes mellitus with hyperosmolarity without coma, without long-term current use of insulin ([place]) Patient to schedule eye examination. Foot examination updated today with no abnormal findings. Continue current regimen. Dietary and exercise recommendations given. Labs as noted below. Will alter regimen according to results.  - Comprehensive metabolic panel - Hemoglobin A1c - Lipid panel - Urine Microalbumin w/creat. ratio - Urinalysis, Routine w reflex microscopic - POCT Urinalysis Dipstick  4. Depression with anxiety Stable. Continue current regimen.   5. Breast cancer  screening Declines exam. Asymptomatic. Agrees to repeat screening mammogram. Order placed.  - MM DIGITAL SCREENING BILATERAL; Future  6. Primary insomnia Reviewed sleep hygiene practices for her to try out. Start nightly OTC Pure ZZZs. Follow-up if not improving.    Leeanne Rio, PA-C

## 2018-04-12 ENCOUNTER — Other Ambulatory Visit: Payer: Self-pay

## 2018-04-12 ENCOUNTER — Other Ambulatory Visit (INDEPENDENT_AMBULATORY_CARE_PROVIDER_SITE_OTHER): Payer: 59

## 2018-04-12 DIAGNOSIS — D619 Aplastic anemia, unspecified: Secondary | ICD-10-CM | POA: Diagnosis not present

## 2018-04-12 LAB — COMPREHENSIVE METABOLIC PANEL
ALK PHOS: 76 U/L (ref 39–117)
ALT: 11 U/L (ref 0–35)
AST: 15 U/L (ref 0–37)
Albumin: 3.8 g/dL (ref 3.5–5.2)
BUN: 15 mg/dL (ref 6–23)
CO2: 32 meq/L (ref 19–32)
Calcium: 9.6 mg/dL (ref 8.4–10.5)
Chloride: 98 mEq/L (ref 96–112)
Creatinine, Ser: 0.92 mg/dL (ref 0.40–1.20)
GFR: 84.72 mL/min (ref >60.00)
GLUCOSE: 95 mg/dL (ref 70–99)
POTASSIUM: 3.8 meq/L (ref 3.5–5.1)
SODIUM: 138 meq/L (ref 135–145)
TOTAL PROTEIN: 7.4 g/dL (ref 6.0–8.3)
Total Bilirubin: 0.3 mg/dL (ref 0.2–1.2)

## 2018-04-12 LAB — MICROALBUMIN / CREATININE URINE RATIO
Creatinine,U: 139.1 mg/dL
Microalb Creat Ratio: 0.5 mg/g (ref 0.0–30.0)
Microalb, Ur: <0.7 mg/dL (ref 0.0–1.9)

## 2018-04-12 LAB — CBC WITH DIFFERENTIAL/PLATELET
BASOS ABS: 0.1 10*3/uL (ref 0.0–0.1)
Basophils Relative: 0.7 % (ref 0.0–3.0)
Eosinophils Absolute: 0.2 10*3/uL (ref 0.0–0.7)
Eosinophils Relative: 2.1 % (ref 0.0–5.0)
HCT: 34.2 % — ABNORMAL LOW (ref 36.0–46.0)
Hemoglobin: 11.3 g/dL — ABNORMAL LOW (ref 12.0–15.0)
LYMPHS ABS: 3.4 10*3/uL (ref 0.7–4.0)
Lymphocytes Relative: 39.8 % (ref 12.0–46.0)
MCHC: 33.1 g/dL (ref 30.0–36.0)
MCV: 85.2 fl (ref 78.0–100.0)
MONO ABS: 0.8 10*3/uL (ref 0.1–1.0)
Monocytes Relative: 9.3 % (ref 3.0–12.0)
NEUTROS PCT: 48.1 % (ref 43.0–77.0)
Neutro Abs: 4.1 10*3/uL (ref 1.4–7.7)
Platelets: 423 10*3/uL — ABNORMAL HIGH (ref 150.0–400.0)
RBC: 4.01 Mil/uL (ref 3.87–5.11)
RDW: 15.5 % (ref 11.5–15.5)
WBC: 8.6 10*3/uL (ref 4.0–10.5)

## 2018-04-12 LAB — URINALYSIS, ROUTINE W REFLEX MICROSCOPIC
BILIRUBIN URINE: NEGATIVE
Hgb urine dipstick: NEGATIVE
Ketones, ur: NEGATIVE
LEUKOCYTES UA: NEGATIVE
NITRITE: NEGATIVE
Specific Gravity, Urine: 1.02 (ref 1.000–1.030)
TOTAL PROTEIN, URINE-UPE24: NEGATIVE
Urine Glucose: NEGATIVE
Urobilinogen, UA: 0.2 (ref 0.0–1.0)
pH: 6 (ref 5.0–8.0)

## 2018-04-12 LAB — LIPID PANEL
CHOL/HDL RATIO: 3
Cholesterol: 141 mg/dL (ref 0–200)
HDL: 43.3 mg/dL (ref >39.00)
LDL CALC: 79 mg/dL (ref 0–99)
NONHDL: 97.89
TRIGLYCERIDES: 96 mg/dL (ref 0.0–149.0)
VLDL: 19.2 mg/dL (ref 0.0–40.0)

## 2018-04-12 LAB — HEMOGLOBIN A1C: Hgb A1c MFr Bld: 6.9 % — ABNORMAL HIGH (ref 4.6–6.5)

## 2018-04-12 LAB — IRON: IRON: 47 ug/dL (ref 42–145)

## 2018-04-13 ENCOUNTER — Other Ambulatory Visit: Payer: Self-pay | Admitting: Physician Assistant

## 2018-04-13 ENCOUNTER — Other Ambulatory Visit (INDEPENDENT_AMBULATORY_CARE_PROVIDER_SITE_OTHER): Payer: 59

## 2018-04-13 DIAGNOSIS — D649 Anemia, unspecified: Secondary | ICD-10-CM

## 2018-04-13 LAB — IBC PANEL
Iron: 46 ug/dL (ref 42–145)
SATURATION RATIOS: 13.4 % — AB (ref 20.0–50.0)
Transferrin: 245 mg/dL (ref 212.0–360.0)

## 2018-04-13 LAB — FERRITIN: Ferritin: 117 ng/mL (ref 10.0–291.0)

## 2018-04-13 NOTE — Addendum Note (Signed)
Addended by: Davis Gourd on: 04/13/2018 09:13 AM   Modules accepted: Orders

## 2018-04-14 LAB — IRON AND TIBC
IRON SATURATION: 16 % (ref 15–55)
Iron: 45 ug/dL (ref 27–159)
Total Iron Binding Capacity: 289 ug/dL (ref 250–450)
UIBC: 244 ug/dL (ref 131–425)

## 2018-04-14 LAB — FERRITIN: FERRITIN: 166 ng/mL — AB (ref 15–150)

## 2018-04-15 ENCOUNTER — Encounter: Payer: Self-pay | Admitting: Emergency Medicine

## 2018-04-18 MED FILL — LOSARTAN-HCTZ 100-25 MG TAB: 100-25 | 90 days supply | Qty: 90 | Fill #1

## 2018-04-18 MED FILL — METFORMIN HCL ER 500 MG TAB: 500 | 90 days supply | Qty: 90 | Fill #1

## 2018-04-18 MED FILL — ESCITALOPRAM 20 MG TABLET: 20 | 30 days supply | Qty: 30 | Fill #3

## 2018-04-18 MED FILL — FUROSEMIDE 20 MG TABS: 20 | 30 days supply | Qty: 30 | Fill #3

## 2018-04-18 MED FILL — ATENOLOL 50 MG TABLET: 50 | 90 days supply | Qty: 180 | Fill #1

## 2018-04-18 MED FILL — POTASSIUM CL ER 20 MEQ TABL: 20 | 30 days supply | Qty: 60 | Fill #2

## 2018-04-23 ENCOUNTER — Ambulatory Visit (HOSPITAL_BASED_OUTPATIENT_CLINIC_OR_DEPARTMENT_OTHER): Payer: 59

## 2018-05-03 ENCOUNTER — Encounter (HOSPITAL_BASED_OUTPATIENT_CLINIC_OR_DEPARTMENT_OTHER): Payer: Self-pay

## 2018-05-03 ENCOUNTER — Ambulatory Visit (HOSPITAL_BASED_OUTPATIENT_CLINIC_OR_DEPARTMENT_OTHER): Payer: 59

## 2018-05-06 ENCOUNTER — Other Ambulatory Visit: Payer: Self-pay | Admitting: Family Medicine

## 2018-05-10 ENCOUNTER — Other Ambulatory Visit: Payer: Self-pay | Admitting: Family Medicine

## 2018-05-10 ENCOUNTER — Telehealth: Payer: Self-pay | Admitting: Family Medicine

## 2018-05-10 ENCOUNTER — Encounter: Payer: Self-pay | Admitting: Family Medicine

## 2018-05-10 ENCOUNTER — Other Ambulatory Visit: Payer: Self-pay | Admitting: Emergency Medicine

## 2018-05-10 MED ORDER — SULFACETAMIDE SODIUM-SULFUR 10-5 % EX FOAM: CUTANEOUS | 3 refills | Status: DC

## 2018-05-10 MED FILL — SSS 10-5 FOAM: 10-5 | 30 days supply | Qty: 60 | Fill #0

## 2018-05-10 NOTE — Telephone Encounter (Signed)
Copied from [place] [id number]. Topic: Quick Communication - See Telephone Encounter >> May 10, 2018  1:29 PM Rutherford Nail, NT wrote: CRM for notification. See Telephone encounter for: 05/10/18. Patient calling and states that she would like a call back from Dr Birdie Riddle. States that a SSS 10-5 10-5 % FOAM was denied. Informed her that the reason it was denied was because the medication was discontinued. Patient begins raising voice and states that she did not stop the medication. States that she saw another provider and he must've done that. But she was not going to go in there and tell them to cancel the medication when she has been using that for years.

## 2018-05-10 NOTE — Telephone Encounter (Signed)
Medication was discontinued at OV with Elyn Aquas for CPE.  Refilled medication to the pharmacy since patient has used for years.

## 2018-05-10 NOTE — Telephone Encounter (Signed)
Pts husband calling and states to call him. Wife is in class and will be unable to answer.

## 2018-05-12 MED FILL — ESCITALOPRAM 20 MG TABLET: 20 | 30 days supply | Qty: 30 | Fill #0

## 2018-05-12 MED FILL — FUROSEMIDE 20 MG TABS: 20 | 30 days supply | Qty: 30 | Fill #0

## 2018-05-19 ENCOUNTER — Ambulatory Visit: Payer: 59 | Admitting: Family Medicine

## 2018-05-23 ENCOUNTER — Ambulatory Visit (INDEPENDENT_AMBULATORY_CARE_PROVIDER_SITE_OTHER): Payer: 59 | Admitting: *Deleted

## 2018-05-23 ENCOUNTER — Ambulatory Visit: Payer: 59 | Admitting: Physician Assistant

## 2018-05-23 DIAGNOSIS — Z111 Encounter for screening for respiratory tuberculosis: Secondary | ICD-10-CM

## 2018-05-23 DIAGNOSIS — D649 Anemia, unspecified: Secondary | ICD-10-CM

## 2018-05-23 LAB — CBC WITH DIFFERENTIAL/PLATELET
BASOS ABS: 0 10*3/uL (ref 0.0–0.1)
Basophils Relative: 0.3 % (ref 0.0–3.0)
Eosinophils Absolute: 0.2 10*3/uL (ref 0.0–0.7)
Eosinophils Relative: 2.2 % (ref 0.0–5.0)
HEMATOCRIT: 34.9 % — AB (ref 36.0–46.0)
Hemoglobin: 11.7 g/dL — ABNORMAL LOW (ref 12.0–15.0)
LYMPHS PCT: 38.9 % (ref 12.0–46.0)
Lymphs Abs: 2.9 10*3/uL (ref 0.7–4.0)
MCHC: 33.6 g/dL (ref 30.0–36.0)
MCV: 84.8 fl (ref 78.0–100.0)
MONOS PCT: 6.6 % (ref 3.0–12.0)
Monocytes Absolute: 0.5 10*3/uL (ref 0.1–1.0)
NEUTROS ABS: 3.9 10*3/uL (ref 1.4–7.7)
Neutrophils Relative %: 52 % (ref 43.0–77.0)
PLATELETS: 443 10*3/uL — AB (ref 150.0–400.0)
RBC: 4.12 Mil/uL (ref 3.87–5.11)
RDW: 15.5 % (ref 11.5–15.5)
WBC: 7.5 10*3/uL (ref 4.0–10.5)

## 2018-05-23 LAB — IBC PANEL
IRON: 41 ug/dL — AB (ref 42–145)
SATURATION RATIOS: 13.1 % — AB (ref 20.0–50.0)
TRANSFERRIN: 223 mg/dL (ref 212.0–360.0)

## 2018-05-25 ENCOUNTER — Encounter: Payer: Self-pay | Admitting: *Deleted

## 2018-05-25 LAB — TB SKIN TEST
Induration: 0 mm
TB SKIN TEST: NEGATIVE

## 2018-05-26 ENCOUNTER — Encounter: Payer: Self-pay | Admitting: Physician Assistant

## 2018-06-15 ENCOUNTER — Encounter: Payer: 59 | Admitting: Family Medicine

## 2018-06-30 MED FILL — SSS 10-5 FOAM: 10-5 | 30 days supply | Qty: 60 | Fill #1

## 2018-07-21 ENCOUNTER — Other Ambulatory Visit: Payer: Self-pay | Admitting: Family Medicine

## 2018-07-21 MED FILL — LOSARTAN-HCTZ 100-25 MG TAB: 100-25 | 30 days supply | Qty: 30 | Fill #0

## 2018-07-21 MED FILL — FUROSEMIDE 20 MG TABS: 20 | 30 days supply | Qty: 30 | Fill #1

## 2018-07-21 MED FILL — ATENOLOL 50 MG TABLET: 50 | 90 days supply | Qty: 180 | Fill #0

## 2018-07-21 MED FILL — METFORMIN HCL ER 500 MG TAB: 500 | 90 days supply | Qty: 90 | Fill #0

## 2018-07-21 MED FILL — ESCITALOPRAM 20 MG TABLET: 20 | 30 days supply | Qty: 30 | Fill #1

## 2018-07-21 MED FILL — POTASSIUM CL ER 20 MEQ TABL: 20 | 30 days supply | Qty: 60 | Fill #3

## 2018-07-25 MED FILL — SSS 10-5 FOAM: 10-5 | 30 days supply | Qty: 60 | Fill #2

## 2018-07-25 MED FILL — DESONIDE 0.05 % CREA: 0.05 | 20 days supply | Qty: 60 | Fill #3

## 2018-08-03 ENCOUNTER — Telehealth: Payer: Self-pay | Admitting: Family Medicine

## 2018-08-03 NOTE — Telephone Encounter (Signed)
Noted  

## 2018-08-03 NOTE — Telephone Encounter (Signed)
Received FMLA forms from [employer] placed in bin upfront with charge sheet, pt states she needs these filled out for anxiety and she has an upcoming appt with Dr. Birdie Riddle on 10/09.

## 2018-08-03 NOTE — Telephone Encounter (Signed)
Paperwork given to PCP for completion.  

## 2018-08-03 NOTE — Telephone Encounter (Signed)
Forms will wait until appt on 10/9 as anxiety is not typically something that requires FMLA and we will need to discuss

## 2018-08-08 ENCOUNTER — Other Ambulatory Visit: Payer: Self-pay | Admitting: Family Medicine

## 2018-08-08 DIAGNOSIS — Z1231 Encounter for screening mammogram for malignant neoplasm of breast: Secondary | ICD-10-CM

## 2018-08-10 ENCOUNTER — Encounter: Payer: Self-pay | Admitting: Family Medicine

## 2018-08-10 ENCOUNTER — Other Ambulatory Visit: Payer: Self-pay

## 2018-08-10 ENCOUNTER — Ambulatory Visit: Payer: 59 | Admitting: Family Medicine

## 2018-08-10 VITALS — BP 120/81 | HR 62 | Temp 98.1°F | Resp 16 | Ht 64.0 in | Wt 304.2 lb

## 2018-08-10 DIAGNOSIS — F418 Other specified anxiety disorders: Secondary | ICD-10-CM

## 2018-08-10 DIAGNOSIS — E119 Type 2 diabetes mellitus without complications: Secondary | ICD-10-CM | POA: Diagnosis not present

## 2018-08-10 DIAGNOSIS — L089 Local infection of the skin and subcutaneous tissue, unspecified: Secondary | ICD-10-CM | POA: Diagnosis not present

## 2018-08-10 MED ORDER — MUPIROCIN 2 % EX OINT: 1.0000 "application " | TOPICAL_OINTMENT | Freq: Two times a day (BID) | CUTANEOUS | 3 refills | Status: DC

## 2018-08-10 MED FILL — MUPIROCIN 2% OINTMENT: 2 | 15 days supply | Qty: 22 | Fill #0

## 2018-08-10 NOTE — Progress Notes (Signed)
   Subjective:    Patient ID: Vanessa Reeves, female    DOB: May 18, 1971, 47 y.o.   MRN: [id number]  HPI DM- chronic problem, on Metformin XR 500mg  daily.  On Losartan for renal protection.  Due for eye exam.  UTD on foot exam.  Pt is down 13 lbs since last visit.  Pt is doing Weight Watchers and Her[employer] Life.  Also exercising 3x/week.  No CP, SOB, HAs above baseline, visual changes, abd pain, N/V.  denies symptomatic lows.  No numbness/tingling of hands/feet.  Anxiety- on Lexapro 20mg  daily.  Pt feels medication is working- her panic attacks are less frequent.  Pt is passing all of her classes w/ extra testing time.  Needs FMLA completed for work in case of time missed for panic attacks.  Spot on face- pt reports this recurs yearly.  R cheek.  First appeared on Monday.  Not itching.  Some pain.  Improved w/ Mupirocin ointment.   Review of Systems For ROS see HPI     Objective:   Physical Exam  Constitutional: She is oriented to person, place, and time. She appears well-developed and well-nourished. No distress.  obese  HENT:  Head: Normocephalic and atraumatic.  Eyes: Pupils are equal, round, and reactive to light. Conjunctivae and EOM are normal.  Neck: Normal range of motion. Neck supple. No thyromegaly present.  Cardiovascular: Normal rate, regular rhythm, normal heart sounds and intact distal pulses.  No murmur heard. Pulmonary/Chest: Effort normal and breath sounds normal. No respiratory distress.  Abdominal: Soft. She exhibits no distension. There is no tenderness.  Musculoskeletal: She exhibits no edema.  Lymphadenopathy:    She has no cervical adenopathy.  Neurological: She is alert and oriented to person, place, and time.  Skin: Skin is warm and dry.  Scabbed area on R cheek under R eye w/ yellow crust  Psychiatric: She has a normal mood and affect. Her behavior is normal.          Assessment & Plan:  Facial infxn- recurrent issue.  Yellow crusting appears  consistent w/ impetigo.  Apply topical mupirocin.  Reviewed supportive care and red flags that should prompt return.  Pt expressed understanding and is in agreement w/ plan.

## 2018-08-10 NOTE — Assessment & Plan Note (Signed)
Improved since starting Lexapro 20mg  daily.  Feels her panicked episodes are fewer and farther apart than previously.  Needs updated FMLA forms to allow for intermittent work absences.  Will complete for pt.

## 2018-08-10 NOTE — Assessment & Plan Note (Signed)
Chronic problem.  Tolerating Metformin w/o difficulty.  On ARB for renal protection.  Due for eye exam.  Pt encouraged to schedule.  Applauded her 13 lb weight loss.  Check labs.  Adjust meds prn

## 2018-08-10 NOTE — Patient Instructions (Signed)
Follow up in 3-4 months to recheck diabetes, BP, cholesterol We'll notify you of your lab results and make any changes if needed Continue to work on healthy diet and regular exercise- you're doing great! CALL AND SCHEDULE YOUR EYE EXAM! Apply the Mupirocin ointment twice daily Call with any questions or concerns Happy Fall!!!

## 2018-08-11 ENCOUNTER — Ambulatory Visit (HOSPITAL_BASED_OUTPATIENT_CLINIC_OR_DEPARTMENT_OTHER)
Admission: RE | Admit: 2018-08-11 | Discharge: 2018-08-11 | Disposition: A | Discharge status: Home / Self Care | Payer: 59 | Source: Ambulatory Visit | Attending: Family Medicine | Admitting: Family Medicine

## 2018-08-11 ENCOUNTER — Encounter: Payer: Self-pay | Admitting: Family Medicine

## 2018-08-11 ENCOUNTER — Encounter: Payer: Self-pay | Admitting: General Practice

## 2018-08-11 DIAGNOSIS — Z1231 Encounter for screening mammogram for malignant neoplasm of breast: Secondary | ICD-10-CM | POA: Insufficient documentation

## 2018-08-11 LAB — BASIC METABOLIC PANEL
BUN: 16 mg/dL (ref 6–23)
CALCIUM: 9.8 mg/dL (ref 8.4–10.5)
CO2: 30 mEq/L (ref 19–32)
Chloride: 96 mEq/L (ref 96–112)
Creatinine, Ser: 0.91 mg/dL (ref 0.40–1.20)
GFR: 85.66 mL/min (ref >60.00)
Glucose, Bld: 91 mg/dL (ref 70–99)
POTASSIUM: 3.4 meq/L — AB (ref 3.5–5.1)
SODIUM: 136 meq/L (ref 135–145)

## 2018-08-11 LAB — HEMOGLOBIN A1C: Hgb A1c MFr Bld: 6.5 % (ref 4.6–6.5)

## 2018-08-12 ENCOUNTER — Telehealth: Payer: Self-pay | Admitting: Family Medicine

## 2018-08-12 ENCOUNTER — Other Ambulatory Visit: Payer: Self-pay | Admitting: Family Medicine

## 2018-08-12 NOTE — Telephone Encounter (Signed)
Thank you :)

## 2018-08-12 NOTE — Telephone Encounter (Signed)
Duplicate request. Dr Birdie Riddle has paperwork to complete already

## 2018-08-12 NOTE — Telephone Encounter (Signed)
Received FMLA forms for the pt, I have placed them in the bin with a charge sheet. Pt had an appt with KT on 08/10/18.

## 2018-08-14 MED FILL — FUROSEMIDE 20 MG TABS: 20 | 30 days supply | Qty: 30 | Fill #2

## 2018-08-15 ENCOUNTER — Telehealth: Payer: Self-pay | Admitting: Family Medicine

## 2018-08-15 DIAGNOSIS — Z0279 Encounter for issue of other medical certificate: Secondary | ICD-10-CM

## 2018-08-15 MED FILL — ACCU-CHEK FASTCLIX LANCETS: 51 days supply | Qty: 102 | Fill #0

## 2018-08-15 MED FILL — ACCU-CHEK GUIDE TEST STRIP: 50 days supply | Qty: 100 | Fill #0

## 2018-08-15 NOTE — Telephone Encounter (Signed)
Placed FMLA forms in bin with Charge sheet.

## 2018-08-16 NOTE — Telephone Encounter (Signed)
FMLA paperwork in your folder for completion

## 2018-08-17 ENCOUNTER — Encounter: Payer: Self-pay | Admitting: Family Medicine

## 2018-08-18 NOTE — Telephone Encounter (Signed)
FYI

## 2018-08-18 NOTE — Telephone Encounter (Signed)
Pt states that the Employee certification form aren't for disability, she states that it's still for FMLA. She states they need to these filled out because she has almost reached 1250hrs of FMLA. She did let me know that she will try to reached back out to the [employer] and have [employer] reach out to our office for further details.

## 2018-08-18 NOTE — Telephone Encounter (Signed)
I don't understand how she is at 1200+ hrs of FMLA. The FMLA forms are for 1-2 episodes monthly for 1/2-1 day.  So if she works 10 hr shifts and misses 2/month (which is the max) that would be 240 hrs per year.  So if she's been out for 1200+ hrs, that would be using her max amount of FMLA for over 5 yrs!  That is not the intention.

## 2018-08-18 NOTE — Telephone Encounter (Signed)
Spoke with pt. The 1250 hours is how many she has to work in a calendar year in order to qualify for straight FMLA. Since pt has only worked 1100+ hours she does not qualify for straight FMLA. Another department has to handle her claim. That is who is requesting the additional paperwork to be completed.   Pt states that she typically misses 1-2 days a month.

## 2018-08-18 NOTE — Telephone Encounter (Signed)
FMLA forms completed Pt also attached a disability accommodation form that we have not discussed and I am not able to complete at this time

## 2018-08-25 MED FILL — LOSARTAN-HCTZ 100-25 MG TAB: 100-25 | 30 days supply | Qty: 30 | Fill #1

## 2018-08-26 ENCOUNTER — Encounter: Payer: Self-pay | Admitting: Family Medicine

## 2018-08-26 DIAGNOSIS — G8929 Other chronic pain: Secondary | ICD-10-CM

## 2018-08-26 DIAGNOSIS — M533 Sacrococcygeal disorders, not elsewhere classified: Principal | ICD-10-CM

## 2018-08-29 ENCOUNTER — Other Ambulatory Visit: Payer: Self-pay | Admitting: Family Medicine

## 2018-08-29 DIAGNOSIS — M533 Sacrococcygeal disorders, not elsewhere classified: Secondary | ICD-10-CM

## 2018-09-01 ENCOUNTER — Ambulatory Visit
Admission: RE | Admit: 2018-09-01 | Discharge: 2018-09-01 | Disposition: A | Discharge status: Home / Self Care | Payer: 59 | Source: Ambulatory Visit | Attending: Family Medicine | Admitting: Family Medicine

## 2018-09-01 DIAGNOSIS — M533 Sacrococcygeal disorders, not elsewhere classified: Secondary | ICD-10-CM

## 2018-09-01 MED ORDER — IOPAMIDOL (ISOVUE-M 200) INJECTION 41%
1.0000 mL | Freq: Once | INTRAMUSCULAR | Status: AC
  Administered 2018-09-01: 1 mL via INTRA_ARTICULAR

## 2018-09-01 MED ORDER — METHYLPREDNISOLONE ACETATE 40 MG/ML INJ SUSP (RADIOLOG
120.0000 mg | Freq: Once | INTRAMUSCULAR | Status: AC
  Administered 2018-09-01: 120 mg via INTRA_ARTICULAR

## 2018-09-01 NOTE — Discharge Instructions (Signed)

## 2018-09-05 ENCOUNTER — Other Ambulatory Visit: Payer: 59

## 2018-09-12 MED FILL — SSS 10-5 FOAM: 10-5 | 30 days supply | Qty: 60 | Fill #3

## 2018-09-12 MED FILL — POTASSIUM CL ER 20 MEQ TABL: 20 | 30 days supply | Qty: 60 | Fill #4

## 2018-09-22 MED FILL — LOSARTAN-HCTZ 100-25 MG TAB: 100-25 | 30 days supply | Qty: 30 | Fill #2

## 2018-09-22 MED FILL — ESCITALOPRAM 20 MG TABLET: 20 | 30 days supply | Qty: 30 | Fill #2

## 2018-10-07 MED FILL — FUROSEMIDE 20 MG TABS: 20 | 30 days supply | Qty: 30 | Fill #3

## 2018-10-24 ENCOUNTER — Other Ambulatory Visit: Payer: Self-pay | Admitting: Family Medicine

## 2018-10-24 MED FILL — SSS 10-5 FOAM: 10-5 | 60 days supply | Qty: 60 | Fill #0

## 2018-10-24 MED FILL — LOSARTAN-HCTZ 100-25 MG TAB: 100-25 | 30 days supply | Qty: 30 | Fill #3

## 2018-10-24 MED FILL — ESCITALOPRAM 20 MG TABLET: 20 | 30 days supply | Qty: 30 | Fill #3

## 2018-10-24 MED FILL — ATENOLOL 50 MG TABLET: 50 | 90 days supply | Qty: 180 | Fill #1

## 2018-10-28 MED FILL — FLUTICASONE PROP 50 MCG SPR: 50 | 30 days supply | Qty: 16 | Fill #1

## 2018-10-28 MED FILL — metFORMIN HCL ER 500 MG TB2: 500 | 90 days supply | Qty: 90 | Fill #1

## 2018-10-28 MED FILL — ACCU-CHEK FASTCLIX LANCETS: 51 days supply | Qty: 102 | Fill #1

## 2018-10-28 MED FILL — ACCU-CHEK GUIDE TEST STRIP: 50 days supply | Qty: 100 | Fill #1

## 2018-11-14 ENCOUNTER — Other Ambulatory Visit: Payer: Self-pay

## 2018-11-14 ENCOUNTER — Ambulatory Visit: Payer: 59 | Admitting: Physician Assistant

## 2018-11-14 ENCOUNTER — Encounter: Payer: Self-pay | Admitting: Physician Assistant

## 2018-11-14 VITALS — BP 122/80 | HR 63 | Temp 98.4°F | Resp 16 | Ht 64.0 in | Wt 306.0 lb

## 2018-11-14 DIAGNOSIS — B9789 Other viral agents as the cause of diseases classified elsewhere: Secondary | ICD-10-CM | POA: Diagnosis not present

## 2018-11-14 DIAGNOSIS — R062 Wheezing: Secondary | ICD-10-CM

## 2018-11-14 DIAGNOSIS — J069 Acute upper respiratory infection, unspecified: Secondary | ICD-10-CM

## 2018-11-14 MED ORDER — ALBUTEROL SULFATE 108 (90 BASE) MCG/ACT IN AEPB: 1.0000 | INHALATION_SPRAY | Freq: Four times a day (QID) | RESPIRATORY_TRACT | 1 refills | Status: DC | PRN

## 2018-11-14 MED ORDER — HYDROCOD POLST-CPM POLST ER 10-8 MG/5ML PO SUER: 5.0000 mL | Freq: Two times a day (BID) | ORAL | 0 refills | Status: DC | PRN

## 2018-11-14 MED FILL — HYDROCODONE-CHLORPHEN ER SU: 10-8 | 14 days supply | Qty: 140 | Fill #0

## 2018-11-14 MED FILL — PROAIR RESPICLICK INHAL PWD: 108 (90 BAS | 25 days supply | Qty: 1 | Fill #0

## 2018-11-14 NOTE — Patient Instructions (Signed)
Please keep well hydrated and get plenty of rest.  Start saline nasal rinse for nasal congestion. Also start plain Mucinex over the counter. Take the prescription cough medication as directed.  Also use the albuterol inhaler as directed if needed for chest tightness or wheeze.  Let me know if symptoms are not easing up in the next few days.    Viral Illness, Adult Viruses are tiny germs that can get into a person's body and cause illness. There are many different types of viruses, and they cause many types of illness. Viral illnesses can range from mild to severe. They can affect various parts of the body. Common illnesses that are caused by a virus include colds and the flu. Viral illnesses also include serious conditions such as HIV/AIDS (human immunodeficiency virus/acquired immunodeficiency syndrome). A few viruses have been linked to certain cancers. What are the causes? Many types of viruses can cause illness. Viruses invade cells in your body, multiply, and cause the infected cells to malfunction or die. When the cell dies, it releases more of the virus. When this happens, you develop symptoms of the illness, and the virus continues to spread to other cells. If the virus takes over the function of the cell, it can cause the cell to divide and grow out of control, as is the case when a virus causes cancer. Different viruses get into the body in different ways. You can get a virus by:  Swallowing food or water that is contaminated with the virus.  Breathing in droplets that have been coughed or sneezed into the air by an infected person.  Touching a surface that has been contaminated with the virus and then touching your eyes, nose, or mouth.  Being bitten by an insect or animal that carries the virus.  Having sexual contact with a person who is infected with the virus.  Being exposed to blood or fluids that contain the virus, either through an open cut or during a transfusion. If a  virus enters your body, your body's defense system (immune system) will try to fight the virus. You may be at higher risk for a viral illness if your immune system is weak. What are the signs or symptoms? Symptoms vary depending on the type of virus and the location of the cells that it invades. Common symptoms of the main types of viral illnesses include: Cold and flu viruses  Fever.  Headache.  Sore throat.  Muscle aches.  Nasal congestion.  Cough. Digestive system (gastrointestinal) viruses  Fever.  Abdominal pain.  Nausea.  Diarrhea. Liver viruses (hepatitis)  Loss of appetite.  Tiredness.  Yellowing of the skin (jaundice). Brain and spinal cord viruses  Fever.  Headache.  Stiff neck.  Nausea and vomiting.  Confusion or sleepiness. Skin viruses  Warts.  Itching.  Rash. Sexually transmitted viruses  Discharge.  Swelling.  Redness.  Rash. How is this treated? Viruses can be difficult to treat because they live within cells. Antibiotic medicines do not treat viruses because these drugs do not get inside cells. Treatment for a viral illness may include:  Resting and drinking plenty of fluids.  Medicines to relieve symptoms. These can include over-the-counter medicine for pain and fever, medicines for cough or congestion, and medicines to relieve diarrhea.  Antiviral medicines. These drugs are available only for certain types of viruses. They may help reduce flu symptoms if taken early. There are also many antiviral medicines for hepatitis and HIV/AIDS. Some viral illnesses can be prevented with  vaccinations. A common example is the flu shot. Follow these instructions at home: Medicines   Take over-the-counter and prescription medicines only as told by your health care provider.  If you were prescribed an antiviral medicine, take it as told by your health care provider. Do not stop taking the medicine even if you start to feel better.  Be  aware of when antibiotics are needed and when they are not needed. Antibiotics do not treat viruses. If your health care provider thinks that you may have a bacterial infection as well as a viral infection, you may get an antibiotic. ? Do not ask for an antibiotic prescription if you have been diagnosed with a viral illness. That will not make your illness go away faster. ? Frequently taking antibiotics when they are not needed can lead to antibiotic resistance. When this develops, the medicine no longer works against the bacteria that it normally fights. General instructions  Drink enough fluids to keep your urine clear or pale yellow.  Rest as much as possible.  Return to your normal activities as told by your health care provider. Ask your health care provider what activities are safe for you.  Keep all follow-up visits as told by your health care provider. This is important. How is this prevented? Take these actions to reduce your risk of viral infection:  Eat a healthy diet and get enough rest.  Wash your hands often with soap and water. This is especially important when you are in public places. If soap and water are not available, use hand sanitizer.  Avoid close contact with friends and family who have a viral illness.  If you travel to areas where viral gastrointestinal infection is common, avoid drinking water or eating raw food.  Keep your immunizations up to date. Get a flu shot every year as told by your health care provider.  Do not share toothbrushes, nail clippers, razors, or needles with other people.  Always practice safe sex.  Contact a health care provider if:  You have symptoms of a viral illness that do not go away.  Your symptoms come back after going away.  Your symptoms get worse. Get help right away if:  You have trouble breathing.  You have a severe headache or a stiff neck.  You have severe vomiting or abdominal pain. This information is not  intended to replace advice given to you by your health care provider. Make sure you discuss any questions you have with your health care provider. Document Released: 02/28/2016 Document Revised: 04/01/2016 Document Reviewed: 02/28/2016 Elsevier Interactive Patient Education  2019 [employer].

## 2018-11-14 NOTE — Progress Notes (Signed)
Patient presents to clinic today c/o 4 days of nasal congestion, chest tightness with occasional wheeze, left-sided ear pressure and chills. Noted initially having a fever with Tmax at 100.2. Denies sore throat, sinus pain, nausea/vomiting. Denies recent travel. Denies sick contact. Notes symptoms are some better today but cough is keeping her from sleep.  Past Medical History:  Diagnosis Date  . Anxiety   . Diabetes mellitus without complication ([place])   . Herpes   . Hypertension   . RLS (restless legs syndrome) 03/17/2017   Right leg    Current Outpatient Medications on File Prior to Visit  Medication Sig Dispense Refill  . ACCU-CHEK FASTCLIX LANCETS MISC USE ONE LANCET TWICE DAILY EACH TIME SUGARS ARE CHECKED. 102 each 12  . ACCU-CHEK GUIDE test strip USE AS INSTRUCTED TO TEST YOUR SUGARS TWICE DAILY. 100 each 12  . acetaminophen (TYLENOL) 325 MG tablet Take 2 tablets (650 mg total) by mouth every 6 (six) hours as needed. 90 tablet 3  . ALL DAY ALLERGY 10 MG tablet TAKE 1 TABLET BY MOUTH ONCE DAILY 30 tablet 11  . atenolol (TENORMIN) 50 MG tablet TAKE 2 TABLETS BY MOUTH AT BEDTIME. 180 tablet 1  . desonide (DESOWEN) 0.05 % cream APPLY TO THE AFFECTED AREA(S) TWICE DAILY 60 g 3  . escitalopram (LEXAPRO) 20 MG tablet TAKE 1 TABLET BY MOUTH ONCE DAILY 30 tablet 3  . fluticasone (FLONASE) 50 MCG/ACT nasal spray PLACE 2 SPRAYS INTO BOTH NOSTRILS DAILY. 16 g 6  . furosemide (LASIX) 20 MG tablet TAKE 1 TABLET BY MOUTH ONCE DAILY 30 tablet 3  . losartan-hydrochlorothiazide (HYZAAR) 100-25 MG tablet TAKE 1 TABLET BY MOUTH DAILY. 90 tablet 1  . metFORMIN (GLUCOPHAGE-XR) 500 MG 24 hr tablet TAKE 1 TABLET BY MOUTH ONCE DAILY WITH BREAKFAST 90 tablet 1  . montelukast (SINGULAIR) 10 MG tablet Take 1 tablet (10 mg total) by mouth at bedtime. 30 tablet 3  . mupirocin ointment (BACTROBAN) 2 % Apply 1 application topically 2 (two) times daily. 30 g 3  . pantoprazole (PROTONIX) 40 MG tablet Take 1  tablet (40 mg total) by mouth daily. 30 tablet 3  . potassium chloride SA (K-DUR,KLOR-CON) 20 MEQ tablet TAKE 2 TABLETS BY MOUTH DAILY 60 tablet 6  . ranitidine (ZANTAC) 300 MG tablet TAKE 1 TABLET BY MOUTH AT BEDTIME. 30 tablet 6  . SSS 10-5 10-5 % FOAM APPLY TO THE AFFECTED AREA ONCE DAILY 60 g 3  . valACYclovir (VALTREX) 1000 MG tablet Take 1 tablet (1,000 mg total) by mouth 2 (two) times daily. (Patient taking differently: Take 1,000 mg by mouth as needed. ) 20 tablet 3  . VENTOLIN HFA 108 (90 Base) MCG/ACT inhaler INHALE 2 PUFFS BY MOUTH INTO THE LUNGS EVERY 6 HOURS AS NEEDED FOR WHEEZING OR SHORTNESS OF BREATH 18 g 2   No current facility-administered medications on file prior to visit.     Allergies  Allergen Reactions  . Penicillins Other (See Comments)    Childhood allergy.    Family History  Problem Relation Age of Onset  . Hypertension Mother   . Stroke Father   . Hypertension Paternal Grandmother   . Diabetes Mellitus II Paternal Grandmother   . Healthy Brother   . Healthy Daughter   . Healthy Son     Social History   Socioeconomic History  . Marital status: Married    Spouse name: Not on file  . Number of children: Not on file  . Years of  education: Not on file  . Highest education level: Not on file  Occupational History  . Not on file  Social Needs  . Financial resource strain: Not on file  . Food insecurity:    Worry: Not on file    Inability: Not on file  . Transportation needs:    Medical: Not on file    Non-medical: Not on file  Tobacco Use  . Smoking status: Former Research scientist (life sciences)  . Smokeless tobacco: Never Used  Substance and Sexual Activity  . Alcohol use: Yes    Alcohol/week: 0.0 standard drinks    Comment: socially  . Drug use: No  . Sexual activity: Yes    Birth control/protection: Surgical  Lifestyle  . Physical activity:    Days per week: Not on file    Minutes per session: Not on file  . Stress: Not on file  Relationships  . Social  connections:    Talks on phone: Not on file    Gets together: Not on file    Attends religious service: Not on file    Active member of club or organization: Not on file    Attends meetings of clubs or organizations: Not on file    Relationship status: Not on file  Other Topics Concern  . Not on file  Social History Narrative   Lives with husband and 2 children in a 2 story home.     Works as a Chartered certified accountant at [employer].     Highest level of education:  High school, in college now   Review of Systems - See HPI.  All other ROS are negative.  BP 122/80   Pulse 63   Temp 98.4 F (36.9 C) (Oral)   Resp 16   Ht 5\' 4"  (1.626 m)   Wt (!) 306 lb (138.8 kg)   LMP 12/04/2011   SpO2 99%   BMI 52.52 kg/m   Physical Exam Vitals signs reviewed.  Constitutional:      Appearance: Normal appearance.  HENT:     Head: Normocephalic and atraumatic.     Right Ear: Tympanic membrane and ear canal normal.     Left Ear: Tympanic membrane and ear canal normal.     Nose: Congestion and rhinorrhea present.     Mouth/Throat:     Mouth: Mucous membranes are moist.  Eyes:     Conjunctiva/sclera: Conjunctivae normal.  Neck:     Musculoskeletal: Neck supple.  Cardiovascular:     Rate and Rhythm: Normal rate and regular rhythm.     Pulses: Normal pulses.     Heart sounds: Normal heart sounds.  Pulmonary:     Effort: Pulmonary effort is normal.     Breath sounds: Wheezing (faint - lung bases bilaterally) present.  Neurological:     General: No focal deficit present.     Mental Status: She is alert and oriented to person, place, and time.  Psychiatric:        Mood and Affect: Mood normal.     Assessment/Plan: 1. Viral URI with cough Supportive measures and OTC medications reviewed. Rx tussionex. Follow-up if symptoms are not continuing to improve. - chlorpheniramine-HYDROcodone (TUSSIONEX PENNKINETIC ER) 10-8 MG/5ML SUER; Take 5 mLs by mouth every 12 (twelve) hours as needed.  Dispense: 140  mL; Refill: 0  2. Wheezing Mild. Restart Albuterol MDI. Continue Singulair daily.  - Albuterol Sulfate (PROAIR RESPICLICK) 629 (90 Base) MCG/ACT AEPB; Inhale 1-2 puffs into the lungs every 6 (six) hours as needed.  Dispense: 1 each; Refill: [place], [place]

## 2018-11-17 ENCOUNTER — Other Ambulatory Visit: Payer: Self-pay

## 2018-11-17 ENCOUNTER — Encounter: Payer: Self-pay | Admitting: Family Medicine

## 2018-11-17 ENCOUNTER — Encounter: Payer: Self-pay | Admitting: Physician Assistant

## 2018-11-17 ENCOUNTER — Ambulatory Visit: Payer: Self-pay

## 2018-11-17 ENCOUNTER — Ambulatory Visit: Payer: 59 | Admitting: Family Medicine

## 2018-11-17 VITALS — BP 126/82 | HR 82 | Temp 98.4°F | Resp 18 | Ht 64.0 in | Wt 308.4 lb

## 2018-11-17 DIAGNOSIS — R059 Cough, unspecified: Secondary | ICD-10-CM

## 2018-11-17 DIAGNOSIS — R05 Cough: Secondary | ICD-10-CM

## 2018-11-17 DIAGNOSIS — J4541 Moderate persistent asthma with (acute) exacerbation: Secondary | ICD-10-CM

## 2018-11-17 MED ORDER — IPRATROPIUM-ALBUTEROL 0.5-2.5 (3) MG/3ML IN SOLN
3.0000 mL | Freq: Once | RESPIRATORY_TRACT | Status: AC
  Administered 2018-11-17: 3 mL via RESPIRATORY_TRACT

## 2018-11-17 MED ORDER — PREDNISONE 10 MG PO TABS: ORAL_TABLET | ORAL | 0 refills | Status: DC

## 2018-11-17 MED FILL — predniSONE 10 MG TABS: 10 | 9 days supply | Qty: 18 | Fill #0

## 2018-11-17 NOTE — Telephone Encounter (Signed)
Pt. Reports she saw Mr. Hassell Done Monday with cough. Reports she feels worse and "the wheezing is worse - I feel like I need a breathing treatment." Denies any fever. Appointment made for today with her provider.  Reason for Disposition . SEVERE coughing spells (e.g., whooping sound after coughing, vomiting after coughing)  Answer Assessment - Initial Assessment Questions 1. ONSET: "When did the cough begin?"      Started last week 2. SEVERITY: "How bad is the cough today?"      Severe 3. RESPIRATORY DISTRESS: "Describe your breathing."      Shortness of breath 4. FEVER: "Do you have a fever?" If so, ask: "What is your temperature, how was it measured, and when did it start?"     No 5. SPUTUM: "Describe the color of your sputum" (clear, white, yellow, green)     Yellow 6. HEMOPTYSIS: "Are you coughing up any blood?" If so ask: "How much?" (flecks, streaks, tablespoons, etc.)     No 7. CARDIAC HISTORY: "Do you have any history of heart disease?" (e.g., heart attack, congestive heart failure)      No 8. LUNG HISTORY: "Do you have any history of lung disease?"  (e.g., pulmonary embolus, asthma, emphysema)     No 9. PE RISK FACTORS: "Do you have a history of blood clots?" (or: recent major surgery, recent prolonged travel, bedridden)     No 10. OTHER SYMPTOMS: "Do you have any other symptoms?" (e.g., runny nose, wheezing, chest pain)       Wheezing 11. PREGNANCY: "Is there any chance you are pregnant?" "When was your last menstrual period?"       No 12. TRAVEL: "Have you traveled out of the country in the last month?" (e.g., travel history, exposures)       No  Protocols used: [place]

## 2018-11-17 NOTE — Progress Notes (Signed)
   Subjective:    Patient ID: Vanessa Reeves, female    DOB: 01-21-1971, 48 y.o.   MRN: [id number]  HPI Cough w/ wheezing- pt was seen Monday 1/14 and dx'd w/ viral URI.  Pt feels breathing is worsening despite using inhaler.  Cough is worse, wheezing is worse.  Unable to catch her breath.  Hydrocodone syrup helps w/ sleep at night.  No fevers.  Cough is now productive of yellow sputum.   Review of Systems For ROS see HPI     Objective:   Physical Exam Vitals signs reviewed.  Constitutional:      General: She is not in acute distress.    Appearance: She is well-developed. She is obese.  HENT:     Head: Normocephalic and atraumatic.  Eyes:     Conjunctiva/sclera: Conjunctivae normal.     Pupils: Pupils are equal, round, and reactive to light.  Neck:     Musculoskeletal: Normal range of motion and neck supple.  Cardiovascular:     Rate and Rhythm: Normal rate and regular rhythm.     Heart sounds: Normal heart sounds. No murmur.  Pulmonary:     Effort: Pulmonary effort is normal. No respiratory distress.     Breath sounds: Wheezing (end expiratory wheezes that improved s/p neb tx) present. No rhonchi.  Lymphadenopathy:     Cervical: No cervical adenopathy.  Skin:    General: Skin is warm and dry.  Neurological:     Mental Status: She is alert.           Assessment & Plan:

## 2018-11-17 NOTE — Patient Instructions (Signed)
Follow up as needed or as scheduled CONTINUE to use the Albuterol inhaler to help w/ cough, wheezing, shortness of breath Drink plenty of water REST! START the Prednisone as directed- 3 pills at the same time x3 days, then 2 pills at the same time x3 days, then 1 pill daily.  Take w/ food Use the cough syrup as needed Call with any questions or concerns Hang in there!

## 2018-11-18 NOTE — Assessment & Plan Note (Signed)
Pt is having an exacerbation triggered by current URI.  Sxs improved considerably after neb tx.  Continue albuterol.  Prednisone taper given.  Reviewed supportive care and red flags that should prompt return.  Pt expressed understanding and is in agreement w/ plan.

## 2018-12-02 ENCOUNTER — Other Ambulatory Visit: Payer: Self-pay | Admitting: Family Medicine

## 2018-12-02 MED FILL — FUROSEMIDE 20 MG TABS: 20 | 30 days supply | Qty: 30 | Fill #0

## 2018-12-02 MED FILL — LOSARTAN-HCTZ 100-25 MG TAB: 100-25 | 30 days supply | Qty: 30 | Fill #4

## 2018-12-02 MED FILL — ESCITALOPRAM 20 MG TABLET: 20 | 30 days supply | Qty: 30 | Fill #0

## 2018-12-05 ENCOUNTER — Other Ambulatory Visit: Payer: Self-pay | Admitting: Family Medicine

## 2018-12-05 NOTE — Telephone Encounter (Signed)
Ok to refill 

## 2018-12-06 ENCOUNTER — Encounter: Payer: Self-pay | Admitting: Family Medicine

## 2018-12-08 ENCOUNTER — Ambulatory Visit: Payer: 59 | Admitting: Family Medicine

## 2018-12-13 MED FILL — SSS 10-5 FOAM: 10-5 | 32 days supply | Qty: 60 | Fill #1

## 2018-12-13 MED FILL — POTASSIUM CHLORIDE CRYS ER: 20 | 30 days supply | Qty: 60 | Fill #5

## 2018-12-15 MED FILL — DESONIDE 0.05 % CREA: 0.05 | 30 days supply | Qty: 60 | Fill #0

## 2018-12-21 ENCOUNTER — Other Ambulatory Visit: Payer: Self-pay

## 2018-12-21 ENCOUNTER — Other Ambulatory Visit (HOSPITAL_COMMUNITY)
Admission: RE | Admit: 2018-12-21 | Discharge: 2018-12-21 | Disposition: A | Discharge status: Home / Self Care | Payer: 59 | Source: Ambulatory Visit | Attending: Family Medicine | Admitting: Family Medicine

## 2018-12-21 ENCOUNTER — Ambulatory Visit: Payer: 59 | Admitting: Family Medicine

## 2018-12-21 ENCOUNTER — Encounter: Payer: Self-pay | Admitting: Family Medicine

## 2018-12-21 VITALS — BP 130/81 | HR 61 | Temp 98.5°F | Resp 16 | Ht 64.0 in | Wt 313.4 lb

## 2018-12-21 DIAGNOSIS — Z202 Contact with and (suspected) exposure to infections with a predominantly sexual mode of transmission: Secondary | ICD-10-CM | POA: Insufficient documentation

## 2018-12-21 DIAGNOSIS — E119 Type 2 diabetes mellitus without complications: Secondary | ICD-10-CM

## 2018-12-21 DIAGNOSIS — N898 Other specified noninflammatory disorders of vagina: Secondary | ICD-10-CM | POA: Insufficient documentation

## 2018-12-21 LAB — POCT URINALYSIS DIPSTICK
Bilirubin, UA: NEGATIVE
Blood, UA: NEGATIVE
GLUCOSE UA: NEGATIVE
Ketones, UA: NEGATIVE
LEUKOCYTES UA: NEGATIVE
Nitrite, UA: NEGATIVE
Protein, UA: NEGATIVE
Spec Grav, UA: 1.01 (ref 1.010–1.025)
Urobilinogen, UA: 0.2 E.U./dL
pH, UA: 5.5 (ref 5.0–8.0)

## 2018-12-21 NOTE — Assessment & Plan Note (Signed)
Chronic problem, pt has not followed up as directed.  She does report changing her diet and starting to exercise.  Applauded her efforts.  UTD on foot exam, on ARB.  Due for eye exam- scheduled for next week.  Check labs.  Adjust meds prn

## 2018-12-21 NOTE — Patient Instructions (Signed)
Follow up in 3-4 months to recheck diabetes We'll notify you of your lab results and make any changes if needed Continue to work on healthy diet and regular exercise- you can do it! Please have your eye doctor send me their report Call with any questions or concerns Have a great day!

## 2018-12-21 NOTE — Assessment & Plan Note (Signed)
Ongoing issue for pt.  Stressed need for healthy diet and regular exercise.  Will continue to follow. 

## 2018-12-21 NOTE — Assessment & Plan Note (Signed)
Check labs at pt's request

## 2018-12-21 NOTE — Addendum Note (Signed)
Addended by: Midge Minium on: 12/21/2018 03:45 PM   Modules accepted: Orders

## 2018-12-21 NOTE — Progress Notes (Signed)
   Subjective:    Patient ID: Vanessa Reeves, female    DOB: 1971/01/04, 48 y.o.   MRN: [id number]  HPI Vaginal concerns- 'I think the BV is back'.  + odor, clear discharge, mild itching.  Desires STD check.  No particular concerns but wants to be safe.  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  She is wondering if she could give the virus to a partner if sexually active.  DM- chronic problem, on Metformin.  On ARB.  UTD on foot exam, due for eye exam (schedule for next Thursday).    Obesity- has been on [employer] since last year.  Pt has gained 5 lbs.  She has eliminated soda, cut out junk food, is exercising at the gym.   Review of Systems For ROS see HPI     Objective:   Physical Exam Vitals signs reviewed.  Constitutional:      General: She is not in acute distress.    Appearance: She is well-developed. She is obese.  HENT:     Head: Normocephalic and atraumatic.  Eyes:     Conjunctiva/sclera: Conjunctivae normal.     Pupils: Pupils are equal, round, and reactive to light.  Neck:     Musculoskeletal: Normal range of motion and neck supple.     Thyroid: No thyromegaly.  Cardiovascular:     Rate and Rhythm: Normal rate and regular rhythm.     Heart sounds: Normal heart sounds. No murmur.  Pulmonary:     Effort: Pulmonary effort is normal. No respiratory distress.     Breath sounds: Normal breath sounds.  Abdominal:     General: There is no distension.     Palpations: Abdomen is soft.     Tenderness: There is no abdominal tenderness.  Lymphadenopathy:     Cervical: No cervical adenopathy.  Skin:    General: Skin is warm and dry.  Neurological:     Mental Status: She is alert and oriented to person, place, and time.  Psychiatric:        Behavior: Behavior normal.           Assessment & Plan:  Vaginal odor- check urine cytology and tx any abnormalities if present.  Pt expressed understanding and is in agreement w/ plan.

## 2018-12-22 ENCOUNTER — Other Ambulatory Visit: Payer: Self-pay | Admitting: General Practice

## 2018-12-22 ENCOUNTER — Encounter: Payer: Self-pay | Admitting: Family Medicine

## 2018-12-22 LAB — CBC WITH DIFFERENTIAL/PLATELET
BASOS ABS: 0 10*3/uL (ref 0.0–0.1)
Basophils Relative: 0.3 % (ref 0.0–3.0)
EOS ABS: 0.1 10*3/uL (ref 0.0–0.7)
Eosinophils Relative: 1.8 % (ref 0.0–5.0)
HEMATOCRIT: 35.1 % — AB (ref 36.0–46.0)
HEMOGLOBIN: 11.3 g/dL — AB (ref 12.0–15.0)
LYMPHS PCT: 37.4 % (ref 12.0–46.0)
Lymphs Abs: 3.1 10*3/uL (ref 0.7–4.0)
MCHC: 32.3 g/dL (ref 30.0–36.0)
MCV: 86.7 fl (ref 78.0–100.0)
MONOS PCT: 7.9 % (ref 3.0–12.0)
Monocytes Absolute: 0.7 10*3/uL (ref 0.1–1.0)
NEUTROS ABS: 4.3 10*3/uL (ref 1.4–7.7)
Neutrophils Relative %: 52.6 % (ref 43.0–77.0)
PLATELETS: 434 10*3/uL — AB (ref 150.0–400.0)
RBC: 4.05 Mil/uL (ref 3.87–5.11)
RDW: 16.4 % — ABNORMAL HIGH (ref 11.5–15.5)
WBC: 8.3 10*3/uL (ref 4.0–10.5)

## 2018-12-22 LAB — RPR: RPR Ser Ql: NONREACTIVE

## 2018-12-22 LAB — HEPATIC FUNCTION PANEL
ALBUMIN: 3.7 g/dL (ref 3.5–5.2)
ALK PHOS: 74 U/L (ref 39–117)
ALT: 13 U/L (ref 0–35)
AST: 12 U/L (ref 0–37)
Bilirubin, Direct: 0 mg/dL (ref 0.0–0.3)
TOTAL PROTEIN: 7.3 g/dL (ref 6.0–8.3)
Total Bilirubin: 0.2 mg/dL (ref 0.2–1.2)

## 2018-12-22 LAB — BASIC METABOLIC PANEL
BUN: 18 mg/dL (ref 6–23)
CALCIUM: 9.7 mg/dL (ref 8.4–10.5)
CO2: 33 meq/L — AB (ref 19–32)
CREATININE: 0.9 mg/dL (ref 0.40–1.20)
Chloride: 99 mEq/L (ref 96–112)
GFR: 81.5 mL/min (ref >60.00)
GLUCOSE: 134 mg/dL — AB (ref 70–99)
Potassium: 3.8 mEq/L (ref 3.5–5.1)
SODIUM: 142 meq/L (ref 135–145)

## 2018-12-22 LAB — HIV ANTIBODY (ROUTINE TESTING W REFLEX): HIV 1&2 Ab, 4th Generation: NONREACTIVE

## 2018-12-22 LAB — HEMOGLOBIN A1C: Hgb A1c MFr Bld: 7 % — ABNORMAL HIGH (ref 4.6–6.5)

## 2018-12-22 LAB — LIPID PANEL
CHOLESTEROL: 140 mg/dL (ref 0–200)
HDL: 48.8 mg/dL (ref >39.00)
LDL CALC: 71 mg/dL (ref 0–99)
NonHDL: 91.03
TRIGLYCERIDES: 101 mg/dL (ref 0.0–149.0)
Total CHOL/HDL Ratio: 3
VLDL: 20.2 mg/dL (ref 0.0–40.0)

## 2018-12-22 LAB — TSH: TSH: 1.51 u[IU]/mL (ref 0.35–4.50)

## 2018-12-22 MED ORDER — METFORMIN HCL ER 500 MG PO TB24: ORAL_TABLET | ORAL | 1 refills | Status: DC

## 2018-12-23 LAB — URINE CYTOLOGY ANCILLARY ONLY
BACTERIAL VAGINITIS: NEGATIVE
Candida vaginitis: NEGATIVE
Chlamydia: NEGATIVE
Neisseria Gonorrhea: NEGATIVE
Trichomonas: NEGATIVE

## 2018-12-27 ENCOUNTER — Encounter: Payer: Self-pay | Admitting: Family Medicine

## 2018-12-28 NOTE — Telephone Encounter (Signed)
Unable to [facility] instructing patient to return for lab only visit, voicemail box was full.

## 2018-12-29 ENCOUNTER — Other Ambulatory Visit: Payer: 59

## 2018-12-29 DIAGNOSIS — H524 Presbyopia: Secondary | ICD-10-CM | POA: Diagnosis not present

## 2018-12-29 DIAGNOSIS — E119 Type 2 diabetes mellitus without complications: Secondary | ICD-10-CM | POA: Diagnosis not present

## 2018-12-29 DIAGNOSIS — Z202 Contact with and (suspected) exposure to infections with a predominantly sexual mode of transmission: Secondary | ICD-10-CM | POA: Diagnosis not present

## 2018-12-29 DIAGNOSIS — H52223 Regular astigmatism, bilateral: Secondary | ICD-10-CM | POA: Diagnosis not present

## 2018-12-29 DIAGNOSIS — H5213 Myopia, bilateral: Secondary | ICD-10-CM | POA: Diagnosis not present

## 2018-12-31 LAB — HSV(HERPES SMPLX)ABS-I+II(IGG+IGM)-BLD
HSV 2 IgG, Type Spec: 10.1 index — ABNORMAL HIGH (ref 0.00–0.90)
HSVI/II Comb IgM: <0.91 Ratio (ref 0.00–0.90)

## 2019-01-01 ENCOUNTER — Encounter: Payer: Self-pay | Admitting: Family Medicine

## 2019-01-09 MED FILL — FUROSEMIDE 20 MG TABS: 20 | 90 days supply | Qty: 90 | Fill #1

## 2019-01-09 MED FILL — SSS 10-5 FOAM: 10-5 | 30 days supply | Qty: 60 | Fill #2

## 2019-01-09 MED FILL — HYDROCHLOROTHIAZIDE 25 MG T: 25 | 30 days supply | Qty: 30 | Fill #0

## 2019-01-09 MED FILL — LOSARTAN POTASSIUM 50 MG TA: 50 | 30 days supply | Qty: 60 | Fill #0

## 2019-01-16 MED FILL — ESCITALOPRAM 20 MG TABLET: 20 | 30 days supply | Qty: 30 | Fill #1

## 2019-01-25 ENCOUNTER — Other Ambulatory Visit: Payer: Self-pay | Admitting: Family Medicine

## 2019-01-25 MED FILL — HYDROCHLOROTHIAZIDE 25 MG T: 25 | 30 days supply | Qty: 30 | Fill #0

## 2019-01-25 MED FILL — ACCU-CHEK FASTCLIX LANCETS: 51 days supply | Qty: 102 | Fill #2

## 2019-01-25 MED FILL — ACCU-CHEK GUIDE TEST STRIP: 50 days supply | Qty: 100 | Fill #2

## 2019-01-25 MED FILL — LOSARTAN POTASSIUM 50 MG TA: 50 | 30 days supply | Qty: 60 | Fill #0

## 2019-02-09 ENCOUNTER — Other Ambulatory Visit: Payer: Self-pay | Admitting: Family Medicine

## 2019-02-09 MED FILL — metFORMIN HCL ER 500 MG TB2: 500 | 90 days supply | Qty: 180 | Fill #0

## 2019-02-09 MED FILL — SSS 10-5 FOAM: 10-5 | 30 days supply | Qty: 60 | Fill #3

## 2019-02-09 MED FILL — POTASSIUM CHLORIDE CRYS ER: 20 | 30 days supply | Qty: 60 | Fill #0

## 2019-03-01 ENCOUNTER — Other Ambulatory Visit: Payer: Self-pay | Admitting: Family Medicine

## 2019-03-01 MED FILL — ATENOLOL 50 MG TABLET: 50 | 90 days supply | Qty: 180 | Fill #0

## 2019-03-17 ENCOUNTER — Other Ambulatory Visit: Payer: Self-pay | Admitting: Family Medicine

## 2019-03-17 MED FILL — SSS 10-5 FOAM: 10-5 | 30 days supply | Qty: 60 | Fill #0

## 2019-03-17 MED FILL — ESCITALOPRAM 20 MG TABLET: 20 | 30 days supply | Qty: 30 | Fill #2

## 2019-03-28 ENCOUNTER — Other Ambulatory Visit: Payer: Self-pay | Admitting: Family Medicine

## 2019-03-28 MED FILL — LOSARTAN POTASSIUM 50 MG TA: 50 | 30 days supply | Qty: 60 | Fill #0

## 2019-04-10 ENCOUNTER — Other Ambulatory Visit: Payer: Self-pay | Admitting: Family Medicine

## 2019-04-10 MED FILL — ACCU-CHEK FASTCLIX LANCETS: 51 days supply | Qty: 102 | Fill #3

## 2019-04-10 MED FILL — ACCU-CHEK GUIDE TEST STRIP: 50 days supply | Qty: 100 | Fill #3

## 2019-04-10 MED FILL — FUROSEMIDE 20 MG TABS: 20 | 30 days supply | Qty: 30 | Fill #0

## 2019-04-10 MED FILL — DESONIDE 0.05 % CREA: 0.05 | 30 days supply | Qty: 60 | Fill #1

## 2019-04-10 MED FILL — PROAIR RESPICLICK INHAL PWD: 108 (90 BAS | 25 days supply | Qty: 1 | Fill #1

## 2019-04-10 MED FILL — HYDROCHLOROTHIAZIDE 25 MG T: 25 | 30 days supply | Qty: 30 | Fill #0

## 2019-04-21 ENCOUNTER — Encounter: Payer: Self-pay | Admitting: Family Medicine

## 2019-04-24 MED FILL — SSS 10-5 FOAM: 10-5 | 30 days supply | Qty: 60 | Fill #1

## 2019-04-24 MED FILL — POTASSIUM CHLORIDE CRYS ER: 20 | 30 days supply | Qty: 60 | Fill #1

## 2019-04-24 MED FILL — ESCITALOPRAM 20 MG TABLET: 20 | 30 days supply | Qty: 30 | Fill #3

## 2019-05-03 ENCOUNTER — Other Ambulatory Visit: Payer: Self-pay | Admitting: Family Medicine

## 2019-05-04 MED FILL — FUROSEMIDE 20 MG TABS: 20 | 90 days supply | Qty: 90 | Fill #1

## 2019-05-04 MED FILL — LOSARTAN POTASSIUM 50 MG TA: 50 | 30 days supply | Qty: 60 | Fill #0

## 2019-05-22 MED FILL — SSS 10-5 FOAM: 10-5 | 30 days supply | Qty: 60 | Fill #2

## 2019-05-23 MED FILL — TRIAZOLAM 0.25 MG TABLET: 0.25 | 1 days supply | Qty: 2 | Fill #0

## 2019-06-07 ENCOUNTER — Other Ambulatory Visit: Payer: Self-pay | Admitting: Family Medicine

## 2019-06-07 MED FILL — LOSARTAN POTASSIUM 50 MG TA: 50 | 30 days supply | Qty: 60 | Fill #0

## 2019-06-07 MED FILL — ESCITALOPRAM 20 MG TABLET: 20 | 30 days supply | Qty: 30 | Fill #0

## 2019-06-07 MED FILL — HYDROCHLOROTHIAZIDE 25 MG T: 25 | 30 days supply | Qty: 30 | Fill #0

## 2019-06-07 MED FILL — METFORMIN HCL ER 500 MG TB2: 500 | 30 days supply | Qty: 60 | Fill #1

## 2019-06-14 ENCOUNTER — Ambulatory Visit (INDEPENDENT_AMBULATORY_CARE_PROVIDER_SITE_OTHER): Payer: 59 | Admitting: Family Medicine

## 2019-06-14 ENCOUNTER — Other Ambulatory Visit: Payer: Self-pay

## 2019-06-14 ENCOUNTER — Encounter: Payer: Self-pay | Admitting: Family Medicine

## 2019-06-14 VITALS — BP 137/82 | HR 68 | Ht 64.0 in | Wt 304.1 lb

## 2019-06-14 DIAGNOSIS — R5383 Other fatigue: Secondary | ICD-10-CM | POA: Diagnosis not present

## 2019-06-14 DIAGNOSIS — E119 Type 2 diabetes mellitus without complications: Secondary | ICD-10-CM

## 2019-06-14 DIAGNOSIS — I10 Essential (primary) hypertension: Secondary | ICD-10-CM | POA: Diagnosis not present

## 2019-06-14 DIAGNOSIS — E1149 Type 2 diabetes mellitus with other diabetic neurological complication: Secondary | ICD-10-CM | POA: Insufficient documentation

## 2019-06-14 DIAGNOSIS — E0842 Diabetes mellitus due to underlying condition with diabetic polyneuropathy: Secondary | ICD-10-CM

## 2019-06-14 NOTE — Progress Notes (Signed)
Virtual Visit via Video   I connected with patient on 06/14/19 at  4:15 PM EDT by a video enabled telemedicine application and verified that I am speaking with the correct person using two identifiers.  Location patient: Home Location provider: [facility]up[facility]ist, Office Persons participating in the virtual visit: Patient, Provider, [place] (Jess B)  I discussed the limitations of evaluation and management by telemedicine and the availability of in person appointments. The patient expressed understanding and agreed to proceed.  Subjective:   HPI:   DM- chronic problem, on Metformin XR 1000mg  daily.  Overdue for A1C.  UTD on eye exam- Feb WNL ([facility], [place]).  On ARB for renal protection.  Due for foot exam.  Pt reports ~1 month ago she passed out due to low sugar.  Had only had a small bowl of cereal that AM.  Home CBG today 137.  Denies current numbness/tingling of hands/feet.  HTN- chronic problem, on Atenolol 100mg  QHS, Lasix 20mg  daily, Losartan 100mg  daily, HCZT 25mg  daily w/ adequate BP control.  No CP, SOB, HAs, visual changes, edema.  Fatigue- pt reports since her hypoglycemic episode 1 month ago she has had excessive fatigue.  Hx of iron deficiency anemia.  This feels similar.  Denies abnormal bleeding or bruising.  ROS:   See pertinent positives and negatives per HPI.  Patient Active Problem List   Diagnosis Date Noted  . HTN (hypertension) 07/17/2013    Priority: Medium  . Visit for preventive health examination 04/11/2018  . Breast cancer screening 04/11/2018  . Memory changes 11/23/2017  . RLS (restless legs syndrome) 03/17/2017  . 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 Reactive airway disease 08/21/2016  . Pain in right lower leg 04/06/2016  . Gastroesophageal reflux disease without esophagitis 12/20/2015  . Possible exposure to STD 06/07/2015  . Physical exam 12/14/2014  . Urinary frequency 03/30/2014  .  Breast mass, left 02/16/2014  . Edema 02/16/2014  . Chest pain 12/29/2013  . Hypokalemia 08/28/2013  . Diabetes ([place]) 08/28/2013  . Depression with anxiety 07/17/2013  . Eustachian tube dysfunction 07/17/2013  . Severe obesity (BMI >= 40) ([place]) 07/17/2013    Social History   Tobacco Use  . Smoking status: Former Research scientist (life sciences)  . Smokeless tobacco: Never Used  Substance Use Topics  . Alcohol use: Yes    Alcohol/week: 0.0 standard drinks    Comment: socially    Current Outpatient Medications:  .  ACCU-CHEK FASTCLIX LANCETS MISC, USE ONE LANCET TWICE DAILY EACH TIME SUGARS ARE CHECKED., Disp: 102 each, Rfl: 12 .  ACCU-CHEK GUIDE test strip, USE AS INSTRUCTED TO TEST YOUR SUGARS TWICE DAILY., Disp: 100 each, Rfl: 12 .  acetaminophen (TYLENOL) 325 MG tablet, Take 2 tablets (650 mg total) by mouth every 6 (six) hours as needed., Disp: 90 tablet, Rfl: 3 .  Albuterol Sulfate (PROAIR RESPICLICK) 270 (90 Base) MCG/ACT AEPB, Inhale 1-2 puffs into the lungs every 6 (six) hours as needed., Disp: 1 each, Rfl: 1 .  ALL DAY ALLERGY 10 MG tablet, TAKE 1 TABLET BY MOUTH ONCE DAILY, Disp: 30 tablet, Rfl: 11 .  atenolol (TENORMIN) 50 MG tablet, TAKE 2 TABLETS BY MOUTH AT BEDTIME., Disp: 180 tablet, Rfl: 1 .  desonide (DESOWEN) 0.05 % cream, APPLY TO THE AFFECTED AREA(S) TWICE DAILY, Disp: 60 g, Rfl: 3 .  escitalopram (LEXAPRO) 20 MG tablet, Take 1 tablet (20 mg total) by mouth daily. Please schedule follow up appt with Dr. Birdie Riddle for further refills.,  Disp: 30 tablet, Rfl: 0 .  fluticasone (FLONASE) 50 MCG/ACT nasal spray, PLACE 2 SPRAYS INTO BOTH NOSTRILS DAILY., Disp: 16 g, Rfl: 6 .  furosemide (LASIX) 20 MG tablet, TAKE 1 TABLET BY MOUTH ONCE A DAY, Disp: 30 tablet, Rfl: 3 .  hydrochlorothiazide (HYDRODIURIL) 25 MG tablet, Take 1 tablet (25 mg total) by mouth daily. Please schedule follow up appt with Dr. Birdie Riddle for further refills., Disp: 30 tablet, Rfl: 0 .  losartan (COZAAR) 50 MG tablet, Take 2 tablets  (100 mg total) by mouth daily. Please schedule follow up appt with Dr. Birdie Riddle for further refills., Disp: 60 tablet, Rfl: 0 .  losartan-hydrochlorothiazide (HYZAAR) 100-25 MG tablet, TAKE 1 TABLET BY MOUTH DAILY., Disp: 90 tablet, Rfl: 1 .  metFORMIN (GLUCOPHAGE-XR) 500 MG 24 hr tablet, TAKE 1 TABLET BY MOUTH TWICE DAILY WITH BREAKFAST, Disp: 180 tablet, Rfl: 1 .  montelukast (SINGULAIR) 10 MG tablet, Take 1 tablet (10 mg total) by mouth at bedtime., Disp: 30 tablet, Rfl: 3 .  mupirocin ointment (BACTROBAN) 2 %, Apply 1 application topically 2 (two) times daily., Disp: 30 g, Rfl: 3 .  pantoprazole (PROTONIX) 40 MG tablet, Take 1 tablet (40 mg total) by mouth daily., Disp: 30 tablet, Rfl: 3 .  potassium chloride SA (K-DUR,KLOR-CON) 20 MEQ tablet, TAKE 2 TABLETS BY MOUTH DAILY, Disp: 60 tablet, Rfl: 6 .  ranitidine (ZANTAC) 300 MG tablet, TAKE 1 TABLET BY MOUTH AT BEDTIME., Disp: 30 tablet, Rfl: 6 .  SSS 10-5 10-5 % FOAM, APPLY TO THE AFFECTED AREA ONCE DAILY, Disp: 60 g, Rfl: 3 .  valACYclovir (VALTREX) 1000 MG tablet, Take 1 tablet (1,000 mg total) by mouth 2 (two) times daily. (Patient taking differently: Take 1,000 mg by mouth as needed. ), Disp: 20 tablet, Rfl: 3  Allergies  Allergen Reactions  . Penicillins Other (See Comments)    Childhood allergy.    Objective:   BP 137/82   Pulse 68   Ht 5\' 4"  (1.626 m)   Wt (!) 304 lb 2 oz (138 kg)   LMP 12/04/2011   BMI 52.20 kg/m   AAOx3, NAD NCAT, EOMI No obvious CN deficits Coloring WNL Pt is able to speak clearly, coherently without shortness of breath or increased work of breathing.  Thought process is linear.  Mood is appropriate.   Assessment and Plan:   DM- chronic problem.  UTD on eye exam, on ARB for renal protection.  Due for foot exam.  She has not been compliant w/ follow up.  Did have a hypoglycemic episode that resulted in passing out.  Check labs.  Adjust meds prn   HTN- chronic problem.  Adequate control.  Asymptomatic  w/ exception of fatigue.  Check labs.  No anticipated med changes.  Fatigue- recurrent problem.  Pt has hx of iron deficiency.  Check labs and tx abnormalities if present.   Annye Asa, MD 06/14/2019

## 2019-06-14 NOTE — Progress Notes (Signed)
I have discussed the procedure for the virtual visit with the patient who has given consent to proceed with assessment and treatment.   Jessica L Brodmerkel, CMA     

## 2019-06-15 ENCOUNTER — Ambulatory Visit: Payer: 59

## 2019-06-15 MED FILL — SSS 10-5 FOAM: 10-5 | 30 days supply | Qty: 60 | Fill #3

## 2019-06-16 ENCOUNTER — Other Ambulatory Visit: Payer: Self-pay | Admitting: General Practice

## 2019-06-19 ENCOUNTER — Ambulatory Visit (INDEPENDENT_AMBULATORY_CARE_PROVIDER_SITE_OTHER): Payer: 59

## 2019-06-19 ENCOUNTER — Other Ambulatory Visit: Payer: Self-pay

## 2019-06-19 DIAGNOSIS — I10 Essential (primary) hypertension: Secondary | ICD-10-CM | POA: Diagnosis not present

## 2019-06-19 DIAGNOSIS — R5383 Other fatigue: Secondary | ICD-10-CM | POA: Diagnosis not present

## 2019-06-19 DIAGNOSIS — E0842 Diabetes mellitus due to underlying condition with diabetic polyneuropathy: Secondary | ICD-10-CM

## 2019-06-20 LAB — CBC WITH DIFFERENTIAL/PLATELET
Basophils Absolute: 0.1 10*3/uL (ref 0.0–0.1)
Basophils Relative: 0.7 % (ref 0.0–3.0)
Eosinophils Absolute: 0.2 10*3/uL (ref 0.0–0.7)
Eosinophils Relative: 1.9 % (ref 0.0–5.0)
HCT: 35.5 % — ABNORMAL LOW (ref 36.0–46.0)
Hemoglobin: 11.6 g/dL — ABNORMAL LOW (ref 12.0–15.0)
Lymphocytes Relative: 41.9 % (ref 12.0–46.0)
Lymphs Abs: 3.7 10*3/uL (ref 0.7–4.0)
MCHC: 32.8 g/dL (ref 30.0–36.0)
MCV: 85.3 fl (ref 78.0–100.0)
Monocytes Absolute: 0.6 10*3/uL (ref 0.1–1.0)
Monocytes Relative: 6.9 % (ref 3.0–12.0)
Neutro Abs: 4.3 10*3/uL (ref 1.4–7.7)
Neutrophils Relative %: 48.6 % (ref 43.0–77.0)
Platelets: 437 10*3/uL — ABNORMAL HIGH (ref 150.0–400.0)
RBC: 4.16 Mil/uL (ref 3.87–5.11)
RDW: 16.4 % — ABNORMAL HIGH (ref 11.5–15.5)
WBC: 8.8 10*3/uL (ref 4.0–10.5)

## 2019-06-20 LAB — LIPID PANEL
Cholesterol: 165 mg/dL (ref 0–200)
HDL: 49.2 mg/dL (ref >39.00)
LDL Cholesterol: 96 mg/dL (ref 0–99)
NonHDL: 115.66
Total CHOL/HDL Ratio: 3
Triglycerides: 98 mg/dL (ref 0.0–149.0)
VLDL: 19.6 mg/dL (ref 0.0–40.0)

## 2019-06-20 LAB — BASIC METABOLIC PANEL WITH GFR
BUN: 10 mg/dL (ref 6–23)
CO2: 33 meq/L — ABNORMAL HIGH (ref 19–32)
Calcium: 9.7 mg/dL (ref 8.4–10.5)
Chloride: 95 meq/L — ABNORMAL LOW (ref 96–112)
Creatinine, Ser: 0.97 mg/dL (ref 0.40–1.20)
GFR: 74.59 mL/min
Glucose, Bld: 99 mg/dL (ref 70–99)
Potassium: 3.3 meq/L — ABNORMAL LOW (ref 3.5–5.1)
Sodium: 138 meq/L (ref 135–145)

## 2019-06-20 LAB — HEPATIC FUNCTION PANEL
ALT: 12 U/L (ref 0–35)
AST: 13 U/L (ref 0–37)
Albumin: 4 g/dL (ref 3.5–5.2)
Alkaline Phosphatase: 72 U/L (ref 39–117)
Bilirubin, Direct: 0.1 mg/dL (ref 0.0–0.3)
Total Bilirubin: 0.3 mg/dL (ref 0.2–1.2)
Total Protein: 7.8 g/dL (ref 6.0–8.3)

## 2019-06-20 LAB — IRON,TIBC AND FERRITIN PANEL
%SAT: 15 % (calc) — ABNORMAL LOW (ref 16–45)
Ferritin: 150 ng/mL (ref 16–232)
Iron: 45 ug/dL (ref 40–190)
TIBC: 294 mcg/dL (calc) (ref 250–450)

## 2019-06-20 LAB — TSH: TSH: 2.18 u[IU]/mL (ref 0.35–4.50)

## 2019-06-20 LAB — HEMOGLOBIN A1C: Hgb A1c MFr Bld: 6.9 % — ABNORMAL HIGH (ref 4.6–6.5)

## 2019-06-29 ENCOUNTER — Telehealth: Payer: Self-pay | Admitting: *Deleted

## 2019-06-29 DIAGNOSIS — B9689 Other specified bacterial agents as the cause of diseases classified elsewhere: Secondary | ICD-10-CM

## 2019-06-29 DIAGNOSIS — Z111 Encounter for screening for respiratory tuberculosis: Secondary | ICD-10-CM

## 2019-06-29 NOTE — Telephone Encounter (Signed)
Patient notified of PCP recommendations and is agreement and expresses an understanding. Labs ordered and scheduled.  [place] for [facility] to Discuss results / PCP recommendations / Schedule patient.

## 2019-06-29 NOTE — Telephone Encounter (Signed)
Patient is needing a TB QuantiFERON for employement - asking if this can be ordered. Pt states that she also just had BV and is asking if she can leave a urine sample to make sure it is clear.

## 2019-06-29 NOTE — Telephone Encounter (Signed)
[place] for [facility] for TB screen Ok for urine cytology for BV

## 2019-06-29 NOTE — Addendum Note (Signed)
Addended by: Davis Gourd on: 06/29/2019 03:37 PM   Modules accepted: Orders

## 2019-07-03 ENCOUNTER — Other Ambulatory Visit: Payer: Self-pay

## 2019-07-03 ENCOUNTER — Ambulatory Visit (INDEPENDENT_AMBULATORY_CARE_PROVIDER_SITE_OTHER): Payer: 59

## 2019-07-03 ENCOUNTER — Other Ambulatory Visit (HOSPITAL_COMMUNITY)
Admission: RE | Admit: 2019-07-03 | Discharge: 2019-07-03 | Disposition: A | Discharge status: Home / Self Care | Payer: 59 | Source: Ambulatory Visit | Attending: Family Medicine | Admitting: Family Medicine

## 2019-07-03 DIAGNOSIS — N760 Acute vaginitis: Secondary | ICD-10-CM | POA: Diagnosis not present

## 2019-07-03 DIAGNOSIS — Z23 Encounter for immunization: Secondary | ICD-10-CM | POA: Diagnosis not present

## 2019-07-03 DIAGNOSIS — B9689 Other specified bacterial agents as the cause of diseases classified elsewhere: Secondary | ICD-10-CM | POA: Insufficient documentation

## 2019-07-03 DIAGNOSIS — Z111 Encounter for screening for respiratory tuberculosis: Secondary | ICD-10-CM | POA: Diagnosis not present

## 2019-07-05 LAB — QUANTIFERON-TB GOLD PLUS
Mitogen-NIL: >10 IU/mL
NIL: 0.08 IU/mL
QuantiFERON-TB Gold Plus: NEGATIVE
TB1-NIL: 0 IU/mL
TB2-NIL: 0.01 IU/mL

## 2019-07-05 LAB — URINE CYTOLOGY ANCILLARY ONLY: Bacterial vaginitis: NEGATIVE

## 2019-07-06 ENCOUNTER — Ambulatory Visit: Payer: 59 | Admitting: Physician Assistant

## 2019-07-11 ENCOUNTER — Other Ambulatory Visit: Payer: Self-pay | Admitting: Family Medicine

## 2019-07-11 MED FILL — ESCITALOPRAM 20 MG TABLET: 20 | 30 days supply | Qty: 30 | Fill #0

## 2019-07-11 MED FILL — POTASSIUM CHLORIDE CRYS ER: 20 | 30 days supply | Qty: 60 | Fill #2

## 2019-07-11 MED FILL — ATENOLOL 50 MG TABLET: 50 | 90 days supply | Qty: 180 | Fill #1

## 2019-07-11 MED FILL — metFORMIN HCL ER 500 MG TB2: 500 | 30 days supply | Qty: 60 | Fill #2

## 2019-07-11 MED FILL — HYDROCHLOROTHIAZIDE 25 MG T: 25 | 30 days supply | Qty: 30 | Fill #0

## 2019-07-12 ENCOUNTER — Encounter: Payer: Self-pay | Admitting: Physician Assistant

## 2019-07-12 ENCOUNTER — Other Ambulatory Visit: Payer: Self-pay

## 2019-07-12 ENCOUNTER — Ambulatory Visit (INDEPENDENT_AMBULATORY_CARE_PROVIDER_SITE_OTHER): Payer: 59 | Admitting: Physician Assistant

## 2019-07-12 VITALS — BP 130/82 | HR 78 | Temp 98.2°F | Wt 297.5 lb

## 2019-07-12 DIAGNOSIS — B373 Candidiasis of vulva and vagina: Secondary | ICD-10-CM

## 2019-07-12 DIAGNOSIS — B3731 Acute candidiasis of vulva and vagina: Secondary | ICD-10-CM

## 2019-07-12 MED ORDER — FLUCONAZOLE 150 MG PO TABS: 150.0000 mg | ORAL_TABLET | Freq: Every day | ORAL | 0 refills | Status: DC

## 2019-07-12 MED FILL — FLUCONAZOLE 150 MG TABS: 150 | 1 days supply | Qty: 1 | Fill #0

## 2019-07-12 NOTE — Progress Notes (Signed)
I have discussed the procedure for the virtual visit with the patient who has given consent to proceed with assessment and treatment.   Martyna Thorns S Lajuanna Pompa, CMA     

## 2019-07-12 NOTE — Patient Instructions (Signed)
Instructions sent to MyChart.   Please continue avoidance of any scented or dyed soaps to the private areas as these can cause irritation and throw off the natural flora resulting in yeast or BV.  All you need is warm, clean water and a small amount of soap (Dove or Cetaphil)  Take the Diflucan as directed. If symptoms are not resolving we will need to assess in office.   Take care!

## 2019-07-12 NOTE — Progress Notes (Signed)
Virtual Visit via Video   I connected with patient on 07/12/19 at  4:15 PM EDT by a video enabled telemedicine application and verified that I am speaking with the correct person using two identifiers.  Location patient: Home Location provider: [facility], Office Persons participating in the virtual visit: Patient, Provider, [place] (Patina Moore)  I discussed the limitations of evaluation and management by telemedicine and the availability of in person appointments. The patient expressed understanding and agreed to proceed.  Subjective:   HPI:   Patient presents via [facility] today c/o 2 weeks of vaginal itching now with a thick white discharge. Notes symptoms began after switching soaps. Has gone back to her unscented Dove soap. Denies vaginal pain, dysuria, hematuria, frequency or urgency. Denies concern for STI.  Did start OTC Monistat as directed by PCP with some initial improvement in symptoms but worsened after completion of medication. Had recent negative testing for BV. Yeast was not checked for at that time per EMR review.   ROS:   See pertinent positives and negatives per HPI.  Patient Active Problem List   Diagnosis Date Noted  . Diabetic polyneuropathy associated with diabetes mellitus due to underlying condition ([place]) 06/14/2019  . Visit for preventive health examination 04/11/2018  . Breast cancer screening 04/11/2018  . Memory changes 11/23/2017  . RLS (restless legs syndrome) 03/17/2017  . 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 Reactive airway disease 08/21/2016  . Pain in right lower leg 04/06/2016  . Gastroesophageal reflux disease without esophagitis 12/20/2015  . Possible exposure to STD 06/07/2015  . Physical exam 12/14/2014  . Urinary frequency 03/30/2014  . Breast mass, left 02/16/2014  . Edema 02/16/2014  . Chest pain 12/29/2013  . Hypokalemia 08/28/2013  . Depression with anxiety 07/17/2013  . HTN  (hypertension) 07/17/2013  . Eustachian tube dysfunction 07/17/2013  . Severe obesity (BMI >= 40) ([place]) 07/17/2013    Social History   Tobacco Use  . Smoking status: Former Research scientist (life sciences)  . Smokeless tobacco: Never Used  Substance Use Topics  . Alcohol use: Yes    Alcohol/week: 0.0 standard drinks    Comment: socially    Current Outpatient Medications:  .  ACCU-CHEK FASTCLIX LANCETS MISC, USE ONE LANCET TWICE DAILY EACH TIME SUGARS ARE CHECKED., Disp: 102 each, Rfl: 12 .  ACCU-CHEK GUIDE test strip, USE AS INSTRUCTED TO TEST YOUR SUGARS TWICE DAILY., Disp: 100 each, Rfl: 12 .  acetaminophen (TYLENOL) 325 MG tablet, Take 2 tablets (650 mg total) by mouth every 6 (six) hours as needed., Disp: 90 tablet, Rfl: 3 .  Albuterol Sulfate (PROAIR RESPICLICK) 123XX123 (90 Base) MCG/ACT AEPB, Inhale 1-2 puffs into the lungs every 6 (six) hours as needed., Disp: 1 each, Rfl: 1 .  ALL DAY ALLERGY 10 MG tablet, TAKE 1 TABLET BY MOUTH ONCE DAILY, Disp: 30 tablet, Rfl: 11 .  atenolol (TENORMIN) 50 MG tablet, TAKE 2 TABLETS BY MOUTH AT BEDTIME., Disp: 180 tablet, Rfl: 1 .  desonide (DESOWEN) 0.05 % cream, APPLY TO THE AFFECTED AREA(S) TWICE DAILY, Disp: 60 g, Rfl: 3 .  escitalopram (LEXAPRO) 20 MG tablet, TAKE 1 TABLET (20 MG TOTAL) BY MOUTH DAILY. PLEASE SCHEDULE FOLLOW UP APPT WITH DR. TABORI FOR FURTHER REFILLS., Disp: 30 tablet, Rfl: 0 .  fluticasone (FLONASE) 50 MCG/ACT nasal spray, PLACE 2 SPRAYS INTO BOTH NOSTRILS DAILY., Disp: 16 g, Rfl: 6 .  furosemide (LASIX) 20 MG tablet, TAKE 1 TABLET BY MOUTH ONCE A DAY, Disp: 30 tablet,  Rfl: 3 .  hydrochlorothiazide (HYDRODIURIL) 25 MG tablet, TAKE 1 TABLET (25 MG TOTAL) BY MOUTH DAILY. PLEASE SCHEDULE FOLLOW UP APPT WITH DR. TABORI FOR FURTHER REFILLS., Disp: 30 tablet, Rfl: 0 .  losartan (COZAAR) 50 MG tablet, Take 2 tablets (100 mg total) by mouth daily. Please schedule follow up appt with Dr. Birdie Riddle for further refills., Disp: 60 tablet, Rfl: 0 .  metFORMIN  (GLUCOPHAGE-XR) 500 MG 24 hr tablet, TAKE 1 TABLET BY MOUTH TWICE DAILY WITH BREAKFAST, Disp: 180 tablet, Rfl: 1 .  montelukast (SINGULAIR) 10 MG tablet, Take 1 tablet (10 mg total) by mouth at bedtime., Disp: 30 tablet, Rfl: 3 .  mupirocin ointment (BACTROBAN) 2 %, Apply 1 application topically 2 (two) times daily., Disp: 30 g, Rfl: 3 .  pantoprazole (PROTONIX) 40 MG tablet, Take 1 tablet (40 mg total) by mouth daily., Disp: 30 tablet, Rfl: 3 .  potassium chloride SA (K-DUR,KLOR-CON) 20 MEQ tablet, TAKE 2 TABLETS BY MOUTH DAILY, Disp: 60 tablet, Rfl: 6 .  ranitidine (ZANTAC) 300 MG tablet, TAKE 1 TABLET BY MOUTH AT BEDTIME., Disp: 30 tablet, Rfl: 6 .  SSS 10-5 10-5 % FOAM, APPLY TO THE AFFECTED AREA ONCE DAILY, Disp: 60 g, Rfl: 3 .  valACYclovir (VALTREX) 1000 MG tablet, Take 1 tablet (1,000 mg total) by mouth 2 (two) times daily. (Patient taking differently: Take 1,000 mg by mouth as needed. ), Disp: 20 tablet, Rfl: 3  Allergies  Allergen Reactions  . Penicillins Other (See Comments)    Childhood allergy.    Objective:   LMP 12/04/2011   Patient is well-developed, well-nourished in no acute distress.  Resting comfortably at home.  Head is normocephalic, atraumatic.  No labored breathing.  Speech is clear and coherent with logical content.  Patient is alert and oriented at baseline.   Assessment and Plan:        1. Yeast vaginitis Concern for yeast vaginitis. Discussed proper feminine hygiene and recommended continued avoidance of scented or dyed soaps/lotions. Giving some improvement with Monistat will give Rx Diflucan 150 mg to take once. If not resolving will need in-office assessment.  Leeanne Rio, PA-C 07/12/2019

## 2019-07-13 ENCOUNTER — Encounter: Payer: Self-pay | Admitting: Physician Assistant

## 2019-07-24 ENCOUNTER — Other Ambulatory Visit: Payer: Self-pay | Admitting: Family Medicine

## 2019-07-24 MED FILL — FUROSEMIDE 20 MG TABS: 20 | 30 days supply | Qty: 30 | Fill #0

## 2019-07-25 MED FILL — LOSARTAN POTASSIUM 50 MG TA: 50 | 30 days supply | Qty: 60 | Fill #0

## 2019-08-03 ENCOUNTER — Emergency Department (HOSPITAL_BASED_OUTPATIENT_CLINIC_OR_DEPARTMENT_OTHER): Payer: 59

## 2019-08-03 ENCOUNTER — Emergency Department (HOSPITAL_BASED_OUTPATIENT_CLINIC_OR_DEPARTMENT_OTHER)
Admission: EM | Admit: 2019-08-03 | Discharge: 2019-08-04 | Disposition: A | Discharge status: Home / Self Care | Payer: 59 | Attending: Emergency Medicine | Admitting: Emergency Medicine

## 2019-08-03 ENCOUNTER — Other Ambulatory Visit: Payer: Self-pay

## 2019-08-03 ENCOUNTER — Encounter (HOSPITAL_BASED_OUTPATIENT_CLINIC_OR_DEPARTMENT_OTHER): Payer: Self-pay | Admitting: *Deleted

## 2019-08-03 DIAGNOSIS — D649 Anemia, unspecified: Secondary | ICD-10-CM

## 2019-08-03 DIAGNOSIS — Z20828 Contact with and (suspected) exposure to other viral communicable diseases: Secondary | ICD-10-CM | POA: Insufficient documentation

## 2019-08-03 DIAGNOSIS — I10 Essential (primary) hypertension: Secondary | ICD-10-CM | POA: Insufficient documentation

## 2019-08-03 DIAGNOSIS — Z87891 Personal history of nicotine dependence: Secondary | ICD-10-CM | POA: Diagnosis not present

## 2019-08-03 DIAGNOSIS — E11649 Type 2 diabetes mellitus with hypoglycemia without coma: Secondary | ICD-10-CM | POA: Diagnosis not present

## 2019-08-03 DIAGNOSIS — Z880 Allergy status to penicillin: Secondary | ICD-10-CM | POA: Diagnosis not present

## 2019-08-03 DIAGNOSIS — Z7984 Long term (current) use of oral hypoglycemic drugs: Secondary | ICD-10-CM | POA: Insufficient documentation

## 2019-08-03 DIAGNOSIS — Z79899 Other long term (current) drug therapy: Secondary | ICD-10-CM | POA: Diagnosis not present

## 2019-08-03 DIAGNOSIS — Z03818 Encounter for observation for suspected exposure to other biological agents ruled out: Secondary | ICD-10-CM | POA: Diagnosis not present

## 2019-08-03 DIAGNOSIS — R079 Chest pain, unspecified: Secondary | ICD-10-CM | POA: Diagnosis not present

## 2019-08-03 DIAGNOSIS — D500 Iron deficiency anemia secondary to blood loss (chronic): Secondary | ICD-10-CM | POA: Diagnosis not present

## 2019-08-03 LAB — TROPONIN I (HIGH SENSITIVITY): Troponin I (High Sensitivity): 4 ng/L (ref <18)

## 2019-08-03 LAB — CBC
HCT: 35.1 % — ABNORMAL LOW (ref 36.0–46.0)
Hemoglobin: 10.9 g/dL — ABNORMAL LOW (ref 12.0–15.0)
MCH: 27.9 pg (ref 26.0–34.0)
MCHC: 31.1 g/dL (ref 30.0–36.0)
MCV: 89.8 fL (ref 80.0–100.0)
Platelets: 340 10*3/uL (ref 150–400)
RBC: 3.91 MIL/uL (ref 3.87–5.11)
RDW: 16.2 % — ABNORMAL HIGH (ref 11.5–15.5)
WBC: 8.2 10*3/uL (ref 4.0–10.5)
nRBC: 0 % (ref 0.0–0.2)

## 2019-08-03 LAB — BASIC METABOLIC PANEL
Anion gap: 9 (ref 5–15)
BUN: 13 mg/dL (ref 6–20)
CO2: 25 mmol/L (ref 22–32)
Calcium: 8.6 mg/dL — ABNORMAL LOW (ref 8.9–10.3)
Chloride: 101 mmol/L (ref 98–111)
Creatinine, Ser: 1.06 mg/dL — ABNORMAL HIGH (ref 0.44–1.00)
GFR calc Af Amer: >60 mL/min (ref >60)
GFR calc non Af Amer: >60 mL/min (ref >60)
Glucose, Bld: 91 mg/dL (ref 70–99)
Potassium: 3.4 mmol/L — ABNORMAL LOW (ref 3.5–5.1)
Sodium: 135 mmol/L (ref 135–145)

## 2019-08-03 LAB — CBG MONITORING, ED: Glucose-Capillary: 88 mg/dL (ref 70–99)

## 2019-08-03 LAB — PREGNANCY, URINE: Preg Test, Ur: NEGATIVE

## 2019-08-03 MED ORDER — SODIUM CHLORIDE 0.9% FLUSH
3.0000 mL | Freq: Once | INTRAVENOUS | Status: DC
  Filled 2019-08-03: qty 3

## 2019-08-03 NOTE — ED Notes (Signed)
Prompted to provide urine sample 

## 2019-08-03 NOTE — ED Provider Notes (Signed)
[facility] Provider Note: Georgena Spurling, MD, FACEP  CSN: [id number] MRN: [id number] ARRIVAL: 08/03/19 at 2127 ROOM: [place]  Hypoglycemia   HISTORY OF PRESENT ILLNESS  08/03/19 10:54 PM Vanessa Reeves is a 48 y.o. female with type 2 diabetes on metformin.  Since July she has had several episodes of syncope.  These episodes were associated with hypoglycemia, blood sugar typically in the 60s when this occurs.  She had another episode earlier today while she was doing housework.  Her husband told her she did not look well.  She was feeling hot and sweaty as well as lightheaded.  Her blood sugar was checked and it was 69.  She ate some candy and her sugar came up to 110.  She had another episode about 8 PM today where she began feeling weak and nauseated and found her sugar to be 88.  She states her sugar is typically in the 130s.  It rarely gets above 200.  She denies any recent illness such as cold symptoms, nausea, vomiting or diarrhea apart from the nausea associated with hypoglycemia.  She denies dysuria.  She held her evening metformin but did eat dinner.   Past Medical History:  Diagnosis Date  . Anxiety   . Diabetes mellitus without complication ([place])   . Herpes   . Hypertension   . RLS (restless legs syndrome) 03/17/2017   Right leg    Past Surgical History:  Procedure Laterality Date  . ABDOMINAL HYSTERECTOMY    . DILATION AND CURETTAGE OF UTERUS    . TUBAL LIGATION      Family History  Problem Relation Age of Onset  . Hypertension Mother   . Stroke Father   . Hypertension Paternal Grandmother   . Diabetes Mellitus II Paternal Grandmother   . Healthy Brother   . Healthy Daughter   . Healthy Son     Social History   Tobacco Use  . Smoking status: Former Research scientist (life sciences)  . Smokeless tobacco: Never Used  Substance Use Topics  . Alcohol use: Yes    Alcohol/week: 0.0 standard drinks    Comment: socially  . Drug use: No    Prior to  Admission medications   Medication Sig Start Date End Date Taking? Authorizing Provider  ACCU-CHEK FASTCLIX LANCETS MISC USE ONE LANCET TWICE DAILY EACH TIME SUGARS ARE CHECKED. 08/15/18   Midge Minium, MD  ACCU-CHEK GUIDE test strip USE AS INSTRUCTED TO TEST YOUR SUGARS TWICE DAILY. 08/15/18   Midge Minium, MD  acetaminophen (TYLENOL) 325 MG tablet Take 2 tablets (650 mg total) by mouth every 6 (six) hours as needed. 06/14/17   Midge Minium, MD  Albuterol Sulfate (PROAIR RESPICLICK) 123XX123 (90 Base) MCG/ACT AEPB Inhale 1-2 puffs into the lungs every 6 (six) hours as needed. 11/14/18   Brunetta Jeans, PA-C  ALL DAY ALLERGY 10 MG tablet TAKE 1 TABLET BY MOUTH ONCE DAILY 11/17/16   Midge Minium, MD  atenolol (TENORMIN) 50 MG tablet TAKE 2 TABLETS BY MOUTH AT BEDTIME. 03/01/19   Midge Minium, MD  desonide (DESOWEN) 0.05 % cream APPLY TO THE AFFECTED AREA(S) TWICE DAILY 12/06/18   Midge Minium, MD  escitalopram (LEXAPRO) 20 MG tablet TAKE 1 TABLET (20 MG TOTAL) BY MOUTH DAILY. PLEASE SCHEDULE FOLLOW UP APPT WITH DR. TABORI FOR FURTHER REFILLS. 07/11/19   Midge Minium, MD  fluconazole (DIFLUCAN) 150 MG tablet Take 1 tablet (150 mg total) by mouth  daily. 07/12/19   Brunetta Jeans, PA-C  fluticasone (FLONASE) 50 MCG/ACT nasal spray PLACE 2 SPRAYS INTO BOTH NOSTRILS DAILY. 12/06/17   Midge Minium, MD  furosemide (LASIX) 20 MG tablet TAKE 1 TABLET BY MOUTH ONCE A DAY 07/11/19   Midge Minium, MD  hydrochlorothiazide (HYDRODIURIL) 25 MG tablet TAKE 1 TABLET (25 MG TOTAL) BY MOUTH DAILY. PLEASE SCHEDULE FOLLOW UP APPT WITH DR. TABORI FOR FURTHER REFILLS. 07/25/19   Midge Minium, MD  losartan (COZAAR) 50 MG tablet TAKE 2 TABLETS (100 MG TOTAL) BY MOUTH DAILY. PLEASE SCHEDULE FOLLOW UP APPT WITH DR. TABORI FOR FURTHER REFILLS. 07/25/19   Midge Minium, MD  metFORMIN (GLUCOPHAGE-XR) 500 MG 24 hr tablet TAKE 1 TABLET BY MOUTH TWICE DAILY WITH BREAKFAST  12/22/18   Midge Minium, MD  montelukast (SINGULAIR) 10 MG tablet Take 1 tablet (10 mg total) by mouth at bedtime. 03/04/18   Brunetta Jeans, PA-C  mupirocin ointment (BACTROBAN) 2 % Apply 1 application topically 2 (two) times daily. 08/10/18   Midge Minium, MD  ondansetron (ZOFRAN ODT) 8 MG disintegrating tablet Take 1 tablet (8 mg total) by mouth every 8 (eight) hours as needed for nausea or vomiting. 08/04/19   Elliet Goodnow, MD  pantoprazole (PROTONIX) 40 MG tablet Take 1 tablet (40 mg total) by mouth daily. 11/19/17   Brunetta Jeans, PA-C  potassium chloride SA (K-DUR,KLOR-CON) 20 MEQ tablet TAKE 2 TABLETS BY MOUTH DAILY 02/09/19   Midge Minium, MD  ranitidine (ZANTAC) 300 MG tablet TAKE 1 TABLET BY MOUTH AT BEDTIME. 12/30/16   Midge Minium, MD  SSS 10-5 10-5 % FOAM APPLY TO THE AFFECTED AREA ONCE DAILY 03/17/19   Midge Minium, MD  valACYclovir (VALTREX) 1000 MG tablet Take 1 tablet (1,000 mg total) by mouth 2 (two) times daily. Patient taking differently: Take 1,000 mg by mouth as needed.  07/09/16   Midge Minium, MD    Allergies Penicillins   REVIEW OF SYSTEMS  Negative except as noted here or in the History of Present Illness.   PHYSICAL EXAMINATION  Initial Vital Signs Blood pressure (!) 167/81, pulse 84, temperature 97.9 F (36.6 C), temperature source Oral, resp. rate 16, weight (!) 139.3 kg, last menstrual period 12/04/2011, SpO2 100 %.  Examination General: Well-developed, well-nourished female in no acute distress; appearance consistent with age of record HENT: normocephalic; atraumatic Eyes: pupils equal, round and reactive to light; extraocular muscles intact Neck: supple Heart: regular rate and rhythm Lungs: clear to auscultation bilaterally Abdomen: soft; nondistended; nontender; bowel sounds present Extremities: No deformity; full range of motion; pulses normal Neurologic: Awake, alert and oriented; motor function intact in all  extremities and symmetric; no facial droop Skin: Warm and dry Psychiatric: Normal mood and affect   RESULTS  Summary of this visit's results, reviewed by myself:   EKG Interpretation  Date/Time:  Thursday August 03 2019 21:48:06 EDT Ventricular Rate:  78 PR Interval:  166 QRS Duration: 80 QT Interval:  368 QTC Calculation: 419 R Axis:   -18 Text Interpretation:  Normal sinus rhythm Moderate voltage criteria for LVH, may be normal variant Borderline ECG No significant change was found Confirmed by Mckenzie Bove [phone]) on 08/03/2019 11:10:07 PM      Laboratory Studies: Results for orders placed or performed during the hospital encounter of 08/03/19 (from the past 24 hour(s))  POC CBG, ED     Status: None   Collection Time: 08/03/19 10:32 PM  Result Value Ref Range   Glucose-Capillary 88 70 - 99 mg/dL  Basic metabolic panel     Status: Abnormal   Collection Time: 08/03/19 10:34 PM  Result Value Ref Range   Sodium 135 135 - 145 mmol/L   Potassium 3.4 (L) 3.5 - 5.1 mmol/L   Chloride 101 98 - 111 mmol/L   CO2 25 22 - 32 mmol/L   Glucose, Bld 91 70 - 99 mg/dL   BUN 13 6 - 20 mg/dL   Creatinine, Ser 1.06 (H) 0.44 - 1.00 mg/dL   Calcium 8.6 (L) 8.9 - 10.3 mg/dL   GFR calc non Af Amer >60 >60 mL/min   GFR calc Af Amer >60 >60 mL/min   Anion gap 9 5 - 15  CBC     Status: Abnormal   Collection Time: 08/03/19 10:34 PM  Result Value Ref Range   WBC 8.2 4.0 - 10.5 K/uL   RBC 3.91 3.87 - 5.11 MIL/uL   Hemoglobin 10.9 (L) 12.0 - 15.0 g/dL   HCT 35.1 (L) 36.0 - 46.0 %   MCV 89.8 80.0 - 100.0 fL   MCH 27.9 26.0 - 34.0 pg   MCHC 31.1 30.0 - 36.0 g/dL   RDW 16.2 (H) 11.5 - 15.5 %   Platelets 340 150 - 400 K/uL   nRBC 0.0 0.0 - 0.2 %  Troponin I (High Sensitivity)     Status: None   Collection Time: 08/03/19 10:34 PM  Result Value Ref Range   Troponin I (High Sensitivity) 4 <18 ng/L  Pregnancy, urine     Status: None   Collection Time: 08/03/19 10:34 PM  Result Value Ref Range    Preg Test, Ur NEGATIVE NEGATIVE  Urinalysis, Routine w reflex microscopic     Status: Abnormal   Collection Time: 08/03/19 11:05 PM  Result Value Ref Range   Color, Urine YELLOW YELLOW   APPearance CLOUDY (A) CLEAR   Specific Gravity, Urine 1.020 1.005 - 1.030   pH 6.0 5.0 - 8.0   Glucose, UA NEGATIVE NEGATIVE mg/dL   Hgb urine dipstick TRACE (A) NEGATIVE   Bilirubin Urine NEGATIVE NEGATIVE   Ketones, ur NEGATIVE NEGATIVE mg/dL   Protein, ur NEGATIVE NEGATIVE mg/dL   Nitrite NEGATIVE NEGATIVE   Leukocytes,Ua NEGATIVE NEGATIVE  Urinalysis, Microscopic (reflex)     Status: Abnormal   Collection Time: 08/03/19 11:05 PM  Result Value Ref Range   RBC / HPF 0-5 0 - 5 RBC/hpf   WBC, UA NONE SEEN 0 - 5 WBC/hpf   Bacteria, UA FEW (A) NONE SEEN   Squamous Epithelial / LPF 6-10 0 - 5  Troponin I (High Sensitivity)     Status: None   Collection Time: 08/04/19 12:29 AM  Result Value Ref Range   Troponin I (High Sensitivity) 5 <18 ng/L   Imaging Studies: Dg Chest 2 View  Result Date: 08/03/2019 CLINICAL DATA:  48 year old female with chest pain. EXAM: CHEST - 2 VIEW COMPARISON:  Chest radiograph dated 03/04/2018 FINDINGS: The heart size and mediastinal contours are within normal limits. Both lungs are clear. The visualized skeletal structures are unremarkable. IMPRESSION: No active cardiopulmonary disease. Electronically Signed   By: Anner Crete M.D.   On: 08/03/2019 22:01    ED COURSE and MDM  Nursing notes and initial vitals signs, including pulse oximetry, reviewed.  Vitals:   08/03/19 2140 08/03/19 2141 08/03/19 2330 08/04/19 0030  BP: (!) 167/81     Pulse: 84  77 72  Resp: 16  Temp: 97.9 F (36.6 C)     TempSrc: Oral     SpO2: 100%  100% 100%  Weight:  (!) 139.3 kg     1:32 AM The cause of the patient's hypoglycemic episodes is unclear.  She is on metformin but this is not commonly associated with hypoglycemia.  There is no evidence of infection but we will test for  COVID is a possible subclinical etiology.  She was noted to be anemic but is already on iron supplementation for this.  He is complaining right now of nausea and would like a prescription for Zofran.  PROCEDURES    ED DIAGNOSES     ICD-10-CM   1. Hypoglycemic episode in patient with diabetes mellitus ([place])  E11.649   2. Normocytic anemia  D64.9        Javares Kaufhold, MD 08/04/19 [phone]

## 2019-08-03 NOTE — ED Notes (Signed)
Pt adds that she has been having CP

## 2019-08-03 NOTE — ED Notes (Signed)
CBG 88 

## 2019-08-03 NOTE — ED Triage Notes (Signed)
Pt is here for hypoglycemia.  Pt had 2 previous episodes of syncope with hypoglycemia in the summer.  These episodes were very scary for pt.  She reports feeling sweaty and weak and collapsing.  Pt CBG was in the 60's when this occurred.   Today pt was at home doing some housework when her husband advised her that she did not look well.  Her CBG was 69 when she checked it and she ate some candy and her CBG was 110 after this, she decided to rest, when she rechecked it it was 88 and she was feeling generally weak and nauseated and has a feeling of weakness.  Pt is tearful and states that she is scared.   Normal CBG for pt is in 130's.  Pt states that she has been feeling generally weak and shaky during the episode this pt.  No recent change in medication.  Pt has not taken her pm meds. She has eaten dinner.

## 2019-08-04 ENCOUNTER — Ambulatory Visit (INDEPENDENT_AMBULATORY_CARE_PROVIDER_SITE_OTHER): Payer: 59 | Admitting: Physician Assistant

## 2019-08-04 ENCOUNTER — Encounter: Payer: Self-pay | Admitting: Physician Assistant

## 2019-08-04 VITALS — BP 142/94 | HR 105

## 2019-08-04 DIAGNOSIS — R5382 Chronic fatigue, unspecified: Secondary | ICD-10-CM

## 2019-08-04 DIAGNOSIS — E11649 Type 2 diabetes mellitus with hypoglycemia without coma: Secondary | ICD-10-CM

## 2019-08-04 DIAGNOSIS — R55 Syncope and collapse: Secondary | ICD-10-CM | POA: Diagnosis not present

## 2019-08-04 LAB — URINALYSIS, ROUTINE W REFLEX MICROSCOPIC
Bilirubin Urine: NEGATIVE
Glucose, UA: NEGATIVE mg/dL
Ketones, ur: NEGATIVE mg/dL
Leukocytes,Ua: NEGATIVE
Nitrite: NEGATIVE
Protein, ur: NEGATIVE mg/dL
Specific Gravity, Urine: 1.02 (ref 1.005–1.030)
pH: 6 (ref 5.0–8.0)

## 2019-08-04 LAB — TROPONIN I (HIGH SENSITIVITY): Troponin I (High Sensitivity): 5 ng/L (ref <18)

## 2019-08-04 LAB — URINALYSIS, MICROSCOPIC (REFLEX): WBC, UA: NONE SEEN WBC/hpf (ref 0–5)

## 2019-08-04 LAB — SARS CORONAVIRUS 2 (TAT 6-24 HRS): SARS Coronavirus 2: NEGATIVE

## 2019-08-04 MED ORDER — ONDANSETRON HCL 4 MG/2ML IJ SOLN
4.0000 mg | Freq: Once | INTRAMUSCULAR | Status: AC
  Administered 2019-08-04: 02:00:00 4 mg via INTRAVENOUS
  Filled 2019-08-04: qty 2

## 2019-08-04 MED ORDER — ONDANSETRON 8 MG PO TBDP: 8.0000 mg | ORAL_TABLET | Freq: Three times a day (TID) | ORAL | 0 refills | Status: DC | PRN

## 2019-08-04 MED FILL — ONDANSETRON ODT 8 MG TABLET: 8 | 3 days supply | Qty: 10 | Fill #0

## 2019-08-04 NOTE — Patient Instructions (Addendum)
Instructions sent to MyChart.   Please make sure to keep well-hydrated. Do not skip meals -- try to eat something every 3.5-4 hours. Limit those carbohydrates! Start a small G2 Gatorade daily to make sure you are getting adequate electrolytes. We will see you Monday for your lab appointment.  For now keep close eye on glucose levels and record them. Cut back on Metformin to only 500 mg QD.   Take Tylenol ES or Excedrin for your headache today. Rest today so you can feel better!

## 2019-08-04 NOTE — ED Notes (Signed)
Notified lab to add on urinalysis.

## 2019-08-04 NOTE — Progress Notes (Signed)
I have discussed the procedure for the virtual visit with the patient who has given consent to proceed with assessment and treatment.   Vanessa Reeves S Juelz Claar, CMA     

## 2019-08-04 NOTE — Progress Notes (Signed)
Virtual Visit via Video   I connected with patient on 08/04/19 at  8:00 AM EDT by a video enabled telemedicine application and verified that I am speaking with the correct person using two identifiers.  Location patient: Home Location provider: [facility] Office Persons participating in the virtual visit: Patient, Provider, [place] (Patina Moore)  I discussed the limitations of evaluation and management by telemedicine and the availability of in person appointments. The patient expressed understanding and agreed to proceed.  Subjective:   HPI:    Patient presents via [facility] today for ER follow-up. Patient was seen in the ER last night after a near syncopal event. Patient endorses doing house work and noting feeling lightheaded, hot, sweaty and nauseated. Checked glucose level and was at 69. Husband gave her some candy and brought up to 88 then 110. Was taken to the ER due to feeling significantly fatigued and having a similar episode in July resulting in full syncope.   ER workup included EKG (NSR with rate of 78 bpm), CXR (negative), COVID testing (pending), glucose (88), elevated creatinine at 1.06 and hgb at 10.9 (patient with chronic anemia). Was given IV fluids and anti-nausea medication. Felts stable to discharge home with PCP follow-up.  Feeling fatigued this morning with mild headache. Glucose has been stable -- at 109 just before visit. Is hydrating and making sure to eat. Is holding her Metformin for now (she usually takes 1000 mg QD). Denies fever, chills, night sweats or weight loss. Sore throat she briefly mentioned in ER has resolved. States she has been tired since July. Denies significant anxiety. Denies depressed mood or anhedonia at present.    Lab Results  Component Value Date   HGBA1C 6.9 (H) 06/19/2019    ROS:   See pertinent positives and negatives per HPI.  Patient Active Problem List   Diagnosis Date Noted  . Diabetic polyneuropathy associated with  diabetes mellitus due to underlying condition ([place]) 06/14/2019  . Visit for preventive health examination 04/11/2018  . Breast cancer screening 04/11/2018  . Memory changes 11/23/2017  . RLS (restless legs syndrome) 03/17/2017  . 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 Reactive airway disease 08/21/2016  . Pain in right lower leg 04/06/2016  . Gastroesophageal reflux disease without esophagitis 12/20/2015  . Possible exposure to STD 06/07/2015  . Physical exam 12/14/2014  . Urinary frequency 03/30/2014  . Breast mass, left 02/16/2014  . Edema 02/16/2014  . Chest pain 12/29/2013  . Hypokalemia 08/28/2013  . Depression with anxiety 07/17/2013  . HTN (hypertension) 07/17/2013  . Eustachian tube dysfunction 07/17/2013  . Severe obesity (BMI >= 40) ([place]) 07/17/2013    Social History   Tobacco Use  . Smoking status: Former Research scientist (life sciences)  . Smokeless tobacco: Never Used  Substance Use Topics  . Alcohol use: Yes    Alcohol/week: 0.0 standard drinks    Comment: socially    Current Outpatient Medications:  .  ACCU-CHEK FASTCLIX LANCETS MISC, USE ONE LANCET TWICE DAILY EACH TIME SUGARS ARE CHECKED., Disp: 102 each, Rfl: 12 .  ACCU-CHEK GUIDE test strip, USE AS INSTRUCTED TO TEST YOUR SUGARS TWICE DAILY., Disp: 100 each, Rfl: 12 .  acetaminophen (TYLENOL) 325 MG tablet, Take 2 tablets (650 mg total) by mouth every 6 (six) hours as needed., Disp: 90 tablet, Rfl: 3 .  Albuterol Sulfate (PROAIR RESPICLICK) 076 (90 Base) MCG/ACT AEPB, Inhale 1-2 puffs into the lungs every 6 (six) hours as needed., Disp: 1 each, Rfl: 1 .  ALL DAY ALLERGY 10 MG tablet, TAKE 1 TABLET BY MOUTH ONCE DAILY, Disp: 30 tablet, Rfl: 11 .  atenolol (TENORMIN) 50 MG tablet, TAKE 2 TABLETS BY MOUTH AT BEDTIME., Disp: 180 tablet, Rfl: 1 .  desonide (DESOWEN) 0.05 % cream, APPLY TO THE AFFECTED AREA(S) TWICE DAILY, Disp: 60 g, Rfl: 3 .  escitalopram (LEXAPRO) 20 MG tablet, TAKE 1 TABLET (20  MG TOTAL) BY MOUTH DAILY. PLEASE SCHEDULE FOLLOW UP APPT WITH DR. TABORI FOR FURTHER REFILLS., Disp: 30 tablet, Rfl: 0 .  fluticasone (FLONASE) 50 MCG/ACT nasal spray, PLACE 2 SPRAYS INTO BOTH NOSTRILS DAILY., Disp: 16 g, Rfl: 6 .  furosemide (LASIX) 20 MG tablet, TAKE 1 TABLET BY MOUTH ONCE A DAY, Disp: 30 tablet, Rfl: 3 .  hydrochlorothiazide (HYDRODIURIL) 25 MG tablet, TAKE 1 TABLET (25 MG TOTAL) BY MOUTH DAILY. PLEASE SCHEDULE FOLLOW UP APPT WITH DR. TABORI FOR FURTHER REFILLS., Disp: 30 tablet, Rfl: 0 .  losartan (COZAAR) 50 MG tablet, TAKE 2 TABLETS (100 MG TOTAL) BY MOUTH DAILY. PLEASE SCHEDULE FOLLOW UP APPT WITH DR. TABORI FOR FURTHER REFILLS., Disp: 60 tablet, Rfl: 0 .  metFORMIN (GLUCOPHAGE-XR) 500 MG 24 hr tablet, TAKE 1 TABLET BY MOUTH TWICE DAILY WITH BREAKFAST, Disp: 180 tablet, Rfl: 1 .  montelukast (SINGULAIR) 10 MG tablet, Take 1 tablet (10 mg total) by mouth at bedtime., Disp: 30 tablet, Rfl: 3 .  mupirocin ointment (BACTROBAN) 2 %, Apply 1 application topically 2 (two) times daily., Disp: 30 g, Rfl: 3 .  ondansetron (ZOFRAN ODT) 8 MG disintegrating tablet, Take 1 tablet (8 mg total) by mouth every 8 (eight) hours as needed for nausea or vomiting., Disp: 10 tablet, Rfl: 0 .  pantoprazole (PROTONIX) 40 MG tablet, Take 1 tablet (40 mg total) by mouth daily., Disp: 30 tablet, Rfl: 3 .  potassium chloride SA (K-DUR,KLOR-CON) 20 MEQ tablet, TAKE 2 TABLETS BY MOUTH DAILY, Disp: 60 tablet, Rfl: 6 .  ranitidine (ZANTAC) 300 MG tablet, TAKE 1 TABLET BY MOUTH AT BEDTIME., Disp: 30 tablet, Rfl: 6 .  SSS 10-5 10-5 % FOAM, APPLY TO THE AFFECTED AREA ONCE DAILY, Disp: 60 g, Rfl: 3 .  valACYclovir (VALTREX) 1000 MG tablet, Take 1 tablet (1,000 mg total) by mouth 2 (two) times daily. (Patient taking differently: Take 1,000 mg by mouth as needed. ), Disp: 20 tablet, Rfl: 3  Allergies  Allergen Reactions  . Penicillins Other (See Comments)    Childhood allergy.    Objective:   BP (!) 142/94    Pulse (!) 105   LMP 12/04/2011   Patient is well-developed, well-nourished in no acute distress.  Resting comfortably at home.  Head is normocephalic, atraumatic.  No labored breathing.  Speech is clear and coherent with logical content.  Patient is alert and oriented at baseline.   Assessment and Plan:   1. Hypoglycemic episode in patient with diabetes mellitus ([place]) 2. Near syncope Very atypical with Metformin as this usually does not cause hypoglycemia. She has been very inconsistent with meals and hydration previously which I suspect is main culprit. She is working harder on this. Will have her cut back to 500 mg Metformin daily for now. COVID test pending (expect to be negative). Will wait for this result before having her come in to lab for blood work as noted below. Handout with instructions sent to MyChart. Strict ER precautions reviewed. - CBC w/Diff; Future - Comp Met (CMET); Future - Vitamin D (25 hydroxy); Future - B12 and Folate Panel; Future -  Iron; Future  3. Chronic fatigue Ongoing. Unsure if related to the aforementioned issue or a combination of things. TSH levels recently checked and within normal limits. Will have her come in for full lab panel to further assess.  - CBC w/Diff; Future - Comp Met (CMET); Future - Vitamin D (25 hydroxy); Future - B12 and Folate Panel; Future - Iron; Future    Leeanne Rio, PA-C 08/04/2019

## 2019-08-04 NOTE — Addendum Note (Signed)
Addended by: Raiford Noble C on: 08/04/2019 11:10 AM   Modules accepted: Orders

## 2019-08-07 ENCOUNTER — Ambulatory Visit (INDEPENDENT_AMBULATORY_CARE_PROVIDER_SITE_OTHER): Payer: 59

## 2019-08-07 ENCOUNTER — Other Ambulatory Visit: Payer: Self-pay

## 2019-08-07 DIAGNOSIS — R5382 Chronic fatigue, unspecified: Secondary | ICD-10-CM | POA: Diagnosis not present

## 2019-08-07 DIAGNOSIS — R55 Syncope and collapse: Secondary | ICD-10-CM | POA: Diagnosis not present

## 2019-08-07 DIAGNOSIS — E11649 Type 2 diabetes mellitus with hypoglycemia without coma: Secondary | ICD-10-CM | POA: Diagnosis not present

## 2019-08-07 LAB — CBC WITH DIFFERENTIAL/PLATELET
Basophils Absolute: 0 10*3/uL (ref 0.0–0.1)
Basophils Relative: 0.6 % (ref 0.0–3.0)
Eosinophils Absolute: 0.1 10*3/uL (ref 0.0–0.7)
Eosinophils Relative: 2 % (ref 0.0–5.0)
HCT: 35.6 % — ABNORMAL LOW (ref 36.0–46.0)
Hemoglobin: 11.7 g/dL — ABNORMAL LOW (ref 12.0–15.0)
Lymphocytes Relative: 41.7 % (ref 12.0–46.0)
Lymphs Abs: 2.7 10*3/uL (ref 0.7–4.0)
MCHC: 33 g/dL (ref 30.0–36.0)
MCV: 86.1 fl (ref 78.0–100.0)
Monocytes Absolute: 0.4 10*3/uL (ref 0.1–1.0)
Monocytes Relative: 6.4 % (ref 3.0–12.0)
Neutro Abs: 3.2 10*3/uL (ref 1.4–7.7)
Neutrophils Relative %: 49.3 % (ref 43.0–77.0)
Platelets: 427 10*3/uL — ABNORMAL HIGH (ref 150.0–400.0)
RBC: 4.14 Mil/uL (ref 3.87–5.11)
RDW: 16.7 % — ABNORMAL HIGH (ref 11.5–15.5)
WBC: 6.5 10*3/uL (ref 4.0–10.5)

## 2019-08-07 LAB — VITAMIN D 25 HYDROXY (VIT D DEFICIENCY, FRACTURES): VITD: 14.58 ng/mL — ABNORMAL LOW (ref 30.00–100.00)

## 2019-08-07 LAB — B12 AND FOLATE PANEL
Folate: 10.2 ng/mL (ref >5.9)
Vitamin B-12: 551 pg/mL (ref 211–911)

## 2019-08-07 LAB — COMPREHENSIVE METABOLIC PANEL
ALT: 10 U/L (ref 0–35)
AST: 9 U/L (ref 0–37)
Albumin: 4 g/dL (ref 3.5–5.2)
Alkaline Phosphatase: 75 U/L (ref 39–117)
BUN: 12 mg/dL (ref 6–23)
CO2: 32 mEq/L (ref 19–32)
Calcium: 9.7 mg/dL (ref 8.4–10.5)
Chloride: 94 mEq/L — ABNORMAL LOW (ref 96–112)
Creatinine, Ser: 1.04 mg/dL (ref 0.40–1.20)
GFR: 68.79 mL/min (ref >60.00)
Glucose, Bld: 127 mg/dL — ABNORMAL HIGH (ref 70–99)
Potassium: 3.8 mEq/L (ref 3.5–5.1)
Sodium: 134 mEq/L — ABNORMAL LOW (ref 135–145)
Total Bilirubin: 0.4 mg/dL (ref 0.2–1.2)
Total Protein: 7.4 g/dL (ref 6.0–8.3)

## 2019-08-07 LAB — SEDIMENTATION RATE: Sed Rate: 130 mm/hr — ABNORMAL HIGH (ref 0–20)

## 2019-08-07 LAB — IRON: Iron: 50 ug/dL (ref 42–145)

## 2019-08-07 MED FILL — TRIAZOLAM 0.25 MG TABLET: 0.25 | 1 days supply | Qty: 2 | Fill #0

## 2019-08-08 ENCOUNTER — Encounter: Payer: Self-pay | Admitting: Physician Assistant

## 2019-08-08 ENCOUNTER — Other Ambulatory Visit: Payer: Self-pay

## 2019-08-08 DIAGNOSIS — E878 Other disorders of electrolyte and fluid balance, not elsewhere classified: Secondary | ICD-10-CM

## 2019-08-08 DIAGNOSIS — E871 Hypo-osmolality and hyponatremia: Secondary | ICD-10-CM

## 2019-08-08 MED ORDER — VITAMIN D (ERGOCALCIFEROL) 1.25 MG (50000 UNIT) PO CAPS: 50000.0000 [IU] | ORAL_CAPSULE | ORAL | 4 refills | Status: DC

## 2019-08-08 MED FILL — VIT D2 1.25 MG (50,000 UNIT: 1.25 MG | 28 days supply | Qty: 4 | Fill #0

## 2019-08-09 ENCOUNTER — Encounter: Payer: Self-pay | Admitting: Physician Assistant

## 2019-08-09 LAB — ANTI-NUCLEAR AB-TITER (ANA TITER)
ANA TITER: 1:80 {titer} — ABNORMAL HIGH
ANA Titer 1: 1:80 {titer} — ABNORMAL HIGH

## 2019-08-09 LAB — ANA: Anti Nuclear Antibody (ANA): POSITIVE — AB

## 2019-08-10 ENCOUNTER — Encounter: Payer: Self-pay | Admitting: Physician Assistant

## 2019-08-10 ENCOUNTER — Other Ambulatory Visit: Payer: Self-pay | Admitting: Emergency Medicine

## 2019-08-10 ENCOUNTER — Ambulatory Visit: Payer: 59 | Admitting: Family Medicine

## 2019-08-10 DIAGNOSIS — R768 Other specified abnormal immunological findings in serum: Secondary | ICD-10-CM

## 2019-08-14 ENCOUNTER — Telehealth: Payer: Self-pay | Admitting: Family Medicine

## 2019-08-14 NOTE — Telephone Encounter (Signed)
FYI

## 2019-08-14 NOTE — Telephone Encounter (Signed)
I have placed FMLA forms in the bin upfront with a charge sheet. Pt needs these done for Anxiety attacks and she was out of work on 08/10/2019.

## 2019-08-15 MED FILL — METFORMIN HCL ER 500 MG TB2: 500 | 30 days supply | Qty: 60 | Fill #3

## 2019-08-18 ENCOUNTER — Encounter: Payer: Self-pay | Admitting: Physician Assistant

## 2019-08-21 ENCOUNTER — Encounter: Payer: Self-pay | Admitting: Physician Assistant

## 2019-08-21 ENCOUNTER — Encounter: Payer: Self-pay | Admitting: Family Medicine

## 2019-08-22 ENCOUNTER — Ambulatory Visit (INDEPENDENT_AMBULATORY_CARE_PROVIDER_SITE_OTHER): Payer: 59

## 2019-08-22 ENCOUNTER — Ambulatory Visit: Payer: 59

## 2019-08-22 ENCOUNTER — Other Ambulatory Visit: Payer: Self-pay

## 2019-08-22 DIAGNOSIS — E871 Hypo-osmolality and hyponatremia: Secondary | ICD-10-CM | POA: Diagnosis not present

## 2019-08-22 DIAGNOSIS — E878 Other disorders of electrolyte and fluid balance, not elsewhere classified: Secondary | ICD-10-CM

## 2019-08-22 LAB — BASIC METABOLIC PANEL
BUN: 12 mg/dL (ref 6–23)
CO2: 26 mEq/L (ref 19–32)
Calcium: 9 mg/dL (ref 8.4–10.5)
Chloride: 103 mEq/L (ref 96–112)
Creatinine, Ser: 0.94 mg/dL (ref 0.40–1.20)
GFR: 77.29 mL/min (ref >60.00)
Glucose, Bld: 80 mg/dL (ref 70–99)
Potassium: 3.6 mEq/L (ref 3.5–5.1)
Sodium: 136 mEq/L (ref 135–145)

## 2019-08-24 ENCOUNTER — Encounter: Payer: Self-pay | Admitting: Rheumatology

## 2019-08-25 NOTE — Progress Notes (Signed)
Office Visit Note  Patient: Vanessa Reeves             Date of Birth: 1971-07-11           MRN: [id number]             PCP: Midge Minium, MD Referring: Delorse Limber Visit Date: 09/08/2019 Occupation: CNA at [employer]  Subjective:  Positive ANA   History of Present Illness: Vanessa Reeves is a 48 y.o. female seen in consultation per request of her PCP.  According to patient her symptoms are started in July 2020 with a dizzy episode.  She stated improved after relaxing the day.  She had another episode in September 2020 where she did not eat all day and passed out.  At the time she was observed by her husband and her daughter and they noted that she had vomiting spells and passing out spells.  She did not go to the emergency room.  On August 04, 2019 she had a dizzy episode for which she was taken to [facility] at the time all her labs were normal and she was sent home.  She was advised to follow-up with the PCP.  She states she went to her PCP where extensive labs were obtained.  She also requested ANA test because she has history of lupus in her maternal grandmother.  She states her labs showed low vitamin D at 14, potassium at 3.4+ ANA and a sed rate of 130.  She was referred to me for positive ANA.  She was also placed on vitamin D supplement which she finished today.  Patient gives history of chronic SI joint pain since 2018.  She states that she gets frequent cortisone injections to her SI joints.  She states her fingers lock up at times when she gets up in the morning.  None of the other joints are painful.  Activities of Daily Living:  Patient reports morning stiffness for 1 hour.   Patient Reports nocturnal pain.  Difficulty dressing/grooming: Denies Difficulty climbing stairs: Denies Difficulty getting out of chair: Reports Difficulty using hands for taps, buttons, cutlery, and/or writing: Denies  Review of Systems  Constitutional: Positive for  fatigue. Negative for night sweats, weight gain and weight loss.  HENT: Positive for mouth dryness. Negative for mouth sores, trouble swallowing, trouble swallowing and nose dryness.   Eyes: Positive for dryness. Negative for pain, redness and visual disturbance.  Respiratory: Positive for shortness of breath. Negative for cough and difficulty breathing.   Cardiovascular: Negative for chest pain, palpitations, hypertension, irregular heartbeat and swelling in legs/feet.  Gastrointestinal: Positive for constipation. Negative for blood in stool and diarrhea.  Endocrine: Positive for cold intolerance and excessive thirst. Negative for increased urination.  Genitourinary: Negative for difficulty urinating and vaginal dryness.  Musculoskeletal: Positive for arthralgias, gait problem, joint pain, morning stiffness and muscle tenderness. Negative for joint swelling, myalgias, muscle weakness and myalgias.  Skin: Negative for color change, rash, hair loss, skin tightness, ulcers and sensitivity to sunlight.  Allergic/Immunologic: Negative for susceptible to infections.  Neurological: Positive for dizziness, light-headedness, numbness, headaches and weakness. Negative for memory loss and night sweats.  Hematological: Negative for bruising/bleeding tendency and swollen glands.  Psychiatric/Behavioral: Positive for depressed mood and sleep disturbance. The patient is nervous/anxious.     PMFS History:  Patient Active Problem List   Diagnosis Date Noted  . Diabetic polyneuropathy associated with diabetes mellitus due to underlying condition ([place]) 06/14/2019  .  Visit for preventive health examination 04/11/2018  . Breast cancer screening 04/11/2018  . Memory changes 11/23/2017  . RLS (restless legs syndrome) 03/17/2017  . 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 Reactive airway disease 08/21/2016  . Pain in right lower leg 04/06/2016  . Gastroesophageal reflux  disease without esophagitis 12/20/2015  . Possible exposure to STD 06/07/2015  . Physical exam 12/14/2014  . Urinary frequency 03/30/2014  . Breast mass, left 02/16/2014  . Edema 02/16/2014  . Chest pain 12/29/2013  . Hypokalemia 08/28/2013  . Depression with anxiety 07/17/2013  . HTN (hypertension) 07/17/2013  . Eustachian tube dysfunction 07/17/2013  . Severe obesity (BMI >= 40) ([place]) 07/17/2013    Past Medical History:  Diagnosis Date  . Anxiety   . Diabetes mellitus without complication ([place])   . Herpes   . Hypertension   . RLS (restless legs syndrome) 03/17/2017   Right leg    Family History  Problem Relation Age of Onset  . Hypertension Mother   . Stroke Father   . Hypertension Paternal Grandmother   . Diabetes Mellitus II Paternal Grandmother   . Healthy Brother   . Healthy Daughter   . Healthy Son    Past Surgical History:  Procedure Laterality Date  . ABDOMINAL HYSTERECTOMY    . DILATION AND CURETTAGE OF UTERUS    . TUBAL LIGATION     Social History   Social History Narrative   Lives with husband and 2 children in a 2 story home.     Works as a Chartered certified accountant at [employer].     Highest level of education:  High school, in college now   Immunization History  Administered Date(s) Administered  . Influenza,inj,Quad PF,6+ Mos 07/17/2013, 07/27/2014, 07/26/2015, 08/21/2016, 07/06/2017, 07/29/2018, 07/03/2019  . MMR 03/08/2017, 04/05/2017  . PPD Test 07/26/2015, 03/07/2018, 05/23/2018  . Pneumococcal Polysaccharide-23 01/16/2015  . Tdap 10/14/2015     Objective: Vital Signs: BP (!) 162/84 (BP Location: Left Arm, Patient Position: Sitting, Cuff Size: Large)   Pulse 61   Resp 18   Ht '5\' 5"'$  (1.651 m)   Wt (!) 308 lb 3.2 oz (139.8 kg)   LMP 12/04/2011   BMI 51.29 kg/m    Physical Exam Vitals signs and nursing note reviewed.  Constitutional:      Appearance: She is well-developed.  HENT:     Head: Normocephalic and atraumatic.  Eyes:      Conjunctiva/sclera: Conjunctivae normal.  Neck:     Musculoskeletal: Normal range of motion.  Cardiovascular:     Rate and Rhythm: Normal rate and regular rhythm.     Heart sounds: Normal heart sounds.  Pulmonary:     Effort: Pulmonary effort is normal.     Breath sounds: Normal breath sounds.  Abdominal:     General: Bowel sounds are normal.     Palpations: Abdomen is soft.  Lymphadenopathy:     Cervical: No cervical adenopathy.  Skin:    General: Skin is warm and dry.     Capillary Refill: Capillary refill takes less than 2 seconds.  Neurological:     Mental Status: She is alert and oriented to person, place, and time.  Psychiatric:        Behavior: Behavior normal.      Musculoskeletal Exam: C-spine and thoracic spine were in good range of motion.  She has some tenderness over SI joints.  Shoulder joints, elbow joints, wrist joints, MCPs PIPs DIPs with good range of  motion with no synovitis.  Hip joints, knee joints, ankles, MTPs PIPs DIPs with good range of motion with no synovitis.  CDAI Exam: CDAI Score: - Patient Global: -; Provider Global: - Swollen: -; Tender: - Joint Exam   No joint exam has been documented for this visit   There is currently no information documented on the homunculus. Go to the Rheumatology activity and complete the homunculus joint exam.  Investigation: Findings:  08/07/19: ANA 1:80 NH, 1:80 NS, sed rate 130, folate 10.2, Vitamin B12 551, iron 50, vitamin D 14.58  Component     Latest Ref Rng & Units 08/07/2019  Vitamin B12     211 - 911 pg/mL 551  Folate     >5.9 ng/mL 10.2  Anti Nuclear Antibody (ANA)     NEGATIVE POSITIVE (A)  Sed Rate     0 - 20 mm/hr 130 (H)  Iron     42 - 145 ug/dL 50  VITD     30.00 - 100.00 ng/mL 14.58 (L)   Component     Latest Ref Rng & Units 08/07/2019  ANA Titer 1     titer 1:80 (H)  ANA Pattern 1      Nuclear, Homogeneous (A)  ANA TITER     titer 1:80 (H)  ANA PATTERN      Nuclear, Speckled (A)    Imaging: Xr Pelvis 1-2 Views  Result Date: 09/08/2019 No SI joint to sclerosis or narrowing was noted.  She had some osteoarthritic changes.   Recent Labs: Lab Results  Component Value Date   WBC 6.5 08/07/2019   HGB 11.7 (L) 08/07/2019   PLT 427.0 (H) 08/07/2019   NA 136 08/22/2019   K 3.6 08/22/2019   CL 103 08/22/2019   CO2 26 08/22/2019   GLUCOSE 80 08/22/2019   BUN 12 08/22/2019   CREATININE 0.94 08/22/2019   BILITOT 0.4 08/07/2019   ALKPHOS 75 08/07/2019   AST 9 08/07/2019   ALT 10 08/07/2019   PROT 7.4 08/07/2019   ALBUMIN 4.0 08/07/2019   CALCIUM 9.0 08/22/2019   GFRAA >60 08/03/2019   QFTBGOLDPLUS NEGATIVE 07/03/2019    08/07/19: ANA 1:80 NH, 1:80 NS, sed rate 130, folate 10.2, Vitamin B12 551, iron 50, vitamin D 14.58 Speciality Comments: No specialty comments available.  Procedures:  No procedures performed Allergies: Penicillins   Assessment / Plan:     Visit Diagnoses: Positive ANA (antinuclear antibody) -patient has low titer ANA and no clinical features of autoimmune disease.  There is also positive history of lupus in her maternal grandmother.  It is not unusual to find positive ANA and family members of lupus.  I will obtain [facility] labs for thyroid work-up.   Elevated sed rate -her sed rate was very high.  I do not know the etiology of elevated sedimentation rate.  Plan: ESR, Serum protein electrophoresis with reflex  DDD (degenerative disc disease), lumbar - MRI February 2018 showed L4-5 mild disc bulge and facet joint arthropathy.  Chronic SI joint pain -patient complains of chronic SI joint pain for several years.  She has difficulty getting up from the chair at times because of SI joint pain.  She gets cortisone injections frequently.  She would benefit from weight loss.  Plan: XR Pelvis 1-2 Views, x-ray showed only osteoarthritic changes.  HLA-B27  Other fatigue -she complains of increased fatigue for the last few months.  Plan: CK, TSH  Diabetic  polyneuropathy associated with diabetes mellitus due to underlying condition (  [place])  Gastroesophageal reflux disease without esophagitis  Moderate persistent reactive airway disease with acute exacerbation  Essential hypertension-her blood pressure is a still elevated.  RLS (restless legs syndrome)  Memory changes  Depression with anxiety  ADHD (attention deficit hyperactivity disorder), inattentive type  Former smoker  Orders: Orders Placed This Encounter  Procedures  . XR Pelvis 1-2 Views  . ESR  . HLA-B27  . CK  . TSH  . Serum protein electrophoresis with reflex   No orders of the defined types were placed in this encounter.   Face-to-face time spent with patient was 50 minutes. Greater than 50% of time was spent in counseling and coordination of care.  Follow-Up Instructions: Return for Positive ANA.   Bo Merino, MD  Note - This record has been created using Editor, commissioning.  Chart creation errors have been sought, but may not always  have been located. Such creation errors do not reflect on  the standard of medical care.

## 2019-08-28 DIAGNOSIS — Z0279 Encounter for issue of other medical certificate: Secondary | ICD-10-CM

## 2019-08-28 NOTE — Telephone Encounter (Signed)
Form completed and placed in basket  

## 2019-08-28 NOTE — Telephone Encounter (Signed)
Picked up from the back and faxed to the # listed on the forms

## 2019-09-01 ENCOUNTER — Encounter: Payer: Self-pay | Admitting: Physician Assistant

## 2019-09-06 ENCOUNTER — Other Ambulatory Visit: Payer: Self-pay | Admitting: Family Medicine

## 2019-09-06 MED FILL — SSS 10-5 FOAM: 10-5 | 30 days supply | Qty: 60 | Fill #0

## 2019-09-06 MED FILL — LOSARTAN POTASSIUM 50 MG TA: 50 | 30 days supply | Qty: 60 | Fill #0

## 2019-09-06 NOTE — Telephone Encounter (Signed)
Please advise if ok to refill SS foam

## 2019-09-07 MED FILL — POTASSIUM CHLORIDE CRYS ER: 20 | 30 days supply | Qty: 60 | Fill #3

## 2019-09-07 MED FILL — FUROSEMIDE 20 MG TABS: 20 | 30 days supply | Qty: 30 | Fill #1

## 2019-09-08 ENCOUNTER — Other Ambulatory Visit: Payer: Self-pay

## 2019-09-08 ENCOUNTER — Ambulatory Visit (INDEPENDENT_AMBULATORY_CARE_PROVIDER_SITE_OTHER): Payer: 59

## 2019-09-08 ENCOUNTER — Ambulatory Visit: Payer: 59 | Admitting: Rheumatology

## 2019-09-08 ENCOUNTER — Encounter: Payer: Self-pay | Admitting: Rheumatology

## 2019-09-08 VITALS — BP 162/84 | HR 61 | Resp 18 | Ht 65.0 in | Wt 308.2 lb

## 2019-09-08 DIAGNOSIS — G2581 Restless legs syndrome: Secondary | ICD-10-CM

## 2019-09-08 DIAGNOSIS — G8929 Other chronic pain: Secondary | ICD-10-CM

## 2019-09-08 DIAGNOSIS — M533 Sacrococcygeal disorders, not elsewhere classified: Secondary | ICD-10-CM | POA: Diagnosis not present

## 2019-09-08 DIAGNOSIS — R5383 Other fatigue: Secondary | ICD-10-CM

## 2019-09-08 DIAGNOSIS — R700 Elevated erythrocyte sedimentation rate: Secondary | ICD-10-CM

## 2019-09-08 DIAGNOSIS — J4541 Moderate persistent asthma with (acute) exacerbation: Secondary | ICD-10-CM

## 2019-09-08 DIAGNOSIS — M5136 Other intervertebral disc degeneration, lumbar region: Secondary | ICD-10-CM | POA: Diagnosis not present

## 2019-09-08 DIAGNOSIS — K219 Gastro-esophageal reflux disease without esophagitis: Secondary | ICD-10-CM

## 2019-09-08 DIAGNOSIS — R768 Other specified abnormal immunological findings in serum: Secondary | ICD-10-CM | POA: Diagnosis not present

## 2019-09-08 DIAGNOSIS — Z87891 Personal history of nicotine dependence: Secondary | ICD-10-CM

## 2019-09-08 DIAGNOSIS — F900 Attention-deficit hyperactivity disorder, predominantly inattentive type: Secondary | ICD-10-CM

## 2019-09-08 DIAGNOSIS — E0842 Diabetes mellitus due to underlying condition with diabetic polyneuropathy: Secondary | ICD-10-CM

## 2019-09-08 DIAGNOSIS — R413 Other amnesia: Secondary | ICD-10-CM

## 2019-09-08 DIAGNOSIS — I10 Essential (primary) hypertension: Secondary | ICD-10-CM | POA: Diagnosis not present

## 2019-09-08 DIAGNOSIS — F418 Other specified anxiety disorders: Secondary | ICD-10-CM

## 2019-09-11 ENCOUNTER — Encounter: Payer: Self-pay | Admitting: Rheumatology

## 2019-09-11 DIAGNOSIS — R768 Other specified abnormal immunological findings in serum: Secondary | ICD-10-CM | POA: Diagnosis not present

## 2019-09-11 MED FILL — TRIAZOLAM 0.25 MG TABLET: 0.25 | 1 days supply | Qty: 2 | Fill #0

## 2019-09-12 LAB — SEDIMENTATION RATE: Sed Rate: 58 mm/h — ABNORMAL HIGH (ref 0–20)

## 2019-09-12 LAB — TSH: TSH: 3.44 mIU/L

## 2019-09-12 LAB — PROTEIN ELECTROPHORESIS, SERUM, WITH REFLEX
Albumin ELP: 3.5 g/dL — ABNORMAL LOW (ref 3.8–4.8)
Alpha 1: 0.4 g/dL — ABNORMAL HIGH (ref 0.2–0.3)
Alpha 2: 0.8 g/dL (ref 0.5–0.9)
Beta 2: 0.5 g/dL (ref 0.2–0.5)
Beta Globulin: 0.5 g/dL (ref 0.4–0.6)
Gamma Globulin: 1.5 g/dL (ref 0.8–1.7)
Total Protein: 7.2 g/dL (ref 6.1–8.1)

## 2019-09-12 LAB — HLA-B27 ANTIGEN: HLA-B27 Antigen: NEGATIVE

## 2019-09-12 LAB — CK: Total CK: 106 U/L (ref 29–143)

## 2019-09-12 NOTE — Progress Notes (Signed)
I will discuss results at the follow-up visit.

## 2019-09-13 ENCOUNTER — Encounter: Payer: Self-pay | Admitting: Rheumatology

## 2019-09-19 NOTE — Progress Notes (Signed)
Office Visit Note  Patient: Vanessa Reeves             Date of Birth: 1971-01-24           MRN: [id number]             PCP: Midge Minium, MD Referring: Midge Minium, MD Visit Date: 09/22/2019 Occupation: _0 @  Subjective:  Right SI joint pain   History of Present Illness: Vanessa Reeves is a 48 y.o. female with history of autoimmune disease and DDD.  Patient presents today to review lab work.  She continues to have percent assistant right SI joint pain.  She has had cortisone injections in the past as well as gone to physical therapy but continues have severe discomfort.  She experiences pain and stiffness in bilateral hands worse in the morning.  She denies any joint swelling at this time.  She continues have intermittent pain in the right knee joint.  She denies any recent rashes but states that she does experience photosensitivity.  She is also noticed some hair loss.  She is been having recurrent oral ulcerations but denies any nasal ulcerations.  She has chronic sicca symptoms.  She has also been experiencing symptoms of Raynaud's intermittently.     Activities of Daily Living:  Patient reports morning stiffness for 30 minutes.   Patient Reports nocturnal pain.  Difficulty dressing/grooming: Denies Difficulty climbing stairs: Denies Difficulty getting out of chair: Denies Difficulty using hands for taps, buttons, cutlery, and/or writing: Denies  Review of Systems  Constitutional: Positive for fatigue.  HENT: Positive for mouth dryness. Negative for mouth sores and nose dryness.   Eyes: Positive for dryness. Negative for pain and visual disturbance.  Respiratory: Negative for cough, hemoptysis, shortness of breath and difficulty breathing.   Cardiovascular: Negative for chest pain, palpitations and hypertension.  Gastrointestinal: Positive for constipation. Negative for blood in stool and diarrhea.  Endocrine: Positive for excessive thirst.    Genitourinary: Negative for difficulty urinating and painful urination.  Musculoskeletal: Positive for arthralgias, joint pain, morning stiffness and muscle tenderness. Negative for joint swelling, myalgias, muscle weakness and myalgias.  Skin: Negative for color change, pallor, rash, hair loss, nodules/bumps, skin tightness, ulcers and sensitivity to sunlight.  Allergic/Immunologic: Negative for susceptible to infections.  Neurological: Negative for dizziness, numbness and weakness.  Hematological: Negative for bruising/bleeding tendency and swollen glands.  Psychiatric/Behavioral: Positive for sleep disturbance. Negative for depressed mood. The patient is not nervous/anxious.     PMFS History:  Patient Active Problem List   Diagnosis Date Noted   Diabetic polyneuropathy associated with diabetes mellitus due to underlying condition ([place]) 06/14/2019   Visit for preventive health examination 04/11/2018   Breast cancer screening 04/11/2018   Memory changes 11/23/2017   RLS (restless legs syndrome) 03/17/2017   ADHD (attention deficit hyperactivity disorder), 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   Reactive airway disease 08/21/2016   Pain in right lower leg 04/06/2016   Gastroesophageal reflux disease without esophagitis 12/20/2015   Possible exposure to STD 06/07/2015   Physical exam 12/14/2014   Urinary frequency 03/30/2014   Breast mass, left 02/16/2014   Edema 02/16/2014   Chest pain 12/29/2013   Hypokalemia 08/28/2013   Depression with anxiety 07/17/2013   HTN (hypertension) 07/17/2013   Eustachian tube dysfunction 07/17/2013   Severe obesity (BMI >= 40) ([place]) 07/17/2013    Past Medical History:  Diagnosis Date   Anxiety    Diabetes mellitus without complication ([place])    Herpes  Hypertension    RLS (restless legs syndrome) 03/17/2017   Right leg    Family History  Problem Relation Age of Onset   Hypertension Mother    Stroke Father    Hypertension  Paternal Grandmother    Diabetes Mellitus II Paternal Grandmother    Healthy Brother    Healthy Daughter    Healthy Son    Past Surgical History:  Procedure Laterality Date   ABDOMINAL HYSTERECTOMY     DILATION AND CURETTAGE OF UTERUS     TUBAL LIGATION     Social History   Social History Narrative   Lives with husband and 2 children in a 2 story home.     Works as a Chartered certified accountant at [employer].     Highest level of education:  High school, in college now   Immunization History  Administered Date(s) Administered   Influenza,inj,Quad PF,6+ Mos 07/17/2013, 07/27/2014, 07/26/2015, 08/21/2016, 07/06/2017, 07/29/2018, 07/03/2019   MMR 03/08/2017, 04/05/2017   PPD Test 07/26/2015, 03/07/2018, 05/23/2018   Pneumococcal Polysaccharide-23 01/16/2015   Tdap 10/14/2015     Objective: Vital Signs: BP (!) 146/81 (BP Location: Left Arm, Patient Position: Sitting, Cuff Size: Normal)    Pulse 63    Resp 18    Ht 5' 5" (1.651 m)    Wt (!) 309 lb 3.2 oz (140.3 kg)    LMP 12/04/2011    BMI 51.45 kg/m    Physical Exam Vitals signs and nursing note reviewed.  Constitutional:      Appearance: She is well-developed.  HENT:     Head: Normocephalic and atraumatic.  Eyes:     Conjunctiva/sclera: Conjunctivae normal.  Neck:     Musculoskeletal: Normal range of motion.  Cardiovascular:     Rate and Rhythm: Normal rate and regular rhythm.     Heart sounds: Normal heart sounds.  Pulmonary:     Effort: Pulmonary effort is normal.     Breath sounds: Normal breath sounds.  Abdominal:     General: Bowel sounds are normal.     Palpations: Abdomen is soft.  Lymphadenopathy:     Cervical: No cervical adenopathy.  Skin:    General: Skin is warm and dry.     Capillary Refill: Capillary refill takes less than 2 seconds.  Neurological:     Mental Status: She is alert and oriented to person, place, and time.  Psychiatric:        Behavior: Behavior normal.      Musculoskeletal Exam:  C-spine good range of motion.  Thoracic and lumbar spine good range of motion.  She has tenderness over the right SI joint.  Shoulder joints, elbow joints, wrist joints, MCPs, PIPs and DIPs good range of motion no synovitis.  She has complete fist formation bilaterally.  She has tenderness of the right third and fourth MCP and PIP joints.  Hip joints have good range of motion right knee has good range of motion with some discomfort.  No warmth or effusion was noted.  Left knee joint has good range of motion with no warmth or effusion.  Ankle joints have good range of motion with no tenderness or inflammation.  CDAI Exam: CDAI Score: -- Patient Global: --; Provider Global: -- Swollen: --; Tender: -- Joint Exam   No joint exam has been documented for this visit   There is currently no information documented on the homunculus. Go to the Rheumatology activity and complete the homunculus joint exam.  Investigation: No additional findings.  Imaging:  Xr Pelvis 1-2 Views  Result Date: 09/08/2019 No SI joint to sclerosis or narrowing was noted.  She had some osteoarthritic changes.   Recent Labs: Lab Results  Component Value Date   WBC 6.5 08/07/2019   HGB 11.7 (L) 08/07/2019   PLT 427.0 (H) 08/07/2019   NA 136 08/22/2019   K 3.6 08/22/2019   CL 103 08/22/2019   CO2 26 08/22/2019   GLUCOSE 80 08/22/2019   BUN 12 08/22/2019   CREATININE 0.94 08/22/2019   BILITOT 0.4 08/07/2019   ALKPHOS 75 08/07/2019   AST 9 08/07/2019   ALT 10 08/07/2019   PROT 7.2 09/08/2019   ALBUMIN 4.0 08/07/2019   CALCIUM 9.0 08/22/2019   GFRAA >60 08/03/2019   QFTBGOLDPLUS NEGATIVE 07/03/2019       08/07/19: ANA 1:80 NH, 1:80 NS, sed rate 130, folate 10.2, Vitamin B12 551, iron 50, vitamin D 14.58 September 11, 2019 AVISE index -1.3, ANA 1: 160 nuclear homogeneous, anti-Ro antibody positive, (double-stranded DNA, Smith, RNP, anti-La, SCL 70, centromere, anti-RNA polymerase 3, anti-Jo 1 -), CPK abnormal,  anticardiolipin negative, beta-2 negative, RF negative, anti-CCP negative, anticar P negative, anti-TPO negative, antithyroglobulin negative  Speciality Comments: No specialty comments available.  Procedures:  No procedures performed Allergies: Penicillins   Assessment / Plan:     Visit Diagnoses: Autoimmune disease ([place]) - ANA 1: 160 nuclear homogenous, anti-Ro positive, ESR 58, Oral ulcers, Raynaud's, photosensitivity, arthralgias, family history of lupus-maternal grandmother: Patient presents today to discuss lab work.  Lab work findings revealed positive ANA and positive Ro antibody.  She also has elevated sed rate and SPEP was consistent with an acute inflammatory pattern.  She has no synovitis on exam today.  She has been experiencing increased pain in bilateral hands as well as morning stiffness.  She has tenderness of the right third and fourth MCP and PIP joints.  She continues to have persistent right SI joint tenderness.  She continues to have chronic sicca symptoms.  We discussed the use of over-the-counter products.  She is also been having intermittent symptoms of Raynaud's but no digital ulcerations or signs of gangrene were noted.  Good capillary refill was noted on exam.  She has not had any recent rashes.  No Maller rash was noted on exam.  She continues to have photosensitivity.  We discussed the use of sunscreen on a daily basis.  According to the patient she had an oral ulceration 1 week ago but has not had any nasal ulcerations.  She has not had any shortness of breath, palpitations, or chest pain.  She continues to have chronic fatigue related to insomnia.  She has not had any enlarged lymph nodes or fevers recently.  We will start her on a trial of Plaquenil.  She will take Plaquenil 200 mg 1 tablet by mouth twice daily.  Indications, contraindications, potential side effects of Plaquenil were discussed today.  All questions were addressed and consent was obtained.  She is aware that  she will require a baseline Plaquenil eye exam within 1 month of starting on Plaquenil.  She will follow-up in the office in 1 month for lab work and to assess her response.  She was advised to notify us if she cannot tolerate taking Plaquenil.   High risk medication use -She will be starting on Plaquenil 200 mg 1 tablet twice daily.  She will return for lab work in 1 month in 3 months and every 5 months.  Standing orders are in place.  Plan: CBC with Differential/Platelet, COMPLETE METABOLIC PANEL WITH GFR ° °Elevated sed rate - Repeat sed rate 58, SPEP-acute inflammatory pattern. She has no obvious joint synovitis on exam. ° °Other fatigue: She has chronic fatigue related to insomnia.  She has very interrupted sleep at night due to discomfort in her right SI joint.  She has only been sleeping 3 to 4 hours per night.  She is been taking NyQuil on a nightly basis.  We discussed avoiding the use of NyQuil.  Good sleep hygiene was discussed. ° °DDD (degenerative disc disease), lumbar: She has no midline spinal tenderness at this time.  No symptoms of radiculopathy at this time.  ° °Vitamin D deficiency - August 07, 2019 vitamin D 14.58.  She is taking vitamin D 50,000 units 1 capsule by mouth once weekly. ° °Chronic SI joint pain: She has chronic right SI joint pain.  She has tenderness on exam today.  She experiences severe pain at night.  She has had cortisone injections in the past and has been to physical therapy.  She performs stretching exercises at home. ° °Other medical conditions are listed as follows: ° °Diabetic polyneuropathy associated with diabetes mellitus due to underlying condition ([facility]) ° °Essential hypertension ° °Gastroesophageal reflux disease without esophagitis ° °Moderate persistent reactive airway disease with acute exacerbation ° °RLS (restless legs syndrome) ° °Depression with anxiety ° °ADHD (attention deficit hyperactivity disorder), inattentive type ° °Memory changes ° °Former  smoker ° °Orders: °Orders Placed This Encounter  °Procedures  °• CBC with Differential/Platelet  °• COMPLETE METABOLIC PANEL WITH GFR  ° °No orders of the defined types were placed in this encounter. ° ° °Face-to-face time spent with patient was 30 minutes. Greater than 50% of time was spent in counseling and coordination of care. ° °Follow-Up Instructions: Return in 4 weeks (on 10/20/2019) for Positive ANA, DDD. ° ° °Taylor M Dale, PA-C  ° °I examined and evaluated the patient with Taylor Dale PA.  We had detailed discussion with the patient regarding her lab work.  She qualifies for autoimmune disease.  After indications side effects contraindications were discussed she was started on Plaquenil.  Side effects were discussed at length.  The plan of care was discussed as noted above. ° ° , MD ° °Note - This record has been created using Dragon software.  °Chart creation errors have been sought, but may not always  °have been located. Such creation errors do not reflect on  °the standard of medical care. °

## 2019-09-20 ENCOUNTER — Ambulatory Visit: Payer: 59 | Admitting: Family Medicine

## 2019-09-22 ENCOUNTER — Ambulatory Visit: Payer: 59 | Admitting: Rheumatology

## 2019-09-22 ENCOUNTER — Other Ambulatory Visit: Payer: Self-pay | Admitting: Family Medicine

## 2019-09-22 ENCOUNTER — Other Ambulatory Visit: Payer: Self-pay

## 2019-09-22 ENCOUNTER — Encounter: Payer: Self-pay | Admitting: Rheumatology

## 2019-09-22 VITALS — BP 146/81 | HR 63 | Resp 18 | Ht 65.0 in | Wt 309.2 lb

## 2019-09-22 DIAGNOSIS — Z79899 Other long term (current) drug therapy: Secondary | ICD-10-CM

## 2019-09-22 DIAGNOSIS — K219 Gastro-esophageal reflux disease without esophagitis: Secondary | ICD-10-CM

## 2019-09-22 DIAGNOSIS — I10 Essential (primary) hypertension: Secondary | ICD-10-CM

## 2019-09-22 DIAGNOSIS — E559 Vitamin D deficiency, unspecified: Secondary | ICD-10-CM

## 2019-09-22 DIAGNOSIS — G2581 Restless legs syndrome: Secondary | ICD-10-CM

## 2019-09-22 DIAGNOSIS — M359 Systemic involvement of connective tissue, unspecified: Secondary | ICD-10-CM | POA: Diagnosis not present

## 2019-09-22 DIAGNOSIS — J4541 Moderate persistent asthma with (acute) exacerbation: Secondary | ICD-10-CM

## 2019-09-22 DIAGNOSIS — M533 Sacrococcygeal disorders, not elsewhere classified: Secondary | ICD-10-CM | POA: Diagnosis not present

## 2019-09-22 DIAGNOSIS — Z87891 Personal history of nicotine dependence: Secondary | ICD-10-CM

## 2019-09-22 DIAGNOSIS — E0842 Diabetes mellitus due to underlying condition with diabetic polyneuropathy: Secondary | ICD-10-CM | POA: Diagnosis not present

## 2019-09-22 DIAGNOSIS — F418 Other specified anxiety disorders: Secondary | ICD-10-CM

## 2019-09-22 DIAGNOSIS — R5383 Other fatigue: Secondary | ICD-10-CM

## 2019-09-22 DIAGNOSIS — M5136 Other intervertebral disc degeneration, lumbar region: Secondary | ICD-10-CM | POA: Diagnosis not present

## 2019-09-22 DIAGNOSIS — G8929 Other chronic pain: Secondary | ICD-10-CM

## 2019-09-22 DIAGNOSIS — R700 Elevated erythrocyte sedimentation rate: Secondary | ICD-10-CM

## 2019-09-22 DIAGNOSIS — R413 Other amnesia: Secondary | ICD-10-CM

## 2019-09-22 DIAGNOSIS — F900 Attention-deficit hyperactivity disorder, predominantly inattentive type: Secondary | ICD-10-CM

## 2019-09-22 MED ORDER — HYDROXYCHLOROQUINE SULFATE 200 MG PO TABS: 200.0000 mg | ORAL_TABLET | Freq: Two times a day (BID) | ORAL | 0 refills | Status: DC

## 2019-09-22 MED FILL — DESONIDE 0.05 % CREA: 0.05 | 30 days supply | Qty: 60 | Fill #2

## 2019-09-22 MED FILL — metFORMIN HCL ER 500 MG TB2: 500 | 30 days supply | Qty: 60 | Fill #0

## 2019-09-22 MED FILL — ESCITALOPRAM 20 MG TABLET: 20 | 30 days supply | Qty: 30 | Fill #0

## 2019-09-22 MED FILL — ATENOLOL 50 MG TABLET: 50 | 90 days supply | Qty: 180 | Fill #0

## 2019-09-22 MED FILL — ACCU-CHEK GUIDE TEST STRIP: 50 days supply | Qty: 100 | Fill #0

## 2019-09-22 MED FILL — ACCU-CHEK FASTCLIX LANCETS: 51 days supply | Qty: 102 | Fill #0

## 2019-09-22 MED FILL — HYDROCHLOROTHIAZIDE 25 MG T: 25 | 30 days supply | Qty: 30 | Fill #0

## 2019-09-22 MED FILL — HYDROXYCHLOROQUINE 200 MG T: 200 | 30 days supply | Qty: 60 | Fill #0

## 2019-09-22 NOTE — Patient Instructions (Signed)
Standing Labs We placed an order today for your standing lab work.    Please come back and get your standing labs in 1 month, 3 months, and every 5 months   We have open lab daily Monday through Thursday from 8:30-12:30 PM and 1:30-4:30 PM and Friday from 8:30-12:30 PM and 1:30-4:00 PM at the office of Dr. Bo Merino.   You may experience shorter wait times on Monday and Friday afternoons. The office is located at [address] [place], [place], [place] [address] No appointment is necessary.   Labs are drawn by [facility].  You may receive a bill from [facility] for your lab work.  If you wish to have your labs drawn at another location, please call the office 24 hours in advance to send orders.  If you have any questions regarding directions or hours of operation,  please call [phone].   Just as a reminder please drink plenty of water prior to coming for your lab work. Thanks!     Hydroxychloroquine tablets What is this medicine? HYDROXYCHLOROQUINE (hye drox ee KLOR oh kwin) is used to treat rheumatoid arthritis and systemic lupus erythematosus. It is also used to treat malaria. This medicine may be used for other purposes; ask your health care provider or pharmacist if you have questions. COMMON BRAND NAME(S): Plaquenil, Quineprox What should I tell my health care provider before I take this medicine? They need to know if you have any of these conditions:  diabetes  eye disease, vision problems  G6PD deficiency  heart disease  history of irregular heartbeat  if you often drink alcohol  kidney disease  liver disease  porphyria  psoriasis  an unusual or allergic reaction to chloroquine, hydroxychloroquine, other medicines, foods, dyes, or preservatives  pregnant or trying to get pregnant  breast-feeding How should I use this medicine? Take this medicine by mouth with a glass of water. Follow the directions on the prescription label. Do not cut, crush or  chew this medicine. Swallow the tablets whole. Take this medicine with food. Avoid taking antacids within 4 hours of taking this medicine. It is best to separate these medicines by at least 4 hours. Take your medicine at regular intervals. Do not take it more often than directed. Take all of your medicine as directed even if you think you are better. Do not skip doses or stop your medicine early. Talk to your pediatrician regarding the use of this medicine in children. While this drug may be prescribed for selected conditions, precautions do apply. Overdosage: If you think you have taken too much of this medicine contact a poison control center or emergency room at once. NOTE: This medicine is only for you. Do not share this medicine with others. What if I miss a dose? If you miss a dose, take it as soon as you can. If it is almost time for your next dose, take only that dose. Do not take double or extra doses. What may interact with this medicine? Do not take this medicine with any of the following medications:  cisapride  dronedarone  pimozide  thioridazine This medicine may also interact with the following medications:  ampicillin  antacids  cimetidine  cyclosporine  digoxin  kaolin  medicines for diabetes, like insulin, glipizide, glyburide  medicines for seizures like carbamazepine, phenobarbital, phenytoin  mefloquine  methotrexate  other medicines that prolong the QT interval (cause an abnormal heart rhythm)  praziquantel This list may not describe all possible interactions. Give your health  care provider a list of all the medicines, herbs, non-prescription drugs, or dietary supplements you use. Also tell them if you smoke, drink alcohol, or use illegal drugs. Some items may interact with your medicine. What should I watch for while using this medicine? Visit your health care professional for regular checks on your progress. Tell your health care professional if your  symptoms do not start to get better or if they get worse. You may need blood work done while you are taking this medicine. If you take other medicines that can affect heart rhythm, you may need more testing. Talk to your health care professional if you have questions. Your vision may be tested before and during use of this medicine. Tell your health care professional right away if you have any change in your eyesight. What side effects may I notice from receiving this medicine? Side effects that you should report to your doctor or health care professional as soon as possible:  allergic reactions like skin rash, itching or hives, swelling of the face, lips, or tongue  changes in vision  decreased hearing or ringing of the ears  muscle weakness  redness, blistering, peeling or loosening of the skin, including inside the mouth  sensitivity to light  signs and symptoms of a dangerous change in heartbeat or heart rhythm like chest pain; dizziness; fast or irregular heartbeat; palpitations; feeling faint or lightheaded, falls; breathing problems  signs and symptoms of liver injury like dark yellow or brown urine; general ill feeling or flu-like symptoms; light-colored stools; loss of appetite; nausea; right upper belly pain; unusually weak or tired; yellowing of the eyes or skin  signs and symptoms of low blood sugar such as feeling anxious; confusion; dizziness; increased hunger; unusually weak or tired; sweating; shakiness; cold; irritable; headache; blurred vision; fast heartbeat; loss of consciousness  suicidal thoughts  uncontrollable head, mouth, neck, arm, or leg movements Side effects that usually do not require medical attention (report to your doctor or health care professional if they continue or are bothersome):  diarrhea  dizziness  hair loss  headache  irritable  loss of appetite  nausea, vomiting  stomach pain This list may not describe all possible side effects.  Call your doctor for medical advice about side effects. You may report side effects to FDA at 1-800-FDA-1088. Where should I keep my medicine? Keep out of the reach of children. Store at room temperature between 15 and 30 degrees C (59 and 86 degrees F). Protect from moisture and light. Throw away any unused medicine after the expiration date. NOTE: This sheet is a summary. It may not cover all possible information. If you have questions about this medicine, talk to your doctor, pharmacist, or health care provider.  2020 [employer]/Gold Standard (2019-02-27 12:56:32)

## 2019-09-27 ENCOUNTER — Ambulatory Visit: Payer: 59 | Admitting: Family Medicine

## 2019-10-01 ENCOUNTER — Encounter: Payer: Self-pay | Admitting: Family Medicine

## 2019-10-03 ENCOUNTER — Ambulatory Visit: Payer: 59 | Admitting: Rheumatology

## 2019-10-04 ENCOUNTER — Other Ambulatory Visit: Payer: Self-pay | Admitting: Family Medicine

## 2019-10-04 MED FILL — VIT D2 1.25 MG (50,000 UNIT: 1.25 MG | 84 days supply | Qty: 12 | Fill #1

## 2019-10-04 MED FILL — LOSARTAN POTASSIUM 50 MG TA: 50 | 60 days supply | Qty: 120 | Fill #1

## 2019-10-04 MED FILL — DOXYCYCLINE HYC 20 MG TAB: 20 | 90 days supply | Qty: 180 | Fill #1

## 2019-10-04 MED FILL — POTASSIUM CHLORIDE CRYS ER: 20 MEQ | 60 days supply | Qty: 120 | Fill #4

## 2019-10-04 MED FILL — FUROSEMIDE 20 MG TABS: 20 | 30 days supply | Qty: 30 | Fill #2

## 2019-10-04 MED FILL — SSS 10-5 FOAM: 10-5 | 30 days supply | Qty: 60 | Fill #1

## 2019-10-04 MED FILL — valACYclovir HCL 1 GM TABS: 1 | 40 days supply | Qty: 80 | Fill #0

## 2019-10-05 MED FILL — CHLORHEXIDINE 0.12% RINSE: 0.12 | 14 days supply | Qty: 473 | Fill #1

## 2019-10-16 ENCOUNTER — Encounter: Payer: Self-pay | Admitting: Rheumatology

## 2019-10-16 MED FILL — HYDROXYCHLOROQUINE 200 MG T: 200 | 30 days supply | Qty: 60 | Fill #1

## 2019-10-16 MED FILL — metFORMIN HCL ER 500 MG TB2: 500 | 30 days supply | Qty: 60 | Fill #1

## 2019-10-16 MED FILL — DESONIDE 0.05 % CREA: 0.05 | 30 days supply | Qty: 60 | Fill #3

## 2019-10-16 MED FILL — HYDROCHLOROTHIAZIDE 25 MG T: 25 | 30 days supply | Qty: 30 | Fill #0

## 2019-10-17 ENCOUNTER — Ambulatory Visit: Payer: 59 | Admitting: Rheumatology

## 2019-10-17 ENCOUNTER — Telehealth (INDEPENDENT_AMBULATORY_CARE_PROVIDER_SITE_OTHER): Payer: 59 | Admitting: Rheumatology

## 2019-10-17 ENCOUNTER — Other Ambulatory Visit: Payer: Self-pay

## 2019-10-17 ENCOUNTER — Encounter: Payer: Self-pay | Admitting: Rheumatology

## 2019-10-17 DIAGNOSIS — M359 Systemic involvement of connective tissue, unspecified: Secondary | ICD-10-CM

## 2019-10-17 DIAGNOSIS — F418 Other specified anxiety disorders: Secondary | ICD-10-CM

## 2019-10-17 DIAGNOSIS — R5383 Other fatigue: Secondary | ICD-10-CM

## 2019-10-17 DIAGNOSIS — Z87891 Personal history of nicotine dependence: Secondary | ICD-10-CM

## 2019-10-17 DIAGNOSIS — M5136 Other intervertebral disc degeneration, lumbar region: Secondary | ICD-10-CM

## 2019-10-17 DIAGNOSIS — E0842 Diabetes mellitus due to underlying condition with diabetic polyneuropathy: Secondary | ICD-10-CM | POA: Diagnosis not present

## 2019-10-17 DIAGNOSIS — Z79899 Other long term (current) drug therapy: Secondary | ICD-10-CM

## 2019-10-17 DIAGNOSIS — E559 Vitamin D deficiency, unspecified: Secondary | ICD-10-CM

## 2019-10-17 DIAGNOSIS — R700 Elevated erythrocyte sedimentation rate: Secondary | ICD-10-CM | POA: Diagnosis not present

## 2019-10-17 DIAGNOSIS — G2581 Restless legs syndrome: Secondary | ICD-10-CM

## 2019-10-17 DIAGNOSIS — M51369 Other intervertebral disc degeneration, lumbar region without mention of lumbar back pain or lower extremity pain: Secondary | ICD-10-CM

## 2019-10-17 DIAGNOSIS — J4541 Moderate persistent asthma with (acute) exacerbation: Secondary | ICD-10-CM

## 2019-10-17 DIAGNOSIS — I10 Essential (primary) hypertension: Secondary | ICD-10-CM

## 2019-10-17 DIAGNOSIS — F900 Attention-deficit hyperactivity disorder, predominantly inattentive type: Secondary | ICD-10-CM

## 2019-10-17 DIAGNOSIS — K219 Gastro-esophageal reflux disease without esophagitis: Secondary | ICD-10-CM

## 2019-10-17 MED ORDER — ONDANSETRON HCL 4 MG PO TABS: 4.0000 mg | ORAL_TABLET | Freq: Four times a day (QID) | ORAL | 0 refills | Status: DC

## 2019-10-17 MED FILL — ONDANSETRON HCL 4 MG TABLET: 4 | 15 days supply | Qty: 60 | Fill #0

## 2019-10-17 NOTE — Progress Notes (Signed)
Virtual Visit via Telephone Note  I connected with Vanessa Reeves on 10/17/19 at  3:45 PM EST by telephone and verified that I am speaking with the correct person using two identifiers.  Location: Patient: Home  Provider: Clinic  This service was conducted via virtual visit.  The patient was located at home. I was located in my office.  Consent was obtained prior to the virtual visit and is aware of possible charges through their insurance for this visit.  The patient is an established patient.  Dr. Estanislado Pandy, MD conducted the virtual visit and Hazel Sams, PA-C acted as scribe during the service.  Office staff helped with scheduling follow up visits after the service was conducted.     I discussed the limitations, risks, security and privacy concerns of performing an evaluation and management service by telephone and the availability of in person appointments. I also discussed with the patient that there may be a patient responsible charge related to this service. The patient expressed understanding and agreed to proceed.  CC: Discuss medication options  History of Present Illness: Patient is a 48 year old female with a past medical history of autoimmune disease and DDD.  She was started on Plaquenil 200 mg 1 tablet BID after her office visit on 09/22/19.  She states she has been experiencing increased fatigue and nausea since starting on PLQ. She is requesting a prescription for zofran.  She is apprehensive to try an alternative to PLQ due to increased potential side effects.  She has not noticed much improvement in her symptoms since starting on PLQ but has only been taking it for 3 weeks.  She continues to have arthralgias but no joint swelling.  She has chronic lower back pain due to DDD.  She reports persistent sicca symptoms and recurrent oral ulcerations.  She has not had any symptoms of Raynaud's recently.  She denies any recent rashes.   Review of Systems  Constitutional: Positive for  malaise/fatigue. Negative for fever.  Eyes: Negative for photophobia, pain, discharge and redness.  Respiratory: Negative for cough, shortness of breath and wheezing.   Cardiovascular: Negative for chest pain and palpitations.  Gastrointestinal: Positive for nausea (SE of PLQ). Negative for blood in stool, constipation, diarrhea and vomiting.  Genitourinary: Negative for dysuria.  Musculoskeletal: Positive for back pain and joint pain. Negative for myalgias and neck pain.       +Morning stiffness   Skin: Negative for rash.  Neurological: Negative for dizziness and headaches.  Psychiatric/Behavioral: Negative for depression. The patient has insomnia. The patient is not nervous/anxious.       Observations/Objective: Physical Exam  Constitutional: She is oriented to person, place, and time.  Neurological: She is alert and oriented to person, place, and time.  Psychiatric: Mood, memory, affect and judgment normal.   Patient reports morning stiffness for 15  minutes.   Patient denies nocturnal pain.  Difficulty dressing/grooming: Denies Difficulty climbing stairs: Denies Difficulty getting out of chair: Denies Difficulty using hands for taps, buttons, cutlery, and/or writing: Denies   Assessment and Plan: Visit Diagnoses: Autoimmune disease ([place]) - ANA 1: 160 nuclear homogenous, anti-Ro positive, ESR 58, Oral ulcers, Raynaud's, photosensitivity, arthralgias, family history of lupus-maternal grandmother: She was started on Plaquenil 200 mg 1 tablet by mouth twice daily after her office visit on 09/22/19.  She has been experiencing nausea and fatigue since starting on PLQ.  She was advised to reduce the dose of PLQ to 200 mg 1 tablet daily, and we  will send in a prescription for Zofran 4 mg po every 6 hours as needed for nausea and vomiting.  She was advised to notify us if she cannot tolerate the reduced dose of PLQ.  She has not noticed clinical improvement with PLQ since she only started  taking it 3 weeks ago.   She has recurrent oral ulcerations but no nasal ulcerations. She has chronic sicca symptoms, so we discussed using OTC products and a humidifier for symptomatic relief. She reports ongoing arthralgias but no inflammation.  She has not had any symptoms of raynaud's or rashes recently.  She was advised to notify us if she develops any new or worsening symptoms.  She will taking PLQ 200 mg 1 tablet daily.  She is updating lab work Architectural technologist.  She will follow up in 3 months.   High risk medication use -She was started on Plaquenil 200 mg 1 tablet by mouth BID after her office visit on 09/22/19. She has been experiencing increased fatigue and nausea since starting on PLQ.  She was advised to reduce PLQ to 200 mg 1 tablet daily. She requested a prescription for zofran 4 mg 1 tablet every 6 hours as needed for nausea and vomiting.  She will continue taking PLQ for now but she was advised to notify us if she cannot tolerate PLQ at the reduced dose.    Elevated sed rate - Repeat sed rate 58, SPEP-acute inflammatory pattern.   Other fatigue: She has been experiencing increased fatigue since starting on PLQ.   DDD (degenerative disc disease), lumbar: She has chronic lower back pain.    Vitamin D deficiency - August 07, 2019 vitamin D 14.58.  She is taking vitamin D 50,000 units 1 capsule by mouth once weekly.  Chronic SI joint pain: She has intermittent SI joint pain bilaterally.    Other medical conditions are listed as follows:  Diabetic polyneuropathy associated with diabetes mellitus due to underlying condition ([place])  Essential hypertension  Gastroesophageal reflux disease without esophagitis  Moderate persistent reactive airway disease with acute exacerbation  RLS (restless legs syndrome)  Depression with anxiety  ADHD (attention deficit hyperactivity disorder), inattentive type  Memory changes  Former smoker  Follow Up Instructions: She will  follow up in 3 months.    I discussed the assessment and treatment plan with the patient. The patient was provided an opportunity to ask questions and all were answered. The patient agreed with the plan and demonstrated an understanding of the instructions.   The patient was advised to call back or seek an in-person evaluation if the symptoms worsen or if the condition fails to improve as anticipated.  I provided 25 minutes of non-face-to-face time during this encounter.   Bo Merino, MD   Scribed by-  Hazel Sams, PA-C

## 2019-10-17 NOTE — Addendum Note (Signed)
Addended by: Shona Needles on: 10/17/2019 04:30 PM   Modules accepted: Orders

## 2019-10-23 NOTE — Telephone Encounter (Signed)
Called patient to discuss concerns.  Patient states that she works on her feet all day and currently takes Tylenol for pain.  Our office advised her to avoid NSAIDs and Tylenol and if she wants to know if ibuprofen would be a better option.  Advised patient that ibuprofen has more side effects than Tylenol including increased blood pressure, kidney damage, and risk for GI bleed when taken chronically long-term.  Advised that Tylenol tends to be the safer option but the biggest concern is liver damage.  She is currently on losartan, HC TZ, and Lasix.  Adding ibuprofen will increase risk of kidney damage.  Patient verbalized understanding.  Patient asked what would be a safe dose of Tylenol to continue until she notices a response from Plaquenil.  Advised patient to not exceed 3000 mg daily.  Advised to try 500 mg twice a day to see if that alleviates her symptoms.  Patient verbalized understanding.  All questions encouraged and answered.  Instructed patient to call with any other questions or concerns.  Mariella Saa, PharmD, [place], [place] [facility] Pharmacist [phone]  10/23/2019 9:07 AM

## 2019-10-30 ENCOUNTER — Encounter: Payer: 59 | Admitting: Internal Medicine

## 2019-10-30 ENCOUNTER — Other Ambulatory Visit: Payer: Self-pay

## 2019-10-30 ENCOUNTER — Ambulatory Visit (INDEPENDENT_AMBULATORY_CARE_PROVIDER_SITE_OTHER): Payer: 59 | Admitting: Family Medicine

## 2019-10-30 ENCOUNTER — Encounter: Payer: Self-pay | Admitting: Family Medicine

## 2019-10-30 DIAGNOSIS — J4541 Moderate persistent asthma with (acute) exacerbation: Secondary | ICD-10-CM | POA: Diagnosis not present

## 2019-10-30 MED ORDER — METHYLPREDNISOLONE 4 MG PO TBPK: ORAL_TABLET | ORAL | 0 refills | Status: DC

## 2019-10-30 MED ORDER — FLOVENT HFA 110 MCG/ACT IN AERO: 1.0000 | INHALATION_SPRAY | Freq: Two times a day (BID) | RESPIRATORY_TRACT | 0 refills | Status: DC

## 2019-10-30 MED FILL — FLOVENT HFA 110 MCG INHALER: 110 | 60 days supply | Qty: 12 | Fill #0

## 2019-10-30 MED FILL — SSS 10-5 FOAM: 10-5 | 30 days supply | Qty: 60 | Fill #2

## 2019-10-30 MED FILL — METHYLPREDNISOLONE 4 MG TBP: 4 | 6 days supply | Qty: 21 | Fill #0

## 2019-10-30 NOTE — Progress Notes (Signed)
Subjective:    Patient ID: Vanessa Reeves, female    DOB: 01/19/1971, 48 y.o.   MRN: [id number]  HPI Here for one week of an asthma flare. She has mild asthma, but this winter it seems to have worsened a bit. She has had to use her Proair inhaler several times a week. This week this has not helped much, and she has felt moderately SOB for a week. Some coughing, no chest pain or fever. She works at a health care facility, and she gets tested every Monday for the Covid-19 virus. She has always tested negative, including this morning.  Virtual Visit via Video Note  I connected with the patient on 10/30/19 at  1:15 PM EST by a video enabled telemedicine application and verified that I am speaking with the correct person using two identifiers.  Location patient: home Location provider:work or home office Persons participating in the virtual visit: patient, provider  I discussed the limitations of evaluation and management by telemedicine and the availability of in person appointments. The patient expressed understanding and agreed to proceed.   HPI:    ROS: See pertinent positives and negatives per HPI.  Past Medical History:  Diagnosis Date  . Anxiety   . Diabetes mellitus without complication ([place])   . Herpes   . Hypertension   . RLS (restless legs syndrome) 03/17/2017   Right leg    Past Surgical History:  Procedure Laterality Date  . ABDOMINAL HYSTERECTOMY    . DILATION AND CURETTAGE OF UTERUS    . TUBAL LIGATION      Family History  Problem Relation Age of Onset  . Hypertension Mother   . Stroke Father   . Hypertension Paternal Grandmother   . Diabetes Mellitus II Paternal Grandmother   . Healthy Brother   . Healthy Daughter   . Healthy Son      Current Outpatient Medications:  .  Accu-Chek FastClix Lancets MISC, USE ONE LANCET TWICE DAILY EACH TIME SUGARS ARE CHECKED., Disp: 102 each, Rfl: 12 .  ACCU-CHEK GUIDE test strip, USE AS INSTRUCTED TO TEST YOUR  SUGARS TWICE DAILY., Disp: 100 strip, Rfl: 12 .  acetaminophen (TYLENOL) 325 MG tablet, Take 2 tablets (650 mg total) by mouth every 6 (six) hours as needed., Disp: 90 tablet, Rfl: 3 .  Albuterol Sulfate (PROAIR RESPICLICK) 123XX123 (90 Base) MCG/ACT AEPB, Inhale 1-2 puffs into the lungs every 6 (six) hours as needed., Disp: 1 each, Rfl: 1 .  ALL DAY ALLERGY 10 MG tablet, TAKE 1 TABLET BY MOUTH ONCE DAILY, Disp: 30 tablet, Rfl: 11 .  atenolol (TENORMIN) 50 MG tablet, TAKE 2 TABLETS BY MOUTH AT BEDTIME., Disp: 180 tablet, Rfl: 1 .  chlorhexidine (PERIDEX) 0.12 % solution, , Disp: , Rfl:  .  desonide (DESOWEN) 0.05 % cream, APPLY TO THE AFFECTED AREA(S) TWICE DAILY, Disp: 60 g, Rfl: 3 .  doxycycline (PERIOSTAT) 20 MG tablet, , Disp: , Rfl:  .  escitalopram (LEXAPRO) 20 MG tablet, TAKE 1 TABLET (20 MG TOTAL) BY MOUTH DAILY. PLEASE SCHEDULE FOLLOW UP APPT WITH DR. TABORI FOR FURTHER REFILLS., Disp: 30 tablet, Rfl: 0 .  fluticasone (FLONASE) 50 MCG/ACT nasal spray, PLACE 2 SPRAYS INTO BOTH NOSTRILS DAILY., Disp: 16 g, Rfl: 6 .  fluticasone (FLOVENT HFA) 110 MCG/ACT inhaler, Inhale 1 puff into the lungs 2 (two) times daily., Disp: 1 Inhaler, Rfl: 0 .  furosemide (LASIX) 20 MG tablet, TAKE 1 TABLET BY MOUTH ONCE A DAY, Disp: 30 tablet, Rfl:  3 .  hydrochlorothiazide (HYDRODIURIL) 25 MG tablet, TAKE 1 TABLET (25 MG TOTAL) BY MOUTH DAILY. PLEASE SCHEDULE FOLLOW UP APPT WITH DR. TABORI FOR FURTHER REFILLS., Disp: 30 tablet, Rfl: 0 .  hydroxychloroquine (PLAQUENIL) 200 MG tablet, Take 1 tablet (200 mg total) by mouth 2 (two) times daily., Disp: 120 tablet, Rfl: 0 .  losartan (COZAAR) 50 MG tablet, Take 2 tablets (100 mg total) by mouth daily., Disp: 60 tablet, Rfl: 3 .  metFORMIN (GLUCOPHAGE-XR) 500 MG 24 hr tablet, TAKE 1 TABLET BY MOUTH TWICE DAILY WITH BREAKFAST, Disp: 60 tablet, Rfl: 1 .  methylPREDNISolone (MEDROL DOSEPAK) 4 MG TBPK tablet, As directed, Disp: 21 tablet, Rfl: 0 .  montelukast (SINGULAIR) 10 MG  tablet, Take 1 tablet (10 mg total) by mouth at bedtime., Disp: 30 tablet, Rfl: 3 .  mupirocin ointment (BACTROBAN) 2 %, Apply 1 application topically 2 (two) times daily., Disp: 30 g, Rfl: 3 .  ondansetron (ZOFRAN ODT) 8 MG disintegrating tablet, Take 1 tablet (8 mg total) by mouth every 8 (eight) hours as needed for nausea or vomiting., Disp: 10 tablet, Rfl: 0 .  ondansetron (ZOFRAN) 4 MG tablet, Take 1 tablet (4 mg total) by mouth every 6 (six) hours., Disp: 60 tablet, Rfl: 0 .  pantoprazole (PROTONIX) 40 MG tablet, Take 1 tablet (40 mg total) by mouth daily., Disp: 30 tablet, Rfl: 3 .  potassium chloride SA (K-DUR,KLOR-CON) 20 MEQ tablet, TAKE 2 TABLETS BY MOUTH DAILY, Disp: 60 tablet, Rfl: 6 .  ranitidine (ZANTAC) 300 MG tablet, TAKE 1 TABLET BY MOUTH AT BEDTIME., Disp: 30 tablet, Rfl: 6 .  SSS 10-5 10-5 % FOAM, APPLY TO THE AFFECTED AREA ONCE DAILY, Disp: 60 g, Rfl: 3 .  triazolam (HALCION) 0.25 MG tablet, , Disp: , Rfl:  .  valACYclovir (VALTREX) 1000 MG tablet, TAKE 1 TABLET BY MOUTH TWICE A DAY, Disp: 20 tablet, Rfl: 3 .  Vitamin D, Ergocalciferol, (DRISDOL) 1.25 MG (50000 UT) CAPS capsule, Take 1 capsule (50,000 Units total) by mouth every 7 (seven) days. Take 1 capsule (50,000 units total) by mouth every 7 days for 12 weeks, Disp: 4 capsule, Rfl: 4  EXAM:  VITALS per patient if applicable:  GENERAL: alert, oriented, appears well and in no acute distress  HEENT: atraumatic, conjunttiva clear, no obvious abnormalities on inspection of external nose and ears  NECK: normal movements of the head and neck  LUNGS: on inspection no signs of respiratory distress, breathing rate appears normal, no obvious gross SOB, gasping or wheezing  CV: no obvious cyanosis  MS: moves all visible extremities without noticeable abnormality  PSYCH/NEURO: pleasant and cooperative, no obvious depression or anxiety, speech and thought processing grossly intact  ASSESSMENT AND PLAN: Her asthma has  worsened a bit, so we will treat her with a Medrol dose pack. Also we will start her on Flovent BID for maintenance. She can add Proair as needed. Follow up with Dr. Birdie Riddle prn.  Alysia Penna, MD  Discussed the following assessment and plan:  No diagnosis found.     I discussed the assessment and treatment plan with the patient. The patient was provided an opportunity to ask questions and all were answered. The patient agreed with the plan and demonstrated an understanding of the instructions.   The patient was advised to call back or seek an in-person evaluation if the symptoms worsen or if the condition fails to improve as anticipated.    Review of Systems     Objective:  Physical Exam        Assessment & Plan:

## 2019-11-22 NOTE — Progress Notes (Signed)
This encounter was created in error - please disregard.

## 2019-12-13 ENCOUNTER — Encounter: Payer: Self-pay | Admitting: Family Medicine

## 2019-12-27 MED FILL — FUROSEMIDE 20 MG TABS: 20 | 30 days supply | Qty: 30 | Fill #3

## 2019-12-27 MED FILL — LOSARTAN POTASSIUM 50 MG TA: 50 | 30 days supply | Qty: 60 | Fill #2

## 2020-01-02 ENCOUNTER — Encounter: Payer: Self-pay | Admitting: Rheumatology

## 2020-01-16 ENCOUNTER — Ambulatory Visit: Payer: 59 | Admitting: Rheumatology

## 2020-02-15 ENCOUNTER — Ambulatory Visit: Payer: Self-pay | Admitting: Family Medicine

## 2020-02-21 ENCOUNTER — Other Ambulatory Visit: Payer: Self-pay | Admitting: Family Medicine

## 2020-02-21 ENCOUNTER — Other Ambulatory Visit: Payer: Self-pay

## 2020-02-21 ENCOUNTER — Ambulatory Visit: Payer: PRIVATE HEALTH INSURANCE | Admitting: Family Medicine

## 2020-02-21 ENCOUNTER — Encounter: Payer: Self-pay | Admitting: Family Medicine

## 2020-02-21 VITALS — BP 133/83 | HR 90 | Temp 97.9°F | Resp 16 | Ht 65.0 in | Wt 308.4 lb

## 2020-02-21 DIAGNOSIS — I10 Essential (primary) hypertension: Secondary | ICD-10-CM

## 2020-02-21 DIAGNOSIS — E559 Vitamin D deficiency, unspecified: Secondary | ICD-10-CM

## 2020-02-21 DIAGNOSIS — G4700 Insomnia, unspecified: Secondary | ICD-10-CM

## 2020-02-21 DIAGNOSIS — M51369 Other intervertebral disc degeneration, lumbar region without mention of lumbar back pain or lower extremity pain: Secondary | ICD-10-CM | POA: Insufficient documentation

## 2020-02-21 DIAGNOSIS — E1169 Type 2 diabetes mellitus with other specified complication: Secondary | ICD-10-CM | POA: Diagnosis not present

## 2020-02-21 DIAGNOSIS — E1142 Type 2 diabetes mellitus with diabetic polyneuropathy: Secondary | ICD-10-CM

## 2020-02-21 DIAGNOSIS — E0842 Diabetes mellitus due to underlying condition with diabetic polyneuropathy: Secondary | ICD-10-CM

## 2020-02-21 DIAGNOSIS — M5136 Other intervertebral disc degeneration, lumbar region: Secondary | ICD-10-CM

## 2020-02-21 DIAGNOSIS — E785 Hyperlipidemia, unspecified: Secondary | ICD-10-CM

## 2020-02-21 LAB — BASIC METABOLIC PANEL
BUN: 19 mg/dL (ref 6–23)
CO2: 32 mEq/L (ref 19–32)
Calcium: 10.2 mg/dL (ref 8.4–10.5)
Chloride: 97 mEq/L (ref 96–112)
Creatinine, Ser: 0.88 mg/dL (ref 0.40–1.20)
GFR: 83.22 mL/min (ref >60.00)
Glucose, Bld: 95 mg/dL (ref 70–99)
Potassium: 3.6 mEq/L (ref 3.5–5.1)
Sodium: 137 mEq/L (ref 135–145)

## 2020-02-21 LAB — TSH: TSH: 2.57 u[IU]/mL (ref 0.35–4.50)

## 2020-02-21 LAB — HEPATIC FUNCTION PANEL
ALT: 10 U/L (ref 0–35)
AST: 11 U/L (ref 0–37)
Albumin: 4.2 g/dL (ref 3.5–5.2)
Alkaline Phosphatase: 74 U/L (ref 39–117)
Bilirubin, Direct: 0.1 mg/dL (ref 0.0–0.3)
Total Bilirubin: 0.3 mg/dL (ref 0.2–1.2)
Total Protein: 7.8 g/dL (ref 6.0–8.3)

## 2020-02-21 LAB — VITAMIN D 25 HYDROXY (VIT D DEFICIENCY, FRACTURES): VITD: 27.59 ng/mL — ABNORMAL LOW (ref 30.00–100.00)

## 2020-02-21 LAB — LIPID PANEL
Cholesterol: 141 mg/dL (ref 0–200)
HDL: 58.6 mg/dL (ref >39.00)
LDL Cholesterol: 69 mg/dL (ref 0–99)
NonHDL: 82.87
Total CHOL/HDL Ratio: 2
Triglycerides: 68 mg/dL (ref 0.0–149.0)
VLDL: 13.6 mg/dL (ref 0.0–40.0)

## 2020-02-21 LAB — HEMOGLOBIN A1C: Hgb A1c MFr Bld: 6.2 % (ref 4.6–6.5)

## 2020-02-21 MED ORDER — LOSARTAN POTASSIUM 50 MG PO TABS: 100.0000 mg | ORAL_TABLET | Freq: Every day | ORAL | 1 refills | Status: DC

## 2020-02-21 MED ORDER — FUROSEMIDE 20 MG PO TABS: 20.0000 mg | ORAL_TABLET | Freq: Every day | ORAL | 1 refills | Status: DC

## 2020-02-21 MED ORDER — TRAZODONE HCL 50 MG PO TABS: 25.0000 mg | ORAL_TABLET | Freq: Every evening | ORAL | 3 refills | Status: DC | PRN

## 2020-02-21 MED ORDER — ATENOLOL 50 MG PO TABS: 100.0000 mg | ORAL_TABLET | Freq: Every day | ORAL | 1 refills | Status: DC

## 2020-02-21 MED ORDER — HYDROCHLOROTHIAZIDE 25 MG PO TABS: 25.0000 mg | ORAL_TABLET | Freq: Every day | ORAL | 1 refills | Status: DC

## 2020-02-21 MED ORDER — METFORMIN HCL ER 500 MG PO TB24: ORAL_TABLET | ORAL | 1 refills | Status: DC

## 2020-02-21 MED FILL — FUROSEMIDE 20 MG TABS: 20 | 30 days supply | Qty: 30 | Fill #0

## 2020-02-21 MED FILL — ATENOLOL 50 MG TABLET: 50 | 30 days supply | Qty: 60 | Fill #0

## 2020-02-21 MED FILL — LOSARTAN POTASSIUM 50 MG TA: 50 | 30 days supply | Qty: 60 | Fill #0

## 2020-02-21 MED FILL — traZODone HCL 50 MG TABS: 50 | 30 days supply | Qty: 30 | Fill #0

## 2020-02-21 MED FILL — metFORMIN HCL ER 500 MG TB2: 500 | 30 days supply | Qty: 60 | Fill #0

## 2020-02-21 MED FILL — HYDROCHLOROTHIAZIDE 25 MG T: 25 | 30 days supply | Qty: 30 | Fill #0

## 2020-02-21 NOTE — Assessment & Plan Note (Signed)
Chronic problem for pt.  Not taking statin as directed.  Check labs and start meds prn.

## 2020-02-21 NOTE — Assessment & Plan Note (Signed)
Chronic problem.  Well controlled.  Currently asymptomatic.  Check labs.  Refills provided.

## 2020-02-21 NOTE — Progress Notes (Signed)
   Subjective:    Patient ID: Vanessa Reeves, female    DOB: 1971/07/14, 49 y.o.   MRN: [id number]  HPI HTN- chronic problem, on Atenolol 50mg  daily, Lasix 25mg , HCTZ 25mg  daily, Losartan 50mg  daily w/ adequate control.  No CP, SOB, HAs, visual changes.  DM- chronic problem, on Metformin 500mg  BID.  On ARB for renal protection.  Due for eye exam, foot exam.  Last A1C 6.9 in August.  Denies symptomatic lows.  'my sugar has been out of wack'  Hyperlipidemia- chronic problem, on Lipitor 40mg  daily but not taking.  No abd pain.  + nausea w/ plaquenil  Obesity- BMI is 51.32  Has just started going to the gym.  Insomnia- pt reports no relief w/ OTC products.  Has had issues w/ sleep in the past.  Trouble both falling and staying asleep.  DDD- pt is asking for referral back to radiology for back injxn   Review of Systems For ROS see HPI   This visit occurred during the SARS-CoV-2 public health emergency.  Safety protocols were in place, including screening questions prior to the visit, additional usage of staff PPE, and extensive cleaning of exam room while observing appropriate contact time as indicated for disinfecting solutions.       Objective:   Physical Exam Vitals reviewed.  Constitutional:      General: She is not in acute distress.    Appearance: She is well-developed. She is obese.  HENT:     Head: Normocephalic and atraumatic.  Eyes:     Conjunctiva/sclera: Conjunctivae normal.     Pupils: Pupils are equal, round, and reactive to light.  Neck:     Thyroid: No thyromegaly.  Cardiovascular:     Rate and Rhythm: Normal rate and regular rhythm.     Heart sounds: Normal heart sounds. No murmur.  Pulmonary:     Effort: Pulmonary effort is normal. No respiratory distress.     Breath sounds: Normal breath sounds.  Abdominal:     General: There is no distension.     Palpations: Abdomen is soft.     Tenderness: There is no abdominal tenderness.  Musculoskeletal:   Cervical back: Normal range of motion and neck supple.  Lymphadenopathy:     Cervical: No cervical adenopathy.  Skin:    General: Skin is warm and dry.  Neurological:     Mental Status: She is alert and oriented to person, place, and time.  Psychiatric:        Behavior: Behavior normal.           Assessment & Plan:

## 2020-02-21 NOTE — Assessment & Plan Note (Signed)
New.  Pt reports difficulty both falling and staying asleep.  Her sxs used to respond to OTC meds but these are no longer effective.  Start Trazodone and monitor for improvement

## 2020-02-21 NOTE — Assessment & Plan Note (Signed)
Ongoing issue.  Pt reports her sugars have been labile due to the fact that she has tried to make her metformin stretch over the time she did not have insurance.  Pt to schedule eye exam.  Foot exam done today.  Check labs.  Adjust meds prn

## 2020-02-21 NOTE — Assessment & Plan Note (Signed)
Ongoing issue for pt.  Again discussed need for healthy diet and regular exercise.  Will follow.

## 2020-02-21 NOTE — Assessment & Plan Note (Signed)
Pt asking for repeat IR referral for SI injxn

## 2020-02-21 NOTE — Patient Instructions (Signed)
Follow up in 3-4 months to recheck diabetes We'll notify you of your lab results and make any changes if needed Continue to work on healthy diet and regular exercise- you can do it! Schedule an eye exam at your convenience START the Trazodone nightly for sleep.  Start w/ 1/2 tab and then increase to full tab if needed We'll call you with your radiology referral Call with any questions or concerns CONGRATS ON [place]!!!

## 2020-02-21 NOTE — Assessment & Plan Note (Signed)
Pt has hx of this.  Check labs and replete prn. 

## 2020-02-22 ENCOUNTER — Encounter: Payer: Self-pay | Admitting: Family Medicine

## 2020-02-22 ENCOUNTER — Other Ambulatory Visit: Payer: Self-pay | Admitting: Family Medicine

## 2020-02-22 DIAGNOSIS — M5136 Other intervertebral disc degeneration, lumbar region: Secondary | ICD-10-CM

## 2020-02-22 LAB — CBC WITH DIFFERENTIAL/PLATELET
Basophils Absolute: 0 10*3/uL (ref 0.0–0.1)
Basophils Relative: 0.4 % (ref 0.0–3.0)
Eosinophils Absolute: 0.1 10*3/uL (ref 0.0–0.7)
Eosinophils Relative: 1.5 % (ref 0.0–5.0)
HCT: 37.6 % (ref 36.0–46.0)
Hemoglobin: 12.2 g/dL (ref 12.0–15.0)
Lymphocytes Relative: 37.7 % (ref 12.0–46.0)
Lymphs Abs: 3.8 10*3/uL (ref 0.7–4.0)
MCHC: 32.6 g/dL (ref 30.0–36.0)
MCV: 86.4 fl (ref 78.0–100.0)
Monocytes Absolute: 1 10*3/uL (ref 0.1–1.0)
Monocytes Relative: 9.6 % (ref 3.0–12.0)
Neutro Abs: 5.1 10*3/uL (ref 1.4–7.7)
Neutrophils Relative %: 50.8 % (ref 43.0–77.0)
Platelets: 442 10*3/uL — ABNORMAL HIGH (ref 150.0–400.0)
RBC: 4.35 Mil/uL (ref 3.87–5.11)
RDW: 15.7 % — ABNORMAL HIGH (ref 11.5–15.5)
WBC: 10 10*3/uL (ref 4.0–10.5)

## 2020-02-23 ENCOUNTER — Other Ambulatory Visit: Payer: Self-pay | Admitting: Family Medicine

## 2020-02-23 ENCOUNTER — Telehealth: Payer: Self-pay | Admitting: Family Medicine

## 2020-02-23 ENCOUNTER — Ambulatory Visit: Payer: Self-pay | Admitting: Family Medicine

## 2020-02-23 MED ORDER — CONTOUR TEST VI STRP: ORAL_STRIP | 12 refills | Status: DC

## 2020-02-23 MED ORDER — SAFETY LANCETS 28G MISC: 12 refills | Status: DC

## 2020-02-23 MED ORDER — CONTOUR BLOOD GLUCOSE SYSTEM W/DEVICE KIT: PACK | 2 refills | Status: DC

## 2020-02-23 MED FILL — DESONIDE 0.05% CREAM: 0.05 | 30 days supply | Qty: 60 | Fill #0

## 2020-02-23 MED FILL — SSS 10-5 FOAM: 10-5 | 30 days supply | Qty: 60 | Fill #3

## 2020-02-23 NOTE — Telephone Encounter (Signed)
States patient has a new insurance and needs scripts for glucometer, lancets and strips to be sent over.  New preferred meter is contour.

## 2020-02-23 NOTE — Telephone Encounter (Signed)
Medication filled to pharmacy as requested.   

## 2020-02-29 ENCOUNTER — Ambulatory Visit: Payer: Self-pay | Admitting: Family Medicine

## 2020-03-01 NOTE — Progress Notes (Signed)
Office Visit Note  Patient: Vanessa Reeves             Date of Birth: 01/03/1971           MRN: [id number]             PCP: Midge Minium, MD Referring: Midge Minium, MD Visit Date: 03/08/2020 Occupation: '@GUAROCC'$ @  Subjective:  Medication monitoring   History of Present Illness: Vanessa Reeves is a 49 y.o. female with history of autoimmune disease.  She is taking plaquenil 200 mg 1 tablet by mouth twice daily.  She is tolerating PLQ without any side effects.  She has noticed significant improvement in her symptoms since starting on PLQ.  She continues to have chronic sicca symptoms, and she drinks lots of fluids, uses eyedrops, and nasal spray for symptomatic relief.  She has intermittent arthralgias but no joint swelling. She denies any oral or nasal ulcerations.  She denies any rashes or photosensitivity.  She tries to avoid sun exposure.    Activities of Daily Living:  Patient reports morning stiffness for 5-10 minutes.   Patient Reports nocturnal pain.  Difficulty dressing/grooming: Denies Difficulty climbing stairs: Denies Difficulty getting out of chair: Denies Difficulty using hands for taps, buttons, cutlery, and/or writing: Denies  Review of Systems  Constitutional: Positive for fatigue.  HENT: Positive for mouth dryness and nose dryness. Negative for mouth sores.   Eyes: Positive for dryness. Negative for pain and visual disturbance.  Respiratory: Negative for cough, hemoptysis, shortness of breath and difficulty breathing.   Cardiovascular: Negative for chest pain, palpitations, hypertension and swelling in legs/feet.  Gastrointestinal: Positive for constipation. Negative for blood in stool and diarrhea.  Endocrine: Negative for increased urination.  Genitourinary: Negative for difficulty urinating and painful urination.  Musculoskeletal: Positive for arthralgias, joint pain, myalgias, morning stiffness, muscle tenderness and myalgias. Negative for  joint swelling and muscle weakness.  Skin: Positive for hair loss. Negative for color change, pallor, rash, nodules/bumps, redness, skin tightness, ulcers and sensitivity to sunlight.  Allergic/Immunologic: Negative for susceptible to infections.  Neurological: Positive for headaches. Negative for dizziness and numbness.  Hematological: Negative for bruising/bleeding tendency and swollen glands.  Psychiatric/Behavioral: Negative for depressed mood, confusion and sleep disturbance. The patient is not nervous/anxious.     PMFS History:  Patient Active Problem List   Diagnosis Date Noted  . Hyperlipidemia associated with type 2 diabetes mellitus ([place]) 02/21/2020  . Insomnia 02/21/2020  . Vitamin D deficiency 02/21/2020  . Degenerative disc disease, lumbar 02/21/2020  . Diabetic polyneuropathy associated with diabetes mellitus due to underlying condition ([place]) 06/14/2019  . Visit for preventive health examination 04/11/2018  . Breast cancer screening 04/11/2018  . Memory changes 11/23/2017  . RLS (restless legs syndrome) 03/17/2017  . 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 Reactive airway disease 08/21/2016  . Pain in right lower leg 04/06/2016  . Gastroesophageal reflux disease without esophagitis 12/20/2015  . Possible exposure to STD 06/07/2015  . Physical exam 12/14/2014  . Urinary frequency 03/30/2014  . Breast mass, left 02/16/2014  . Edema 02/16/2014  . Chest pain 12/29/2013  . Hypokalemia 08/28/2013  . Depression with anxiety 07/17/2013  . Essential hypertension 07/17/2013  . Eustachian tube dysfunction 07/17/2013  . Severe obesity (BMI >= 40) ([place]) 07/17/2013    Past Medical History:  Diagnosis Date  . Anxiety   . Diabetes mellitus without complication ([place])   . Herpes   . Hypertension   . RLS (  restless legs syndrome) 03/17/2017   Right leg    Family History  Problem Relation Age of Onset  . Hypertension Mother   . Stroke  Father   . Hypertension Paternal Grandmother   . Diabetes Mellitus II Paternal Grandmother   . Healthy Daughter   . Healthy Son    Past Surgical History:  Procedure Laterality Date  . ABDOMINAL HYSTERECTOMY    . DILATION AND CURETTAGE OF UTERUS    . TUBAL LIGATION     Social History   Social History Narrative   Lives with husband and 2 children in a 2 story home.     Works as a Chartered certified accountant at [employer].     Highest level of education:  High school, in college now   Immunization History  Administered Date(s) Administered  . Influenza,inj,Quad PF,6+ Mos 07/17/2013, 07/27/2014, 07/26/2015, 08/21/2016, 07/06/2017, 07/29/2018, 07/03/2019  . MMR 03/08/2017, 04/05/2017  . PPD Test 07/26/2015, 03/07/2018, 05/23/2018  . Pneumococcal Polysaccharide-23 01/16/2015  . Tdap 10/14/2015     Objective: Vital Signs: BP (!) 162/93 (BP Location: Left Wrist, Patient Position: Sitting, Cuff Size: Normal)   Pulse (!) 58   Resp 15   Ht '5\' 5"'$  (1.651 m)   Wt (!) 312 lb 9.6 oz (141.8 kg)   LMP 12/04/2011   BMI 52.02 kg/m    Physical Exam Vitals and nursing note reviewed.  Constitutional:      Appearance: She is well-developed.  HENT:     Head: Normocephalic and atraumatic.  Eyes:     Conjunctiva/sclera: Conjunctivae normal.  Pulmonary:     Effort: Pulmonary effort is normal.  Abdominal:     General: Bowel sounds are normal.     Palpations: Abdomen is soft.  Musculoskeletal:     Cervical back: Normal range of motion.  Lymphadenopathy:     Cervical: No cervical adenopathy.  Skin:    General: Skin is warm and dry.     Capillary Refill: Capillary refill takes less than 2 seconds.  Neurological:     Mental Status: She is alert and oriented to person, place, and time.  Psychiatric:        Behavior: Behavior normal.      Musculoskeletal Exam: C-spine, thoracic spine, lumbar spine good range of motion.  No midline spinal tenderness.  Shoulder joints, elbow joints, wrist joints, MCPs,  PIPs, DIPs good range of motion with no synovitis.  She has complete fist formation bilaterally.  Hip joints, knee joints, ankle joints have good range of motion no tenderness or inflammation.  No warmth or effusion of knee joints noted.  CDAI Exam: CDAI Score: -- Patient Global: --; Provider Global: -- Swollen: --; Tender: -- Joint Exam 03/08/2020   No joint exam has been documented for this visit   There is currently no information documented on the homunculus. Go to the Rheumatology activity and complete the homunculus joint exam.  Investigation: No additional findings.  Imaging: DG Fluoro Guide Spinal/SI Jt Inj Right  Result Date: 03/05/2020 CLINICAL DATA:  Recurrent right sacroiliac pain. Excellent response to previous SI injections. FLUOROSCOPY TIME:  Fluoroscopy Time: 19 seconds Radiation Exposure Index: 39.81 microGray*m^2 PROCEDURE: RIGHT SI JOINT INJECTION. After a thorough discussion of risks and benefits of the procedure, including bleeding, infection, injury to nerves, blood vessels, and adjacent structures, verbal and written consent was obtained. The patient was placed prone on the fluoroscopy table and localization was performed over the sacrum. Target site marked using fluoroscopic guidance. The skin was prepped and  draped in the usual sterile fashion using Betadine soap. After local anesthesia with 1% lidocaine without epinephrine and subsequent deep anesthesia, a 5 inch 22 gauge spinal needle was advanced into the right SI joint. Injection of 0.5 ml Isovue-M 200 confirmed intra-articular placement. No vascular uptake present. Subsequently, 120 mg of Depo-Medrol and 1 mL of 0.5% bupivacaine were injected into the joint. The needle was removed and a sterile dressing applied. No complications were observed. The patient was observed and released under the care of a driver after 20 minutes. IMPRESSION: Successful fluoroscopically guided right SI joint injection. Electronically Signed    By: Logan Bores M.D.   On: 03/05/2020 10:26    Recent Labs: Lab Results  Component Value Date   WBC 10.0 02/21/2020   HGB 12.2 02/21/2020   PLT 442.0 (H) 02/21/2020   NA 137 02/21/2020   K 3.6 02/21/2020   CL 97 02/21/2020   CO2 32 02/21/2020   GLUCOSE 95 02/21/2020   BUN 19 02/21/2020   CREATININE 0.88 02/21/2020   BILITOT 0.3 02/21/2020   ALKPHOS 74 02/21/2020   AST 11 02/21/2020   ALT 10 02/21/2020   PROT 7.8 02/21/2020   ALBUMIN 4.2 02/21/2020   CALCIUM 10.2 02/21/2020   GFRAA >60 08/03/2019   QFTBGOLDPLUS NEGATIVE 07/03/2019    Speciality Comments: No specialty comments available.  Procedures:  No procedures performed Allergies: Penicillins   Assessment / Plan:     Visit Diagnoses: Autoimmune disease ([place]) - ANA 1: 160 nuclear homogenous, anti-Ro positive, ESR 58, Oral ulcers, Raynaud's, photosensitivity, arthralgias, family history of lupus-maternal grandmother: She continues to notice clinical improvement while taking Plaquenil.  She is taking Plaquenil 200 mg 1 tablet by mouth twice daily.  She is tolerating Plaquenil without any side effects.  She continues to have intermittent arthralgias but has no synovitis on exam.  We discussed the list of natural anti-inflammatories which she is going to start taking.  She has not had any recent rashes.  We discussed the importance of wearing sunscreen SPF greater than 50 on a daily basis and avoiding direct sun exposure.  She has not had any recent oral or nasal ulcerations.  She continues to have chronic sicca symptoms and uses over-the-counter products for symptomatic relief.  She has not had any symptoms of Raynaud's recently.  No digital ulcerations or signs of gangrene were noted.  She will continue taking Plaquenil 200 mg 1 tablet by mouth twice daily.  She does not need a refill at this time.  We will recheck autoimmune lab work today.  She was advised to notify us if she develops any new or worsening symptoms.  She will  follow-up in the office in 5 months.  High risk medication use - Plaquenil 200 mg 1 tablet twice daily.  She has an upcoming Plaquenil eye exam scheduled this month.  She was given a Plaquenil eye exam form to take with her to her upcoming appointment.  CBC was drawn on 02/21/2020.  She had a hepatic function panel and BMP on 02/21/2020.  She will continue to require lab work every 5 months to monitor for drug toxicity.  Elevated sed rate: Sed rate was 58 on 09/08/2019.  We will recheck sed rate today.  Other fatigue: She continues to have chronic fatigue.  She works third shift.  DDD (degenerative disc disease), lumbar: She is not experiencing any lower back pain at this time.  She has no symptoms of radiculopathy.  Vitamin D deficiency: She is  taking vitamin D 5000 units daily.  Vitamin D was 27.59 on 02/21/2020.  Other medical conditions are listed as follows:  Diabetic polyneuropathy associated with diabetes mellitus due to underlying condition ([place])  Essential hypertension  Gastroesophageal reflux disease without esophagitis  Moderate persistent reactive airway disease with acute exacerbation  Depression with anxiety  RLS (restless legs syndrome)  ADHD (attention deficit hyperactivity disorder), inattentive type  Former smoker  Orders: Orders Placed This Encounter  Procedures  . Anti-DNA antibody, double-stranded  . C3 and C4  . Sedimentation rate  . Urinalysis, Routine w reflex microscopic   No orders of the defined types were placed in this encounter.    Follow-Up Instructions: Return in about 5 months (around 08/08/2020) for Autoimmune Disease.   Ofilia Neas, PA-C  Note - This record has been created using Dragon software.  Chart creation errors have been sought, but may not always  have been located. Such creation errors do not reflect on  the standard of medical care.

## 2020-03-05 ENCOUNTER — Ambulatory Visit
Admission: RE | Admit: 2020-03-05 | Discharge: 2020-03-05 | Disposition: A | Discharge status: Home / Self Care | Payer: PRIVATE HEALTH INSURANCE | Source: Ambulatory Visit | Attending: Family Medicine | Admitting: Family Medicine

## 2020-03-05 ENCOUNTER — Other Ambulatory Visit: Payer: Self-pay

## 2020-03-05 DIAGNOSIS — M5136 Other intervertebral disc degeneration, lumbar region: Secondary | ICD-10-CM

## 2020-03-05 MED ORDER — METHYLPREDNISOLONE ACETATE 40 MG/ML INJ SUSP (RADIOLOG
120.0000 mg | Freq: Once | INTRAMUSCULAR | Status: AC
  Administered 2020-03-05: 10:00:00 120 mg via INTRA_ARTICULAR

## 2020-03-05 MED ORDER — IOPAMIDOL (ISOVUE-M 200) INJECTION 41%
1.0000 mL | Freq: Once | INTRAMUSCULAR | Status: AC
  Administered 2020-03-05: 1 mL via INTRA_ARTICULAR

## 2020-03-05 NOTE — Discharge Instructions (Signed)

## 2020-03-08 ENCOUNTER — Ambulatory Visit: Payer: PRIVATE HEALTH INSURANCE | Admitting: Physician Assistant

## 2020-03-08 ENCOUNTER — Encounter: Payer: Self-pay | Admitting: Physician Assistant

## 2020-03-08 ENCOUNTER — Other Ambulatory Visit: Payer: Self-pay

## 2020-03-08 VITALS — BP 162/93 | HR 58 | Resp 15 | Ht 65.0 in | Wt 312.6 lb

## 2020-03-08 DIAGNOSIS — Z79899 Other long term (current) drug therapy: Secondary | ICD-10-CM | POA: Diagnosis not present

## 2020-03-08 DIAGNOSIS — R700 Elevated erythrocyte sedimentation rate: Secondary | ICD-10-CM | POA: Diagnosis not present

## 2020-03-08 DIAGNOSIS — G2581 Restless legs syndrome: Secondary | ICD-10-CM

## 2020-03-08 DIAGNOSIS — R5383 Other fatigue: Secondary | ICD-10-CM | POA: Diagnosis not present

## 2020-03-08 DIAGNOSIS — Z87891 Personal history of nicotine dependence: Secondary | ICD-10-CM

## 2020-03-08 DIAGNOSIS — F900 Attention-deficit hyperactivity disorder, predominantly inattentive type: Secondary | ICD-10-CM

## 2020-03-08 DIAGNOSIS — E559 Vitamin D deficiency, unspecified: Secondary | ICD-10-CM

## 2020-03-08 DIAGNOSIS — M359 Systemic involvement of connective tissue, unspecified: Secondary | ICD-10-CM

## 2020-03-08 DIAGNOSIS — M5136 Other intervertebral disc degeneration, lumbar region: Secondary | ICD-10-CM

## 2020-03-08 DIAGNOSIS — E0842 Diabetes mellitus due to underlying condition with diabetic polyneuropathy: Secondary | ICD-10-CM

## 2020-03-08 DIAGNOSIS — J4541 Moderate persistent asthma with (acute) exacerbation: Secondary | ICD-10-CM

## 2020-03-08 DIAGNOSIS — K219 Gastro-esophageal reflux disease without esophagitis: Secondary | ICD-10-CM

## 2020-03-08 DIAGNOSIS — I10 Essential (primary) hypertension: Secondary | ICD-10-CM

## 2020-03-08 DIAGNOSIS — F418 Other specified anxiety disorders: Secondary | ICD-10-CM

## 2020-03-08 DIAGNOSIS — M51369 Other intervertebral disc degeneration, lumbar region without mention of lumbar back pain or lower extremity pain: Secondary | ICD-10-CM

## 2020-03-11 LAB — URINALYSIS, ROUTINE W REFLEX MICROSCOPIC
Bilirubin Urine: NEGATIVE
Glucose, UA: NEGATIVE
Hgb urine dipstick: NEGATIVE
Ketones, ur: NEGATIVE
Leukocytes,Ua: NEGATIVE
Nitrite: NEGATIVE
Protein, ur: NEGATIVE
Specific Gravity, Urine: 1.012 (ref 1.001–1.03)
pH: 5.5 (ref 5.0–8.0)

## 2020-03-11 LAB — SEDIMENTATION RATE: Sed Rate: 46 mm/h — ABNORMAL HIGH (ref 0–20)

## 2020-03-11 LAB — C3 AND C4
C3 Complement: 181 mg/dL (ref 83–193)
C4 Complement: 58 mg/dL — ABNORMAL HIGH (ref 15–57)

## 2020-03-11 LAB — ANTI-DNA ANTIBODY, DOUBLE-STRANDED: ds DNA Ab: 1 IU/mL

## 2020-03-11 NOTE — Progress Notes (Signed)
DsDNA negative.  C4 borderline elevated and C3 WNL.  ESR is elevated but trending down.  UA normal.  Labs are not consistent with a flare.

## 2020-03-13 ENCOUNTER — Encounter: Payer: Self-pay | Admitting: Family Medicine

## 2020-03-15 ENCOUNTER — Other Ambulatory Visit: Payer: Self-pay | Admitting: Family Medicine

## 2020-03-15 ENCOUNTER — Other Ambulatory Visit: Payer: Self-pay | Admitting: Physician Assistant

## 2020-03-15 ENCOUNTER — Other Ambulatory Visit: Payer: Self-pay | Admitting: Rheumatology

## 2020-03-15 NOTE — Telephone Encounter (Signed)
Last Visit: 03/08/2020 Next Visit: 08/09/2020  Okay to refill per Dr. Estanislado Pandy

## 2020-04-02 ENCOUNTER — Encounter: Payer: Self-pay | Admitting: Family Medicine

## 2020-04-02 DIAGNOSIS — M5136 Other intervertebral disc degeneration, lumbar region: Secondary | ICD-10-CM

## 2020-04-11 ENCOUNTER — Other Ambulatory Visit: Payer: Self-pay | Admitting: Physician Assistant

## 2020-04-11 ENCOUNTER — Other Ambulatory Visit: Payer: Self-pay | Admitting: Family Medicine

## 2020-04-11 DIAGNOSIS — M359 Systemic involvement of connective tissue, unspecified: Secondary | ICD-10-CM

## 2020-04-11 NOTE — Telephone Encounter (Signed)
Ok to refill 30 day supply of PLQ.  We will not be able to continue to prescribe PLQ if she does not have a baseline eye exam.

## 2020-04-11 NOTE — Telephone Encounter (Signed)
Last Visit: 03/08/2020 Next Visit: 08/09/2020 Labs: 02/21/2020 RDW 15.7, Platelets 442, hepatic function panel WNL, BMP WNL Eye exam: No PLQ eye exam on file.   Current Dose per office note 03/08/2020: Plaquenil 200 mg 1 tablet twice daily  Patient states she just received her insurance and will schedule PLQ eye exam. Patient provided with multiple eye doctor offices and numbers. Patient will call back to advise [place] of her appointment dats.   Okay to refill Plaquenil?

## 2020-04-12 ENCOUNTER — Encounter: Payer: Self-pay | Admitting: Family Medicine

## 2020-04-12 ENCOUNTER — Telehealth: Payer: Self-pay | Admitting: Family Medicine

## 2020-04-12 ENCOUNTER — Telehealth (INDEPENDENT_AMBULATORY_CARE_PROVIDER_SITE_OTHER): Payer: PRIVATE HEALTH INSURANCE | Admitting: Family Medicine

## 2020-04-12 ENCOUNTER — Other Ambulatory Visit: Payer: Self-pay

## 2020-04-12 VITALS — BP 120/82 | HR 64 | Temp 97.5°F | Resp 19 | Ht 65.0 in | Wt 312.0 lb

## 2020-04-12 DIAGNOSIS — J0100 Acute maxillary sinusitis, unspecified: Secondary | ICD-10-CM | POA: Diagnosis not present

## 2020-04-12 DIAGNOSIS — G8929 Other chronic pain: Secondary | ICD-10-CM

## 2020-04-12 DIAGNOSIS — M533 Sacrococcygeal disorders, not elsewhere classified: Secondary | ICD-10-CM

## 2020-04-12 MED ORDER — AZITHROMYCIN 250 MG PO TABS
ORAL_TABLET | ORAL | 0 refills | Status: DC
Start: 2020-04-12 — End: 2020-09-04

## 2020-04-12 MED ORDER — CYCLOBENZAPRINE HCL 10 MG PO TABS
10.0000 mg | ORAL_TABLET | Freq: Three times a day (TID) | ORAL | 0 refills | Status: DC | PRN
Start: 2020-04-12 — End: 2020-07-17

## 2020-04-12 MED ORDER — TRAMADOL HCL 50 MG PO TABS: 50.0000 mg | ORAL_TABLET | Freq: Three times a day (TID) | ORAL | 0 refills | Status: AC | PRN

## 2020-04-12 NOTE — Telephone Encounter (Signed)
Placed a new order. If this is incorrect I need the IMG number they want me to use.

## 2020-04-12 NOTE — Progress Notes (Signed)
Virtual Visit via Video   I connected with patient on 04/12/20 at  9:00 AM EDT by a video enabled telemedicine application and verified that I am speaking with the correct person using two identifiers.  Location patient: Home Location provider: [facility]up[facility], Office Persons participating in the virtual visit: Patient, Provider, [place] (Jess B)  I discussed the limitations of evaluation and management by telemedicine and the availability of in person appointments. The patient expressed understanding and agreed to proceed.  Subjective:   HPI:   Sinus pressure- + HA, pressure behind her eyes.  No fever.  + maxillary facial pain.  + nasal congestion.  Had negative COVID test.  No ear pain.  + cough.  Using Flonase daily.  Feels similar to previous sinus infxns.  No bloody nasal drainage.  sxs started 6 days ago.  sxs have been consistent throughout the week.  Some immunosuppression due to Plaquenil.    SI joint pain- has appt next Friday for steroid injxns.  Is having pain and is asking for medication to hold her over until injxn.  ROS:   See pertinent positives and negatives per HPI.  Patient Active Problem List   Diagnosis Date Noted   Essential hypertension 07/17/2013    Priority: Medium   Hyperlipidemia associated with type 2 diabetes mellitus ([place]) 02/21/2020   Insomnia 02/21/2020   Vitamin D deficiency 02/21/2020   Degenerative disc disease, lumbar 02/21/2020   Diabetic polyneuropathy associated with diabetes mellitus due to underlying condition ([place]) 06/14/2019   Visit for preventive health examination 04/11/2018   Breast cancer screening 04/11/2018   Memory changes 11/23/2017   RLS (restless legs syndrome) 03/17/2017   ADHD (attention deficit hyperactivity disorder), 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   Reactive airway disease 08/21/2016   Pain in right lower leg 04/06/2016   Gastroesophageal reflux disease without esophagitis 12/20/2015   Possible exposure  to STD 06/07/2015   Physical exam 12/14/2014   Urinary frequency 03/30/2014   Breast mass, left 02/16/2014   Edema 02/16/2014   Chest pain 12/29/2013   Hypokalemia 08/28/2013   Depression with anxiety 07/17/2013   Eustachian tube dysfunction 07/17/2013   Severe obesity (BMI >= 40) ([place]) 07/17/2013    Social History   Tobacco Use   Smoking status: Former Smoker    Types: Cigarettes   Smokeless tobacco: Never Used   Tobacco comment: OCC  Substance Use Topics   Alcohol use: Yes    Alcohol/week: 0.0 standard drinks    Comment: socially    Current Outpatient Medications:    acetaminophen (TYLENOL) 325 MG tablet, Take 2 tablets (650 mg total) by mouth every 6 (six) hours as needed., Disp: 90 tablet, Rfl: 3   atenolol (TENORMIN) 50 MG tablet, Take 2 tablets (100 mg total) by mouth at bedtime., Disp: 180 tablet, Rfl: 1   atorvastatin (LIPITOR) 40 MG tablet, , Disp: , Rfl:    Blood Glucose Monitoring Suppl (CONTOUR BLOOD GLUCOSE SYSTEM) w/Device KIT, Use as directed to check sugars 1-2 times daily. Dx E11.9, Disp: 1 kit, Rfl: 2   chlorhexidine (PERIDEX) 0.12 % solution, , Disp: , Rfl:    desonide (DESOWEN) 0.05 % cream, APPLY TO THE AFFECTED AREA(S) TWICE DAILY, Disp: 60 g, Rfl: 3   doxycycline (PERIOSTAT) 20 MG tablet, , Disp: , Rfl:    fluticasone (FLONASE) 50 MCG/ACT nasal spray, PLACE 2 SPRAYS INTO BOTH NOSTRILS DAILY., Disp: 16 g, Rfl: 6   furosemide (LASIX) 20 MG tablet, Take 1 tablet (20 mg total)  daily., Disp: 90 tablet, Rfl: 1 °•  glucose blood (CONTOUR TEST) test strip, Use as instructed to test sugars 1-2 times daily. Dx. E11.9, Disp: 100 each, Rfl: 12 °•  hydrochlorothiazide (HYDRODIURIL) 25 MG tablet, Take 1 tablet (25 mg total) by mouth daily. Please schedule follow up appt with Dr.  for further refills., Disp: 90 tablet, Rfl: 1 °•  hydroxychloroquine (PLAQUENIL) 200 MG tablet, TAKE 1 TABLET (200 MG TOTAL) BY MOUTH 2 (TWO) TIMES DAILY., Disp:  60 tablet, Rfl: 0 °•  losartan (COZAAR) 50 MG tablet, Take 2 tablets (100 mg total) by mouth daily., Disp: 180 tablet, Rfl: 1 °•  metFORMIN (GLUCOPHAGE-XR) 500 MG 24 hr tablet, TAKE 1 TABLET BY MOUTH TWICE DAILY W/ FOOD, Disp: 180 tablet, Rfl: 1 °•  ondansetron (ZOFRAN) 4 MG tablet, TAKE 1 TABLET BY MOUTH EVERY 6 HOURS, Disp: 60 tablet, Rfl: 0 °•  pantoprazole (PROTONIX) 40 MG tablet, TAKE 1 TABLET BY MOUTH DAILY., Disp: 90 tablet, Rfl: 1 °•  potassium chloride SA (K-DUR,KLOR-CON) 20 MEQ tablet, TAKE 2 TABLETS BY MOUTH DAILY, Disp: 60 tablet, Rfl: 6 °•  Safety Lancets 28G MISC, Use as directed to check sugars 1-2 times daily. Dx .E11.9, Disp: 100 each, Rfl: 12 °•  SSS 10-5 10-5 % FOAM, APPLY TO THE AFFECTED AREA ONCE DAILY, Disp: 60 g, Rfl: 3 °•  traZODone (DESYREL) 50 MG tablet, Take 0.5-1 tablets (25-50 mg total) by mouth at bedtime as needed for sleep., Disp: 30 tablet, Rfl: 3 °•  valACYclovir (VALTREX) 1000 MG tablet, TAKE 1 TABLET BY MOUTH TWICE A DAY, Disp: 20 tablet, Rfl: 3 ° °Allergies  °Allergen Reactions  °• Penicillins Other (See Comments)  °  Childhood allergy.  ° ° °Objective:  ° °BP 120/82    Pulse 64    Temp (!) 97.5 °F (36.4 °C) (Skin)    Resp 19    Ht 5' 5" (1.651 m)    Wt (!) 312 lb (141.5 kg)    LMP 12/04/2011    SpO2 100%    BMI 51.92 kg/m²  ° °AAOx3, NAD °NCAT, EOMI °No obvious CN deficits °Coloring WNL °Pt is able to speak clearly, coherently without shortness of breath or increased work of breathing.  °Thought process is linear.  Mood is appropriate.  ° °Assessment and Plan:  ° °Maxillary sinusitis- pt has hx of similar and reports this feels like previous sinus infxns.  She is taking her allergy medication daily.  Given that she is mildly immunosuppressed on Plaquenil, will start abx.  Reviewed supportive care and red flags that should prompt return.  Pt expressed understanding and is in agreement w/ plan.  ° °SI Joint Pain- deteriorated.  Pt has injxn scheduled for next week.  Will hold her  over w/ Tramadol and Flexeril until she is able to have appt.  Pt appreciative. ° ° ° , MD °04/12/2020 ° °

## 2020-04-12 NOTE — Telephone Encounter (Signed)
Doctor Birdie Riddle put in for the patient to have a lumbar facet injection - Patient scheduled for 6/16 - Per notes patient needs another SI Joint injection - last one in May - Patient is wanting to have another SI Joint injection - if you can put the order in - you don't have to call her back

## 2020-04-12 NOTE — Progress Notes (Signed)
I have discussed the procedure for the virtual visit with the patient who has given consent to proceed with assessment and treatment.   Vanessa Reeves, CMA     

## 2020-04-15 ENCOUNTER — Encounter: Payer: Self-pay | Admitting: Rheumatology

## 2020-04-16 ENCOUNTER — Telehealth: Payer: Self-pay | Admitting: *Deleted

## 2020-04-16 NOTE — Telephone Encounter (Signed)
Submitted a Prior Authorization request to [employer] for [facility] via Cover My Meds. Will update once we receive a response.

## 2020-04-19 ENCOUNTER — Other Ambulatory Visit: Payer: PRIVATE HEALTH INSURANCE

## 2020-04-25 ENCOUNTER — Other Ambulatory Visit: Payer: Self-pay | Admitting: *Deleted

## 2020-04-25 DIAGNOSIS — M359 Systemic involvement of connective tissue, unspecified: Secondary | ICD-10-CM

## 2020-04-25 MED ORDER — HYDROXYCHLOROQUINE SULFATE 200 MG PO TABS: 200.0000 mg | ORAL_TABLET | Freq: Two times a day (BID) | ORAL | 0 refills | Status: DC

## 2020-04-25 NOTE — Telephone Encounter (Signed)
Patient is awaiting prior authorization for PLQ. It has been denied and we are appealing the decision. Patient is in need of a refill and for it to be sent to a different pharmacy so she may use a goodrx coupon.   Last Visit: 03/08/2020 Next Visit: 08/09/2020 Labs: 02/21/2020 RDW 15.7, Platelets 442, hepatic function panel WNL, BMP WNL Eye exam: No PLQ eye exam on file.   Patient states she just received her insurance and will schedule PLQ eye exam. Patient provided with multiple eye doctor offices and numbers.   Current Dose per office note 03/08/2020: Plaquenil 200 mg 1 tablet twice daily  Okay to refill PLQ?

## 2020-05-06 MED FILL — HYDROXYCHLOROQUINE 200 MG T: 200 | 30 days supply | Qty: 60 | Fill #0

## 2020-05-06 MED FILL — FUROSEMIDE 20 MG TABS: 20 | 30 days supply | Qty: 30 | Fill #1

## 2020-05-06 MED FILL — ATENOLOL 50 MG TABLET: 50 | 30 days supply | Qty: 60 | Fill #1

## 2020-05-06 MED FILL — metFORMIN HCL ER 500 MG TB2: 500 | 30 days supply | Qty: 60 | Fill #1

## 2020-05-06 MED FILL — HYDROCHLOROTHIAZIDE 25 MG T: 25 | 30 days supply | Qty: 30 | Fill #1

## 2020-05-16 ENCOUNTER — Other Ambulatory Visit: Payer: Self-pay | Admitting: Rheumatology

## 2020-05-16 DIAGNOSIS — M359 Systemic involvement of connective tissue, unspecified: Secondary | ICD-10-CM

## 2020-05-28 ENCOUNTER — Encounter: Payer: Self-pay | Admitting: Family Medicine

## 2020-05-28 LAB — HM DIABETES EYE EXAM

## 2020-05-30 MED FILL — SSS 10-5 FOAM: 10-5 | 30 days supply | Qty: 60 | Fill #1

## 2020-05-31 ENCOUNTER — Ambulatory Visit
Admission: RE | Admit: 2020-05-31 | Discharge: 2020-05-31 | Disposition: A | Discharge status: Home / Self Care | Payer: PRIVATE HEALTH INSURANCE | Source: Ambulatory Visit | Attending: Family Medicine | Admitting: Family Medicine

## 2020-05-31 ENCOUNTER — Other Ambulatory Visit: Payer: Self-pay

## 2020-05-31 DIAGNOSIS — G8929 Other chronic pain: Secondary | ICD-10-CM

## 2020-05-31 MED ORDER — IOPAMIDOL (ISOVUE-M 200) INJECTION 41%
1.0000 mL | Freq: Once | INTRAMUSCULAR | Status: AC
  Administered 2020-05-31: 1 mL via INTRA_ARTICULAR

## 2020-05-31 MED ORDER — METHYLPREDNISOLONE ACETATE 40 MG/ML INJ SUSP (RADIOLOG
120.0000 mg | Freq: Once | INTRAMUSCULAR | Status: AC
  Administered 2020-05-31: 120 mg via INTRA_ARTICULAR

## 2020-05-31 NOTE — Discharge Instructions (Signed)

## 2020-06-03 ENCOUNTER — Encounter: Payer: Self-pay | Admitting: Family Medicine

## 2020-06-03 MED FILL — LOSARTAN POTASSIUM 50 MG TA: 50 | 30 days supply | Qty: 60 | Fill #2

## 2020-06-04 ENCOUNTER — Encounter: Payer: Self-pay | Admitting: Rheumatology

## 2020-06-05 NOTE — Progress Notes (Deleted)
Office Visit Note  Patient: Vanessa Reeves             Date of Birth: [birth year]           MRN: [id number]             PCP: Midge Minium, MD Referring: Midge Minium, MD Visit Date: 06/07/2020 Occupation: _0 @  Subjective:  No chief complaint on file.   History of Present Illness: Vanessa Reeves is a 49 y.o. female ***   Activities of Daily Living:  Patient reports morning stiffness for *** {minute/hour:19697}.   Patient {ACTIONS;DENIES/REPORTS:21021675::"Denies"} nocturnal pain.  Difficulty dressing/grooming: {ACTIONS;DENIES/REPORTS:21021675::"Denies"} Difficulty climbing stairs: {ACTIONS;DENIES/REPORTS:21021675::"Denies"} Difficulty getting out of chair: {ACTIONS;DENIES/REPORTS:21021675::"Denies"} Difficulty using hands for taps, buttons, cutlery, and/or writing: {ACTIONS;DENIES/REPORTS:21021675::"Denies"}  No Rheumatology ROS completed.   PMFS History:  Patient Active Problem List   Diagnosis Date Noted  . Hyperlipidemia associated with type 2 diabetes mellitus ([place]) 02/21/2020  . Insomnia 02/21/2020  . Vitamin D deficiency 02/21/2020  . Degenerative disc disease, lumbar 02/21/2020  . Diabetic polyneuropathy associated with diabetes mellitus due to underlying condition ([place]) 06/14/2019  . Visit for preventive health examination 04/11/2018  . Breast cancer screening 04/11/2018  . Memory changes 11/23/2017  . RLS (restless legs syndrome) 03/17/2017  . 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 Reactive airway disease 08/21/2016  . Pain in right lower leg 04/06/2016  . Gastroesophageal reflux disease without esophagitis 12/20/2015  . Possible exposure to STD 06/07/2015  . Physical exam 12/14/2014  . Urinary frequency 03/30/2014  . Breast mass, left 02/16/2014  . Edema 02/16/2014  . Chest pain 12/29/2013  . Hypokalemia 08/28/2013  . Depression with anxiety 07/17/2013  . Essential hypertension 07/17/2013  .  Eustachian tube dysfunction 07/17/2013  . Severe obesity (BMI >= 40) ([place]) 07/17/2013    Past Medical History:  Diagnosis Date  . Anxiety   . Diabetes mellitus without complication ([place])   . Herpes   . Hypertension   . RLS (restless legs syndrome) 03/17/2017   Right leg    Family History  Problem Relation Age of Onset  . Hypertension Mother   . Stroke Father   . Hypertension Paternal Grandmother   . Diabetes Mellitus II Paternal Grandmother   . Healthy Daughter   . Healthy Son    Past Surgical History:  Procedure Laterality Date  . ABDOMINAL HYSTERECTOMY    . DILATION AND CURETTAGE OF UTERUS    . TUBAL LIGATION     Social History   Social History Narrative   Lives with husband and 2 children in a 2 story home.     Works as a Chartered certified accountant at [employer].     Highest level of education:  High school, in college now   Immunization History  Administered Date(s) Administered  . Influenza,inj,Quad PF,6+ Mos 07/17/2013, 07/27/2014, 07/26/2015, 08/21/2016, 07/06/2017, 07/29/2018, 07/03/2019  . MMR 03/08/2017, 04/05/2017  . PPD Test 07/26/2015, 03/07/2018, 05/23/2018  . Pneumococcal Polysaccharide-23 01/16/2015  . Tdap 10/14/2015     Objective: Vital Signs: LMP 12/04/2011    Physical Exam   Musculoskeletal Exam: ***  CDAI Exam: CDAI Score: -- Patient Global: --; Provider Global: -- Swollen: --; Tender: -- Joint Exam 06/07/2020   No joint exam has been documented for this visit   There is currently no information documented on the homunculus. Go to the Rheumatology activity and complete the homunculus joint exam.  Investigation: No additional findings.  Imaging: DG Fluoro Guide Spinal/SI Jt  Inj Right  Result Date: 05/31/2020 CLINICAL DATA:  Recurrent right sacroiliac pain. Less robust response to the most recent SI joint injection. FLUOROSCOPY TIME:  Fluoroscopy Time: 23 seconds Radiation Exposure Index: 53.64 microGray*m^2 PROCEDURE: RIGHT SI JOINT INJECTION.  After a thorough discussion of risks and benefits of the procedure, including bleeding, infection, injury to nerves, blood vessels, and adjacent structures, verbal and written consent was obtained. The patient was placed prone on the fluoroscopy table and localization was performed over the sacrum. Target site marked using fluoroscopic guidance. The skin was prepped and draped in the usual sterile fashion using Betadine soap. After local anesthesia with 1% lidocaine without epinephrine and subsequent deep anesthesia, a 5 inch 22 gauge spinal needle was advanced into the right SI joint. Injection of 0.5 ml Isovue-M 200 confirmed intra-articular placement. No vascular uptake present. Subsequently, 120 mg of Depo-Medrol and 1 mL of 0.5% bupivacaine were injected into the SI joint. The needle was removed and a sterile dressing applied. No complications were observed. The patient was observed and released under the care of a driver after 20 minutes. IMPRESSION: Successful fluoroscopically guided right SI joint injection. Electronically Signed   By: Logan Bores M.D.   On: 05/31/2020 10:06    Recent Labs: Lab Results  Component Value Date   WBC 10.0 02/21/2020   HGB 12.2 02/21/2020   PLT 442.0 (H) 02/21/2020   NA 137 02/21/2020   K 3.6 02/21/2020   CL 97 02/21/2020   CO2 32 02/21/2020   GLUCOSE 95 02/21/2020   BUN 19 02/21/2020   CREATININE 0.88 02/21/2020   BILITOT 0.3 02/21/2020   ALKPHOS 74 02/21/2020   AST 11 02/21/2020   ALT 10 02/21/2020   PROT 7.8 02/21/2020   ALBUMIN 4.2 02/21/2020   CALCIUM 10.2 02/21/2020   GFRAA >60 08/03/2019   QFTBGOLDPLUS NEGATIVE 07/03/2019    Speciality Comments: No specialty comments available.  Procedures:  No procedures performed Allergies: Penicillins   Assessment / Plan:     Visit Diagnoses: No diagnosis found.  Orders: No orders of the defined types were placed in this encounter.  No orders of the defined types were placed in this  encounter.   Face-to-face time spent with patient was *** minutes. Greater than 50% of time was spent in counseling and coordination of care.  Follow-Up Instructions: No follow-ups on file.   Earnestine Mealing, CMA  Note - This record has been created using Editor, commissioning.  Chart creation errors have been sought, but may not always  have been located. Such creation errors do not reflect on  the standard of medical care.

## 2020-06-07 ENCOUNTER — Ambulatory Visit: Payer: PRIVATE HEALTH INSURANCE | Admitting: Physician Assistant

## 2020-06-07 ENCOUNTER — Encounter: Payer: Self-pay | Admitting: Family Medicine

## 2020-06-10 ENCOUNTER — Encounter: Payer: Self-pay | Admitting: Family Medicine

## 2020-06-10 DIAGNOSIS — Z111 Encounter for screening for respiratory tuberculosis: Secondary | ICD-10-CM

## 2020-06-10 DIAGNOSIS — Z0184 Encounter for antibody response examination: Secondary | ICD-10-CM

## 2020-06-14 ENCOUNTER — Encounter: Payer: PRIVATE HEALTH INSURANCE | Admitting: Family Medicine

## 2020-06-17 MED FILL — ATENOLOL 50 MG TABLET: 50 | 30 days supply | Qty: 60 | Fill #2

## 2020-06-19 ENCOUNTER — Other Ambulatory Visit: Payer: Self-pay | Admitting: Rheumatology

## 2020-06-19 ENCOUNTER — Encounter: Payer: PRIVATE HEALTH INSURANCE | Admitting: Family Medicine

## 2020-06-19 DIAGNOSIS — M359 Systemic involvement of connective tissue, unspecified: Secondary | ICD-10-CM

## 2020-06-19 MED FILL — PANTOPRAZOLE SOD DR 40 MG T: 40 | 30 days supply | Qty: 30 | Fill #2

## 2020-06-19 MED FILL — HYDROXYCHLOROQUINE 200 MG T: 200 | 30 days supply | Qty: 60 | Fill #0

## 2020-06-19 MED FILL — HYDROCHLOROTHIAZIDE 25 MG T: 25 | 30 days supply | Qty: 30 | Fill #2

## 2020-06-19 MED FILL — FUROSEMIDE 20 MG TABS: 20 | 30 days supply | Qty: 30 | Fill #2

## 2020-06-19 MED FILL — metFORMIN HCL ER 500 MG TB2: 500 | 30 days supply | Qty: 60 | Fill #2

## 2020-06-19 MED FILL — DESONIDE 0.05% CREAM: 0.05 | 30 days supply | Qty: 60 | Fill #1

## 2020-06-19 NOTE — Telephone Encounter (Signed)
Last Visit:03/08/2020 Next Visit:08/09/2020 Labs:02/21/2020 RDW 15.7, Platelets 442, hepatic function panel WNL, BMP WNL Eye exam:no baseline eye exam on file. Appointment scheduled for August 09, 2020 at 8:40 am   Current Dose per office note5/05/2020:Plaquenil 200 mg 1 tablet twice daily  Okay to refill PLQ?

## 2020-06-20 ENCOUNTER — Encounter: Payer: PRIVATE HEALTH INSURANCE | Admitting: Family Medicine

## 2020-06-25 ENCOUNTER — Encounter: Payer: Self-pay | Admitting: Family Medicine

## 2020-07-03 ENCOUNTER — Other Ambulatory Visit: Payer: Self-pay | Admitting: Rheumatology

## 2020-07-03 NOTE — Telephone Encounter (Signed)
Please call the patient to clarify if she is still taking zofran as needed prior to refilling.

## 2020-07-03 NOTE — Telephone Encounter (Signed)
Last Visit: 03/08/2020 Next Visit: 08/09/2020  Okay to refill Zofran?

## 2020-07-04 ENCOUNTER — Encounter: Payer: Self-pay | Admitting: Rheumatology

## 2020-07-04 MED FILL — ONDANSETRON HCL 4 MG TABS: 4 | 15 days supply | Qty: 60 | Fill #0

## 2020-07-04 NOTE — Progress Notes (Signed)
Office Visit Note  Patient: Vanessa Reeves             Date of Birth: [birth year]           MRN: [id number]             PCP: Midge Minium, MD Referring: Midge Minium, MD Visit Date: 07/17/2020 Occupation: @GUAROCC @  Subjective:  Low back pain   History of Present Illness: Vanessa Reeves is a 49 y.o. female with history of autoimmune disease.  She is taking plaquenil 200 mg 1 tablet by mouth twice daily.  She has not missed any doses of PLQ recently.  She denies any signs or symptoms of an autoimmune disease flare recently.  She denies any recent rashes, increased photosensitivity, oral or nasal ulcerations, symptoms of Raynaud's, or enlarged lymph nodes. She has chronic sicca symptoms and uses over-the-counter products for symptomatic relief.  She has been under a lot of stress recently since she moved and has switched jobs.  She is also been experiencing increased lower back pain and right-sided radiculopathy.  The radiculopathy has been constant.  She is also been experiencing worsening nocturnal pain.  In the past she has had spinal injections but the last 2 she has not had any relief.  She has tried taking Tylenol but has not noticed much improvement.  She is also tried Flexeril which helps temporarily.  She requested a refill of Flexeril today. She has received both covid-19 vaccinations.     Activities of Daily Living:  Patient reports morning stiffness for 0  minutes.   Patient Reports nocturnal pain.  Difficulty dressing/grooming: Denies Difficulty climbing stairs: Denies Difficulty getting out of chair: Denies Difficulty using hands for taps, buttons, cutlery, and/or writing: Denies  Review of Systems  Constitutional: Positive for fatigue.  HENT: Positive for mouth dryness. Negative for mouth sores and nose dryness.   Eyes: Positive for dryness. Negative for pain and visual disturbance.  Respiratory: Negative for cough, hemoptysis, shortness of breath and  difficulty breathing.   Cardiovascular: Negative for chest pain, palpitations, hypertension and swelling in legs/feet.  Gastrointestinal: Positive for constipation. Negative for blood in stool and diarrhea.  Endocrine: Negative for increased urination.  Genitourinary: Negative for painful urination.  Musculoskeletal: Positive for arthralgias and joint pain. Negative for joint swelling, myalgias, muscle weakness, morning stiffness, muscle tenderness and myalgias.  Skin: Negative for color change, pallor, rash, hair loss, nodules/bumps, skin tightness, ulcers and sensitivity to sunlight.  Allergic/Immunologic: Negative for susceptible to infections.  Neurological: Negative for dizziness, numbness, headaches and weakness.  Hematological: Negative for swollen glands.  Psychiatric/Behavioral: Negative for depressed mood and sleep disturbance. The patient is nervous/anxious.     PMFS History:  Patient Active Problem List   Diagnosis Date Noted  . Hyperlipidemia associated with type 2 diabetes mellitus ([place]) 02/21/2020  . Insomnia 02/21/2020  . Vitamin D deficiency 02/21/2020  . Degenerative disc disease, lumbar 02/21/2020  . Diabetic polyneuropathy associated with diabetes mellitus due to underlying condition ([place]) 06/14/2019  . Visit for preventive health examination 04/11/2018  . Breast cancer screening 04/11/2018  . Memory changes 11/23/2017  . RLS (restless legs syndrome) 03/17/2017  . 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 Reactive airway disease 08/21/2016  . Pain in right lower leg 04/06/2016  . Gastroesophageal reflux disease without esophagitis 12/20/2015  . Possible exposure to STD 06/07/2015  . Physical exam 12/14/2014  . Urinary frequency 03/30/2014  . Breast mass, left 02/16/2014  .  Edema 02/16/2014  . Chest pain 12/29/2013  . Hypokalemia 08/28/2013  . Depression with anxiety 07/17/2013  . Essential hypertension 07/17/2013  .  Eustachian tube dysfunction 07/17/2013  . Severe obesity (BMI >= 40) ([place]) 07/17/2013    Past Medical History:  Diagnosis Date  . Anxiety   . Diabetes mellitus without complication ([place])   . Herpes   . Hypertension   . RLS (restless legs syndrome) 03/17/2017   Right leg    Family History  Problem Relation Age of Onset  . Hypertension Mother   . Stroke Father   . Hypertension Paternal Grandmother   . Diabetes Mellitus II Paternal Grandmother   . Healthy Daughter   . Healthy Son    Past Surgical History:  Procedure Laterality Date  . ABDOMINAL HYSTERECTOMY    . DILATION AND CURETTAGE OF UTERUS    . TUBAL LIGATION     Social History   Social History Narrative   Lives with husband and 2 children in a 2 story home.     Works as a Chartered certified accountant at [employer].     Highest level of education:  High school, in college now   Immunization History  Administered Date(s) Administered  . Influenza,inj,Quad PF,6+ Mos 07/17/2013, 07/27/2014, 07/26/2015, 08/21/2016, 07/06/2017, 07/29/2018, 07/03/2019  . MMR 03/08/2017, 04/05/2017  . Moderna SARS-COVID-2 Vaccination 06/06/2020, 07/04/2020  . PPD Test 07/26/2015, 03/07/2018, 05/23/2018  . Pneumococcal Polysaccharide-23 01/16/2015  . Tdap 10/14/2015     Objective: Vital Signs: BP (!) 145/87 (BP Location: Left Arm, Patient Position: Sitting, Cuff Size: Large)   Pulse 74   Resp 16   Ht $R'5\' 5"'Si$  (1.651 m)   Wt (!) 322 lb 6.4 oz (146.2 kg)   LMP 12/04/2011   BMI 53.65 kg/m    Physical Exam Vitals and nursing note reviewed.  Constitutional:      Appearance: She is well-developed.  HENT:     Head: Normocephalic and atraumatic.  Eyes:     Conjunctiva/sclera: Conjunctivae normal.  Pulmonary:     Effort: Pulmonary effort is normal.  Abdominal:     Palpations: Abdomen is soft.  Musculoskeletal:     Cervical back: Normal range of motion.  Skin:    General: Skin is warm and dry.     Capillary Refill: Capillary refill takes less than 2  seconds.  Neurological:     Mental Status: She is alert and oriented to person, place, and time.  Psychiatric:        Behavior: Behavior normal.      Musculoskeletal Exam: C-spine has good range of motion with no discomfort.  No midline spinal tenderness noted on exam.  She has some tenderness over the right SI joint.  Shoulder joints, elbow joints, wrist joints, MCPs, PIPs, DIPs have good range of motion with no synovitis.  She is able to make a complete fist bilaterally.  Hip joints have good range of motion with no discomfort.  No tenderness over trochanteric bursa bilaterally.  Knee joints have good range of motion with no warmth or effusion.  Ankle joints have good range of motion no tenderness or inflammation.  CDAI Exam: CDAI Score: -- Patient Global: --; Provider Global: -- Swollen: --; Tender: -- Joint Exam 07/17/2020   No joint exam has been documented for this visit   There is currently no information documented on the homunculus. Go to the Rheumatology activity and complete the homunculus joint exam.  Investigation: No additional findings.  Imaging: XR Lumbar Spine 2-3 Views  Result Date: 07/17/2020 No significant disc space narrowing was noted.  Facet joint arthropathy was noted. Impression: These findings are consistent with lumbar facet joint arthropathy.   Recent Labs: Lab Results  Component Value Date   WBC 10.0 02/21/2020   HGB 12.2 02/21/2020   PLT 442.0 (H) 02/21/2020   NA 137 02/21/2020   K 3.6 02/21/2020   CL 97 02/21/2020   CO2 32 02/21/2020   GLUCOSE 95 02/21/2020   BUN 19 02/21/2020   CREATININE 0.88 02/21/2020   BILITOT 0.3 02/21/2020   ALKPHOS 74 02/21/2020   AST 11 02/21/2020   ALT 10 02/21/2020   PROT 7.8 02/21/2020   ALBUMIN 4.2 02/21/2020   CALCIUM 10.2 02/21/2020   GFRAA >60 08/03/2019   QFTBGOLDPLUS NEGATIVE 07/03/2019    Speciality Comments: No specialty comments available.  Procedures:  No procedures performed Allergies:  Penicillins   Assessment / Plan:     Visit Diagnoses: Autoimmune disease ([place]) - ANA 1: 160 nuclear homogenous, anti-Ro positive, ESR 58, Oral ulcers, Raynaud's, photosensitivity, arthralgias, family history of lupus-maternal grandmother: She has not had any signs or symptoms of a autoimmune disease flare.  She is clinically doing well on Plaquenil 200 mg 1 tablet by mouth twice daily.  She is tolerating Plaquenil and has not missed any doses recently.  She has no joint tenderness or synovitis on examination today.  She continues to have persistent fatigue but attributes it to recently moving and switching jobs.  She has not had any recent rashes, increased photosensitivity, symptoms of Raynaud's, oral or nasal ulcerations.  She continues to have chronic sicca symptoms and uses over-the-counter products for symptomatic relief.  Lab work from 03/08/2020 was reviewed with the patient today in the office.  ESR remains elevated but is trending down, double-stranded DNA was negative, C3 was within normal limits, and C4 was mildly elevated.  We will recheck autoimmune lab work today.  She will continue taking Plaquenil as prescribed.  She does not need any refills at this time.  She was advised to notify us if she develops any new or worsening symptoms- Plan: CBC with Differential/Platelet, Urinalysis, Routine w reflex microscopic, COMPLETE METABOLIC PANEL WITH GFR, C3 and C4, Sedimentation rate, Anti-DNA antibody, double-stranded, VITAMIN D 25 Hydroxy (Vit-D Deficiency, Fractures)  High risk medication use - Plaquenil 200 mg 1 tablet by mouth twice daily.  She is due to update her Plaquenil eye exam.  She was advised to schedule this eye exam ASAP.  She was given a Plaquenil eye exam form to take with her to her upcoming appointment.  CBC, BMP, and hepatic function panel were drawn on 02/21/2020.  CBC and CMP orders were released today.  She is aware that she will require updated lab work every 5 months to monitor for  drug toxicity.- Plan: CBC with Differential/Platelet, COMPLETE METABOLIC PANEL WITH GFR She has received both COVID-19 vaccinations.  She was encouraged to continue to wear a mask and social distance.  She plans on receiving the third dose when she is eligible.  She was advised to notify us or her PCP if she develops a COVID-19 infection in order to receive the antibody infusion.  Elevated sed rate -She has a history of elevated sed rate but her sed rate has been trending down.  Her sed rate was 46 on 03/08/2020.  We will check her sed rate today.  She has no joint inflammation on examination today.  Plan: Sedimentation rate  Other fatigue: She has been experiencing  increased fatigue recently.  According to the patient she recently moved and has switched jobs so she has been under increased stress which she attributes some of her fatigue to.  She is also been having erupted sleep at night due to the increased lower back pain.  She was given a prescription for Flexeril 10 mg 1 tablet by mouth at bedtime as needed for muscle spasms which will also help her sleep.  We discussed the importance of good sleep hygiene.  DDD (degenerative disc disease), lumbar - Plan: cyclobenzaprine (FLEXERIL) 10 MG tablet, XR Lumbar Spine 2-3 Views  Chronic midline low back pain with right-sided sciatica -She presents today with acute on chronic lower back pain.  She has been experiencing severe lower back pain and right-sided radiculopathy.  Her symptoms of radiculopathy have been constant.  She has been taking Tylenol but has not noticed much relief.  She has also tried Flexeril as needed which has been helpful.  She has also been experiencing worsening nocturnal pain.  She has not had any bowel or bladder incontinence.  She has not had any recent fevers.  She has been experiencing a sensation of weakness in her right lower extremity recently.  In the past she has had spinal injections at [facility] radiology but the last 2  injections she has not had any relief.  According to the patient they injected her right SI joint.  On examination today she has tenderness over the right SI joint.  No midline spinal tenderness noted.  She has painful range of motion of the lumbar spine on exam.  X-rays of the lumbar spine were obtained today which revealed findings consistent with facet joint arthropathy.  X-rays of the pelvis were reviewed from 09/08/2019.  We discussed that most of her discomfort is likely coming from the lumbar spine.  We will proceed with an MRI of the lumbar spine for further assessment.  A refill of Flexeril was sent to the pharmacy today for symptomatic relief.  Plan: XR Lumbar Spine 2-3 Views  Vitamin D deficiency  Other medical conditions are listed as follows:   Diabetic polyneuropathy associated with diabetes mellitus due to underlying condition ([place])  Gastroesophageal reflux disease without esophagitis  Essential hypertension  Depression with anxiety  Moderate persistent reactive airway disease with acute exacerbation  ADHD (attention deficit hyperactivity disorder), inattentive type  RLS (restless legs syndrome)  Former smoker    Orders: Orders Placed This Encounter  Procedures  . XR Lumbar Spine 2-3 Views  . MR LUMBAR SPINE WO CONTRAST  . CBC with Differential/Platelet  . Urinalysis, Routine w reflex microscopic  . COMPLETE METABOLIC PANEL WITH GFR  . C3 and C4  . Sedimentation rate  . Anti-DNA antibody, double-stranded  . VITAMIN D 25 Hydroxy (Vit-D Deficiency, Fractures)   Meds ordered this encounter  Medications  . cyclobenzaprine (FLEXERIL) 10 MG tablet    Sig: Take 1 tablet (10 mg total) by mouth at bedtime.    Dispense:  30 tablet    Refill:  0      Follow-Up Instructions: Return in about 5 months (around 12/17/2020) for Autoimmune Disease.   Ofilia Neas, PA-C  Note - This record has been created using Dragon software.  Chart creation errors have been sought,  but may not always  have been located. Such creation errors do not reflect on  the standard of medical care.

## 2020-07-04 NOTE — Telephone Encounter (Signed)
Attempted to contact the patient and left message for patient to call the office.  

## 2020-07-04 NOTE — Telephone Encounter (Signed)
Patient states she is till taking the Zofran.

## 2020-07-17 ENCOUNTER — Ambulatory Visit: Payer: Self-pay

## 2020-07-17 ENCOUNTER — Ambulatory Visit: Payer: PRIVATE HEALTH INSURANCE | Admitting: Physician Assistant

## 2020-07-17 ENCOUNTER — Other Ambulatory Visit: Payer: Self-pay

## 2020-07-17 ENCOUNTER — Encounter: Payer: Self-pay | Admitting: Physician Assistant

## 2020-07-17 VITALS — BP 145/87 | HR 74 | Resp 16 | Ht 65.0 in | Wt 322.4 lb

## 2020-07-17 DIAGNOSIS — E559 Vitamin D deficiency, unspecified: Secondary | ICD-10-CM | POA: Diagnosis not present

## 2020-07-17 DIAGNOSIS — R5383 Other fatigue: Secondary | ICD-10-CM

## 2020-07-17 DIAGNOSIS — G8929 Other chronic pain: Secondary | ICD-10-CM | POA: Diagnosis not present

## 2020-07-17 DIAGNOSIS — R700 Elevated erythrocyte sedimentation rate: Secondary | ICD-10-CM

## 2020-07-17 DIAGNOSIS — M359 Systemic involvement of connective tissue, unspecified: Secondary | ICD-10-CM

## 2020-07-17 DIAGNOSIS — Z79899 Other long term (current) drug therapy: Secondary | ICD-10-CM

## 2020-07-17 DIAGNOSIS — F418 Other specified anxiety disorders: Secondary | ICD-10-CM

## 2020-07-17 DIAGNOSIS — F900 Attention-deficit hyperactivity disorder, predominantly inattentive type: Secondary | ICD-10-CM

## 2020-07-17 DIAGNOSIS — J4541 Moderate persistent asthma with (acute) exacerbation: Secondary | ICD-10-CM

## 2020-07-17 DIAGNOSIS — I10 Essential (primary) hypertension: Secondary | ICD-10-CM

## 2020-07-17 DIAGNOSIS — M5441 Lumbago with sciatica, right side: Secondary | ICD-10-CM | POA: Diagnosis not present

## 2020-07-17 DIAGNOSIS — M5136 Other intervertebral disc degeneration, lumbar region: Secondary | ICD-10-CM | POA: Diagnosis not present

## 2020-07-17 DIAGNOSIS — K219 Gastro-esophageal reflux disease without esophagitis: Secondary | ICD-10-CM

## 2020-07-17 DIAGNOSIS — E0842 Diabetes mellitus due to underlying condition with diabetic polyneuropathy: Secondary | ICD-10-CM

## 2020-07-17 DIAGNOSIS — M51369 Other intervertebral disc degeneration, lumbar region without mention of lumbar back pain or lower extremity pain: Secondary | ICD-10-CM

## 2020-07-17 DIAGNOSIS — Z87891 Personal history of nicotine dependence: Secondary | ICD-10-CM

## 2020-07-17 DIAGNOSIS — G2581 Restless legs syndrome: Secondary | ICD-10-CM

## 2020-07-17 MED ORDER — CYCLOBENZAPRINE HCL 10 MG PO TABS: 10.0000 mg | ORAL_TABLET | Freq: Every day | ORAL | 0 refills | Status: DC

## 2020-07-17 MED FILL — CYCLOBENZAPRINE HCL 10 MG T: 10 | 30 days supply | Qty: 30 | Fill #0

## 2020-07-18 ENCOUNTER — Other Ambulatory Visit: Payer: Self-pay | Admitting: Family Medicine

## 2020-07-18 LAB — CBC WITH DIFFERENTIAL/PLATELET
Absolute Monocytes: 662 cells/uL (ref 200–950)
Basophils Absolute: 23 cells/uL (ref 0–200)
Basophils Relative: 0.3 %
Eosinophils Absolute: 246 cells/uL (ref 15–500)
Eosinophils Relative: 3.2 %
HCT: 35.6 % (ref 35.0–45.0)
Hemoglobin: 11.6 g/dL — ABNORMAL LOW (ref 11.7–15.5)
Lymphs Abs: 2672 cells/uL (ref 850–3900)
MCH: 28.4 pg (ref 27.0–33.0)
MCHC: 32.6 g/dL (ref 32.0–36.0)
MCV: 87 fL (ref 80.0–100.0)
MPV: 9 fL (ref 7.5–12.5)
Monocytes Relative: 8.6 %
Neutro Abs: 4096 cells/uL (ref 1500–7800)
Neutrophils Relative %: 53.2 %
Platelets: 385 10*3/uL (ref 140–400)
RBC: 4.09 10*6/uL (ref 3.80–5.10)
RDW: 15.3 % — ABNORMAL HIGH (ref 11.0–15.0)
Total Lymphocyte: 34.7 %
WBC: 7.7 10*3/uL (ref 3.8–10.8)

## 2020-07-18 LAB — URINALYSIS, ROUTINE W REFLEX MICROSCOPIC
Bilirubin Urine: NEGATIVE
Glucose, UA: NEGATIVE
Hgb urine dipstick: NEGATIVE
Ketones, ur: NEGATIVE
Leukocytes,Ua: NEGATIVE
Nitrite: NEGATIVE
Protein, ur: NEGATIVE
Specific Gravity, Urine: 1.016 (ref 1.001–1.03)
pH: 5.5 (ref 5.0–8.0)

## 2020-07-18 LAB — COMPLETE METABOLIC PANEL WITH GFR
AG Ratio: 1.2 (calc) (ref 1.0–2.5)
ALT: 10 U/L (ref 6–29)
AST: 14 U/L (ref 10–35)
Albumin: 4 g/dL (ref 3.6–5.1)
Alkaline phosphatase (APISO): 72 U/L (ref 31–125)
BUN: 15 mg/dL (ref 7–25)
CO2: 32 mmol/L (ref 20–32)
Calcium: 9.4 mg/dL (ref 8.6–10.2)
Chloride: 101 mmol/L (ref 98–110)
Creat: 0.97 mg/dL (ref 0.50–1.10)
GFR, Est African American: 81 mL/min/{1.73_m2} (ref >=60)
GFR, Est Non African American: 70 mL/min/{1.73_m2} (ref >=60)
Globulin: 3.4 g/dL (calc) (ref 1.9–3.7)
Glucose, Bld: 77 mg/dL (ref 65–99)
Potassium: 4 mmol/L (ref 3.5–5.3)
Sodium: 140 mmol/L (ref 135–146)
Total Bilirubin: 0.3 mg/dL (ref 0.2–1.2)
Total Protein: 7.4 g/dL (ref 6.1–8.1)

## 2020-07-18 LAB — ANTI-DNA ANTIBODY, DOUBLE-STRANDED: ds DNA Ab: <1 IU/mL

## 2020-07-18 LAB — VITAMIN D 25 HYDROXY (VIT D DEFICIENCY, FRACTURES): Vit D, 25-Hydroxy: 34 ng/mL (ref 30–100)

## 2020-07-18 LAB — C3 AND C4
C3 Complement: 175 mg/dL (ref 83–193)
C4 Complement: 60 mg/dL — ABNORMAL HIGH (ref 15–57)

## 2020-07-18 LAB — SEDIMENTATION RATE: Sed Rate: 48 mm/h — ABNORMAL HIGH (ref 0–20)

## 2020-07-18 MED FILL — LOSARTAN POTASSIUM 50 MG TA: 50 | 30 days supply | Qty: 60 | Fill #3

## 2020-07-18 MED FILL — POTASSIUM CHLORIDE CRYS ER: 20 | 60 days supply | Qty: 120 | Fill #0

## 2020-07-18 NOTE — Progress Notes (Signed)
Hgb is borderline low.  RBC count and Hct are WNL. CMP WNL. UA normal.  ESR is mildly elevated but stable. C3 WNL.C4 borderline elevated.  DsDNA is pending.   Vitamin D is WNL.  Please advise the patient to take a maintenance dose of vitamin D 2,000 units daily.

## 2020-07-18 NOTE — Progress Notes (Signed)
DsDNA is negative.

## 2020-07-22 NOTE — Telephone Encounter (Signed)
Her vitamin D was 34 on 07/17/20. She can take vitamin D 2,000 units daily.    Ok to take women's multivitamin with iron.  Hemoglobin is borderline low.  Hct WNL.    She can try taking metamucil OTC to help with constipation.

## 2020-07-27 ENCOUNTER — Encounter: Payer: Self-pay | Admitting: Rheumatology

## 2020-07-29 ENCOUNTER — Encounter: Payer: Self-pay | Admitting: Rheumatology

## 2020-07-29 DIAGNOSIS — M5136 Other intervertebral disc degeneration, lumbar region: Secondary | ICD-10-CM

## 2020-07-29 DIAGNOSIS — M47819 Spondylosis without myelopathy or radiculopathy, site unspecified: Secondary | ICD-10-CM

## 2020-07-29 NOTE — Telephone Encounter (Signed)
She will likely benefit from facet joint injections.  She had facet joint arthropathy on x-rays.

## 2020-07-29 NOTE — Telephone Encounter (Signed)
Please advise the patient to follow up with Dr. Birdie Riddle to discuss going back to [facility] for an injection. She has had SI joint and facet joint injections in the past at [facility].  We are unable to place orders/refer her there due to conflict of interest.

## 2020-07-29 NOTE — Telephone Encounter (Signed)
She advise the patient to avoid taking any NSAIDs.

## 2020-07-30 ENCOUNTER — Ambulatory Visit (HOSPITAL_COMMUNITY): Payer: 59

## 2020-07-30 ENCOUNTER — Encounter (HOSPITAL_COMMUNITY): Payer: Self-pay

## 2020-07-30 MED FILL — ATENOLOL 50 MG TABLET: 50 | 30 days supply | Qty: 60 | Fill #3

## 2020-07-30 NOTE — Telephone Encounter (Signed)
Please advise patient that with her ongoing back problems it would be better for her to be evaluated by back specialist.  We can refer her to Dr. Louanne Skye.

## 2020-08-01 MED FILL — SSS 10-5 FOAM: 10-5 | 30 days supply | Qty: 60 | Fill #2

## 2020-08-09 ENCOUNTER — Ambulatory Visit: Payer: PRIVATE HEALTH INSURANCE | Admitting: Physician Assistant

## 2020-08-26 DIAGNOSIS — Z79899 Other long term (current) drug therapy: Secondary | ICD-10-CM | POA: Diagnosis not present

## 2020-08-26 LAB — HM DIABETES EYE EXAM

## 2020-08-26 MED FILL — FUROSEMIDE 20 MG TABS: 20 | 30 days supply | Qty: 30 | Fill #3

## 2020-08-26 MED FILL — traZODone HCL 50 MG TABS: 50 | 30 days supply | Qty: 30 | Fill #1

## 2020-08-26 MED FILL — LOSARTAN POTASSIUM 50 MG TA: 50 | 30 days supply | Qty: 60 | Fill #4

## 2020-09-04 ENCOUNTER — Ambulatory Visit (INDEPENDENT_AMBULATORY_CARE_PROVIDER_SITE_OTHER): Payer: 59 | Admitting: Family Medicine

## 2020-09-04 ENCOUNTER — Other Ambulatory Visit: Payer: Self-pay | Admitting: Family Medicine

## 2020-09-04 ENCOUNTER — Encounter: Payer: Self-pay | Admitting: Family Medicine

## 2020-09-04 ENCOUNTER — Other Ambulatory Visit (HOSPITAL_COMMUNITY)
Admission: RE | Admit: 2020-09-04 | Discharge: 2020-09-04 | Disposition: A | Discharge status: Home / Self Care | Payer: 59 | Source: Ambulatory Visit | Attending: Family Medicine | Admitting: Family Medicine

## 2020-09-04 ENCOUNTER — Other Ambulatory Visit: Payer: Self-pay

## 2020-09-04 VITALS — BP 140/84 | HR 77 | Temp 97.8°F | Resp 20 | Ht 65.0 in | Wt 319.6 lb

## 2020-09-04 DIAGNOSIS — H9191 Unspecified hearing loss, right ear: Secondary | ICD-10-CM | POA: Diagnosis not present

## 2020-09-04 DIAGNOSIS — Z1211 Encounter for screening for malignant neoplasm of colon: Secondary | ICD-10-CM | POA: Diagnosis not present

## 2020-09-04 DIAGNOSIS — Z0000 Encounter for general adult medical examination without abnormal findings: Secondary | ICD-10-CM | POA: Diagnosis not present

## 2020-09-04 DIAGNOSIS — N898 Other specified noninflammatory disorders of vagina: Secondary | ICD-10-CM | POA: Diagnosis not present

## 2020-09-04 DIAGNOSIS — E1142 Type 2 diabetes mellitus with diabetic polyneuropathy: Secondary | ICD-10-CM

## 2020-09-04 DIAGNOSIS — E559 Vitamin D deficiency, unspecified: Secondary | ICD-10-CM

## 2020-09-04 DIAGNOSIS — E0842 Diabetes mellitus due to underlying condition with diabetic polyneuropathy: Secondary | ICD-10-CM | POA: Diagnosis not present

## 2020-09-04 DIAGNOSIS — Z1231 Encounter for screening mammogram for malignant neoplasm of breast: Secondary | ICD-10-CM | POA: Diagnosis not present

## 2020-09-04 DIAGNOSIS — R413 Other amnesia: Secondary | ICD-10-CM | POA: Diagnosis not present

## 2020-09-04 DIAGNOSIS — Z1159 Encounter for screening for other viral diseases: Secondary | ICD-10-CM

## 2020-09-04 LAB — HEPATIC FUNCTION PANEL
ALT: 11 U/L (ref 0–35)
AST: 12 U/L (ref 0–37)
Albumin: 3.8 g/dL (ref 3.5–5.2)
Alkaline Phosphatase: 74 U/L (ref 39–117)
Bilirubin, Direct: 0.1 mg/dL (ref 0.0–0.3)
Total Bilirubin: 0.3 mg/dL (ref 0.2–1.2)
Total Protein: 7.4 g/dL (ref 6.0–8.3)

## 2020-09-04 LAB — CBC WITH DIFFERENTIAL/PLATELET
Basophils Absolute: 0.1 10*3/uL (ref 0.0–0.1)
Basophils Relative: 0.9 % (ref 0.0–3.0)
Eosinophils Absolute: 0.2 10*3/uL (ref 0.0–0.7)
Eosinophils Relative: 3.1 % (ref 0.0–5.0)
HCT: 36.2 % (ref 36.0–46.0)
Hemoglobin: 11.9 g/dL — ABNORMAL LOW (ref 12.0–15.0)
Lymphocytes Relative: 37.4 % (ref 12.0–46.0)
Lymphs Abs: 2.7 10*3/uL (ref 0.7–4.0)
MCHC: 32.7 g/dL (ref 30.0–36.0)
MCV: 85 fl (ref 78.0–100.0)
Monocytes Absolute: 0.5 10*3/uL (ref 0.1–1.0)
Monocytes Relative: 7 % (ref 3.0–12.0)
Neutro Abs: 3.7 10*3/uL (ref 1.4–7.7)
Neutrophils Relative %: 51.6 % (ref 43.0–77.0)
Platelets: 387 10*3/uL (ref 150.0–400.0)
RBC: 4.26 Mil/uL (ref 3.87–5.11)
RDW: 15.9 % — ABNORMAL HIGH (ref 11.5–15.5)
WBC: 7.1 10*3/uL (ref 4.0–10.5)

## 2020-09-04 LAB — VITAMIN D 25 HYDROXY (VIT D DEFICIENCY, FRACTURES): VITD: 19.22 ng/mL — ABNORMAL LOW (ref 30.00–100.00)

## 2020-09-04 LAB — BASIC METABOLIC PANEL
BUN: 11 mg/dL (ref 6–23)
CO2: 33 mEq/L — ABNORMAL HIGH (ref 19–32)
Calcium: 9.5 mg/dL (ref 8.4–10.5)
Chloride: 102 mEq/L (ref 96–112)
Creatinine, Ser: 0.83 mg/dL (ref 0.40–1.20)
GFR: 83.69 mL/min (ref >60.00)
Glucose, Bld: 121 mg/dL — ABNORMAL HIGH (ref 70–99)
Potassium: 3.9 mEq/L (ref 3.5–5.1)
Sodium: 141 mEq/L (ref 135–145)

## 2020-09-04 LAB — LIPID PANEL
Cholesterol: 144 mg/dL (ref 0–200)
HDL: 57.9 mg/dL (ref >39.00)
LDL Cholesterol: 75 mg/dL (ref 0–99)
NonHDL: 86.57
Total CHOL/HDL Ratio: 2
Triglycerides: 60 mg/dL (ref 0.0–149.0)
VLDL: 12 mg/dL (ref 0.0–40.0)

## 2020-09-04 LAB — HEMOGLOBIN A1C: Hgb A1c MFr Bld: 6.5 % (ref 4.6–6.5)

## 2020-09-04 LAB — TSH: TSH: 2.18 u[IU]/mL (ref 0.35–4.50)

## 2020-09-04 MED ORDER — FLUTICASONE PROPIONATE 50 MCG/ACT NA SUSP: 2.0000 | Freq: Every day | NASAL | 6 refills | Status: DC

## 2020-09-04 MED FILL — FLUTICASONE PROP 50 MCG SPR: 50 | 30 days supply | Qty: 16 | Fill #0

## 2020-09-04 NOTE — Progress Notes (Signed)
Subjective:    Patient ID: Vanessa Reeves, female    DOB: 1970/12/18, 49 y.o.   MRN: [id number]  HPI CPE- UTD on eye exam, foot exam.  Due to start colon cancer screening.  No need for pap due to hysterectomy.  Due for mammo.  Reviewed past medical, surgical, family and social histories.   Patient Care Team    Relationship Specialty Notifications Start End  Midge Minium, MD PCP - General Family Medicine  07/17/13    Comment: Awanda Mink, [place] Physician Optometry  11/26/15   Dene Gentry, MD Consulting Physician Sports Medicine  11/26/15   Alda Berthold, DO Consulting Physician Neurology  11/26/15     Health Maintenance  Topic Date Due  . Hepatitis C Screening  Never done  . HEMOGLOBIN A1C  08/22/2020  . MAMMOGRAM  09/04/2021 (Originally 08/12/2019)  . FOOT EXAM  02/20/2021  . OPHTHALMOLOGY EXAM  05/28/2021  . TETANUS/TDAP  10/13/2025  . INFLUENZA VACCINE  Completed  . PNEUMOCOCCAL POLYSACCHARIDE VACCINE AGE 41-64 HIGH RISK  Completed  . COVID-19 Vaccine  Completed  . HIV Screening  Completed      Review of Systems Patient reports no vision changes, adenopathy,fever, weight change,  persistant/recurrent hoarseness , swallowing issues, chest pain, palpitations, edema, persistant/recurrent cough, hemoptysis, dyspnea (rest/exertional/paroxysmal nocturnal), gastrointestinal bleeding (melena, rectal bleeding), abdominal pain, significant heartburn, bowel changes, GU symptoms (dysuria, hematuria, incontinence),  syncope, focal weakness, numbness & tingling, skin/hair/nail changes, abnormal bruising or bleeding, anxiety, or depression.   + memory loss- 'i'm making serious mistakes'  sxs initially started w/ misplacing things but have now progressed to making mistakes at work.  + hearing loss R ear  + vaginal odor  This visit occurred during the SARS-CoV-2 public health emergency.  Safety protocols were in place, including screening questions prior  to the visit, additional usage of staff PPE, and extensive cleaning of exam room while observing appropriate contact time as indicated for disinfecting solutions.       Objective:   Physical Exam General Appearance:    Alert, cooperative, no distress, appears stated age, obese  Head:    Normocephalic, without obvious abnormality, atraumatic  Eyes:    PERRL, conjunctiva/corneas clear, EOM's intact, fundi    benign, both eyes  Ears:    Normal TM's and external ear canals, both ears  Nose:   Deferred due to COVID  Throat:   Neck:   Supple, symmetrical, trachea midline, no adenopathy;    Thyroid: no enlargement/tenderness/nodules  Back:     Symmetric, no curvature, ROM normal, no CVA tenderness  Lungs:     Clear to auscultation bilaterally, respirations unlabored  Chest Wall:    No tenderness or deformity   Heart:    Regular rate and rhythm, S1 and S2 normal, no murmur, rub   or gallop  Breast Exam:    Deferred to mammo  Abdomen:     Soft, non-tender, bowel sounds active all four quadrants,    no masses, no organomegaly  Genitalia:    Deferred  Rectal:    Extremities:   Extremities normal, atraumatic, no cyanosis or edema  Pulses:   2+ and symmetric all extremities  Skin:   Skin color, texture, turgor normal, no rashes or lesions  Lymph nodes:   Cervical, supraclavicular, and axillary nodes normal  Neurologic:   CNII-XII intact, normal strength, sensation and reflexes    throughout          Assessment &  Plan:  Vaginal odor- pt has hx of BV.  Feels this may have returned.  Check urine cytology prior to treatment.  Pt expressed understanding and is in agreement w/ plan.

## 2020-09-04 NOTE — Assessment & Plan Note (Signed)
Chronic problem.  UTD on foot exam, eye exam.  On ARB for renal protection.  Check labs.  Adjust meds prn  

## 2020-09-04 NOTE — Patient Instructions (Signed)
Follow up in 3-4 months to recheck diabetes We'll notify you of your lab results and make any changes if needed We'll call you with your Neuropsych appt (for memory loss), Audiology appt (for hearing), GI appt (for colon cancer screening) They should call you to schedule your mammogram Continue to work on healthy diet and regular exercise- you can do it! Add Omega 3 fatty acid supplement, a B complex supplement, and Vit E to help w/ memory Call with any questions or concerns Stay Safe!  Stay Healthy!

## 2020-09-04 NOTE — Assessment & Plan Note (Signed)
Deteriorated.  Pt reports she has difficulty hearing out of R ear and has to turn up phone, television, music very loud- to the point that family is complaining.  Refer to audiology for complete evaluation

## 2020-09-04 NOTE — Assessment & Plan Note (Signed)
Pt has hx of this.  Check labs and replete prn. 

## 2020-09-04 NOTE — Assessment & Plan Note (Signed)
Ongoing issue for pt.  She reports this is worsening.  Rather than paying for expensive memory supplement, I encouraged her to start Omega 3 fatty acids, B complex vitamin, and Vit E.  Will refer for Neuropsych testing for complete evaluation.  Pt expressed understanding and is in agreement w/ plan.

## 2020-09-04 NOTE — Assessment & Plan Note (Signed)
Pt's PE WNL w/ exception of obesity.  Due for colonoscopy- referral placed.  Due for mammo- ordered.  UTD on flu, Tdap, COVID.  Check labs.  Anticipatory guidance provided.

## 2020-09-05 ENCOUNTER — Telehealth: Payer: Self-pay | Admitting: Family Medicine

## 2020-09-05 LAB — HEPATITIS C ANTIBODY
Hepatitis C Ab: NONREACTIVE
SIGNAL TO CUT-OFF: 0.02 (ref <1.00)

## 2020-09-05 NOTE — Telephone Encounter (Signed)
Can you please change the order to [facility] the  Breast center

## 2020-09-05 NOTE — Addendum Note (Signed)
Addended byHildred Alamin on: 09/05/2020 12:03 PM   Modules accepted: Orders

## 2020-09-05 NOTE — Telephone Encounter (Signed)
Mammogram order has been changed to [facility]

## 2020-09-06 ENCOUNTER — Encounter: Payer: Self-pay | Admitting: Counselor

## 2020-09-06 ENCOUNTER — Encounter: Payer: Self-pay | Admitting: Family Medicine

## 2020-09-06 LAB — URINE CYTOLOGY ANCILLARY ONLY
Bacterial Vaginitis-Urine: NEGATIVE
Candida Urine: NEGATIVE
Comment: NEGATIVE
Trichomonas: NEGATIVE

## 2020-09-09 ENCOUNTER — Other Ambulatory Visit: Payer: Self-pay

## 2020-09-09 DIAGNOSIS — E559 Vitamin D deficiency, unspecified: Secondary | ICD-10-CM

## 2020-09-09 MED ORDER — VITAMIN D (ERGOCALCIFEROL) 1.25 MG (50000 UNIT) PO CAPS: 50000.0000 [IU] | ORAL_CAPSULE | ORAL | 0 refills | Status: DC

## 2020-09-09 MED FILL — VIT D2 1.25 MG (50,000 UNIT: 1.25 MG | 84 days supply | Qty: 12 | Fill #0

## 2020-09-09 NOTE — Progress Notes (Signed)
er

## 2020-09-11 ENCOUNTER — Encounter: Payer: Self-pay | Admitting: Specialist

## 2020-09-11 ENCOUNTER — Ambulatory Visit: Payer: 59 | Admitting: Specialist

## 2020-09-11 ENCOUNTER — Telehealth: Payer: Self-pay

## 2020-09-11 ENCOUNTER — Ambulatory Visit (INDEPENDENT_AMBULATORY_CARE_PROVIDER_SITE_OTHER): Payer: 59

## 2020-09-11 VITALS — BP 144/83 | HR 87 | Ht 65.0 in | Wt 319.7 lb

## 2020-09-11 DIAGNOSIS — G9519 Other vascular myelopathies: Secondary | ICD-10-CM | POA: Diagnosis not present

## 2020-09-11 DIAGNOSIS — M5136 Other intervertebral disc degeneration, lumbar region: Secondary | ICD-10-CM

## 2020-09-11 DIAGNOSIS — Z6841 Body Mass Index (BMI) 40.0 and over, adult: Secondary | ICD-10-CM

## 2020-09-11 DIAGNOSIS — M4316 Spondylolisthesis, lumbar region: Secondary | ICD-10-CM

## 2020-09-11 MED ORDER — GABAPENTIN 100 MG PO CAPS
100.0000 mg | ORAL_CAPSULE | Freq: Every day | ORAL | 3 refills | Status: DC
Start: 2020-09-11 — End: 2020-11-27

## 2020-09-11 MED FILL — GABAPENTIN 100 MG CAPSULE: 100 | 30 days supply | Qty: 30 | Fill #0

## 2020-09-11 NOTE — Progress Notes (Addendum)
Office Visit Note   Patient: Vanessa Reeves           Date of Birth: September 22, 1971           MRN: [id number] Visit Date: 09/11/2020              Requested by: Bo Merino, MD [address] [place],  [place] [address] PCP: Midge Minium, MD   Assessment & Plan: Visit Diagnoses:  1. Degenerative disc disease, lumbar   2. Neurogenic claudication (HCC)   3. Spondylolisthesis, lumbar region   4. Body mass index 50.0-59.9, adult (HCC)     Plan: Avoid bending, stooping and avoid lifting weights greater than 10 lbs. Avoid prolong standing and walking. Avoid frequent bending and stooping  No lifting greater than 10 lbs. May use ice or moist heat for pain. Weight loss is of benefit. Handicap license is approved. Myelogram and post myelogram CT Scan of the lumbar spine to assess for a Dynamic spondylolisthesis and spinal stenosis  Worse with standing and walking.  Gabapentin100mg  po qhs for neurogenic pain. Advised to call her primary care MD and inquire about referral to nutrition specialist for weight loss as her Size and BMI greater than 50 makes her a poor candidate for surgical solution, she may need to consider a  Bariatric procedure to decrease her BMI to less than 40. Follow-Up Instructions: Return in about 4 weeks (around 10/09/2020).   Orders:  Orders Placed This Encounter  Procedures  . XR Lumb Spine Flex&Ext Only  . DG Myelogram Lumbar  . CT LUMBAR SPINE W CONTRAST   Meds ordered this encounter  Medications  . gabapentin (NEURONTIN) 100 MG capsule    Sig: Take 1 capsule (100 mg total) by mouth at bedtime.    Dispense:  30 capsule    Refill:  3      Procedures: No procedures performed   Clinical Data: No additional findings.   Subjective: Chief Complaint  Patient presents with  . Lower Back - Pain  . Right Leg - Pain    49 year old female, 2-3 year history of right buttock pain right radiation into the right posterior and  lateral thigh and calf and into the right foot laterally and plantar lateral right foot. Has seen  Dr. Estanislado Pandy and underwent right SI injections with relief at first but the more recent SI injections are not relieving the pain. She has pain with bending and stooping and with standing and walking. No bowel or bladder difficulty. She is unable to walk a mile and when walking in a grocery store she tends to lean on the grocery cart to be able to walk the distance. 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 Is concerned about her job with [employer] on [address] and the occasional need to restrain patients down on the floor.     Review of Systems  Constitutional: Negative.   HENT: Negative.   Eyes: Negative.   Respiratory: Negative.   Cardiovascular: Negative.   Gastrointestinal: Negative.   Endocrine: Negative.   Genitourinary: Negative.   Musculoskeletal: Negative.   Skin: Negative.   Allergic/Immunologic: Negative.   Neurological: Negative.   Hematological: Negative.   Psychiatric/Behavioral: Negative.      Objective: Vital Signs: BP (!) 144/83 (BP Location: Left Arm, Patient Position: Sitting)   Pulse 87   Ht 5\' 5"  (1.651 m)   Wt (!) 319 lb 11.2 oz (145 kg)   LMP 12/04/2011   BMI 53.20  kg/m   Physical Exam Constitutional:      Appearance: She is well-developed.  HENT:     Head: Normocephalic and atraumatic.  Eyes:     Pupils: Pupils are equal, round, and reactive to light.  Pulmonary:     Effort: Pulmonary effort is normal.     Breath sounds: Normal breath sounds.  Abdominal:     General: Bowel sounds are normal.     Palpations: Abdomen is soft.  Musculoskeletal:     Cervical back: Normal range of motion and neck supple.     Lumbar back: Negative right straight leg raise test and negative left straight leg raise test.  Skin:    General: Skin is warm and dry.  Neurological:     Mental Status: She is alert and oriented to person, place, and time.  Psychiatric:         Behavior: Behavior normal.        Thought Content: Thought content normal.        Judgment: Judgment normal.     Back Exam   Tenderness  The patient is experiencing tenderness in the lumbar.  Range of Motion  Extension: normal  Flexion: normal  Lateral bend right: normal  Lateral bend left: normal  Rotation right: normal  Rotation left: normal   Muscle Strength  Right Quadriceps:  5/5  Left Quadriceps:  5/5  Right Hamstrings:  5/5  Left Hamstrings:  5/5   Tests  Straight leg raise right: negative Straight leg raise left: negative  Reflexes  Patellar: 2/4 Achilles: 1/4 Babinski's sign: normal   Other  Toe walk: normal Sensation: normal Gait: normal  Erythema: no back redness Scars: absent  Comments:  Motor without focal deficit.       Specialty Comments:  No specialty comments available.  Imaging: XR Lumb Spine Flex&Ext Only  Result Date: 09/11/2020 AP and lateral flexion and extension radiographs of the lumbar spine demonstrate grade 1 anterolisthesis at L4-5 of 2-3 mm moderate degenerative disc disease L5-S1 with disc narrowing much more than seen in 2018, Also facet fluid on MRI from 2018 suggests that spondylolisthesis may have been Present but not demonstrated on supine MRI.     PMFS History: Patient Active Problem List   Diagnosis Date Noted  . Hearing loss of right ear 09/04/2020  . Hyperlipidemia associated with type 2 diabetes mellitus ([place]) 02/21/2020  . Insomnia 02/21/2020  . Vitamin D deficiency 02/21/2020  . Degenerative disc disease, lumbar 02/21/2020  . Diabetic polyneuropathy associated with diabetes mellitus due to underlying condition ([place]) 06/14/2019  . Visit for preventive health examination 04/11/2018  . Breast cancer screening 04/11/2018  . Memory changes 11/23/2017  . RLS (restless legs syndrome) 03/17/2017  . 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 Reactive airway disease  08/21/2016  . Gastroesophageal reflux disease without esophagitis 12/20/2015  . Possible exposure to STD 06/07/2015  . Physical exam 12/14/2014  . Breast mass, left 02/16/2014  . Depression with anxiety 07/17/2013  . Essential hypertension 07/17/2013  . Eustachian tube dysfunction 07/17/2013  . Severe obesity (BMI >= 40) ([place]) 07/17/2013   Past Medical History:  Diagnosis Date  . Anxiety   . Diabetes mellitus without complication ([place])   . Herpes   . Hypertension   . RLS (restless legs syndrome) 03/17/2017   Right leg    Family History  Problem Relation Age of Onset  . Hypertension Mother   . Stroke Father   . Hypertension Paternal Grandmother   .  Diabetes Mellitus II Paternal Grandmother   . Healthy Daughter   . Healthy Son     Past Surgical History:  Procedure Laterality Date  . ABDOMINAL HYSTERECTOMY    . DILATION AND CURETTAGE OF UTERUS    . TUBAL LIGATION     Social History   Occupational History  . Not on file  Tobacco Use  . Smoking status: Former Smoker    Types: Cigarettes  . Smokeless tobacco: Never Used  . Tobacco comment: OCC  Vaping Use  . Vaping Use: Never used  Substance and Sexual Activity  . Alcohol use: Yes    Alcohol/week: 0.0 standard drinks    Comment: socially  . Drug use: No  . Sexual activity: Yes    Birth control/protection: Surgical

## 2020-09-11 NOTE — Patient Instructions (Addendum)
Avoid bending, stooping and avoid lifting weights greater than 10 lbs. Avoid prolong standing and walking. Avoid frequent bending and stooping  No lifting greater than 10 lbs. May use ice or moist heat for pain. Weight loss is of benefit. Myelogram and post myelogram CT Scan of the lumbar spine to assess for a Dynamic spondylolisthesis and spinal stenosis  Worse with standing and walking.  Gabapentin100mg  po qhs for neurogenic pain.

## 2020-09-12 ENCOUNTER — Encounter: Payer: Self-pay | Admitting: Specialist

## 2020-09-13 NOTE — Telephone Encounter (Signed)
Phone call to patient to verify medication list and allergies for myelogram procedure. Pt instructed to hold Trazodone for 48hrs prior to myelogram appointment time and 24 hours after appointment. Pt also instructed to have a driver the day of the procedure, the procedure would take around 2 hours, and discharge instructions discussed. Pt verbalized understanding.

## 2020-09-17 MED FILL — ATENOLOL 50 MG TABLET: 50 | 90 days supply | Qty: 180 | Fill #1

## 2020-09-20 ENCOUNTER — Ambulatory Visit: Payer: 59 | Admitting: Audiology

## 2020-09-24 ENCOUNTER — Other Ambulatory Visit: Payer: 59

## 2020-10-04 ENCOUNTER — Other Ambulatory Visit: Payer: Self-pay

## 2020-10-04 ENCOUNTER — Ambulatory Visit: Payer: 59 | Attending: Family Medicine | Admitting: Audiology

## 2020-10-04 DIAGNOSIS — H9193 Unspecified hearing loss, bilateral: Secondary | ICD-10-CM | POA: Insufficient documentation

## 2020-10-04 NOTE — Patient Instructions (Signed)
Outpatient Audiology and [place], [place]  [address] [phone] __________________________________________________________________________________________________________  Tips for Talking to Hard of Hearing Persons   1. Face the hard of hearing person directly.  2. Lighting should be directed on the speakers face.  3. Avoid talking from another room.  4. Be aware that anyone will have more difficulty concentrating when fatigued or ill.  5. Speak naturally. It is more important to speak more slowly rather than more loudly.  6. Keep your hands away from your mouth while talking.  7. If you are eating, chewing, smoking, smiling, while talking, your speech will be more difficult to understand.  8. If a person has difficulty understanding some particular phrase or word, try to find a different way of saying the same thing; rephrase, rather than repeat the original words.  9. Avoid using sentences that go on too long. Slow down, and wait to make sure that you have been understood before continuing.  10. If you are giving specific information, such as time or place, ask the hard of hearing person to repeat what you said.  11. Avoid sudden changes of topic.  12. Dont drop your voice at the end of sentences.     Outpatient Audiology and [place], [place]  [address] [phone] __________________________________________________________________________________________________________  Suggestions for Effective Communication   A. Pay Attention to Your Environment 1. Try to keep background noise to a minimum. 2. Position yourself within six to ten feet and where you can see the speakers face. If you have a better-hearing ear, try to keep it towards the speaker. 3. Let the speaker know you have a hearing loss and that you will understand the conversation better if he or she is facing you. 4.  Ask the person to speak clearly and distinctly. Overly loud or exaggerated speech is not easier to understand. 5. Remind your family and friends to be sure that they have your attention before speaking to you. Ask them to be in the same room with you and not to shout across a long distance.   B. Be Alert and Develop Good Listening Habits 1. Use your hearing aid. Sometimes you may not notice how much it helps, but others will. 2. Follow along with the speaker and as you become familiar with the rhythm of his or her speech, you will pick up key words that will allow you to make good guesses about parts of the conversation you have missed. Use the following suggestions if you are still not understanding: a) Restate what you heard to eliminate misunderstandings. b) When you did not hear enough to use a key word and the person has repeated the message, ask him or her to say the same thing again using different words. c) If you dont understand a word or a name, even after repetition, ask the speaker to spell it or write it down. d) In group situations, position yourself so that you can see everyone and are not too far away. 3. Be Realistic! You might not always be sure of what was said; but, by using some of these suggestions you should do better. No one, even those with normal hearing, hears everything all the time.   C. Tips for Difficult Listening Situations 1. Radio or television: These are difficult because you cant always see the faces of the people talking and there may be music accompanying the action. Reduce competing noises as much as possible and consider using assistive  devices or closed captioning. 2. Telephone: Most hearing aids have a feature that helps with the telephone or you can purchase a telephone amplifier. Some people use both. Your audiologist will help you decide which arrangement is best for you. 3. Public places (such as house of worship, theaters, etc.): For  single-speaker situations, try to sit about six rows from the front and in line with the speakers face. At entertainment venues, try to sit close enough to the screen or stage to optimize visual cues. Avoid sitting near noisy children and try to avoid sitting under a balcony. Assistive listening devices should be available.    Outpatient Audiology and [place] [facility], [place] [address]  [place] [phone] [address], [address] [place], [place] [address] Appointments: [phone] 0.3 mile away from [facility]  [facility]  [place], [place] [address] Appointments: [phone] 1 mile away from [facility] and [place] [facility] [place] at [facility] and [employer] [place]. [place], [place] [address] Appointments: [phone] 4 miles away from [facility]  [facility] [address] [place], [place] [address] Appointments: [phone] 7 miles away from [facility] Outpatient Audiology and [facility] [address] [place], [place] [address] Appointments: [phone] 11 miles away from [facility] Outpatient Audiology and [place], [facility] [place]. [place], [place] [address] Appointments: [phone] 34 miles away from [facility] Audiology and [place] [address] [place], [place] [address] Appointments: [phone] 22 miles away from [place] [facility]

## 2020-10-04 NOTE — Procedures (Signed)
  Outpatient Audiology and [place], [place]  [address] [phone]  AUDIOLOGICAL  EVALUATION  NAME: Vanessa Reeves     DOB:   1970/12/05      MRN: [id number]                                                                                     DATE: 10/04/2020     REFERENT: Midge Minium, MD STATUS: Outpatient DIAGNOSIS: Decreased hearing   History: [name] was seen for an audiological evaluation due to decreased hearing occurring over the past year and worsening over the past 2 months. She denies aural fullness, tinnitus, otalgia, and dizziness. [name] reports increased difficulty hearing friends and family. She also reports increased difficulty hearing the television and hearing her phone.   Evaluation:   Otoscopy showed a clear view of the tympanic membranes, bilaterally  Tympanometry results were consistent with normal middle ear function, bilaterally.   Audiometric testing was completed using Conventional Audiometry techniques with insert earphones and TDH headphones. Test results are consistent with normal hearing sensitivity at [phone] Hz, bilaterally. Speech Recognition Thresholds were obtained at 10 dB HL in the right ear and at 10 dB HL in the left ear. Word Recognition Testing was completed at 60 dB HL and [name] scored 100%, bilaterally.  Results:  Today's test results are consistent with normal hearing sensitivity at [phone] Hz, bilaterally. [name] was given handouts on effective communication strategies. The use of assistive listening devices such as FM systems and the [employer], Tax inspector, and [place] were discussed with [name]. It was recommended if [name] continues to have listening difficulty in adverse listening situations to schedule and appointment with an Audiologist to discuss assistive technology options. The test results and recommendations were reviewed with [facility].    Recommendations: 1. Monitor Hearing Sensitivity 2. Use of Effective Communication Strategies 3. Communication Needs Assessment with an Audiologist to further discuss assistive listening devices such as a [place] ([address] [place], [place])      Bari Mantis Audiologist, Au.D., CCC-A 10/04/2020  4:09 PM  Cc: Midge Minium, MD

## 2020-10-04 NOTE — Discharge Instructions (Signed)
Myelogram Discharge Instructions  1. Go home and rest quietly for the next 24 hours.  It is important to lie flat for the next 24 hours.  Get up only to go to the restroom.  You may lie in the bed or on a couch on your back, your stomach, your left side or your right side.  You may have one pillow under your head.  You may have pillows between your knees while you are on your side or under your knees while you are on your back.  2. DO NOT drive today.  Recline the seat as far back as it will go, while still wearing your seat belt, on the way home.  3. You may get up to go to the bathroom as needed.  You may sit up for 10 minutes to eat.  You may resume your normal diet and medications unless otherwise indicated.  Drink lots of extra fluids today and tomorrow.  4. The incidence of headache, nausea, or vomiting is about 5% (one in 20 patients).  If you develop a headache, lie flat and drink plenty of fluids until the headache goes away.  Caffeinated beverages may be helpful.  If you develop severe nausea and vomiting or a headache that does not go away with flat bed rest, call [phone].  5. You may resume normal activities after your 24 hours of bed rest is over; however, do not exert yourself strongly or do any heavy lifting tomorrow. If when you get up you have a headache when standing, go back to bed and force fluids for another 24 hours.  6. Call your physician for a follow-up appointment.  The results of your myelogram will be sent directly to your physician by the following day.  7. If you have any questions or if complications develop after you arrive home, please call [phone].  Discharge instructions have been explained to the patient.  The patient, or the person responsible for the patient, fully understands these instructions  YOU MAY RESUME YOUR TRAZODONE TOMORROW 10/08/20 AT 930 AM

## 2020-10-07 ENCOUNTER — Other Ambulatory Visit: Payer: Self-pay | Admitting: Physician Assistant

## 2020-10-07 ENCOUNTER — Ambulatory Visit
Admission: RE | Admit: 2020-10-07 | Discharge: 2020-10-07 | Disposition: A | Discharge status: Home / Self Care | Payer: 59 | Source: Ambulatory Visit | Attending: Specialist | Admitting: Specialist

## 2020-10-07 ENCOUNTER — Other Ambulatory Visit: Payer: Self-pay

## 2020-10-07 DIAGNOSIS — G9519 Other vascular myelopathies: Secondary | ICD-10-CM

## 2020-10-07 DIAGNOSIS — M359 Systemic involvement of connective tissue, unspecified: Secondary | ICD-10-CM

## 2020-10-07 DIAGNOSIS — M4316 Spondylolisthesis, lumbar region: Secondary | ICD-10-CM

## 2020-10-07 DIAGNOSIS — M5136 Other intervertebral disc degeneration, lumbar region: Secondary | ICD-10-CM | POA: Diagnosis not present

## 2020-10-07 DIAGNOSIS — M5126 Other intervertebral disc displacement, lumbar region: Secondary | ICD-10-CM | POA: Diagnosis not present

## 2020-10-07 DIAGNOSIS — M48062 Spinal stenosis, lumbar region with neurogenic claudication: Secondary | ICD-10-CM | POA: Diagnosis not present

## 2020-10-07 DIAGNOSIS — M48061 Spinal stenosis, lumbar region without neurogenic claudication: Secondary | ICD-10-CM | POA: Diagnosis not present

## 2020-10-07 MED ORDER — ONDANSETRON HCL 4 MG/2ML IJ SOLN
4.0000 mg | Freq: Once | INTRAMUSCULAR | Status: AC
  Administered 2020-10-07: 4 mg via INTRAMUSCULAR

## 2020-10-07 MED ORDER — DIAZEPAM 5 MG PO TABS
10.0000 mg | ORAL_TABLET | Freq: Once | ORAL | Status: AC
  Administered 2020-10-07: 10 mg via ORAL

## 2020-10-07 MED ORDER — MEPERIDINE HCL 50 MG/ML IJ SOLN
50.0000 mg | Freq: Once | INTRAMUSCULAR | Status: AC
  Administered 2020-10-07: 75 mg via INTRAMUSCULAR

## 2020-10-07 MED ORDER — IOPAMIDOL (ISOVUE-M 200) INJECTION 41%
18.0000 mL | Freq: Once | INTRAMUSCULAR | Status: AC
  Administered 2020-10-07: 18 mL via INTRATHECAL

## 2020-10-07 MED FILL — SSS 10-5 FOAM: 10-5 | 30 days supply | Qty: 60 | Fill #3

## 2020-10-07 MED FILL — HYDROXYCHLOROQUINE 200 MG T: 200 | 90 days supply | Qty: 180 | Fill #0

## 2020-10-07 MED FILL — FUROSEMIDE 20 MG TABS: 20 | 30 days supply | Qty: 30 | Fill #4

## 2020-10-07 MED FILL — HYDROCHLOROTHIAZIDE 25 MG T: 25 | 30 days supply | Qty: 30 | Fill #3

## 2020-10-07 MED FILL — metFORMIN HCL ER 500 MG TB2: 500 | 30 days supply | Qty: 60 | Fill #3

## 2020-10-07 MED FILL — DESONIDE 0.05% CREAM: 0.05 | 30 days supply | Qty: 60 | Fill #2

## 2020-10-07 NOTE — Telephone Encounter (Signed)
Last Visit: 07/17/2020 Next Visit: 12/16/2020 Labs: 09/04/2020 Hgb 11.9, RDW 15.9, CO2 33, Glucose 121  Eye exam: 08/26/2020 WNL   Current Dose per office note 07/17/2020: Plaquenil 200 mg 1 tablet by mouth twice daily DX: Autoimmune disease   Okay to refill Plaquenil?

## 2020-10-07 NOTE — Progress Notes (Signed)
Pt reports she has been off of her Trazodone for at least 48 hours.

## 2020-10-08 ENCOUNTER — Encounter: Payer: Self-pay | Admitting: Specialist

## 2020-10-17 ENCOUNTER — Encounter: Payer: Self-pay | Admitting: Counselor

## 2020-10-17 ENCOUNTER — Other Ambulatory Visit: Payer: Self-pay

## 2020-10-17 ENCOUNTER — Ambulatory Visit: Payer: 59

## 2020-10-17 ENCOUNTER — Ambulatory Visit (INDEPENDENT_AMBULATORY_CARE_PROVIDER_SITE_OTHER): Payer: 59 | Admitting: Counselor

## 2020-10-17 DIAGNOSIS — R5383 Other fatigue: Secondary | ICD-10-CM | POA: Diagnosis not present

## 2020-10-17 DIAGNOSIS — G478 Other sleep disorders: Secondary | ICD-10-CM

## 2020-10-17 DIAGNOSIS — F09 Unspecified mental disorder due to known physiological condition: Secondary | ICD-10-CM | POA: Diagnosis not present

## 2020-10-17 NOTE — Progress Notes (Signed)
[facility]  Patient Name: Vanessa Reeves MRN: [id number] Date of Birth: [birth year] Age: 49 y.o. Education: 13 years  Referral Circumstances and Background Information  Vanessa Reeves is a 49 y.o., right-hand dominant, married woman with a history of memory and thinking problems. She was referred by Dr. Bennye Alm for evaluation. There is sparse documentation in the chart regarding her memory problems. She also has a history of autoimmune disease possibly Sjogrens, DMII, HTN, right SI joint pain due to indeterminate etiology and Insomnia.   On interview, the patient's husband stated that he first started noticing her symptoms in August, 2020 when she was "more forgetful than usual." She was diagnosed with Sjogren's shortly thereafter and has been worsening somewhat since then. She notices that she misplaces things frequently, such as her glasses or hey keys. She used to be the one to take care of all the business in the house and now, she isn't doing as well. She has a hard time recalling if she took her medication or not, which is a problem because then she doesn't want to take it again for concern that she may overtake it. She has missed some turns when driving. She also has difficulties formulating her thoughts verbally. She is still working as a Scientist, product/process development and reported that she is able to do things at her job adequately. She left her wallet in the shopping cart at the store about 2 months ago. She can't remember passwords like she used to. They anchored these difficulties to going through nursing school, she got out of school, which "took a lot out of her" according to her husband. By that, he means that there was a lot of stress and it sounds like it really "took a toll" on her, in terms of losing sleep etc. She became symptomatic of her autoimmune condition right around the time that she graduated, she had syncopal episodes on several occasions, and  started feeling "hot", she has joint pain, mouth and eye dryness, and arthralgias. She also reported that she has brain fog when having a flair. With respect to mood, Vanessa Reeves denied any feelings of sadness, although it does sound like she is under a fair amount of stress. The patient has significant sleep problems, which have been going on since she was in school. Her husband stated that she doesn't sleep unless she takes a sleeping aid. She estimated that she gets 4 or 5 hours of sleep. Her energy is up and down, it sounds like sometimes she is fatigued but other times she will stay up. She stated that she has gained a bit of weight since school, with stress eating and not having time to prepare healthy food. She will repeat herself and forget conversations they have had, at times.   With respect to functioning, the patient is still doing very well, although she is less efficient and has minor errors. As above, she forgets things that she needs and is disorganized. She and her husband comanage finances, she gets the utility bills. They also have a lot of responsibilities in terms of their family, financially and otherwise. She has some stress with her mother, who is demented, they are considering placing her in a nursing facility. She estimated that she is working 72 hours in 6 days sometimes, about 60 hours in a week on average. She is able to do things around the house like cooking and the like when she has time. They have two adult  children at home. She is a good driver, as per the patient's husband, although she does drive past turns occasionally. The patient was described as the foundation of the family, and her husband admits that they put a lot of additional stress and responsibility on her at times, and she will have to put a limit on them.   Past Medical History and Review of Relevant Studies   Patient Active Problem List   Diagnosis Date Noted  . Hearing loss of right ear 09/04/2020  .  Hyperlipidemia associated with type 2 diabetes mellitus ([place]) 02/21/2020  . Insomnia 02/21/2020  . Vitamin D deficiency 02/21/2020  . Degenerative disc disease, lumbar 02/21/2020  . Diabetic polyneuropathy associated with diabetes mellitus due to underlying condition ([place]) 06/14/2019  . Visit for preventive health examination 04/11/2018  . Breast cancer screening 04/11/2018  . Memory changes 11/23/2017  . RLS (restless legs syndrome) 03/17/2017  . 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 Reactive airway disease 08/21/2016  . Gastroesophageal reflux disease without esophagitis 12/20/2015  . Possible exposure to STD 06/07/2015  . Physical exam 12/14/2014  . Breast mass, left 02/16/2014  . Depression with anxiety 07/17/2013  . Essential hypertension 07/17/2013  . Eustachian tube dysfunction 07/17/2013  . Severe obesity (BMI >= 40) ([place]) 07/17/2013   Review of Neuroimaging and Relevant Medical History: The patient denied any history of strokes, seizures, major head injuries, or neurological surgery.   Reviewed notes from consultations with rheumatology who has reviewed extensive laboratory testing and noted chronic Sicca symptoms but no definitive diagnosis. She is being evaluated by Orthopedics related to right sided SI joint pain/radicular pain, who ordered a myelogram that she has yet to follow up on.   Current Outpatient Medications  Medication Sig Dispense Refill  . acetaminophen (TYLENOL) 325 MG tablet Take 2 tablets (650 mg total) by mouth every 6 (six) hours as needed. 90 tablet 3  . atenolol (TENORMIN) 50 MG tablet Take 2 tablets (100 mg total) by mouth at bedtime. 180 tablet 1  . atorvastatin (LIPITOR) 40 MG tablet     . Blood Glucose Monitoring Suppl (CONTOUR BLOOD GLUCOSE SYSTEM) w/Device KIT Use as directed to check sugars 1-2 times daily. Dx E11.9 1 kit 2  . cyclobenzaprine (FLEXERIL) 10 MG tablet Take 1 tablet (10 mg total) by mouth at  bedtime. 30 tablet 0  . desonide (DESOWEN) 0.05 % cream APPLY TO THE AFFECTED AREA(S) TWICE DAILY 60 g 3  . fluticasone (FLONASE) 50 MCG/ACT nasal spray Place 2 sprays into both nostrils daily. 16 g 6  . furosemide (LASIX) 20 MG tablet Take 1 tablet (20 mg total) by mouth daily. 90 tablet 1  . gabapentin (NEURONTIN) 100 MG capsule Take 1 capsule (100 mg total) by mouth at bedtime. 30 capsule 3  . glucose blood (CONTOUR TEST) test strip Use as instructed to test sugars 1-2 times daily. Dx. E11.9 100 each 12  . hydrochlorothiazide (HYDRODIURIL) 25 MG tablet Take 1 tablet (25 mg total) by mouth daily. Please schedule follow up appt with Dr. Birdie Riddle for further refills. 90 tablet 1  . hydroxychloroquine (PLAQUENIL) 200 MG tablet TAKE 1 TABLET BY MOUTH TWICE DAILY 180 tablet 0  . losartan (COZAAR) 50 MG tablet Take 2 tablets (100 mg total) by mouth daily. 180 tablet 1  . metFORMIN (GLUCOPHAGE-XR) 500 MG 24 hr tablet TAKE 1 TABLET BY MOUTH TWICE DAILY W/ FOOD 180 tablet 1  . omeprazole (PRILOSEC) 20 MG capsule  (Patient not  taking: Reported on 09/13/2020)    . ondansetron (ZOFRAN) 4 MG tablet TAKE 1 TABLET BY MOUTH EVERY 6 HOURS 60 tablet 0  . pantoprazole (PROTONIX) 40 MG tablet TAKE 1 TABLET BY MOUTH DAILY. 90 tablet 1  . potassium chloride SA (KLOR-CON) 20 MEQ tablet TAKE 2 TABLETS BY MOUTH DAILY 120 tablet 6  . Safety Lancets 28G MISC Use as directed to check sugars 1-2 times daily. Dx .E11.9 100 each 12  . SSS 10-5 10-5 % FOAM APPLY TO THE AFFECTED AREA ONCE DAILY 60 g 3  . traZODone (DESYREL) 50 MG tablet Take 0.5-1 tablets (25-50 mg total) by mouth at bedtime as needed for sleep. 30 tablet 3  . Vitamin D, Ergocalciferol, (DRISDOL) 1.25 MG (50000 UNIT) CAPS capsule Take 1 capsule (50,000 Units total) by mouth every 7 (seven) days. 12 capsule 0   No current facility-administered medications for this visit.   Family History  Problem Relation Age of Onset  . Hypertension Mother   . Stroke Father    . Hypertension Paternal Grandmother   . Diabetes Mellitus II Paternal Grandmother   . Healthy Daughter   . Healthy Son    There is a family history of dementia. Her mother developed the condition in her 11s. She is the only child and neither of her parents had it. Her father died when she was young apparently. Her paternal grandmother developed dementia, sounds like Alzheimer's, in her 41s. She has three older brothers in their 84s, none of whom have cognitive problems. There is no  family history of psychiatric illness.  Psychosocial History  Developmental, Educational and Employment History: The patient is a native of [place], she moved to [place] in 2009 because it was better for her family. She reported that she did very well in school and was in honors classes, she was never held back and had no learning problems. 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 She has worked in long term care, emergency, and psych. She completed her nursing degree at [employer] and described the training as "intense." She works night shift, which makes it difficult for her to have a consistent sleep schedule.   Psychiatric History: The patient reported that she 'went through depression and anxiety' during school, she was taking xanax and an antidepressant, although those were weaned and she has been off them for over a year. She has no significant other history of mental health treatment.   Substance Use History: The patient drinks alcohol sparingly, she is a nonsmoker, she does not use illicit drugs.   Relationship History and Living Cimcumstances: The patient and her husband have been together for nearly 30 years, they were married in 1999. They have two children living with them and one in [place]. They have three grandchildren.   Mental Status and Behavioral Observations  Sensorium/Arousal: The patient's level of arousal was awake and  alert. Hearing and vision were adequate for testing purposes. Orientation: The patient was fully oriented to person, place, time, and date.  Appearance: Dressed in scrubs (had just gotten off work).  Behavior: The patient was pleasant, appropriate, appeared quite tired and commented she was tired having just finished a long shift at work with no sleep. She did appear to be putting for good effort but may have lost attention at times due to fatigue.  Speech/language: Normal in rate, rhythm, volume, and prosody. No word finding  pauses or paraphasic errors noted.  Gait/Posture: Normal on ambulation within the clinic Movement: No overt signs/symptoms of movement disorder Social Comportment: The patient was pleasant and appropriate.  Mood: "I have a good life" Affect: Mainly neutral to euthymic Thought process/content: Thought process was logical, linear, and goal oriented. She had no thought disorder. Thought content was appropriate to the topics discussed.  Safety: No thoughts of harming self or others noted on direct questioning.  Insight: Vanessa Reeves Cognitive Assessment  10/17/2020  Visuospatial/ Executive (0/5) 4  Naming (0/3) 3  Attention: Read list of digits (0/2) 2  Attention: Read list of letters (0/1) 0  Attention: Serial 7 subtraction starting at 100 (0/3) 3  Language: Repeat phrase (0/2) 1  Language : Fluency (0/1) 1  Abstraction (0/2) 1  Delayed Recall (0/5) 2  Orientation (0/6) 6  Total 23  Adjusted Score (based on education) 23   Test Procedures  Wide Range Achievement Test - 4   Word Reading Doy Mince' Intellectual Screening Test Wechsler Adult Intelligence Scale - IV  Digit Span  Arithmetic  Symbol Search  Coding Repeatable Battery for the Assessment of Neuropsychological Status (Form A) ACS Word Choice The Dot Counting Test Controlled Oral Word Association (F-A-S) Semantic Fluency (Animals) Trail Making Test A & B Wisconsin Card Sorting Test - 64 Patient  Health Questionnaire - 9  GAD-7s  Plan  Vanessa Reeves was seen for a psychiatric diagnostic evaluation and neuropsychological testing. 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 She has also been having some rheumatologic issues since then, and started a very demanding schedule, often working 60+ hours a week. She is not sleeping well and only gets 4 or 5 hours most nights. Her husband reported that she will not sleep unless she takes nyquil, although her providers have recommended that she not do that. Her day-to-day symptoms involve mainly executive control type problems. Full and complete note with impressions, recommendations, and interpretation of test data to follow.   Vanessa Simas Nicole Kindred, PsyD, [place] Clinical Neuropsychologist  Informed Consent and Coding/Compliance  Risks and benefits of the evaluation were discussed with the patient prior to all testing procedures. I conducted a clinical interview and neuropsychological testing (at least two tests) with Vanessa Reeves and Vanessa Reeves, B.S. (Technician) administered additional test procedures. The patient was able to tolerate the testing procedures and the patient (and/or family if applicable) is likely to benefit from further follow up to receive the diagnosis and treatment recommendations, which will be rendered at the next encounter. Billing below reflects technician time, my direct face-to-face time with the patient, time spent in test administration, and time spent in professional activities including but not limited to: neuropsychological test interpretation, integration of neuropsychological test data with clinical history, report preparation, treatment planning, care coordination, and review of diagnostically pertinent medical history or studies.   Services associated with this encounter: Clinical Interview  [phone]) plus 60 minutes (66063; Neuropsychological Evaluation by Professional)  135 minutes (01601; Neuropsychological Evaluation by Professional, Adl.) 16 minutes (09323; Test Administration by Professional) 30 minutes (55732; Neuropsychological Testing by Technician) 75 minutes (20254; Neuropsychological Testing by Technician, Adl.)

## 2020-10-17 NOTE — Progress Notes (Signed)
   Psychometrist Note   Cognitive testing was administered to Vanessa Reeves by  , B.S. (Technician) under the supervision of Peter Stewart, Psy.D., ABN. Vanessa Reeves was able to tolerate all test procedures. Dr. Stewart met with the patient as needed to manage any emotional reactions to the testing procedures. Rest breaks were offered.    The battery of tests administered was selected by Dr. Stewart with consideration to the patient's current level of functioning, the nature of her symptoms, emotional and behavioral responses during the interview, level of literacy, observed level of motivation/effort, and the nature of the referral question. This battery was communicated to the psychometrist. Communication between Dr. Stewart and the psychometrist was ongoing throughout the evaluation and Dr. Stewart was immediately accessible at all times. Dr. Stewart provided supervision to the technician on the date of this service, to the extent necessary to assure the quality of all services provided.    Vanessa Reeves will return in approximately one week for an interactive feedback session with Dr. Stewart, at which time test performance, clinical impressions, and treatment recommendations will be reviewed in detail. The patient understands she can contact our office should she require our assistance before this time.   A total of 105 minutes of billable time were spent with Vanessa Reeves by the technician, including test administration and scoring time. Billing for these services is reflected in Dr. Stewart's note.   This note reflects time spent with the psychometrician and does not include test scores, clinical history, or any interpretations made by Dr. Stewart. The full report will follow in a separate note. 

## 2020-10-18 NOTE — Progress Notes (Signed)
[facility]  Patient Name: Vanessa Reeves MRN: [id number] Date of Birth: 08-15-1971 Age: 49 y.o. Education: 13 years  Measurement properties of test scores: IQ, Index, and Standard Scores (SS): Mean = 100; Standard Deviation = 15 Scaled Scores (Ss): Mean = 10; Standard Deviation = 3 Z scores (Z): Mean = 0; Standard Deviation = 1 T scores (T); Mean = 50; Standard Deviation = 10  TEST SCORES:    Note: This summary of test scores accompanies the interpretive report and should not be interpreted by unqualified individuals or in isolation without reference to the report. Test scores are relative to age, gender, and educational history as available and appropriate.   Performance Validity        ACS: Raw Descriptor      Word Choice: 43 Marginal      The Dot Counting Test: Raw Descriptor      E-Score 10 Within Expectation      Embedded Measures: Raw Descriptor      RBANS Effort Index: 2 Within Expectation      WAIS-IV Reliable Digit Span 8 Within Expectation      WAIS-IV Reliable Digit Span Revised 11 Within Expectation      Expected Functioning        Wide Range Achievement Test (Word Reading): Standard/Scaled Score Percentile       Word Reading 84 14      Reynolds Intellectual Screening Test Standard/T-score Percentile      Guess What 37 9      Odd Item Out 42 21  RIST Index 85 16      Cognitive Testing        RBANS, Form : Standard/Scaled Score Percentile  Total Score 72 3  Immediate Memory 53 <1      List Learning 3 1      Story Memory 2 <1  Visuospatial/Constructional 92 30      Figure Copy   (19) 11 63      Judgment of Line Orientation   (13) --- 10-16  Language 95 37      Picture Naming --- 17-25      Semantic Fluency 8 25  Attention 85 16      Digit Span 5 5      Coding 10 50  Delayed Memory 64 1      List Recall   (2) --- <2      List Recognition   (18) --- 3-9      Story Recall   (3) 2 <1      Figure Recall    (10) 6 9      Wechsler Adult Intelligence Scale - IV: Standard/Scaled Score Percentile  Working Memory Index 80 9      Digit Span 6 9          Digit Span Forward 9 37          Digit Span Backward 7 16          Digit Span Sequencing 6 9      Arithmetic 7 16  Processing Speed Index 97 42      Symbol Search 10 50      Coding 9 37      Neuropsychological Assessment Battery (Language Module): T-score Percentile      Naming   (28) 34 5      Verbal Fluency: T-score Percentile      Controlled Oral Word Association (F-A-S) 40 16  Semantic Fluency (Animals) 33 5      Trail Making Test: T-Score Percentile      Part A 53 62      Part B 45 31      Modified Wisconsin Card Sorting Test (MWCST): Standard/T-Score Percentile      Number of Categories Correct 28 2      Number of Perseverative Errors 36 8      Number of Total Errors 29 2      Percent Perseverative Errors 49 46  Executive Function Composite 70 2      Boston Diagnostic Aphasia Exam: Raw Score Scaled Score      Complex Ideational Material 9 5      Clock Drawing Raw Score Descriptor      Command 9 WNL      Rating Scales         Raw Score Descriptor  Patient Health Questionnaire - 9 16 Moderately-Severe  GAD-7 5 Mild   Zeniyah Peaster V. Nicole Kindred PsyD, [place] Clinical Neuropsychologist

## 2020-10-21 NOTE — Progress Notes (Signed)
[facility]  Patient Name: Vanessa Reeves MRN: [id number] Date of Birth: 07-Jan-1971 Age: 49 y.o. Education: 13 years  Clinical Impressions  Vanessa Reeves is a 49 y.o., right-hand dominant, married woman with a history of autoimmune disease, DMII, right SI joint pain due to indeterminate etiology, and memory and thinking problems. 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 This also coincided with an increase in Vanessa responsibilities at work and at present, she stated she works as many as 60-70 hours some weeks. She is only getting 4 or 5 hours of sleep and has a disrupted sleep schedule because she works third shift. She is still functioning adequately at Vanessa job, in day-to-day activities, and Vanessa Reeves admits that she is "the rock" of the family and they "put a lot on Vanessa."   Neuropsychological test findings show evidence of modest premorbid verbal ability with low average word reading and a low average (almost unusually low) score on the verbally mediated subtest of the RIST. Vanessa overall cognitive functioning is perhaps mildly below expectation as compared to that standard, although she is showing scores that are significantly below expectations on measures of memory, select attention measures, and on some measures of executive abilities. Visuospatial and constructional functioning and processing speed presented as areas of relative strength. She scored in the moderately severe range with respect to depressive symptoms, endorsing mainly somatic symptoms but also diminished interest and pleasure in doing things more than half the days over the past two weeks. She scored in the mild range for anxiety symptoms.   Vanessa Reeves is thus demonstrating some level of cognitive difficulties with performance below expectations for Vanessa on  measures of memory, select executive tests, and also on some indicators of language function. Vanessa timeline and presentation lead me to conclude that Vanessa difficulties are most likely due to reversible causes, most notably insufficient sleep. There may also be a component of affective distress, which seems better characterized as adjustment related than meeting criteria for a frank depressive disorder. Distraction related to pain and other chronic health issues such as Vanessa autoimmune condition may also be contributing. MRI of the brain could be considered for full and complete workup given that she has never had neuroimaging and does have a number of low test scores but given Vanessa age and nonfocal symptoms, she is at a very low risk for a vascular or neurodegenerative cause.   Diagnostic Impressions: Other symptoms and signs involving cognitive functions and awareness Adjustment disorder with depressed mood Poor sleep pattern  Recommendations to be discussed with patient  Your performance and presentation on neuropsychological assessment were consistent with difficulties in several areas, most notably on measures of memory, select measures of executive function (e.g., problem solving under uncertainty, cognitive flexibility, etc.), and also on some language tasks. My sense is that these are not representative of an enduring decline and are more related to reversible causes, including insufficient sleep, adjustment related difficulties, and perhaps also distraction related to pain/your autoimmune condition. This means that treating these underlying risk factors may help to get you back on track.   First, I think it is important for you to have realistic expectations for yourself. Working 6 x 12 hour shifts with minimal sleep would be objectively challenging for anybody and is expected to result in some cognitive errors. I would suggest  that you take more time out for yourself, to do things that you find  restorative, and for self-care activities such as healthy eating, sleep, and relaxing activities.   There are few things as disruptive to brain functioning as not getting a good night's sleep. For sleep, I recommend against using medications, which can have lingering sedating effects on the brain and rob your brain of restful REM sleep. Instead, consider trying some of the following sleep hygiene recommendations. They may not work at once and may take effort, but the effort you spend is likely to be rewarded with better sleep eventually:  . Stick to a sleep schedule of the same bedtime and wake time even on the weekends, which can help to regulate your body's internal clock so that you fall asleep and stay asleep.  . Practice a relaxing bedtime ritual (conducted away from bright lights) which will help separate your sleep from stimulating activities and prepare your body to fall asleep when you go to bed.  . Avoid naps, especially in the afternoon.  . Evaluate your room and create conditions that will promote sleep such as keeping it cool (between 60 - 67 degrees), quiet, and free from any lights. Consider using blackout curtains, a "white noise" generator, or fan that will help mask any noises that might prevent you from going to sleep or awaken you during the night.  . Sleep on a comfortable mattress and pillows.  . Avoid bright light in the evening and excessive use of portable electronic devices right before bed that may contain light frequencies that can contribute to sleep problems.  . Avoid alcohol, cigarettes, or heavy meals in the evening. If you must eat, consume a light snack 45 minutes before bed.  . Use your bed only for sleep to strengthen the association between your bed and sleep.  . If you can't go to sleep within 30 minutes, go into another room and do something relaxing until you feel tired. Then, come back and try to go to sleep again for 30 minutes and repeat until sleep is achieved.   . Some people find over the counter melatonin to be helpful for sleep, which you could discuss with a pharmacist or prescribing provider.   You scored in the moderate range on a measure of depressive symptoms. While you do not report subjective sad mood, you do report diminished interest and pleasure in normal activities and a number of physical symptoms that may have a psychiatric component. It seems that these difficulties arise in the setting of a very challenging work schedule and insufficient time for self care, so taking more time to take care of yourself may go along way. You could also consider psychotherapy or even psychiatric treatment if that is not sufficiently helpful.   The body is the temple of the brain, so I would suggest that you take care of your body by making sure you are getting sufficient sleep, eating a heart-healthy diet, managing your underlying health issues such as your diabetes, and getting some exercise every day.   If desired, it would not be inappropriate to get an MRI of the brain to make sure that nothing is being missed but at your age and given a lack of any "focal" symptoms, I think it is highly unlikely there is an underlying degenerative or vascular cause for your cognitive problems.   Test Findings  Test scores are summarized in additional documentation associated with this encounter. Test scores are relative to age,  gender, and educational history as available and appropriate. There were no significant concerns about performance validity, although she did achieve a marginal score on one validity indicator and arrived to the testing session after having worked a 12 hour shift with no sleep. As a result, I think these test findings are conservative estimates of Vanessa best performance.   General Intellectual Functioning/Achievement:  Performance on single word reading was low average and the RIST Index also suggested low average overall ability. Performance was low  average (almost unusually low) on the verbal subtest and low average on the visually oriented subtest.   Attention and Processing Efficiency: Indicators of attention and working memory were somewhat inconsistent, with a reasonable if not weak low average (almost unusually low) score on the Working Memory Index of the WAIS-IV. Digit repetition forward was unusually low on one measure and average on another measures. Digit repetition backward was low average and digit resequencing in ascending order was unusually low. Mental solving of arithmetic word problems was low average.   Language: Performance on language measures was variable, with an unusually low score on visual object confrontation naming. Given that she only missed 3 items and did not get most of those with cues, this may reflect psychometric properties of the test and limited premorbid knowledge as opposed to a true deficit in this area. Generation of words in response to the letters F-A-S was low average whereas generation of animals in one minute was unusually low.   Visuospatial Function: Indicators of visuospatial and constructional functioning showed reasonable average range performance at a domain level. Figure copy was average and judgment of angular line orientations was also average.   Learning and Memory: Performance on measures of learning and memory was below expectations for Ms. Millay, with extremely low scores on most measures. She did somewhat better retaining visual as compared to verbal information.   In the verbal realm, immediate recall of material including a 10-item word list and short story was extremely low followed by comparable extremely low delayed recall. She did better when provided with recognition cues for the word list, with an unusually low score.   In the visual realm, delayed recall of a modestly complex geometric figure was unusually low.   Executive Functions: Performance on executive indicators was  mixed, with an unusually low score on the Executive Function Composite of the Webster Test. She scored in the extremely low range for categories correct and in the unusually low range for perseverative errors. She also had an unusually low score when reasoning with complex verbal information. Alternating sequencing of numbers and letters of the alphabet was better, with an average score, and that is a very challenging test. Generation of words in response to the letters F-A-S was low average.   Rating Scale(s): Ms. Palazzola screened in the moderately severe range for depressive symptoms. While most of Vanessa endorsed items were somatic, she did acknowledge little interest or pleasure in doing things most days over the past two weeks. She scored in the mild range for anxiety symptoms.   Viviano Simas Nicole Kindred PsyD, [place] Clinical Neuropsychologist

## 2020-10-23 MED FILL — LOSARTAN POTASSIUM 50 MG TA: 50 | 30 days supply | Qty: 60 | Fill #5

## 2020-10-24 ENCOUNTER — Other Ambulatory Visit: Payer: Self-pay

## 2020-10-24 ENCOUNTER — Encounter: Payer: Self-pay | Admitting: Counselor

## 2020-10-24 ENCOUNTER — Ambulatory Visit (INDEPENDENT_AMBULATORY_CARE_PROVIDER_SITE_OTHER): Payer: 59 | Admitting: Counselor

## 2020-10-24 DIAGNOSIS — F4321 Adjustment disorder with depressed mood: Secondary | ICD-10-CM | POA: Diagnosis not present

## 2020-10-24 NOTE — Progress Notes (Signed)
NEUROPSYCHOLOGY FEEDBACK NOTE [facility]  Feedback Note: I met with Vanessa Reeves to review the findings resulting from her neuropsychological evaluation. Since the last appointment, she has been about the same.Time was spent reviewing the impressions and recommendations that are detailed in the evaluation report. We discussed impression of some low test scores, although that is in the context of not sleeping in 24 hours and the pattern of test findings does not appear particularly meaningful. I shared my impression that her difficulties are most likely due to insufficient sleep, cumulative effects of stress, mood disturbance, and perhaps there is some contribution from her autoimmune condition and distraction related to pain. Recommendations and interventions as reflected in the patient instructions. I took time to explain the findings and answer all the patient's questions. I encouraged Vanessa Reeves to contact me should she have any further questions or if further follow up is desired.   Current Medications and Medical History   Current Outpatient Medications  Medication Sig Dispense Refill  . acetaminophen (TYLENOL) 325 MG tablet Take 2 tablets (650 mg total) by mouth every 6 (six) hours as needed. 90 tablet 3  . atenolol (TENORMIN) 50 MG tablet Take 2 tablets (100 mg total) by mouth at bedtime. 180 tablet 1  . atorvastatin (LIPITOR) 40 MG tablet     . Blood Glucose Monitoring Suppl (CONTOUR BLOOD GLUCOSE SYSTEM) w/Device KIT Use as directed to check sugars 1-2 times daily. Dx E11.9 1 kit 2  . cyclobenzaprine (FLEXERIL) 10 MG tablet Take 1 tablet (10 mg total) by mouth at bedtime. 30 tablet 0  . desonide (DESOWEN) 0.05 % cream APPLY TO THE AFFECTED AREA(S) TWICE DAILY 60 g 3  . fluticasone (FLONASE) 50 MCG/ACT nasal spray Place 2 sprays into both nostrils daily. 16 g 6  . furosemide (LASIX) 20 MG tablet Take 1 tablet (20 mg total) by mouth daily. 90 tablet 1  . gabapentin (NEURONTIN)  100 MG capsule Take 1 capsule (100 mg total) by mouth at bedtime. 30 capsule 3  . glucose blood (CONTOUR TEST) test strip Use as instructed to test sugars 1-2 times daily. Dx. E11.9 100 each 12  . hydrochlorothiazide (HYDRODIURIL) 25 MG tablet Take 1 tablet (25 mg total) by mouth daily. Please schedule follow up appt with Dr. Birdie Riddle for further refills. 90 tablet 1  . hydroxychloroquine (PLAQUENIL) 200 MG tablet TAKE 1 TABLET BY MOUTH TWICE DAILY 180 tablet 0  . losartan (COZAAR) 50 MG tablet Take 2 tablets (100 mg total) by mouth daily. 180 tablet 1  . metFORMIN (GLUCOPHAGE-XR) 500 MG 24 hr tablet TAKE 1 TABLET BY MOUTH TWICE DAILY W/ FOOD 180 tablet 1  . omeprazole (PRILOSEC) 20 MG capsule  (Patient not taking: Reported on 09/13/2020)    . ondansetron (ZOFRAN) 4 MG tablet TAKE 1 TABLET BY MOUTH EVERY 6 HOURS 60 tablet 0  . pantoprazole (PROTONIX) 40 MG tablet TAKE 1 TABLET BY MOUTH DAILY. 90 tablet 1  . potassium chloride SA (KLOR-CON) 20 MEQ tablet TAKE 2 TABLETS BY MOUTH DAILY 120 tablet 6  . Safety Lancets 28G MISC Use as directed to check sugars 1-2 times daily. Dx .E11.9 100 each 12  . SSS 10-5 10-5 % FOAM APPLY TO THE AFFECTED AREA ONCE DAILY 60 g 3  . traZODone (DESYREL) 50 MG tablet Take 0.5-1 tablets (25-50 mg total) by mouth at bedtime as needed for sleep. 30 tablet 3  . Vitamin D, Ergocalciferol, (DRISDOL) 1.25 MG (50000 UNIT) CAPS capsule Take 1  capsule (50,000 Units total) by mouth every 7 (seven) days. 12 capsule 0   No current facility-administered medications for this visit.    Patient Active Problem List   Diagnosis Date Noted  . Adjustment disorder with depressed mood 10/24/2020  . Hearing loss of right ear 09/04/2020  . Hyperlipidemia associated with type 2 diabetes mellitus ([place]) 02/21/2020  . Insomnia 02/21/2020  . Vitamin D deficiency 02/21/2020  . Degenerative disc disease, lumbar 02/21/2020  . Diabetic polyneuropathy associated with diabetes mellitus due to  underlying condition ([place]) 06/14/2019  . Visit for preventive health examination 04/11/2018  . Breast cancer screening 04/11/2018  . Memory changes 11/23/2017  . RLS (restless legs syndrome) 03/17/2017  . 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 Reactive airway disease 08/21/2016  . Gastroesophageal reflux disease without esophagitis 12/20/2015  . Possible exposure to STD 06/07/2015  . Physical exam 12/14/2014  . Breast mass, left 02/16/2014  . Depression with anxiety 07/17/2013  . Essential hypertension 07/17/2013  . Eustachian tube dysfunction 07/17/2013  . Severe obesity (BMI >= 40) ([place]) 07/17/2013    Mental Status and Behavioral Observations  Vanessa Reeves presented on time to the present encounter and was alert and generally oriented. Speech was normal in rate, rhythm, volume, and prosody. Self-reported mood was "ok" and affect was neutral to perhaps mildly dysphoric. Thought process was logical and goal oriented and thought content was appropriate to the topics discussed. There were no safety concerns identified at today's encounter, such as thoughts of harming self or others.   Plan  Feedback provided regarding the patient's neuropsychological evaluation. She was referred for counseling, because taking better care of herself will likely involve substantial changes that may be difficult such as setting boundaries with her family. She admits this has always been a problem for her and she doesn't prioritize herself. Vanessa Reeves was encouraged to contact me if any questions arise or if further follow up is desired.   Viviano Simas Nicole Kindred, PsyD, [employer] Clinical Neuropsychologist  Service(s) Provided at This Encounter: 30 minutes [phone]; Psychotherapy with patient/family)

## 2020-10-24 NOTE — Patient Instructions (Signed)
Your performance and presentation on neuropsychological assessment were consistent with difficulties in several areas, most notably on measures of memory, select measures of executive function (e.g., problem solving under uncertainty, cognitive flexibility, etc.), and also on some language tasks. My sense is that these are not representative of an enduring decline and are more related to reversible causes, including insufficient sleep, adjustment related difficulties, and perhaps also distraction related to pain/your autoimmune condition. This means that treating these underlying risk factors may help to get you back on track.   First, I think it is important for you to have realistic expectations for yourself. Working 6 x 12 hour shifts with minimal sleep would be objectively challenging for anybody and is expected to result in some cognitive errors. I would suggest that you take more time out for yourself, to do things that you find restorative, and for self-care activities such as healthy eating, sleep, and relaxing activities.   There are few things as disruptive to brain functioning as not getting a good night's sleep. For sleep, I recommend against using medications, which can have lingering sedating effects on the brain and rob your brain of restful REM sleep. Instead, consider trying some of the following sleep hygiene recommendations. They may not work at once and may take effort, but the effort you spend is likely to be rewarded with better sleep eventually:   Stick to a sleep schedule of the same bedtime and wake time even on the weekends, which can help to regulate your body's internal clock so that you fall asleep and stay asleep.   Practice a relaxing bedtime ritual (conducted away from bright lights) which will help separate your sleep from stimulating activities and prepare your body to fall asleep when you go to bed.   Avoid naps, especially in the afternoon.   Evaluate your room and  create conditions that will promote sleep such as keeping it cool (between 60 - 67 degrees), quiet, and free from any lights. Consider using blackout curtains, a "white noise" generator, or fan that will help mask any noises that might prevent you from going to sleep or awaken you during the night.   Sleep on a comfortable mattress and pillows.   Avoid bright light in the evening and excessive use of portable electronic devices right before bed that may contain light frequencies that can contribute to sleep problems.   Avoid alcohol, cigarettes, or heavy meals in the evening. If you must eat, consume a light snack 45 minutes before bed.   Use your bed only for sleep to strengthen the association between your bed and sleep.   If you can't go to sleep within 30 minutes, go into another room and do something relaxing until you feel tired. Then, come back and try to go to sleep again for 30 minutes and repeat until sleep is achieved.   Some people find over the counter melatonin to be helpful for sleep, which you could discuss with a pharmacist or prescribing provider.   You scored in the moderate range on a measure of depressive symptoms. While you do not report subjective sad mood, you do report diminished interest and pleasure in normal activities and a number of physical symptoms that may have a psychiatric component. It seems that these difficulties arise in the setting of a very challenging work schedule and insufficient time for self care, so taking more time to take care of yourself may go along way. We discussed this and you agree that you  may have a bit of depression. I made a referral to [facility], who will be in touch regarding an appointment.   The body is the temple of the brain, so I would suggest that you take care of your body by making sure you are getting sufficient sleep, eating a heart-healthy diet, managing your underlying health issues such as your diabetes, and  getting some exercise every day.   If desired, it would not be inappropriate to get an MRI of the brain to make sure that nothing is being missed but at your age and given a lack of any "focal" symptoms, I think it is highly unlikely there is an underlying degenerative or vascular cause for your cognitive problems.

## 2020-10-30 ENCOUNTER — Other Ambulatory Visit: Payer: Self-pay | Admitting: Physician Assistant

## 2020-10-30 ENCOUNTER — Other Ambulatory Visit: Payer: Self-pay | Admitting: Family Medicine

## 2020-10-30 DIAGNOSIS — R062 Wheezing: Secondary | ICD-10-CM

## 2020-10-31 ENCOUNTER — Encounter: Payer: Self-pay | Admitting: Family Medicine

## 2020-10-31 ENCOUNTER — Other Ambulatory Visit: Payer: Self-pay | Admitting: Family Medicine

## 2020-11-01 ENCOUNTER — Other Ambulatory Visit: Payer: Self-pay | Admitting: Family Medicine

## 2020-11-04 ENCOUNTER — Other Ambulatory Visit: Payer: Self-pay | Admitting: Family Medicine

## 2020-11-04 MED ORDER — ALBUTEROL SULFATE HFA 108 (90 BASE) MCG/ACT IN AERS: 2.0000 | INHALATION_SPRAY | Freq: Four times a day (QID) | RESPIRATORY_TRACT | 0 refills | Status: DC | PRN

## 2020-11-04 MED FILL — ALBUTEROL SULFATE HFA 108 (: 108 (90 BAS | 25 days supply | Qty: 18 | Fill #0

## 2020-11-05 ENCOUNTER — Other Ambulatory Visit: Payer: Self-pay | Admitting: Physician Assistant

## 2020-11-05 MED FILL — PANTOPRAZOLE SOD DR 40 MG T: 40 | 30 days supply | Qty: 30 | Fill #3

## 2020-11-05 MED FILL — ONDANSETRON HCL 4 MG TABS: 4 | 15 days supply | Qty: 60 | Fill #0

## 2020-11-05 NOTE — Telephone Encounter (Signed)
Next visit: 12/16/2020 Last visit: 07/17/2020 Last RF: 07/04/2020  DX:   Autoimmune disease   Okay to RF Zofran?

## 2020-11-06 ENCOUNTER — Other Ambulatory Visit: Payer: Self-pay | Admitting: Family Medicine

## 2020-11-08 ENCOUNTER — Ambulatory Visit: Payer: 59 | Admitting: Specialist

## 2020-11-11 ENCOUNTER — Ambulatory Visit: Payer: 59 | Admitting: Gastroenterology

## 2020-11-21 ENCOUNTER — Other Ambulatory Visit: Payer: Self-pay | Admitting: Family Medicine

## 2020-11-21 MED FILL — SSS 10-5 FOAM: 10-5 | 30 days supply | Qty: 60 | Fill #0

## 2020-11-22 ENCOUNTER — Ambulatory Visit: Payer: 59 | Admitting: Specialist

## 2020-11-27 ENCOUNTER — Ambulatory Visit: Payer: 59 | Admitting: Specialist

## 2020-11-27 ENCOUNTER — Encounter: Payer: Self-pay | Admitting: Specialist

## 2020-11-27 ENCOUNTER — Other Ambulatory Visit: Payer: Self-pay | Admitting: Specialist

## 2020-11-27 VITALS — BP 164/98 | HR 81 | Ht 65.0 in | Wt 320.0 lb

## 2020-11-27 DIAGNOSIS — Z6841 Body Mass Index (BMI) 40.0 and over, adult: Secondary | ICD-10-CM | POA: Diagnosis not present

## 2020-11-27 DIAGNOSIS — G9519 Other vascular myelopathies: Secondary | ICD-10-CM

## 2020-11-27 DIAGNOSIS — M5136 Other intervertebral disc degeneration, lumbar region: Secondary | ICD-10-CM | POA: Diagnosis not present

## 2020-11-27 DIAGNOSIS — M48062 Spinal stenosis, lumbar region with neurogenic claudication: Secondary | ICD-10-CM

## 2020-11-27 MED ORDER — CYCLOBENZAPRINE HCL 10 MG PO TABS: 10.0000 mg | ORAL_TABLET | Freq: Every day | ORAL | 0 refills | Status: DC

## 2020-11-27 MED ORDER — GABAPENTIN 300 MG PO CAPS: 300.0000 mg | ORAL_CAPSULE | Freq: Every day | ORAL | 3 refills | Status: DC

## 2020-11-27 MED ORDER — TRAMADOL-ACETAMINOPHEN 37.5-325 MG PO TABS: 1.0000 | ORAL_TABLET | Freq: Four times a day (QID) | ORAL | 0 refills | Status: DC | PRN

## 2020-11-27 MED FILL — TRAMADOL-ACETAMINOPHN 37.5-: 37.5-325 | 7 days supply | Qty: 30 | Fill #0

## 2020-11-27 MED FILL — CYCLOBENZAPRINE HCL 10 MG T: 10 | 30 days supply | Qty: 30 | Fill #0

## 2020-11-27 MED FILL — GABAPENTIN 300 MG CAPSULE: 300 | 30 days supply | Qty: 30 | Fill #0

## 2020-11-27 NOTE — Patient Instructions (Signed)
Plan: Avoid bending, stooping and avoid lifting weights greater than 10 lbs. Avoid prolong standing and walking. Avoid frequent bending and stooping  No lifting greater than 10 lbs. May use ice or moist heat for pain. Weight loss is of benefit. Handicap license is approved.  Dynamic spondylolisthesis and spinal stenosis  Worse with standing and walking.  Gabapentin300mg  po qbedtime for neurogenic pain. Advised to call her primary care MD and inquire about referral to nutrition specialist for weight loss as her Size and BMI greater than 50 makes her a poor candidate for surgical solution, she may need to consider a  Bariatric procedure to decrease her BMI to less than 40. Follow-Up Instructions: Return in about 4 weeks (around 10/09/2020).

## 2020-11-27 NOTE — Progress Notes (Signed)
Office Visit Note   Patient: Vanessa Reeves           Date of Birth: 05-13-1971           MRN: [id number] Visit Date: 11/27/2020              Requested by: Midge Minium, MD [address] [place],  [place] [address] PCP: Midge Minium, MD   Assessment & Plan: Visit Diagnoses:  1. DDD (degenerative disc disease), lumbar     Plan: Plan: Avoid bending, stooping and avoid lifting weights greater than 10 lbs. Avoid prolong standing and walking. Avoid frequent bending and stooping  No lifting greater than 10 lbs. May use ice or moist heat for pain. Weight loss is of benefit. Handicap license is approved.  Dynamic spondylolisthesis and spinal stenosis  Worse with standing and walking.  Gabapentin300mg  po qbedtime for neurogenic pain. Advised to call her primary care MD and inquire about referral to nutrition specialist for weight loss as her Size and BMI greater than 50 makes her a poor candidate for surgical solution, she may need to consider a  Bariatric procedure to decrease her BMI to less than 40. Follow-Up Instructions: Return in about 4 weeks (around 10/09/2020).   Follow-Up Instructions: No follow-ups on file.   Orders:  No orders of the defined types were placed in this encounter.  No orders of the defined types were placed in this encounter.     Procedures: No procedures performed   Clinical Data: No additional findings.   Subjective: Chief Complaint  Patient presents with  . Lower Back - Follow-up    CT Myelogram review    50 year old female with a history of back pain and right leg thigh and calf pain into the anterior and lateral thigh and calf pain. The pain is into the right anterior thigh and calf with standing and walking. She has good and bad days. Underwent recent  Myelogram and post myelogram CT scan and the result show a mild disc protrusion right T12-L1 and L4-5.    Review of Systems  Constitutional: Negative.   HENT:  Negative.   Eyes: Negative.   Respiratory: Negative.   Cardiovascular: Negative.   Gastrointestinal: Negative.   Endocrine: Negative.   Genitourinary: Negative.   Musculoskeletal: Positive for back pain and gait problem.  Skin: Negative.   Allergic/Immunologic: Negative.   Hematological: Negative.   Psychiatric/Behavioral: Negative.      Objective: Vital Signs: BP (!) 164/98 (BP Location: Left Arm, Patient Position: Sitting)   Pulse 81   Ht 5\' 5"  (1.651 m)   Wt (!) 320 lb (145.2 kg)   LMP 12/04/2011   BMI 53.25 kg/m   Physical Exam  Ortho Exam  Specialty Comments:  No specialty comments available.  Imaging: No results found.   PMFS History: Patient Active Problem List   Diagnosis Date Noted  . Adjustment disorder with depressed mood 10/24/2020  . Hearing loss of right ear 09/04/2020  . Hyperlipidemia associated with type 2 diabetes mellitus ([place]) 02/21/2020  . Insomnia 02/21/2020  . Vitamin D deficiency 02/21/2020  . Degenerative disc disease, lumbar 02/21/2020  . Diabetic polyneuropathy associated with diabetes mellitus due to underlying condition ([place]) 06/14/2019  . Visit for preventive health examination 04/11/2018  . Breast cancer screening 04/11/2018  . Memory changes 11/23/2017  . RLS (restless legs syndrome) 03/17/2017  . 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 Reactive airway disease 08/21/2016  .  Gastroesophageal reflux disease without esophagitis 12/20/2015  . Possible exposure to STD 06/07/2015  . Physical exam 12/14/2014  . Breast mass, left 02/16/2014  . Depression with anxiety 07/17/2013  . Essential hypertension 07/17/2013  . Eustachian tube dysfunction 07/17/2013  . Severe obesity (BMI >= 40) ([place]) 07/17/2013   Past Medical History:  Diagnosis Date  . Anxiety   . Diabetes mellitus without complication ([place])   . Herpes   . Hypertension   . RLS (restless legs syndrome) 03/17/2017   Right leg     Family History  Problem Relation Age of Onset  . Hypertension Mother   . Stroke Father   . Hypertension Paternal Grandmother   . Diabetes Mellitus II Paternal Grandmother   . Healthy Daughter   . Healthy Son     Past Surgical History:  Procedure Laterality Date  . ABDOMINAL HYSTERECTOMY    . DILATION AND CURETTAGE OF UTERUS    . TUBAL LIGATION     Social History   Occupational History  . Not on file  Tobacco Use  . Smoking status: Former Smoker    Types: Cigarettes  . Smokeless tobacco: Never Used  . Tobacco comment: OCC  Vaping Use  . Vaping Use: Never used  Substance and Sexual Activity  . Alcohol use: Yes    Alcohol/week: 0.0 standard drinks    Comment: socially  . Drug use: No  . Sexual activity: Yes    Birth control/protection: Surgical

## 2020-12-02 NOTE — Progress Notes (Signed)
Office Visit Note  Patient: Vanessa Reeves             Date of Birth: 24-May-1971           MRN: [id number]             PCP: Midge Minium, MD Referring: Midge Minium, MD Visit Date: 12/16/2020 Occupation: @GUAROCC @  Subjective:  Low back pain   History of Present Illness: Vanessa Reeves is a 50 y.o. female with history of autoimmune disease and DDD. She is taking plaquenil 200 mg 1 tablet by mouth twice daily.  She has been tolerating PLQ without any side effects.  She denies any signs or symptoms of a flare recently.  She has not had any recent rashes.  She has had increased frequency of symptoms of Raynaud's with cooler weather temperatures.  She denies any digital ulcerations.  She has occasional oral ulcerations but denies any nasal ulcerations.  She has ongoing mouth dryness which is typically tolerable with keeping her fluid intake up throughout the day.  She denies any swollen lymph nodes.  She has not had any recent infections.  She has noticed some increased hair loss over the past several months. She continues to have chronic lower back pain. She continues to have intermittent symptoms of right sided radiculopathy and occasional nocturnal pain. She was evaluated by Dr. Louanne Skye on 09/11/20 and has undergone a thorough work up.  She will be starting physical therapy next week.  She has been taking flexeril 10 mg at bedtime, Gabapentin 300 mg at bedtime, and ultracet 1 tablet by mouth every 6 hours as needed for pain relief.  She has noticed some decreased grip strength.  She denies any joint pain or joint swelling.     Activities of Daily Living:  Patient reports morning stiffness for 5-10 minutes.   Patient Reports nocturnal pain.  Difficulty dressing/grooming: Denies Difficulty climbing stairs: Reports Difficulty getting out of chair: Reports Difficulty using hands for taps, buttons, cutlery, and/or writing: Reports  Review of Systems  Constitutional: Positive  for fatigue.  HENT: Negative for mouth sores, mouth dryness and nose dryness.   Eyes: Positive for pain. Negative for itching and dryness.  Respiratory: Positive for cough. Negative for hemoptysis and difficulty breathing.   Cardiovascular: Negative for chest pain, palpitations and swelling in legs/feet.  Gastrointestinal: Positive for constipation. Negative for abdominal pain, blood in stool and diarrhea.  Endocrine: Negative for increased urination.  Genitourinary: Negative for painful urination.  Musculoskeletal: Positive for arthralgias, joint pain and morning stiffness. Negative for joint swelling, myalgias, muscle weakness, muscle tenderness and myalgias.  Skin: Negative for color change, rash and redness.  Allergic/Immunologic: Negative for susceptible to infections.  Neurological: Negative for dizziness, numbness, headaches, memory loss and weakness.  Hematological: Negative for swollen glands.  Psychiatric/Behavioral: Positive for sleep disturbance. Negative for confusion.    PMFS History:  Patient Active Problem List   Diagnosis Date Noted  . Adjustment disorder with depressed mood 10/24/2020  . Hearing loss of right ear 09/04/2020  . Hyperlipidemia associated with type 2 diabetes mellitus ([place]) 02/21/2020  . Insomnia 02/21/2020  . Vitamin D deficiency 02/21/2020  . Degenerative disc disease, lumbar 02/21/2020  . Diabetic polyneuropathy associated with diabetes mellitus due to underlying condition ([place]) 06/14/2019  . Visit for preventive health examination 04/11/2018  . Breast cancer screening 04/11/2018  . Memory changes 11/23/2017  . RLS (restless legs syndrome) 03/17/2017  . ADHD (attention deficit hyperactivity disorder), inattentive type  08/21/2016  . Reactive airway disease 08/21/2016  . Gastroesophageal reflux disease without esophagitis 12/20/2015  . Possible exposure to STD 06/07/2015  . Physical exam 12/14/2014  . Breast mass, left 02/16/2014  . Depression with  anxiety 07/17/2013  . Essential hypertension 07/17/2013  . Eustachian tube dysfunction 07/17/2013  . Severe obesity (BMI >= 40) ([place]) 07/17/2013    Past Medical History:  Diagnosis Date  . Anxiety   . Diabetes mellitus without complication ([place])   . Herpes   . Hypertension   . RLS (restless legs syndrome) 03/17/2017   Right leg    Family History  Problem Relation Age of Onset  . Hypertension Mother   . Stroke Father   . Hypertension Paternal Grandmother   . Diabetes Mellitus II Paternal Grandmother   . Healthy Daughter   . Healthy Son    Past Surgical History:  Procedure Laterality Date  . ABDOMINAL HYSTERECTOMY    . DILATION AND CURETTAGE OF UTERUS    . TUBAL LIGATION     Social History   Social History Narrative   Lives with husband and 2 children in a 2 story home.     Works as a Chartered certified accountant at [employer].     Highest level of education:  High school, in college now   Immunization History  Administered Date(s) Administered  . Influenza,inj,Quad PF,6+ Mos 07/17/2013, 07/27/2014, 07/26/2015, 08/21/2016, 07/06/2017, 07/29/2018, 07/03/2019  . Influenza-Unspecified 08/20/2020  . MMR 03/08/2017, 04/05/2017  . Moderna Sars-Covid-2 Vaccination 06/06/2020, 07/04/2020  . PPD Test 07/26/2015, 03/07/2018, 05/23/2018  . Pneumococcal Polysaccharide-23 01/16/2015  . Tdap 10/14/2015     Objective: Vital Signs: BP 121/77 (BP Location: Left Arm, Patient Position: Sitting, Cuff Size: Normal)   Pulse 68   Ht $R'5\' 5"'xY$  (1.651 m)   Wt (!) 313 lb 3.2 oz (142.1 kg)   LMP 12/04/2011   BMI 52.12 kg/m    Physical Exam Vitals and nursing note reviewed.  Constitutional:      Appearance: She is well-developed and well-nourished.  HENT:     Head: Normocephalic and atraumatic.  Eyes:     Extraocular Movements: EOM normal.     Conjunctiva/sclera: Conjunctivae normal.  Cardiovascular:     Pulses: Intact distal pulses.  Pulmonary:     Effort: Pulmonary effort is normal.  Abdominal:      Palpations: Abdomen is soft.  Musculoskeletal:     Cervical back: Normal range of motion.  Skin:    General: Skin is warm and dry.     Capillary Refill: Capillary refill takes less than 2 seconds.  Neurological:     Mental Status: She is alert and oriented to person, place, and time.  Psychiatric:        Mood and Affect: Mood and affect normal.        Behavior: Behavior normal.      Musculoskeletal Exam: C-spine good ROM with no discomfort.  Painful ROM of lumbar spine.  No midline spinal tenderness.  No SI joint tenderness.  Shoulder joints, elbow joints, wrist joints, MCPs, PIPs, and DIPs good ROM with no synovitis.  Complete fist formation bilaterally.  Hip joints, knee joints, and ankle joints good ROM with no discomfort.  No warmth or effusion of knee joints.  No tenderness or swelling of ankle joints.   CDAI Exam: CDAI Score: -- Patient Global: --; Provider Global: -- Swollen: --; Tender: -- Joint Exam 12/16/2020   No joint exam has been documented for this visit   There is  currently no information documented on the homunculus. Go to the Rheumatology activity and complete the homunculus joint exam.  Investigation: No additional findings.  Imaging: No results found.  Recent Labs: Lab Results  Component Value Date   WBC 7.1 09/04/2020   HGB 11.9 (L) 09/04/2020   PLT 387.0 09/04/2020   NA 141 09/04/2020   K 3.9 09/04/2020   CL 102 09/04/2020   CO2 33 (H) 09/04/2020   GLUCOSE 121 (H) 09/04/2020   BUN 11 09/04/2020   CREATININE 0.83 09/04/2020   BILITOT 0.3 09/04/2020   ALKPHOS 74 09/04/2020   AST 12 09/04/2020   ALT 11 09/04/2020   PROT 7.4 09/04/2020   ALBUMIN 3.8 09/04/2020   CALCIUM 9.5 09/04/2020   GFRAA 81 07/17/2020   QFTBGOLDPLUS NEGATIVE 07/03/2019    Speciality Comments: PLQ Eye Exam: 08/26/2020 WNL @ [facility] Follow up 05/2021  Procedures:  No procedures performed Allergies: Penicillins      Assessment / Plan:     Visit  Diagnoses: Autoimmune disease ([place]) - ANA 1: 160 nuclear homogenous, anti-Ro positive, ESR 58, Oral ulcers, Raynaud's, photosensitivity, arthralgias, family history of lupus-maternal grandmother: She has not been experiencing any signs or symptoms of a flare recently.  She is clinically doing well taking Plaquenil 200 mg 1 tablet by mouth twice daily.  She is tolerating Plaquenil without any side effects and has not missed any doses recently.  She has had a slight increased frequency of symptoms of Raynaud's with the cooler weather temperatures.  No digital ulcerations or signs of gangrene were noted on examination today.  We discussed the importance of avoiding triggers as well as wearing gloves and socks on a daily basis.  She has not had any recent rashes.  No malar rash was noted on exam.  She has been experiencing increased hair loss and was encouraged to start taking a biotin supplement on a daily basis.  She has occasional oral ulcers but no nasal ulcers.  She experiences mouth dryness on a daily basis and tries to drink water throughout the day.  Discussed the use of OTC products (biotene).  No cervical lymphadenopathy was palpable.  She continues to have chronic lower back pain and will be starting physical therapy next week.  She has not had any other joint pain or inflammation.  No synovitis was noted.Lab work from 07/17/20 was reviewed with the patient today in the office: dsDNA<1, ESR 48, C4 60, and C3 175.  We will repeat the following lab work today.  She will continue taking Plaquenil as prescribed.  She was advised to notify us if she develops any new or worsening symptoms.  She will follow-up in the office in 5 months. - Plan: CBC with Differential/Platelet, COMPLETE METABOLIC PANEL WITH GFR, Urinalysis, Routine w reflex microscopic, Anti-DNA antibody, double-stranded, C3 and C4, Sedimentation rate, ANA, Sjogrens syndrome-A extractable nuclear antibody, Rheumatoid factor, Serum protein  electrophoresis with reflex, hydroxychloroquine (PLAQUENIL) 200 MG tablet  High risk medication use - Plaquenil 200 mg 1 tablet by mouth twice daily.  CBC, BMP, hepatic function panel were updated on 09/04/2020.  We will repeat CBC and CMP today to monitor for drug toxicity. PLQ Eye Exam: 08/26/2020 WNL @ [facility] Follow up 05/2021.   - Plan: CBC with Differential/Platelet, COMPLETE METABOLIC PANEL WITH GFR She has not had any recent infections.   Elevated sed rate -She has chronic elevation of sed rate.  ESR on 08/07/2019 was 130, 09/08/2019: 58, 03/08/2020: 46, and  07/07/2020: 48.  We will check sed rate, RF, and SPEP today.  Plan: Sedimentation rate, Rheumatoid factor, Serum protein electrophoresis with reflex  Other fatigue: She has chronic fatigue which has been stable.  Vitamin D deficiency: Vitamin D was 19.22 on 09/04/2020.  She took vitamin D 50,000 units by mouth once weekly for 3 months but has completed the prescription.  She was encouraged to take the maintenance dose of vitamin D 2000 units daily.  DDD (degenerative disc disease), lumbar - She has CT of lumbar spine on 10/07/20 ordered by Dr. Louanne Skye: Broad right posterolateral protrusion T12-L1 with minimal distortion of the anterior cord, no significant spinal stenosis.  Moderate facet DJD L4-L5 with mild grade 1 anterolisthesis.  Mild disc bulge L5-S1 without compressive pathology.  She has ongoing lower back pain.  She experiences intermittent symptoms of right-sided radiculopathy. She will be starting physical therapy next week.   Facet arthropathy: Noted on x-ray and CT of lumbar spine.  She will be starting physical therapy next week.  Chronic SI joint pain: She had no SI joint tenderness on examination.   Other medical conditions are listed as follows:   Diabetic polyneuropathy associated with diabetes mellitus due to underlying condition ([place])  Gastroesophageal reflux disease without esophagitis  Essential  hypertension  Depression with anxiety  Moderate persistent reactive airway disease with acute exacerbation  ADHD (attention deficit hyperactivity disorder), inattentive type  RLS (restless legs syndrome)  Former smoker    Orders: Orders Placed This Encounter  Procedures  . CBC with Differential/Platelet  . COMPLETE METABOLIC PANEL WITH GFR  . Urinalysis, Routine w reflex microscopic  . Anti-DNA antibody, double-stranded  . C3 and C4  . Sedimentation rate  . ANA  . Sjogrens syndrome-A extractable nuclear antibody  . Rheumatoid factor  . Serum protein electrophoresis with reflex   Meds ordered this encounter  Medications  . hydroxychloroquine (PLAQUENIL) 200 MG tablet    Sig: Take 1 tablet (200 mg total) by mouth 2 (two) times daily.    Dispense:  180 tablet    Refill:  0    Follow-Up Instructions: Return in about 5 months (around 05/15/2021) for Autoimmune Disease.   Ofilia Neas, PA-C  Note - This record has been created using Dragon software.  Chart creation errors have been sought, but may not always  have been located. Such creation errors do not reflect on  the standard of medical care.

## 2020-12-05 ENCOUNTER — Other Ambulatory Visit: Payer: Self-pay | Admitting: Family Medicine

## 2020-12-05 ENCOUNTER — Encounter: Payer: Self-pay | Admitting: Family Medicine

## 2020-12-05 MED FILL — LOSARTAN POTASSIUM 50 MG TA: 50 | 30 days supply | Qty: 60 | Fill #0

## 2020-12-10 ENCOUNTER — Ambulatory Visit: Payer: 59 | Admitting: Gastroenterology

## 2020-12-16 ENCOUNTER — Other Ambulatory Visit: Payer: Self-pay

## 2020-12-16 ENCOUNTER — Other Ambulatory Visit: Payer: Self-pay | Admitting: Physician Assistant

## 2020-12-16 ENCOUNTER — Ambulatory Visit: Payer: 59 | Admitting: Physician Assistant

## 2020-12-16 ENCOUNTER — Encounter: Payer: Self-pay | Admitting: Physician Assistant

## 2020-12-16 VITALS — BP 121/77 | HR 68 | Ht 65.0 in | Wt 313.2 lb

## 2020-12-16 DIAGNOSIS — E0842 Diabetes mellitus due to underlying condition with diabetic polyneuropathy: Secondary | ICD-10-CM | POA: Diagnosis not present

## 2020-12-16 DIAGNOSIS — M5136 Other intervertebral disc degeneration, lumbar region: Secondary | ICD-10-CM

## 2020-12-16 DIAGNOSIS — M359 Systemic involvement of connective tissue, unspecified: Secondary | ICD-10-CM

## 2020-12-16 DIAGNOSIS — M51369 Other intervertebral disc degeneration, lumbar region without mention of lumbar back pain or lower extremity pain: Secondary | ICD-10-CM

## 2020-12-16 DIAGNOSIS — M47819 Spondylosis without myelopathy or radiculopathy, site unspecified: Secondary | ICD-10-CM

## 2020-12-16 DIAGNOSIS — R700 Elevated erythrocyte sedimentation rate: Secondary | ICD-10-CM | POA: Diagnosis not present

## 2020-12-16 DIAGNOSIS — G2581 Restless legs syndrome: Secondary | ICD-10-CM

## 2020-12-16 DIAGNOSIS — M533 Sacrococcygeal disorders, not elsewhere classified: Secondary | ICD-10-CM | POA: Diagnosis not present

## 2020-12-16 DIAGNOSIS — J4541 Moderate persistent asthma with (acute) exacerbation: Secondary | ICD-10-CM

## 2020-12-16 DIAGNOSIS — R5383 Other fatigue: Secondary | ICD-10-CM | POA: Diagnosis not present

## 2020-12-16 DIAGNOSIS — K219 Gastro-esophageal reflux disease without esophagitis: Secondary | ICD-10-CM

## 2020-12-16 DIAGNOSIS — F900 Attention-deficit hyperactivity disorder, predominantly inattentive type: Secondary | ICD-10-CM

## 2020-12-16 DIAGNOSIS — Z79899 Other long term (current) drug therapy: Secondary | ICD-10-CM | POA: Diagnosis not present

## 2020-12-16 DIAGNOSIS — E559 Vitamin D deficiency, unspecified: Secondary | ICD-10-CM | POA: Diagnosis not present

## 2020-12-16 DIAGNOSIS — I10 Essential (primary) hypertension: Secondary | ICD-10-CM

## 2020-12-16 DIAGNOSIS — F418 Other specified anxiety disorders: Secondary | ICD-10-CM

## 2020-12-16 DIAGNOSIS — Z87891 Personal history of nicotine dependence: Secondary | ICD-10-CM

## 2020-12-16 DIAGNOSIS — G8929 Other chronic pain: Secondary | ICD-10-CM

## 2020-12-16 MED ORDER — HYDROXYCHLOROQUINE SULFATE 200 MG PO TABS: 200.0000 mg | ORAL_TABLET | Freq: Two times a day (BID) | ORAL | 0 refills | Status: DC

## 2020-12-16 MED FILL — FLUTICASONE PROP 50 MCG SPR: 50 | 30 days supply | Qty: 16 | Fill #1

## 2020-12-16 NOTE — Patient Instructions (Signed)
Hand Exercises Hand exercises can be helpful for almost anyone. These exercises can strengthen the hands, improve flexibility and movement, and increase blood flow to the hands. These results can make work and daily tasks easier. Hand exercises can be especially helpful for people who have joint pain from arthritis or have nerve damage from overuse (carpal tunnel syndrome). These exercises can also help people who have injured a hand. Exercises Most of these hand exercises are gentle stretching and motion exercises. It is usually safe to do them often throughout the day. Warming up your hands before exercise may help to reduce stiffness. You can do this with gentle massage or by placing your hands in warm water for 10-15 minutes. It is normal to feel some stretching, pulling, tightness, or mild discomfort as you begin new exercises. This will gradually improve. Stop an exercise right away if you feel sudden, severe pain or your pain gets worse. Ask your health care provider which exercises are best for you. Knuckle bend or "claw" fist 1. Stand or sit with your arm, hand, and all five fingers pointed straight up. Make sure to keep your wrist straight during the exercise. 2. Gently bend your fingers down toward your palm until the tips of your fingers are touching the top of your palm. Keep your big knuckle straight and just bend the small knuckles in your fingers. 3. Hold this position for __________ seconds. 4. Straighten (extend) your fingers back to the starting position. Repeat this exercise 5-10 times with each hand. Full finger fist 1. Stand or sit with your arm, hand, and all five fingers pointed straight up. Make sure to keep your wrist straight during the exercise. 2. Gently bend your fingers into your palm until the tips of your fingers are touching the middle of your palm. 3. Hold this position for __________ seconds. 4. Extend your fingers back to the starting position, stretching every  joint fully. Repeat this exercise 5-10 times with each hand. Straight fist 1. Stand or sit with your arm, hand, and all five fingers pointed straight up. Make sure to keep your wrist straight during the exercise. 2. Gently bend your fingers at the big knuckle, where your fingers meet your hand, and the middle knuckle. Keep the knuckle at the tips of your fingers straight and try to touch the bottom of your palm. 3. Hold this position for __________ seconds. 4. Extend your fingers back to the starting position, stretching every joint fully. Repeat this exercise 5-10 times with each hand. Tabletop 1. Stand or sit with your arm, hand, and all five fingers pointed straight up. Make sure to keep your wrist straight during the exercise. 2. Gently bend your fingers at the big knuckle, where your fingers meet your hand, as far down as you can while keeping the small knuckles in your fingers straight. Think of forming a tabletop with your fingers. 3. Hold this position for __________ seconds. 4. Extend your fingers back to the starting position, stretching every joint fully. Repeat this exercise 5-10 times with each hand. Finger spread 1. Place your hand flat on a table with your palm facing down. Make sure your wrist stays straight as you do this exercise. 2. Spread your fingers and thumb apart from each other as far as you can until you feel a gentle stretch. Hold this position for __________ seconds. 3. Bring your fingers and thumb tight together again. Hold this position for __________ seconds. Repeat this exercise 5-10 times with each hand.   Making circles 1. Stand or sit with your arm, hand, and all five fingers pointed straight up. Make sure to keep your wrist straight during the exercise. 2. Make a circle by touching the tip of your thumb to the tip of your index finger. 3. Hold for __________ seconds. Then open your hand wide. 4. Repeat this motion with your thumb and each finger on your  hand. Repeat this exercise 5-10 times with each hand. Thumb motion 1. Sit with your forearm resting on a table and your wrist straight. Your thumb should be facing up toward the ceiling. Keep your fingers relaxed as you move your thumb. 2. Lift your thumb up as high as you can toward the ceiling. Hold for __________ seconds. 3. Bend your thumb across your palm as far as you can, reaching the tip of your thumb for the small finger (pinkie) side of your palm. Hold for __________ seconds. Repeat this exercise 5-10 times with each hand. Grip strengthening 1. Hold a stress ball or other soft ball in the middle of your hand. 2. Slowly increase the pressure, squeezing the ball as much as you can without causing pain. Think of bringing the tips of your fingers into the middle of your palm. All of your finger joints should bend when doing this exercise. 3. Hold your squeeze for __________ seconds, then relax. Repeat this exercise 5-10 times with each hand.   Contact a health care provider if:  Your hand pain or discomfort gets much worse when you do an exercise.  Your hand pain or discomfort does not improve within 2 hours after you exercise. If you have any of these problems, stop doing these exercises right away. Do not do them again unless your health care provider says that you can. Get help right away if:  You develop sudden, severe hand pain or swelling. If this happens, stop doing these exercises right away. Do not do them again unless your health care provider says that you can. This information is not intended to replace advice given to you by your health care provider. Make sure you discuss any questions you have with your health care provider. Document Revised: 02/09/2019 Document Reviewed: 10/20/2018 Elsevier Patient Education  2021 [employer]vier Inc.  

## 2020-12-18 LAB — URINALYSIS, ROUTINE W REFLEX MICROSCOPIC
Bilirubin Urine: NEGATIVE
Glucose, UA: NEGATIVE
Hgb urine dipstick: NEGATIVE
Ketones, ur: NEGATIVE
Leukocytes,Ua: NEGATIVE
Nitrite: NEGATIVE
Protein, ur: NEGATIVE
Specific Gravity, Urine: 1.015 (ref 1.001–1.03)
pH: <=5 (ref 5.0–8.0)

## 2020-12-18 LAB — CBC WITH DIFFERENTIAL/PLATELET
Absolute Monocytes: 440 cells/uL (ref 200–950)
Basophils Absolute: 31 cells/uL (ref 0–200)
Basophils Relative: 0.5 %
Eosinophils Absolute: 242 cells/uL (ref 15–500)
Eosinophils Relative: 3.9 %
HCT: 37.9 % (ref 35.0–45.0)
Hemoglobin: 12.1 g/dL (ref 11.7–15.5)
Lymphs Abs: 2585 cells/uL (ref 850–3900)
MCH: 27.1 pg (ref 27.0–33.0)
MCHC: 31.9 g/dL — ABNORMAL LOW (ref 32.0–36.0)
MCV: 85 fL (ref 80.0–100.0)
MPV: 9.3 fL (ref 7.5–12.5)
Monocytes Relative: 7.1 %
Neutro Abs: 2902 cells/uL (ref 1500–7800)
Neutrophils Relative %: 46.8 %
Platelets: 408 10*3/uL — ABNORMAL HIGH (ref 140–400)
RBC: 4.46 10*6/uL (ref 3.80–5.10)
RDW: 15.1 % — ABNORMAL HIGH (ref 11.0–15.0)
Total Lymphocyte: 41.7 %
WBC: 6.2 10*3/uL (ref 3.8–10.8)

## 2020-12-18 LAB — ANTI-NUCLEAR AB-TITER (ANA TITER)
ANA TITER: 1:80 {titer} — ABNORMAL HIGH
ANA Titer 1: 1:80 {titer} — ABNORMAL HIGH

## 2020-12-18 LAB — COMPLETE METABOLIC PANEL WITH GFR
AG Ratio: 1.2 (calc) (ref 1.0–2.5)
ALT: 14 U/L (ref 6–29)
AST: 13 U/L (ref 10–35)
Albumin: 4.1 g/dL (ref 3.6–5.1)
Alkaline phosphatase (APISO): 82 U/L (ref 31–125)
BUN: 12 mg/dL (ref 7–25)
CO2: 31 mmol/L (ref 20–32)
Calcium: 9.6 mg/dL (ref 8.6–10.2)
Chloride: 101 mmol/L (ref 98–110)
Creat: 1.03 mg/dL (ref 0.50–1.10)
GFR, Est African American: 74 mL/min/{1.73_m2} (ref >=60)
GFR, Est Non African American: 64 mL/min/{1.73_m2} (ref >=60)
Globulin: 3.3 g/dL (calc) (ref 1.9–3.7)
Glucose, Bld: 114 mg/dL — ABNORMAL HIGH (ref 65–99)
Potassium: 3.9 mmol/L (ref 3.5–5.3)
Sodium: 141 mmol/L (ref 135–146)
Total Bilirubin: 0.3 mg/dL (ref 0.2–1.2)
Total Protein: 7.4 g/dL (ref 6.1–8.1)

## 2020-12-18 LAB — PROTEIN ELECTROPHORESIS, SERUM, WITH REFLEX
Albumin ELP: 3.8 g/dL (ref 3.8–4.8)
Alpha 1: 0.4 g/dL — ABNORMAL HIGH (ref 0.2–0.3)
Alpha 2: 0.9 g/dL (ref 0.5–0.9)
Beta 2: 0.6 g/dL — ABNORMAL HIGH (ref 0.2–0.5)
Beta Globulin: 0.5 g/dL (ref 0.4–0.6)
Gamma Globulin: 1.7 g/dL (ref 0.8–1.7)
Total Protein: 7.9 g/dL (ref 6.1–8.1)

## 2020-12-18 LAB — C3 AND C4
C3 Complement: 184 mg/dL (ref 83–193)
C4 Complement: 64 mg/dL — ABNORMAL HIGH (ref 15–57)

## 2020-12-18 LAB — SJOGRENS SYNDROME-A EXTRACTABLE NUCLEAR ANTIBODY: SSA (Ro) (ENA) Antibody, IgG: <1 AI

## 2020-12-18 LAB — RHEUMATOID FACTOR: Rheumatoid fact SerPl-aCnc: <14 IU/mL (ref <14)

## 2020-12-18 LAB — SEDIMENTATION RATE: Sed Rate: 55 mm/h — ABNORMAL HIGH (ref 0–20)

## 2020-12-18 LAB — ANTI-DNA ANTIBODY, DOUBLE-STRANDED: ds DNA Ab: 1 IU/mL

## 2020-12-18 LAB — ANA: Anti Nuclear Antibody (ANA): POSITIVE — AB

## 2020-12-19 NOTE — Progress Notes (Signed)
Platelet count is borderline elevated. Glucose is 114. Rest of CMP WNL.  UA normal.  ANA remains positive but low, nonspecific titer. dsDNA is negative. ESR remains elevated but is stable.  C4 is elevated, C3 WNL.  Ro antibody negative.  RF negative.  SPEP did not reveal any monoclonal proteins.    Labs are not consistent with a flare.  Continue current dose of plaquenil.

## 2020-12-20 MED FILL — HYDROXYCHLOROQUINE 200 MG T: 200 | 90 days supply | Qty: 180 | Fill #0

## 2021-01-02 ENCOUNTER — Ambulatory Visit: Payer: 59 | Admitting: Family Medicine

## 2021-01-08 ENCOUNTER — Other Ambulatory Visit: Payer: Self-pay | Admitting: Family Medicine

## 2021-01-08 MED FILL — traZODone HCL 50 MG TABS: 50 | 30 days supply | Qty: 30 | Fill #2

## 2021-01-08 MED FILL — LOSARTAN POTASSIUM 50 MG TA: 50 | 30 days supply | Qty: 60 | Fill #1

## 2021-01-08 MED FILL — metFORMIN HCL ER 500 MG TB2: 500 | 30 days supply | Qty: 60 | Fill #4

## 2021-01-08 MED FILL — FUROSEMIDE 20 MG TABS: 20 | 30 days supply | Qty: 30 | Fill #5

## 2021-01-08 MED FILL — HYDROCHLOROTHIAZIDE 25 MG T: 25 | 30 days supply | Qty: 30 | Fill #4

## 2021-01-08 MED FILL — SSS 10-5 FOAM: 10-5 | 30 days supply | Qty: 60 | Fill #1

## 2021-01-08 MED FILL — ATENOLOL 50 MG TABLET: 50 | 30 days supply | Qty: 60 | Fill #4

## 2021-01-27 ENCOUNTER — Ambulatory Visit: Payer: 59 | Admitting: Gastroenterology

## 2021-01-27 ENCOUNTER — Other Ambulatory Visit: Payer: Self-pay

## 2021-01-27 ENCOUNTER — Encounter: Payer: Self-pay | Admitting: Gastroenterology

## 2021-01-27 ENCOUNTER — Other Ambulatory Visit: Payer: Self-pay | Admitting: Gastroenterology

## 2021-01-27 VITALS — BP 116/82 | HR 88 | Ht 65.0 in | Wt 317.4 lb

## 2021-01-27 DIAGNOSIS — Z1211 Encounter for screening for malignant neoplasm of colon: Secondary | ICD-10-CM | POA: Diagnosis not present

## 2021-01-27 DIAGNOSIS — K5909 Other constipation: Secondary | ICD-10-CM

## 2021-01-27 MED ORDER — PANTOPRAZOLE SODIUM 40 MG PO TBEC: 40.0000 mg | DELAYED_RELEASE_TABLET | Freq: Every day | ORAL | 11 refills | Status: DC

## 2021-01-27 NOTE — Progress Notes (Signed)
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  She noticed change in her bowel habits in the past 2 years, she has a bowel movement once every few days after she uses herbal laxative and also drinks detox tea as needed.  Denies any rectal bleeding or blood in stool. 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  Denies any dysphagia odynophagia, vomiting, abdominal pain or melena.  Other relevant medical history includes morbid obesity, type 2 diabetes, hypertension and hyper lipidemia  No family history of GI malignancy or IBD.   Outpatient Encounter Medications as of 01/27/2021  Medication Sig  . acetaminophen (TYLENOL) 325 MG tablet Take 2 tablets (650 mg total) by mouth every 6 (six) hours as needed.  [name] albuterol (VENTOLIN HFA) 108 (90 Base) MCG/ACT inhaler Inhale 2 puffs into the lungs every 6 (six) hours as needed for wheezing or shortness of breath.  [name] atenolol (TENORMIN) 50 MG tablet TAKE 2 TABLETS BY MOUTH AT BEDTIME.  [name] atorvastatin (LIPITOR) 40 MG tablet   . Blood Glucose Monitoring Suppl (CONTOUR BLOOD GLUCOSE SYSTEM) w/Device KIT Use as directed to check sugars 1-2 times daily. Dx E11.9  . cyclobenzaprine (FLEXERIL) 10 MG tablet Take 1 tablet (10 mg total) by mouth at bedtime.  [name] desonide (DESOWEN) 0.05 % cream APPLY TO THE AFFECTED AREA(S) TWICE DAILY  . fluticasone (FLONASE) 50 MCG/ACT nasal spray Place 2 sprays into both nostrils daily.  . furosemide (LASIX) 20 MG tablet Take 1 tablet (20 mg total) by mouth daily.   [name] gabapentin (NEURONTIN) 300 MG capsule Take 1 capsule (300 mg total) by mouth at bedtime.  [name] glucose blood (CONTOUR TEST) test strip Use as instructed to test sugars 1-2 times daily. Dx. E11.9  . hydrochlorothiazide (HYDRODIURIL) 25 MG tablet Take 1 tablet (25 mg total) by mouth daily. Please schedule follow up appt with Dr. Birdie Riddle for further refills.  . hydroxychloroquine (PLAQUENIL) 200 MG tablet Take 1 tablet (200 mg total) by mouth 2 (two) times daily.  [name] losartan (COZAAR) 50 MG tablet TAKE 2 TABLETS BY MOUTH ONCE A DAY  . metFORMIN (GLUCOPHAGE-XR) 500 MG 24 hr tablet TAKE 1 TABLET BY MOUTH TWICE DAILY W/ FOOD  . omeprazole (PRILOSEC) 20 MG capsule   . ondansetron (ZOFRAN) 4 MG tablet TAKE 1 TABLET BY MOUTH EVERY 6 HOURS  . pantoprazole (PROTONIX) 40 MG tablet TAKE 1 TABLET BY MOUTH DAILY.  [name] potassium chloride SA (KLOR-CON) 20 MEQ tablet TAKE 2 TABLETS BY MOUTH DAILY  . Safety Lancets 28G MISC Use as directed to check sugars 1-2 times daily. Dx .E11.9  . SSS 10-5 10-5 % FOAM APPLY TO THE AFFECTED AREA ONCE DAILY  . traMADol-acetaminophen (ULTRACET) 37.5-325 MG tablet Take 1 tablet by mouth every 6 (six) hours as needed.  . traZODone (DESYREL) 50 MG tablet Take 0.5-1 tablets (25-50 mg total) by mouth at bedtime as needed for sleep.  . Vitamin D, Ergocalciferol, (DRISDOL) 1.25 MG (50000 UNIT) CAPS capsule Take 1 capsule (50,000 Units total) by mouth every 7 (  seven) days.   No facility-administered encounter medications on file as of 01/27/2021.    Allergies as of 01/27/2021 - Review Complete 12/16/2020  Allergen Reaction Noted  . Penicillins Other (See Comments) 12/16/2011    Past Medical History:  Diagnosis Date  . Anxiety   . Diabetes mellitus without complication ([place])   . Herpes   . Hypertension   . RLS (restless legs syndrome) 03/17/2017   Right leg    Past Surgical History:  Procedure Laterality Date  . ABDOMINAL HYSTERECTOMY    . DILATION AND CURETTAGE OF UTERUS    .  TUBAL LIGATION      Family History  Problem Relation Age of Onset  . Hypertension Mother   . Stroke Father   . Hypertension Paternal Grandmother   . Diabetes Mellitus II Paternal Grandmother   . Healthy Daughter   . Healthy Son     Social History   Socioeconomic History  . Marital status: Married    Spouse name: Not on file  . Number of children: Not on file  . Years of education: Not on file  . Highest education level: Not on file  Occupational History  . Not on file  Tobacco Use  . Smoking status: Former Smoker    Types: Cigarettes  . Smokeless tobacco: Never Used  . Tobacco comment: OCC  Vaping Use  . Vaping Use: Never used  Substance and Sexual Activity  . Alcohol use: Yes    Alcohol/week: 0.0 standard drinks    Comment: socially  . Drug use: No  . Sexual activity: Yes    Birth control/protection: Surgical  Other Topics Concern  . Not on file  Social History Narrative   Lives with husband and 2 children in a 2 story home.     Works as a Chartered certified accountant at [employer].     Highest level of education:  High school, in college now   Social Determinants of Health   Financial Resource Strain: Not on file  Food Insecurity: Not on file  Transportation Needs: Not on file  Physical Activity: Not on file  Stress: Not on file  Social Connections: Not on file  Intimate Partner Violence: Not on file      Review of systems: All other review of systems negative except as mentioned in the HPI.   Physical Exam: Vitals:   01/27/21 1351  BP: 116/82  Pulse: 88   Body mass index is 52.82 kg/m. Gen:      No acute distress HEENT:  sclera anicteric Abd:      soft, non-tender; no palpable masses, no distension Ext:    No edema Neuro: alert and oriented x 3 Psych: normal mood and affect  Data Reviewed:  Reviewed labs, radiology imaging, old records and pertinent past GI work up   Assessment and Plan/Recommendations:  50 year old very pleasant female with morbid  obesity, type 2 diabetes, hypertension, hyperlipidemia, autoimmune disease with change in bowel habits, worsening constipation  She is due for colorectal cancer screening, will need to exclude any neoplastic lesion or colon cancer. Schedule colonoscopy at [facility] endoscopy unit given BMI over 50 The risks and benefits as well as alternatives of endoscopic procedure(s) have been discussed and reviewed. All questions answered. The patient agrees to proceed.   Chronic idiopathic constipation: Start Linzess 290 mcg daily, plan to titrate the dose based on response Increase dietary fiber and fluid intake  If she prefers not on prescription laxative, advised patient to use prune  juice with 1 capful of MiraLAX daily and titrate based on response to have 1-2 soft bowel movements daily  Chronic GERD: Continue pantoprazole 40 mg daily and antireflux measures  Return in 6 months or sooner if needed   The patient was provided an opportunity to ask questions and all were answered. The patient agreed with the plan and demonstrated an understanding of the instructions.  Damaris Hippo , MD    CC: Midge Minium, MD

## 2021-01-27 NOTE — Patient Instructions (Signed)
We will give you samples of Linzess 226mcg, if these work for you we can send a new prescription to your pharmacy  It has been recommended to you by your physician that you have a(n) Colonoscopy completed at [facility] . Per your request, we did not schedule the procedure(s) today. Please contact our office at [phone] should you decide to have the procedure completed. You will be scheduled for a pre-visit and procedure at that time. ( We will contact you with a June hospital schedule when its available)   You can try to take Miralax and prune juice daily  We will send Pantoprazole refills to your pharmacy   [employer]'s Current Therapy 2021 (pp. 213-216). [place], [place]: Elsevier.">  Gastroesophageal Reflux Disease, Adult Gastroesophageal reflux (GER) happens when acid from the stomach flows up into the tube that connects the mouth and the stomach (esophagus). Normally, food travels down the esophagus and stays in the stomach to be digested. However, when a person has GER, food and stomach acid sometimes move back up into the esophagus. If this becomes a more serious problem, the person may be diagnosed with a disease called gastroesophageal reflux disease (GERD). GERD occurs when the reflux:  Happens often.  Causes frequent or severe symptoms.  Causes problems such as damage to the esophagus. When stomach acid comes in contact with the esophagus, the acid may cause inflammation in the esophagus. Over time, GERD may create small holes (ulcers) in the lining of the esophagus. What are the causes? This condition is caused by a problem with the muscle between the esophagus and the stomach (lower esophageal sphincter, or LES). Normally, the LES muscle closes after food passes through the esophagus to the stomach. When the LES is weakened or abnormal, it does not close properly, and that allows food and stomach acid to go back up into the esophagus. The LES can be weakened by certain  dietary substances, medicines, and medical conditions, including:  Tobacco use.  Pregnancy.  Having a hiatal hernia.  Alcohol use.  Certain foods and beverages, such as coffee, chocolate, onions, and peppermint. What increases the risk? You are more likely to develop this condition if you:  Have an increased body weight.  Have a connective tissue disorder.  Take NSAIDs, such as ibuprofen. What are the signs or symptoms? Symptoms of this condition include:  Heartburn.  Difficult or painful swallowing and the feeling of having a lump in the throat.  A bitter taste in the mouth.  Bad breath and having a large amount of saliva.  Having an upset or bloated stomach and belching.  Chest pain. Different conditions can cause chest pain. Make sure you see your health care provider if you experience chest pain.  Shortness of breath or wheezing.  Ongoing (chronic) cough or a nighttime cough.  Wearing away of tooth enamel.  Weight loss. How is this diagnosed? This condition may be diagnosed based on a medical history and a physical exam. To determine if you have mild or severe GERD, your health care provider may also monitor how you respond to treatment. You may also have tests, including:  A test to examine your stomach and esophagus with a small camera (endoscopy).  A test that measures the acidity level in your esophagus.  A test that measures how much pressure is on your esophagus.  A barium swallow or modified barium swallow test to show the shape, size, and functioning of your esophagus. How is this treated? Treatment for this  condition may vary depending on how severe your symptoms are. Your health care provider may recommend:  Changes to your diet.  Medicine.  Surgery. The goal of treatment is to help relieve your symptoms and to prevent complications. Follow these instructions at home: Eating and drinking  Follow a diet as recommended by your health care  provider. This may involve avoiding foods and drinks such as: ? Coffee and tea, with or without caffeine. ? Drinks that contain alcohol. ? Energy drinks and sports drinks. ? Carbonated drinks or sodas. ? Chocolate and cocoa. ? Peppermint and mint flavorings. ? Garlic and onions. ? Horseradish. ? Spicy and acidic foods, including peppers, chili powder, curry powder, vinegar, hot sauces, and barbecue sauce. ? Citrus fruit juices and citrus fruits, such as oranges, lemons, and limes. ? Tomato-based foods, such as red sauce, chili, salsa, and pizza with red sauce. ? Fried and fatty foods, such as donuts, french fries, potato chips, and high-fat dressings. ? High-fat meats, such as hot dogs and fatty cuts of red and white meats, such as rib eye steak, sausage, ham, and bacon. ? High-fat dairy items, such as whole milk, butter, and cream cheese.  Eat small, frequent meals instead of large meals.  Avoid drinking large amounts of liquid with your meals.  Avoid eating meals during the 2-3 hours before bedtime.  Avoid lying down right after you eat.  Do not exercise right after you eat.   Lifestyle  Do not use any products that contain nicotine or tobacco. These products include cigarettes, chewing tobacco, and vaping devices, such as e-cigarettes. If you need help quitting, ask your health care provider.  Try to reduce your stress by using methods such as yoga or meditation. If you need help reducing stress, ask your health care provider.  If you are overweight, reduce your weight to an amount that is healthy for you. Ask your health care provider for guidance about a safe weight loss goal.   General instructions  Pay attention to any changes in your symptoms.  Take over-the-counter and prescription medicines only as told by your health care provider. Do not take aspirin, ibuprofen, or other NSAIDs unless your health care provider told you to take these medicines.  Wear loose-fitting  clothing. Do not wear anything tight around your waist that causes pressure on your abdomen.  Raise (elevate) the head of your bed about 6 inches (15 cm). You can use a wedge to do this.  Avoid bending over if this makes your symptoms worse.  Keep all follow-up visits. This is important. Contact a health care provider if:  You have: ? New symptoms. ? Unexplained weight loss. ? Difficulty swallowing or it hurts to swallow. ? Wheezing or a persistent cough. ? A hoarse voice.  Your symptoms do not improve with treatment. Get help right away if:  You have sudden pain in your arms, neck, jaw, teeth, or back.  You suddenly feel sweaty, dizzy, or light-headed.  You have chest pain or shortness of breath.  You vomit and the vomit is green, yellow, or black, or it looks like blood or coffee grounds.  You faint.  You have stool that is red, bloody, or black.  You cannot swallow, drink, or eat. These symptoms may represent a serious problem that is an emergency. Do not wait to see if the symptoms will go away. Get medical help right away. Call your local emergency services (911 in the [place] Do not drive yourself to the hospital.  Summary  Gastroesophageal reflux happens when acid from the stomach flows up into the esophagus. GERD is a disease in which the reflux happens often, causes frequent or severe symptoms, or causes problems such as damage to the esophagus.  Treatment for this condition may vary depending on how severe your symptoms are. Your health care provider may recommend diet and lifestyle changes, medicine, or surgery.  Contact a health care provider if you have new or worsening symptoms.  Take over-the-counter and prescription medicines only as told by your health care provider. Do not take aspirin, ibuprofen, or other NSAIDs unless your health care provider told you to do so.  Keep all follow-up visits as told by your health care provider. This is important. This  information is not intended to replace advice given to you by your health care provider. Make sure you discuss any questions you have with your health care provider. Document Revised: 04/29/2020 Document Reviewed: 04/29/2020 Elsevier Patient Education  [place].  I appreciate the  opportunity to care for you  Thank You   Harl Bowie , MD

## 2021-01-31 ENCOUNTER — Ambulatory Visit: Payer: 59 | Admitting: Family Medicine

## 2021-02-01 ENCOUNTER — Other Ambulatory Visit (HOSPITAL_COMMUNITY): Payer: Self-pay

## 2021-02-01 MED FILL — Pantoprazole Sodium EC Tab 40 MG (Base Equiv): ORAL | 90 days supply | Qty: 90 | Fill #0 | Status: CN

## 2021-02-13 ENCOUNTER — Other Ambulatory Visit (HOSPITAL_COMMUNITY): Payer: Self-pay

## 2021-02-18 ENCOUNTER — Other Ambulatory Visit: Payer: Self-pay | Admitting: Family Medicine

## 2021-02-18 ENCOUNTER — Other Ambulatory Visit: Payer: Self-pay | Admitting: Physician Assistant

## 2021-02-18 DIAGNOSIS — M359 Systemic involvement of connective tissue, unspecified: Secondary | ICD-10-CM

## 2021-02-18 MED FILL — Desonide Cream 0.05%: CUTANEOUS | 30 days supply | Qty: 60 | Fill #0 | Status: CN

## 2021-02-18 MED FILL — Sulfacetamide Sodium w/ Sulfur Foam 10-5%: CUTANEOUS | 30 days supply | Qty: 60 | Fill #0 | Status: AC

## 2021-02-18 MED FILL — Atenolol Tab 50 MG: ORAL | 90 days supply | Qty: 180 | Fill #0 | Status: AC

## 2021-02-18 MED FILL — Trazodone HCl Tab 50 MG: ORAL | 30 days supply | Qty: 30 | Fill #0 | Status: AC

## 2021-02-18 MED FILL — Pantoprazole Sodium EC Tab 40 MG (Base Equiv): ORAL | 90 days supply | Qty: 90 | Fill #0 | Status: AC

## 2021-02-18 MED FILL — Metformin HCl Tab ER 24HR 500 MG: ORAL | 30 days supply | Qty: 60 | Fill #0 | Status: AC

## 2021-02-19 ENCOUNTER — Other Ambulatory Visit (HOSPITAL_COMMUNITY): Payer: Self-pay

## 2021-02-19 MED ORDER — HYDROXYCHLOROQUINE SULFATE 200 MG PO TABS
ORAL_TABLET | Freq: Two times a day (BID) | ORAL | 0 refills | Status: DC
  Filled 2021-02-19 – 2021-07-17 (×2): qty 180, 90d supply, fill #0

## 2021-02-19 MED ORDER — LOSARTAN POTASSIUM 50 MG PO TABS
ORAL_TABLET | Freq: Every day | ORAL | 1 refills | Status: DC
  Filled 2021-02-19: qty 60, 30d supply, fill #0
  Filled 2021-04-02: qty 60, 30d supply, fill #1

## 2021-02-19 MED ORDER — FUROSEMIDE 20 MG PO TABS
ORAL_TABLET | Freq: Every day | ORAL | 1 refills | Status: DC
  Filled 2021-02-19: qty 90, 90d supply, fill #0
  Filled 2021-08-26: qty 90, 90d supply, fill #1

## 2021-02-19 MED FILL — Desonide Cream 0.05%: CUTANEOUS | 30 days supply | Qty: 60 | Fill #0 | Status: CN

## 2021-02-19 NOTE — Telephone Encounter (Signed)
Last Visit: 12/16/2020 Next Visit:05/07/2021 Labs:  12/16/2020, Platelet count is borderline elevated. Glucose is 114. Rest of CMP WNL. UA normal.  ANA remains positive but low, nonspecific titer. dsDNA is negative. ESR remains elevated but is stable. C4 is elevated, C3 WNL. Ro antibody negative. RF negative. SPEP did not reveal any monoclonal proteins.   Labs are not consistent with a flare. Continue current dose of plaquenil.   Eye exam: 08/26/2020  Current Dose per office note 12/16/2020, Plaquenil 200 mg 1 tablet by mouth twice daily  GB:[id number] disease   Last Fill: 12/16/2020  Okay to refill Plaquenil?

## 2021-02-20 ENCOUNTER — Encounter: Payer: Self-pay | Admitting: Family Medicine

## 2021-02-20 ENCOUNTER — Other Ambulatory Visit (HOSPITAL_COMMUNITY): Payer: Self-pay

## 2021-02-21 ENCOUNTER — Other Ambulatory Visit (HOSPITAL_COMMUNITY): Payer: Self-pay

## 2021-02-21 ENCOUNTER — Telehealth (INDEPENDENT_AMBULATORY_CARE_PROVIDER_SITE_OTHER): Payer: 59 | Admitting: Family Medicine

## 2021-02-21 ENCOUNTER — Other Ambulatory Visit: Payer: Self-pay

## 2021-02-21 DIAGNOSIS — J069 Acute upper respiratory infection, unspecified: Secondary | ICD-10-CM

## 2021-02-21 MED ORDER — PREDNISONE 10 MG PO TABS
ORAL_TABLET | ORAL | 0 refills | Status: DC
Start: 2021-02-21 — End: 2021-04-21
  Filled 2021-02-21: qty 20, 8d supply, fill #0

## 2021-02-21 MED ORDER — DOXYCYCLINE HYCLATE 100 MG PO TABS
100.0000 mg | ORAL_TABLET | Freq: Two times a day (BID) | ORAL | 0 refills | Status: DC
Start: 2021-02-21 — End: 2021-04-21
  Filled 2021-02-21: qty 14, 7d supply, fill #0

## 2021-02-21 NOTE — Progress Notes (Signed)
Patient ID: Vanessa Reeves, female   DOB: 1971-09-08, 50 y.o.   MRN: [id number]   This visit type was conducted due to national recommendations for restrictions regarding the COVID-19 pandemic in an effort to limit this patient's exposure and mitigate transmission in our community.   Virtual Visit via Video Note  I connected with Vanessa Reeves on 02/21/21 at  2:30 PM EDT by a video enabled telemedicine application and verified that I am speaking with the correct person using two identifiers.  Location patient: home Location provider:work or home office Persons participating in the virtual visit: patient, provider  I discussed the limitations of evaluation and management by telemedicine and the availability of in person appointments. The patient expressed understanding and agreed to proceed.   HPI:  Vanessa Reeves relates onset around Saturday of cough, sneezing, fatigue.  Her husband was diagnosed with viral type illness last week and he has recovered.  She has had no fever.  She has had some mild dyspnea and possibly some mild wheezing.  She has inhaler with albuterol to use as needed at home.  She is concerned predominantly because of her history of lupus and she is on Plaquenil.  Her cough has been productive of yellow sputum over the past few days and she feels like she is not improving.  Allergic to penicillin.  Vital signs reported; blood pressure 159/94 and this is atypical for her.  Temperature 98.4, pulse oximetry 100%, respiratory rate 20, pulse 76   ROS: See pertinent positives and negatives per HPI.  Past Medical History:  Diagnosis Date  . Anxiety   . Depression   . Diabetes mellitus without complication ([place])   . Herpes   . Hyperlipidemia   . Hypertension   . RLS (restless legs syndrome) 03/17/2017   Right leg    Past Surgical History:  Procedure Laterality Date  . ABDOMINAL HYSTERECTOMY    . DILATION AND CURETTAGE OF UTERUS    . TUBAL LIGATION      Family History   Problem Relation Age of Onset  . Hypertension Mother   . Stroke Father   . Hypertension Paternal Grandmother   . Diabetes Mellitus II Paternal Grandmother   . Lupus Paternal Grandmother   . Healthy Daughter   . Healthy Son   . Colon cancer Neg Hx   . Esophageal cancer Neg Hx   . Pancreatic cancer Neg Hx   . Stomach cancer Neg Hx   . Liver disease Neg Hx     SOCIAL HX: Non-smoker   Current Outpatient Medications:  .  doxycycline (VIBRA-TABS) 100 MG tablet, Take 1 tablet (100 mg total) by mouth 2 (two) times daily., Disp: 14 tablet, Rfl: 0 .  hydroxychloroquine (PLAQUENIL) 200 MG tablet, TAKE 1 TABLET BY MOUTH 2 TIMES DAILY, Disp: 180 tablet, Rfl: 0 .  predniSONE (DELTASONE) 10 MG tablet, Taper as directed: Take 4 tablets daily for 2 days then decrease by 1 tablet every other day (4-4-3-3-2-2-1-1), Disp: 20 tablet, Rfl: 0 .  acetaminophen (TYLENOL) 325 MG tablet, Take 2 tablets (650 mg total) by mouth every 6 (six) hours as needed., Disp: 90 tablet, Rfl: 3 .  albuterol (VENTOLIN HFA) 108 (90 Base) MCG/ACT inhaler, INHALE 2 PUFFS INTO THE LUNGS EVERY 6 HOURS AS NEEDED FOR WHEEZING OR SHORTNESS OF BREATH., Disp: 18 g, Rfl: 0 .  atenolol (TENORMIN) 50 MG tablet, TAKE 2 TABLETS BY MOUTH AT BEDTIME, Disp: 180 tablet, Rfl: 1 .  atorvastatin (LIPITOR) 40 MG tablet, Take 40  mg by mouth daily., Disp: , Rfl:  .  Blood Glucose Monitoring Suppl (CONTOUR BLOOD GLUCOSE SYSTEM) w/Device KIT, Use as directed to check sugars 1-2 times daily. Dx E11.9, Disp: 1 kit, Rfl: 2 .  cyclobenzaprine (FLEXERIL) 10 MG tablet, TAKE 1 TABLET BY MOUTH EVERY NIGHT AT BEDTIME, Disp: 30 tablet, Rfl: 0 .  desonide (DESOWEN) 0.05 % cream, APPLY TO THE AFFECTED AREA(S) TWICE DAILY, Disp: 60 g, Rfl: 3 .  fluticasone (FLONASE) 50 MCG/ACT nasal spray, USE 2 SPRAYS IN EACH NOSTRIL ONCE A DAY, Disp: 16 g, Rfl: 6 .  furosemide (LASIX) 20 MG tablet, TAKE 1 TABLET BY MOUTH ONCE A DAY, Disp: 90 tablet, Rfl: 1 .  gabapentin  (NEURONTIN) 300 MG capsule, TAKE 1 CAPSULE BY MOUTH EVERY NIGHT AT BEDTIME, Disp: 30 capsule, Rfl: 3 .  glucose blood (CONTOUR TEST) test strip, Use as instructed to test sugars 1-2 times daily. Dx. E11.9, Disp: 100 each, Rfl: 12 .  hydrochlorothiazide (HYDRODIURIL) 25 MG tablet, TAKE 1 TABLET BY MOUTH ONCE A DAY, Disp: 90 tablet, Rfl: 1 .  losartan (COZAAR) 50 MG tablet, TAKE 2 TABLETS BY MOUTH ONCE A DAY, Disp: 60 tablet, Rfl: 1 .  metFORMIN (GLUCOPHAGE-XR) 500 MG 24 hr tablet, TAKE 1 TABLET BY MOUTH TWO TIMES DAILY WITH FOOD, Disp: 180 tablet, Rfl: 1 .  ondansetron (ZOFRAN) 4 MG tablet, TAKE 1 TABLET BY MOUTH EVERY 6 HOURS, Disp: 60 tablet, Rfl: 0 .  pantoprazole (PROTONIX) 40 MG tablet, TAKE 1 TABLET (40 MG TOTAL) BY MOUTH DAILY., Disp: 90 tablet, Rfl: 11 .  potassium chloride SA (KLOR-CON) 20 MEQ tablet, TAKE 2 TABLETS BY MOUTH DAILY, Disp: 120 tablet, Rfl: 6 .  Safety Lancets 28G MISC, Use as directed to check sugars 1-2 times daily. Dx .E11.9, Disp: 100 each, Rfl: 12 .  Sulfacetamide Sodium-Sulfur 10-5 % FOAM, APPLY TO THE AFFECTED AREA ONCE DAILY, Disp: 60 g, Rfl: 3 .  traMADol-acetaminophen (ULTRACET) 37.5-325 MG tablet, TAKE 1 TABLET BY MOUTH EVERY 6 HOURS AS NEEDED, Disp: 30 tablet, Rfl: 0 .  traZODone (DESYREL) 50 MG tablet, TAKE 1/2-1 TABLET BY MOUTH AT BEDTIME AS NEEDED FOR SLEEP, Disp: 30 tablet, Rfl: 3  EXAM:  VITALS per patient if applicable:  GENERAL: alert, oriented, appears well and in no acute distress  HEENT: atraumatic, conjunttiva clear, no obvious abnormalities on inspection of external nose and ears  NECK: normal movements of the head and neck  LUNGS: on inspection no signs of respiratory distress, breathing rate appears normal, no obvious gross SOB, gasping or wheezing  CV: no obvious cyanosis  MS: moves all visible extremities without noticeable abnormality  PSYCH/NEURO: pleasant and cooperative, no obvious depression or anxiety, speech and thought processing  grossly intact  ASSESSMENT AND PLAN:  Discussed the following assessment and plan:  Upper respiratory illness with cough.  Patient does have increased risk with her lupus like syndrome and Plaquenil therapy.  Nontoxic in appearance at this time.  -Agreed to prednisone taper and watch blood sugars closely.  She is aware this will likely exacerbate her blood sugar -Doxycycline 100 mg twice daily for 7 days -Follow-up for any increased shortness of breath, fever, or other concerns     I discussed the assessment and treatment plan with the patient. The patient was provided an opportunity to ask questions and all were answered. The patient agreed with the plan and demonstrated an understanding of the instructions.   The patient was advised to call back or seek an in-person  evaluation if the symptoms worsen or if the condition fails to improve as anticipated.     Carolann Littler, MD

## 2021-03-18 ENCOUNTER — Telehealth: Payer: Self-pay | Admitting: *Deleted

## 2021-03-18 NOTE — Telephone Encounter (Signed)
BMI over 50. Patient wanted a June appointment for a colonoscopy. June 21st is full.  Dr Treasa School this patient wanted a June appointment for a colon you are full looks like for 6/21 unless you could do her during your hospital week sometime   Thanks

## 2021-03-18 NOTE — Telephone Encounter (Signed)
Please check if any other provider has availability in June, I can request them to do the case if patient is ok with it. Thanks

## 2021-03-19 ENCOUNTER — Other Ambulatory Visit: Payer: Self-pay | Admitting: *Deleted

## 2021-03-19 DIAGNOSIS — Z6841 Body Mass Index (BMI) 40.0 and over, adult: Secondary | ICD-10-CM

## 2021-03-19 DIAGNOSIS — Z1211 Encounter for screening for malignant neoplasm of colon: Secondary | ICD-10-CM

## 2021-03-19 NOTE — Telephone Encounter (Signed)
I scheduled patient for 05/22/2021 at [place] for [facility] for a colonoscopy    L/M for patient that colonoscopy is scheduled at [facility] on 05/22/2021 at [place], Pre op appointment is scheduled on 05/14/2021 at 2:30pm

## 2021-03-24 ENCOUNTER — Ambulatory Visit: Payer: 59 | Admitting: Family Medicine

## 2021-04-02 ENCOUNTER — Other Ambulatory Visit (HOSPITAL_COMMUNITY): Payer: Self-pay

## 2021-04-08 ENCOUNTER — Other Ambulatory Visit (HOSPITAL_COMMUNITY): Payer: Self-pay

## 2021-04-08 ENCOUNTER — Other Ambulatory Visit: Payer: Self-pay | Admitting: Specialist

## 2021-04-08 ENCOUNTER — Ambulatory Visit: Payer: 59 | Admitting: Family Medicine

## 2021-04-08 ENCOUNTER — Other Ambulatory Visit: Payer: Self-pay | Admitting: Family Medicine

## 2021-04-08 DIAGNOSIS — M5136 Other intervertebral disc degeneration, lumbar region: Secondary | ICD-10-CM

## 2021-04-08 MED ORDER — HYDROCHLOROTHIAZIDE 25 MG PO TABS
ORAL_TABLET | Freq: Every day | ORAL | 0 refills | Status: DC
  Filled 2021-04-08: qty 90, 90d supply, fill #0

## 2021-04-08 MED FILL — Sulfacetamide Sodium w/ Sulfur Foam 10-5%: CUTANEOUS | 30 days supply | Qty: 60 | Fill #1 | Status: CN

## 2021-04-09 ENCOUNTER — Other Ambulatory Visit (HOSPITAL_COMMUNITY): Payer: Self-pay

## 2021-04-09 MED ORDER — CYCLOBENZAPRINE HCL 10 MG PO TABS
ORAL_TABLET | Freq: Every day | ORAL | 0 refills | Status: DC
  Filled 2021-04-09: qty 30, 30d supply, fill #0

## 2021-04-09 MED FILL — Fluticasone Propionate Nasal Susp 50 MCG/ACT: NASAL | 30 days supply | Qty: 16 | Fill #0 | Status: AC

## 2021-04-10 ENCOUNTER — Other Ambulatory Visit (HOSPITAL_COMMUNITY): Payer: Self-pay

## 2021-04-11 ENCOUNTER — Other Ambulatory Visit (HOSPITAL_COMMUNITY): Payer: Self-pay

## 2021-04-11 MED ORDER — POTASSIUM CHLORIDE CRYS ER 20 MEQ PO TBCR
20.0000 meq | EXTENDED_RELEASE_TABLET | Freq: Every day | ORAL | 5 refills | Status: DC
  Filled 2021-04-11: qty 120, 60d supply, fill #0

## 2021-04-13 DIAGNOSIS — R5381 Other malaise: Secondary | ICD-10-CM | POA: Diagnosis not present

## 2021-04-14 ENCOUNTER — Ambulatory Visit: Payer: 59 | Admitting: Family Medicine

## 2021-04-14 ENCOUNTER — Other Ambulatory Visit (HOSPITAL_COMMUNITY): Payer: Self-pay

## 2021-04-15 ENCOUNTER — Other Ambulatory Visit (HOSPITAL_COMMUNITY): Payer: Self-pay

## 2021-04-16 ENCOUNTER — Other Ambulatory Visit: Payer: Self-pay | Admitting: Family Medicine

## 2021-04-16 ENCOUNTER — Other Ambulatory Visit (HOSPITAL_COMMUNITY): Payer: Self-pay

## 2021-04-16 NOTE — Telephone Encounter (Signed)
this med is unavailable,. please change to something else

## 2021-04-16 NOTE — Telephone Encounter (Signed)
Does it come in another form?  Lotion or cream (as opposed to foam)?

## 2021-04-17 ENCOUNTER — Other Ambulatory Visit (HOSPITAL_COMMUNITY): Payer: Self-pay

## 2021-04-17 MED FILL — Sulfacetamide Sodium w/ Sulfur Foam 10-5%: CUTANEOUS | 30 days supply | Qty: 60 | Fill #1 | Status: AC

## 2021-04-18 ENCOUNTER — Other Ambulatory Visit (HOSPITAL_COMMUNITY): Payer: Self-pay

## 2021-04-21 ENCOUNTER — Encounter: Payer: Self-pay | Admitting: Family Medicine

## 2021-04-21 ENCOUNTER — Other Ambulatory Visit (HOSPITAL_COMMUNITY): Payer: Self-pay

## 2021-04-21 ENCOUNTER — Ambulatory Visit: Payer: 59 | Admitting: Family Medicine

## 2021-04-21 ENCOUNTER — Other Ambulatory Visit: Payer: Self-pay

## 2021-04-21 VITALS — BP 112/80 | HR 63 | Temp 98.6°F | Resp 20 | Ht 65.0 in | Wt 316.8 lb

## 2021-04-21 DIAGNOSIS — I10 Essential (primary) hypertension: Secondary | ICD-10-CM

## 2021-04-21 DIAGNOSIS — F4321 Adjustment disorder with depressed mood: Secondary | ICD-10-CM

## 2021-04-21 DIAGNOSIS — E1169 Type 2 diabetes mellitus with other specified complication: Secondary | ICD-10-CM | POA: Diagnosis not present

## 2021-04-21 DIAGNOSIS — E1142 Type 2 diabetes mellitus with diabetic polyneuropathy: Secondary | ICD-10-CM

## 2021-04-21 DIAGNOSIS — E785 Hyperlipidemia, unspecified: Secondary | ICD-10-CM | POA: Diagnosis not present

## 2021-04-21 DIAGNOSIS — E0842 Diabetes mellitus due to underlying condition with diabetic polyneuropathy: Secondary | ICD-10-CM

## 2021-04-21 LAB — CBC WITH DIFFERENTIAL/PLATELET
Basophils Absolute: 0 10*3/uL (ref 0.0–0.1)
Basophils Relative: 0.7 % (ref 0.0–3.0)
Eosinophils Absolute: 0.2 10*3/uL (ref 0.0–0.7)
Eosinophils Relative: 3.8 % (ref 0.0–5.0)
HCT: 38.3 % (ref 36.0–46.0)
Hemoglobin: 12.6 g/dL (ref 12.0–15.0)
Lymphocytes Relative: 47.8 % — ABNORMAL HIGH (ref 12.0–46.0)
Lymphs Abs: 2.4 10*3/uL (ref 0.7–4.0)
MCHC: 32.8 g/dL (ref 30.0–36.0)
MCV: 85.7 fl (ref 78.0–100.0)
Monocytes Absolute: 0.3 10*3/uL (ref 0.1–1.0)
Monocytes Relative: 6.6 % (ref 3.0–12.0)
Neutro Abs: 2.1 10*3/uL (ref 1.4–7.7)
Neutrophils Relative %: 41.1 % — ABNORMAL LOW (ref 43.0–77.0)
Platelets: 380 10*3/uL (ref 150.0–400.0)
RBC: 4.47 Mil/uL (ref 3.87–5.11)
RDW: 16 % — ABNORMAL HIGH (ref 11.5–15.5)
WBC: 5.1 10*3/uL (ref 4.0–10.5)

## 2021-04-21 LAB — HEMOGLOBIN A1C: Hgb A1c MFr Bld: 6.5 % (ref 4.6–6.5)

## 2021-04-21 LAB — HEPATIC FUNCTION PANEL
ALT: 12 U/L (ref 0–35)
AST: 13 U/L (ref 0–37)
Albumin: 4.1 g/dL (ref 3.5–5.2)
Alkaline Phosphatase: 86 U/L (ref 39–117)
Bilirubin, Direct: 0.1 mg/dL (ref 0.0–0.3)
Total Bilirubin: 0.4 mg/dL (ref 0.2–1.2)
Total Protein: 7.6 g/dL (ref 6.0–8.3)

## 2021-04-21 LAB — BASIC METABOLIC PANEL
BUN: 11 mg/dL (ref 6–23)
CO2: 27 mEq/L (ref 19–32)
Calcium: 9.6 mg/dL (ref 8.4–10.5)
Chloride: 102 mEq/L (ref 96–112)
Creatinine, Ser: 0.83 mg/dL (ref 0.40–1.20)
GFR: 83.32 mL/min (ref >60.00)
Glucose, Bld: 89 mg/dL (ref 70–99)
Potassium: 4 mEq/L (ref 3.5–5.1)
Sodium: 139 mEq/L (ref 135–145)

## 2021-04-21 LAB — LIPID PANEL
Cholesterol: 156 mg/dL (ref 0–200)
HDL: 55.1 mg/dL (ref >39.00)
LDL Cholesterol: 86 mg/dL (ref 0–99)
NonHDL: 101.23
Total CHOL/HDL Ratio: 3
Triglycerides: 74 mg/dL (ref 0.0–149.0)
VLDL: 14.8 mg/dL (ref 0.0–40.0)

## 2021-04-21 LAB — TSH: TSH: 1.97 u[IU]/mL (ref 0.35–4.50)

## 2021-04-21 MED ORDER — ALPRAZOLAM 0.5 MG PO TABS
0.5000 mg | ORAL_TABLET | Freq: Two times a day (BID) | ORAL | 1 refills | Status: DC | PRN
  Filled 2021-04-21: qty 30, 15d supply, fill #0
  Filled 2021-07-17: qty 30, 15d supply, fill #1

## 2021-04-21 MED ORDER — ESCITALOPRAM OXALATE 10 MG PO TABS
10.0000 mg | ORAL_TABLET | Freq: Every day | ORAL | 3 refills | Status: DC
  Filled 2021-04-21: qty 30, 30d supply, fill #0
  Filled 2021-07-17: qty 30, 30d supply, fill #1
  Filled 2021-09-28: qty 30, 30d supply, fill #2
  Filled 2021-10-27: qty 30, 30d supply, fill #3

## 2021-04-21 NOTE — Patient Instructions (Signed)
Follow up in 1 month to recheck mood We'll notify you of your lab results and make any changes if needed START the Lexapro once daily USE the Alprazolam as needed for panicked or overwhelmed moments DECREASE the Atenolol to 1 tab nightly Call with any questions or concerns Hang in there!!!  Take Care of YOU!!!

## 2021-04-21 NOTE — Progress Notes (Signed)
   Subjective:    Patient ID: Vanessa Reeves, female    DOB: 05-09-1971, 50 y.o.   MRN: [id number]  HPI DM- last A1C 6.5%.  not currently on Metformin.  UTD on eye exam.  Due for foot exam.  On ARB for renal protection.  Did have symptomatic low while in the hospital w/ mom.  HTN- chronic problem, on Atenolol 100mg  QHS, Furosemide 20mg  daily, HCTZ 25mg  daily, Losartan 100mg  daily w/ good control.  No CP (other than stress), SOB, HAs.  Did have low BP episode and pt felt that she was going to pass out when she was visiting mom in hospital.  Hyperlipidemia- chronic problem, on Lipitor 40mg  daily.  No abd pain, N/V.  Obesity- BMI is 52.72  Has been eating on the run, no regular exercise.  Only eating once daily.  Depression- pt's PHQ=18 today.  Not currently on medication.  Currently going back and forth to [facility] b/c mom had COVID and developed a blood clot in her lung.  Pt had to make decision to remove mom from ventilator and stop tube feeds.  Pt plans to use Hospice for counseling.  Pt is open to idea of medication.   Review of Systems For ROS see HPI   This visit occurred during the SARS-CoV-2 public health emergency.  Safety protocols were in place, including screening questions prior to the visit, additional usage of staff PPE, and extensive cleaning of exam room while observing appropriate contact time as indicated for disinfecting solutions.      Objective:   Physical Exam Vitals reviewed.  Constitutional:      General: She is not in acute distress.    Appearance: Normal appearance. She is well-developed. She is obese. She is not ill-appearing.  HENT:     Head: Normocephalic and atraumatic.  Eyes:     Conjunctiva/sclera: Conjunctivae normal.     Pupils: Pupils are equal, round, and reactive to light.  Neck:     Thyroid: No thyromegaly.  Cardiovascular:     Rate and Rhythm: Normal rate and regular rhythm.     Pulses: Normal pulses.     Heart sounds: Normal heart sounds.  No murmur heard. Pulmonary:     Effort: Pulmonary effort is normal. No respiratory distress.     Breath sounds: Normal breath sounds.  Abdominal:     General: There is no distension.     Palpations: Abdomen is soft.     Tenderness: There is no abdominal tenderness.  Musculoskeletal:     Cervical back: Normal range of motion and neck supple.     Right lower leg: No edema.     Left lower leg: No edema.  Lymphadenopathy:     Cervical: No cervical adenopathy.  Skin:    General: Skin is warm and dry.  Neurological:     Mental Status: She is alert and oriented to person, place, and time.  Psychiatric:        Behavior: Behavior normal.          Assessment & Plan:

## 2021-04-22 ENCOUNTER — Encounter: Payer: Self-pay | Admitting: Family Medicine

## 2021-04-22 ENCOUNTER — Other Ambulatory Visit (HOSPITAL_COMMUNITY): Payer: Self-pay

## 2021-04-22 NOTE — Assessment & Plan Note (Signed)
Deteriorated.  Pt had to stop tube feeds and turn off the vent for mom over the weekend.  They are just waiting for her to pass.  She is understandably devastated and having a hard time coping.  She plans to use Hospice for grief counseling and we will restart Lexapro today.  She will use Alprazolam as needed for high stress/panicked moments.  Will follow closely.

## 2021-04-22 NOTE — Assessment & Plan Note (Signed)
Chronic problem.  Tolerating statin w/o difficulty.  Check labs.  Adjust meds prn  

## 2021-04-22 NOTE — Assessment & Plan Note (Signed)
Chronic problem.  Pt's BP is well controlled but she did have some episodes of hypotension while visiting mom in the hospital.  Will decrease Atenolol to 100mg  nightly and continue to monitor.

## 2021-04-22 NOTE — Telephone Encounter (Signed)
Duplicate message. 

## 2021-04-22 NOTE — Assessment & Plan Note (Signed)
Chronic problem.  Stopped her Metformin due to symptomatic lows b/c she is not eating regularly.  UTD on eye exam.  Foot exam done today.  Check labs.  Adjust meds prn

## 2021-04-22 NOTE — Assessment & Plan Note (Signed)
Ongoing issue for pt.  BMI is 52.72  She has recently been eating 1x/day and eating on the run with all of her family stressors.  No regular exercise.  She knows that this is a problem but the biggest issue right now is her emotional wellbeing.  Will follow.

## 2021-04-23 NOTE — Progress Notes (Signed)
Office Visit Note  Patient: Vanessa Reeves             Date of Birth: 03/04/1971           MRN: [id number]             PCP: Midge Minium, MD Referring: Midge Minium, MD Visit Date: 05/07/2021 Occupation: @GUAROCC @  Subjective:  Medication management.   History of Present Illness: Vanessa Reeves is a 50 y.o. female with a history of autoimmune disease.  She has been under a lot of stress as she lost her mother last week.  She states that she has had some problems with dry mouth and dry eyes and some sores in her mouth.  She gets rash when she is under stress and has been noticing Raynauds some rash on her extremities.  There is no history of Raynaud's phenomenon, photosensitivity or lymphadenopathy.  She continues to have discomfort in her hands and cramps in her hands.  She also has lower back pain for which she has been followed by Dr. Louanne Skye.  He prescribed gabapentin, muscle relaxer and tramadol for the pain management.  She takes it on a as needed basis for the flares.  She has been taking hydroxychloroquine on a regular basis.  She sometimes get nausea with the hydroxychloroquine.  She requested a refill on Zofran today.  Activities of Daily Living:  Patient reports morning stiffness for 5-10 minutes.   Patient Reports nocturnal pain.  Difficulty dressing/grooming: Denies Difficulty climbing stairs: Reports Difficulty getting out of chair: Reports Difficulty using hands for taps, buttons, cutlery, and/or writing: Reports  Review of Systems  Constitutional:  Positive for fatigue.  HENT:  Positive for mouth dryness. Negative for mouth sores and nose dryness.   Eyes:  Positive for dryness. Negative for pain and itching.  Respiratory:  Positive for shortness of breath.   Cardiovascular:  Positive for chest pain and palpitations.  Gastrointestinal:  Positive for constipation. Negative for blood in stool and diarrhea.  Endocrine: Negative for increased urination.   Genitourinary:  Negative for difficulty urinating.  Musculoskeletal:  Positive for joint pain, joint pain and morning stiffness. Negative for joint swelling, myalgias, muscle tenderness and myalgias.  Skin:  Positive for rash. Negative for color change and sensitivity to sunlight.  Allergic/Immunologic: Negative for susceptible to infections.  Neurological:  Positive for headaches and memory loss. Negative for dizziness and numbness.  Hematological:  Negative for bruising/bleeding tendency.  Psychiatric/Behavioral:  Negative for confusion. The patient is nervous/anxious.    PMFS History:  Patient Active Problem List   Diagnosis Date Noted   Adjustment disorder with depressed mood 10/24/2020   Hearing loss of right ear 09/04/2020   Hyperlipidemia associated with type 2 diabetes mellitus ([place]) 02/21/2020   Insomnia 02/21/2020   Vitamin D deficiency 02/21/2020   Degenerative disc disease, lumbar 02/21/2020   Diabetic polyneuropathy associated with diabetes mellitus due to underlying condition ([place]) 06/14/2019   Visit for preventive health examination 04/11/2018   Breast cancer screening 04/11/2018   Memory changes 11/23/2017   RLS (restless legs syndrome) 03/17/2017   ADHD (attention deficit hyperactivity disorder), 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   Reactive airway disease 08/21/2016   Gastroesophageal reflux disease without esophagitis 12/20/2015   Possible exposure to STD 06/07/2015   Physical exam 12/14/2014   Breast mass, left 02/16/2014   Depression with anxiety 07/17/2013   Essential hypertension 07/17/2013   Eustachian tube dysfunction 07/17/2013   Severe obesity (BMI >= 40) ([place]) 07/17/2013  Past Medical History:  Diagnosis Date   Anxiety    Depression    Diabetes mellitus without complication (HCC)    Herpes    Hyperlipidemia    Hypertension    RLS (restless legs syndrome) 03/17/2017   Right leg    Family History  Problem Relation Age of Onset   Hypertension Mother     Stroke Father    Hypertension Paternal Grandmother    Diabetes Mellitus II Paternal Grandmother    Lupus Paternal Grandmother    Healthy Daughter    Healthy Son    Colon cancer Neg Hx    Esophageal cancer Neg Hx    Pancreatic cancer Neg Hx    Stomach cancer Neg Hx    Liver disease Neg Hx    Past Surgical History:  Procedure Laterality Date   ABDOMINAL HYSTERECTOMY     DILATION AND CURETTAGE OF UTERUS     TUBAL LIGATION     Social History   Social History Narrative   Lives with husband and 2 children in a 2 story home.     Works as a Chartered certified accountant at [employer].     Highest level of education:  High school, in college now   Immunization History  Administered Date(s) Administered   Influenza,inj,Quad PF,6+ Mos 07/17/2013, 07/27/2014, 07/26/2015, 08/21/2016, 07/06/2017, 07/29/2018, 07/03/2019   Influenza-Unspecified 08/20/2020   MMR 03/08/2017, 04/05/2017   Moderna Sars-Covid-2 Vaccination 06/06/2020, 07/04/2020   PPD Test 07/26/2015, 03/07/2018, 05/23/2018   Pneumococcal Polysaccharide-23 01/16/2015   Tdap 10/14/2015     Objective: Vital Signs: BP (!) 159/82 (BP Location: Left Wrist, Patient Position: Sitting, Cuff Size: Normal)   Pulse 60   Resp 17   Ht $R'5\' 5"'PN$  (1.651 m)   Wt (!) 316 lb (143.3 kg)   LMP 12/04/2011   BMI 52.59 kg/m    Physical Exam Vitals and nursing note reviewed.  Constitutional:      Appearance: She is well-developed.  HENT:     Head: Normocephalic and atraumatic.  Eyes:     Conjunctiva/sclera: Conjunctivae normal.  Cardiovascular:     Rate and Rhythm: Normal rate and regular rhythm.     Heart sounds: Normal heart sounds.  Pulmonary:     Effort: Pulmonary effort is normal.     Breath sounds: Normal breath sounds.  Abdominal:     General: Bowel sounds are normal.     Palpations: Abdomen is soft.  Musculoskeletal:     Cervical back: Normal range of motion.  Lymphadenopathy:     Cervical: No cervical adenopathy.  Skin:    General: Skin  is warm and dry.     Capillary Refill: Capillary refill takes less than 2 seconds.  Neurological:     Mental Status: She is alert and oriented to person, place, and time.  Psychiatric:        Behavior: Behavior normal.     Musculoskeletal Exam: C-spine was in good range of motion.  She had painful limited range of motion of her lumbar spine.  Shoulder joints, elbow joints, wrist joints, MCPs PIPs and DIPs with good range of motion with no synovitis.  Hip joints, knee joints, ankles, MTPs and PIPs with good range of motion with no synovitis.  CDAI Exam: CDAI Score: -- Patient Global: --; Provider Global: -- Swollen: --; Tender: -- Joint Exam 05/07/2021   No joint exam has been documented for this visit   There is currently no information documented on the homunculus. Go to the Rheumatology activity  and complete the homunculus joint exam.  Investigation: No additional findings.  Imaging: No results found.  Recent Labs: Lab Results  Component Value Date   WBC 5.1 04/21/2021   HGB 12.6 04/21/2021   PLT 380.0 04/21/2021   NA 139 04/21/2021   K 4.0 04/21/2021   CL 102 04/21/2021   CO2 27 04/21/2021   GLUCOSE 89 04/21/2021   BUN 11 04/21/2021   CREATININE 0.83 04/21/2021   BILITOT 0.4 04/21/2021   ALKPHOS 86 04/21/2021   AST 13 04/21/2021   ALT 12 04/21/2021   PROT 7.6 04/21/2021   ALBUMIN 4.1 04/21/2021   CALCIUM 9.6 04/21/2021   GFRAA 74 12/16/2020   QFTBGOLDPLUS NEGATIVE 07/03/2019    Speciality Comments: PLQ Eye Exam: 08/26/2020 WNL @ [place] Follow up 05/2021  Procedures:  No procedures performed Allergies: Penicillins   Assessment / Plan:     Visit Diagnoses: Autoimmune disease ([place]) - ANA 1: 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  She states  that she had a oral ulcer.  She continues to have sicca symptoms.  She also complains of discomfort in her hands.  No synovitis was noted.  She gives history of infrequent rash on her extremities.  Use of sunscreen was discussed.  She had recent labs by her PCP.  There is positive history of blood clots in her mother and her maternal grandmother.  I will check anti phospholipid antibodies.  She will come back in 3 months and have labs prior to her next appointment.  Plan: Urinalysis, Routine w reflex microscopic, Anti-DNA antibody, double-stranded, C3 and C4, Sedimentation rate, Urinalysis, Routine w reflex microscopic, Beta-2 glycoprotein antibodies, Cardiolipin antibodies, IgG, IgM, IgA, Lupus Anticoagulant Eval w/Reflex  High risk medication use - Plaquenil 200 mg 1 tablet by mouth twice daily.  She gets nausea with Plaquenil use.  She takes Zofran intermittently.  Prescription refill was sent today.  PLQ Eye Exam: 08/26/2020 - Plan: CBC with Differential/Platelet, COMPLETE METABOLIC PANEL WITH GFR  Pain in both hands-she complains of a stiffness in her hands.  No synovitis was noted. The discomfort could be coming from underlying osteoarthritis.  A handout on hand exercises was given.  Rash-she gets infrequent rash when exposed to the sun.  Use of sunscreen more than 50 SPF was discussed.  Other fatigue-she continues to have some fatigue.  Chronic SI joint pain-she continues to have  SI joint pain.  DDD (degenerative disc disease), lumbar - CT of lumbar spine on 10/07/20 ordered by Dr. Louanne Skye.  Lower back pain persists.  She states she takes gabapentin, muscle relaxer and tramadol on as needed basis.  Facet arthropathy - Noted on x-ray and CT of lumbar spine.   Vitamin D deficiency-her last vitamin D level was normal.  She is on vitamin D supplement.  Essential hypertension-her blood pressure is elevated today.  I have advised her to monitor blood pressure closely and follow-up with her PCP.   Increased risk of heart disease with autoimmune disease was discussed.  Dietary modifications and guidelines regarding exercise were placed in the AVS.  Diabetic polyneuropathy associated with diabetes mellitus due to underlying condition (HCC)-her last hemoglobin A 1C was 6.5.  Gastroesophageal reflux disease without esophagitis  ADHD (attention deficit hyperactivity disorder), inattentive type  RLS (restless legs syndrome)  Depression with anxiety-she under a lot  of stress due to her mother's loss.  Moderate persistent reactive airway disease with acute exacerbation  Former smoker  Orders: Orders Placed This Encounter  Procedures   CBC with Differential/Platelet   COMPLETE METABOLIC PANEL WITH GFR   Urinalysis, Routine w reflex microscopic   Anti-DNA antibody, double-stranded   C3 and C4   Sedimentation rate   Urinalysis, Routine w reflex microscopic   Beta-2 glycoprotein antibodies   Cardiolipin antibodies, IgG, IgM, IgA   Lupus Anticoagulant Eval w/Reflex    Meds ordered this encounter  Medications   ondansetron (ZOFRAN) 4 MG tablet    Sig: TAKE 1 TABLET BY MOUTH EVERY 6 HOURS    Dispense:  60 tablet    Refill:  0      Follow-Up Instructions: Return in about 3 months (around 08/07/2021) for Autoimmune disease.   Bo Merino, MD  Note - This record has been created using Editor, commissioning.  Chart creation errors have been sought, but may not always  have been located. Such creation errors do not reflect on  the standard of medical care.

## 2021-04-25 ENCOUNTER — Ambulatory Visit: Payer: 59 | Admitting: Specialist

## 2021-04-30 ENCOUNTER — Encounter: Payer: Self-pay | Admitting: *Deleted

## 2021-04-30 ENCOUNTER — Encounter: Payer: Self-pay | Admitting: Family Medicine

## 2021-05-07 ENCOUNTER — Ambulatory Visit: Payer: 59 | Admitting: Rheumatology

## 2021-05-07 ENCOUNTER — Other Ambulatory Visit (HOSPITAL_COMMUNITY): Payer: Self-pay

## 2021-05-07 ENCOUNTER — Other Ambulatory Visit: Payer: Self-pay

## 2021-05-07 ENCOUNTER — Encounter: Payer: Self-pay | Admitting: Rheumatology

## 2021-05-07 VITALS — BP 159/82 | HR 60 | Resp 17 | Ht 65.0 in | Wt 316.0 lb

## 2021-05-07 DIAGNOSIS — M79642 Pain in left hand: Secondary | ICD-10-CM

## 2021-05-07 DIAGNOSIS — M533 Sacrococcygeal disorders, not elsewhere classified: Secondary | ICD-10-CM

## 2021-05-07 DIAGNOSIS — M5136 Other intervertebral disc degeneration, lumbar region: Secondary | ICD-10-CM | POA: Diagnosis not present

## 2021-05-07 DIAGNOSIS — R5383 Other fatigue: Secondary | ICD-10-CM

## 2021-05-07 DIAGNOSIS — M47819 Spondylosis without myelopathy or radiculopathy, site unspecified: Secondary | ICD-10-CM | POA: Diagnosis not present

## 2021-05-07 DIAGNOSIS — M359 Systemic involvement of connective tissue, unspecified: Secondary | ICD-10-CM

## 2021-05-07 DIAGNOSIS — M79641 Pain in right hand: Secondary | ICD-10-CM

## 2021-05-07 DIAGNOSIS — E0842 Diabetes mellitus due to underlying condition with diabetic polyneuropathy: Secondary | ICD-10-CM

## 2021-05-07 DIAGNOSIS — I10 Essential (primary) hypertension: Secondary | ICD-10-CM

## 2021-05-07 DIAGNOSIS — R21 Rash and other nonspecific skin eruption: Secondary | ICD-10-CM | POA: Diagnosis not present

## 2021-05-07 DIAGNOSIS — F418 Other specified anxiety disorders: Secondary | ICD-10-CM

## 2021-05-07 DIAGNOSIS — F900 Attention-deficit hyperactivity disorder, predominantly inattentive type: Secondary | ICD-10-CM

## 2021-05-07 DIAGNOSIS — E559 Vitamin D deficiency, unspecified: Secondary | ICD-10-CM

## 2021-05-07 DIAGNOSIS — J4541 Moderate persistent asthma with (acute) exacerbation: Secondary | ICD-10-CM

## 2021-05-07 DIAGNOSIS — Z87891 Personal history of nicotine dependence: Secondary | ICD-10-CM

## 2021-05-07 DIAGNOSIS — G2581 Restless legs syndrome: Secondary | ICD-10-CM

## 2021-05-07 DIAGNOSIS — K219 Gastro-esophageal reflux disease without esophagitis: Secondary | ICD-10-CM

## 2021-05-07 DIAGNOSIS — Z79899 Other long term (current) drug therapy: Secondary | ICD-10-CM | POA: Diagnosis not present

## 2021-05-07 DIAGNOSIS — M51369 Other intervertebral disc degeneration, lumbar region without mention of lumbar back pain or lower extremity pain: Secondary | ICD-10-CM

## 2021-05-07 DIAGNOSIS — G8929 Other chronic pain: Secondary | ICD-10-CM

## 2021-05-07 MED ORDER — ONDANSETRON HCL 4 MG PO TABS
ORAL_TABLET | Freq: Four times a day (QID) | ORAL | 0 refills | Status: DC
  Filled 2021-05-07: qty 60, 15d supply, fill #0

## 2021-05-07 NOTE — Patient Instructions (Signed)
Vaccines You are taking a medication(s) that can suppress your immune system.  The following immunizations are recommended: Flu annually Covid-19  Td/Tdap (tetanus, diphtheria, pertussis) every 10 years Pneumonia (Prevnar 15 then Pneumovax 23 at least 1 year apart.  Alternatively, can take Prevnar 20 without needing additional dose) Shingrix (after age 49): 2 doses from 4 weeks to 6 months apart  Please check with your PCP to make sure you are up to date.   Heart Disease Prevention   Your inflammatory disease increases your risk of heart disease which includes heart attack, stroke, atrial fibrillation (irregular heartbeats), high blood pressure, heart failure and atherosclerosis (plaque in the arteries).  It is important to reduce your risk by:   Keep blood pressure, cholesterol, and blood sugar at healthy levels   Smoking Cessation   Maintain a healthy weight  BMI 20-25   Eat a healthy diet  Plenty of fresh fruit, vegetables, and whole grains  Limit saturated fats, foods high in sodium, and added sugars  DASH and Mediterranean diet   Increase physical activity  Recommend moderate physically activity for 150 minutes per week/ 30 minutes a day for five days a week These can be broken up into three separate ten-minute sessions during the day.   Reduce Stress  Meditation, slow breathing exercises, yoga, coloring books  Dental visits twice a year   Hand Exercises Hand exercises can be helpful for almost anyone. These exercises can strengthen the hands, improve flexibility and movement, and increase blood flow to the hands. These results can make work and daily tasks easier. Hand exercises can be especially helpful for people who have joint pain from arthritis or have nerve damage from overuse (carpal tunnel syndrome). These exercises can also help people who have injured a hand. Exercises Most of these hand exercises are gentle stretching and motion exercises. It is usually safe to do  them often throughout the day. Warming up your hands before exercise may help to reduce stiffness. You can do this with gentle massage orby placing your hands in warm water for 10-15 minutes. It is normal to feel some stretching, pulling, tightness, or mild discomfort as you begin new exercises. This will gradually improve. Stop an exercise right away if you feel sudden, severe pain or your pain gets worse. Ask your healthcare provider which exercises are best for you. Knuckle bend or "claw" fist Stand or sit with your arm, hand, and all five fingers pointed straight up. Make sure to keep your wrist straight during the exercise. Gently bend your fingers down toward your palm until the tips of your fingers are touching the top of your palm. Keep your big knuckle straight and just bend the small knuckles in your fingers. Hold this position for __________ seconds. Straighten (extend) your fingers back to the starting position. Repeat this exercise 5-10 times with each hand. Full finger fist Stand or sit with your arm, hand, and all five fingers pointed straight up. Make sure to keep your wrist straight during the exercise. Gently bend your fingers into your palm until the tips of your fingers are touching the middle of your palm. Hold this position for __________ seconds. Extend your fingers back to the starting position, stretching every joint fully. Repeat this exercise 5-10 times with each hand. Straight fist Stand or sit with your arm, hand, and all five fingers pointed straight up. Make sure to keep your wrist straight during the exercise. Gently bend your fingers at the big knuckle, where your  fingers meet your hand, and the middle knuckle. Keep the knuckle at the tips of your fingers straight and try to touch the bottom of your palm. Hold this position for __________ seconds. Extend your fingers back to the starting position, stretching every joint fully. Repeat this exercise 5-10 times with  each hand. Tabletop Stand or sit with your arm, hand, and all five fingers pointed straight up. Make sure to keep your wrist straight during the exercise. Gently bend your fingers at the big knuckle, where your fingers meet your hand, as far down as you can while keeping the small knuckles in your fingers straight. Think of forming a tabletop with your fingers. Hold this position for __________ seconds. Extend your fingers back to the starting position, stretching every joint fully. Repeat this exercise 5-10 times with each hand. Finger spread Place your hand flat on a table with your palm facing down. Make sure your wrist stays straight as you do this exercise. Spread your fingers and thumb apart from each other as far as you can until you feel a gentle stretch. Hold this position for __________ seconds. Bring your fingers and thumb tight together again. Hold this position for __________ seconds. Repeat this exercise 5-10 times with each hand. Making circles Stand or sit with your arm, hand, and all five fingers pointed straight up. Make sure to keep your wrist straight during the exercise. Make a circle by touching the tip of your thumb to the tip of your index finger. Hold for __________ seconds. Then open your hand wide. Repeat this motion with your thumb and each finger on your hand. Repeat this exercise 5-10 times with each hand. Thumb motion Sit with your forearm resting on a table and your wrist straight. Your thumb should be facing up toward the ceiling. Keep your fingers relaxed as you move your thumb. Lift your thumb up as high as you can toward the ceiling. Hold for __________ seconds. Bend your thumb across your palm as far as you can, reaching the tip of your thumb for the small finger (pinkie) side of your palm. Hold for __________ seconds. Repeat this exercise 5-10 times with each hand. Grip strengthening  Hold a stress ball or other soft ball in the middle of your  hand. Slowly increase the pressure, squeezing the ball as much as you can without causing pain. Think of bringing the tips of your fingers into the middle of your palm. All of your finger joints should bend when doing this exercise. Hold your squeeze for __________ seconds, then relax. Repeat this exercise 5-10 times with each hand. Contact a health care provider if: Your hand pain or discomfort gets much worse when you do an exercise. Your hand pain or discomfort does not improve within 2 hours after you exercise. If you have any of these problems, stop doing these exercises right away. Do not do them again unless your health care provider says that you can. Get help right away if: You develop sudden, severe hand pain or swelling. If this happens, stop doing these exercises right away. Do not do them again unless your health care provider says that you can. This information is not intended to replace advice given to you by your health care provider. Make sure you discuss any questions you have with your healthcare provider. Document Revised: 02/09/2019 Document Reviewed: 10/20/2018 Elsevier Patient Education  [place].

## 2021-05-08 ENCOUNTER — Other Ambulatory Visit (HOSPITAL_COMMUNITY): Payer: Self-pay

## 2021-05-08 ENCOUNTER — Other Ambulatory Visit: Payer: Self-pay | Admitting: Family Medicine

## 2021-05-09 ENCOUNTER — Other Ambulatory Visit (HOSPITAL_COMMUNITY): Payer: Self-pay

## 2021-05-09 MED ORDER — LOSARTAN POTASSIUM 50 MG PO TABS
ORAL_TABLET | Freq: Every day | ORAL | 1 refills | Status: DC
  Filled 2021-05-09: qty 60, 30d supply, fill #0
  Filled 2021-06-16: qty 60, 30d supply, fill #1

## 2021-05-12 ENCOUNTER — Other Ambulatory Visit: Payer: Self-pay

## 2021-05-12 ENCOUNTER — Other Ambulatory Visit: Payer: Self-pay | Admitting: Family Medicine

## 2021-05-12 ENCOUNTER — Other Ambulatory Visit (HOSPITAL_COMMUNITY): Payer: Self-pay

## 2021-05-12 ENCOUNTER — Encounter: Payer: Self-pay | Admitting: Family Medicine

## 2021-05-12 DIAGNOSIS — E0842 Diabetes mellitus due to underlying condition with diabetic polyneuropathy: Secondary | ICD-10-CM

## 2021-05-12 MED ORDER — ALBUTEROL SULFATE HFA 108 (90 BASE) MCG/ACT IN AERS
2.0000 | INHALATION_SPRAY | Freq: Four times a day (QID) | RESPIRATORY_TRACT | 0 refills | Status: DC | PRN
  Filled 2021-05-12 – 2021-07-01 (×2): qty 18, 25d supply, fill #0

## 2021-05-12 MED ORDER — FREESTYLE LITE TEST VI STRP
ORAL_STRIP | 12 refills | Status: DC
  Filled 2021-05-12 – 2021-05-21 (×2): qty 100, 50d supply, fill #0

## 2021-05-12 MED ORDER — FREESTYLE LANCETS MISC
12 refills | Status: DC
  Filled 2021-05-12 – 2021-05-21 (×2): qty 100, 50d supply, fill #0

## 2021-05-12 MED ORDER — FREESTYLE LITE W/DEVICE KIT
PACK | 2 refills | Status: DC
  Filled 2021-05-12 – 2021-05-21 (×2): qty 1, 30d supply, fill #0

## 2021-05-12 MED ORDER — SSS 10-5 10-5 % EX FOAM
CUTANEOUS | 3 refills | Status: DC
  Filled 2021-05-12: qty 60, fill #0

## 2021-05-13 ENCOUNTER — Other Ambulatory Visit (HOSPITAL_COMMUNITY): Payer: Self-pay

## 2021-05-15 ENCOUNTER — Ambulatory Visit: Payer: 59 | Admitting: Specialist

## 2021-05-20 ENCOUNTER — Other Ambulatory Visit (HOSPITAL_COMMUNITY): Payer: Self-pay

## 2021-05-21 ENCOUNTER — Other Ambulatory Visit (HOSPITAL_COMMUNITY): Payer: Self-pay

## 2021-05-22 ENCOUNTER — Ambulatory Visit (INDEPENDENT_AMBULATORY_CARE_PROVIDER_SITE_OTHER): Payer: 59 | Admitting: Surgery

## 2021-05-22 ENCOUNTER — Other Ambulatory Visit: Payer: Self-pay

## 2021-05-22 VITALS — Ht 65.0 in | Wt 316.0 lb

## 2021-05-22 DIAGNOSIS — M4316 Spondylolisthesis, lumbar region: Secondary | ICD-10-CM | POA: Diagnosis not present

## 2021-05-22 DIAGNOSIS — M5136 Other intervertebral disc degeneration, lumbar region: Secondary | ICD-10-CM | POA: Diagnosis not present

## 2021-05-22 NOTE — Progress Notes (Signed)
Office Visit Note   Patient: Vanessa Reeves           Date of Birth: 01/27/1971           MRN: [id number] Visit Date: 05/22/2021              Requested by: Midge Minium, MD [address] [place],  [place] [address] PCP: Midge Minium, MD   Assessment & Plan: Visit Diagnoses:  1. Spondylolisthesis, lumbar region   2. Degenerative disc disease, lumbar     Plan: Patient's increased BMI again she understands that she is not the best operative candidate.  I will schedule her consult with Dr. Ernestina Patches to see what he thinks about possibly doing lumbar ESI's.  Follow-up with Dr. Louanne Skye in 6 weeks.  Follow-Up Instructions: Return in about 6 weeks (around 07/03/2021) for with dr Louanne Skye after consult with dr newton and possible ESI.   Orders:  Orders Placed This Encounter  Procedures   Ambulatory referral to Physical Medicine Rehab   No orders of the defined types were placed in this encounter.     Procedures: No procedures performed   Clinical Data: No additional findings.   Subjective: Chief Complaint  Patient presents with   Lower Back - Follow-up    States it feels like it hurts more, states that she has been traveling home more on an airplane. Feels it more in the right buttock and in the right leg   Right Leg - Follow-up    HPI 50 year old female returns with complaints of back pain.  Patient last seen by Dr. Louanne Skye January 2022.  And Dr. Otho Ket note from that last office visit he stated that with increased BMI she is not a good operative candidate.  She continues have ongoing low back pain that radiates into the back of her right buttock and lower leg.  States that this has been aggravated with increased traveling that she has done.  Patient also followed by rheumatologist and states that she is on the spectrum of lupus and Sjogren's. Review of Systems No current cardiac pulmonary GI GU issues  Objective: Vital Signs: Ht 5\' 5"  (1.651 m)   Wt (!) 316 lb  (143.3 kg)   LMP 12/04/2011   BMI 52.59 kg/m   Physical Exam Constitutional:      Appearance: She is obese.  HENT:     Head: Normocephalic and atraumatic.  Eyes:     Extraocular Movements: Extraocular movements intact.  Musculoskeletal:     Comments: Gait is somewhat antalgic.  Lumbar paraspinal tenderness.  Negative logroll.  Negative straight leg raise.  Neurological:     General: No focal deficit present.     Mental Status: She is alert.  Psychiatric:        Mood and Affect: Mood normal.    Ortho Exam  Specialty Comments:  No specialty comments available.  Imaging: No results found.   PMFS History: Patient Active Problem List   Diagnosis Date Noted   Adjustment disorder with depressed mood 10/24/2020   Hearing loss of right ear 09/04/2020   Hyperlipidemia associated with type 2 diabetes mellitus ([place]) 02/21/2020   Insomnia 02/21/2020   Vitamin D deficiency 02/21/2020   Degenerative disc disease, lumbar 02/21/2020   Diabetic polyneuropathy associated with diabetes mellitus due to underlying condition ([place]) 06/14/2019   Visit for preventive health examination 04/11/2018   Breast cancer screening 04/11/2018   Memory changes 11/23/2017   RLS (restless legs syndrome) 03/17/2017  ADHD (attention deficit hyperactivity disorder), 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   Reactive airway disease 08/21/2016   Gastroesophageal reflux disease without esophagitis 12/20/2015   Possible exposure to STD 06/07/2015   Physical exam 12/14/2014   Breast mass, left 02/16/2014   Depression with anxiety 07/17/2013   Essential hypertension 07/17/2013   Eustachian tube dysfunction 07/17/2013   Severe obesity (BMI >= 40) ([place]) 07/17/2013   Past Medical History:  Diagnosis Date   Anxiety    Depression    Diabetes mellitus without complication (HCC)    Herpes    Hyperlipidemia    Hypertension    RLS (restless legs syndrome) 03/17/2017   Right leg    Family History  Problem Relation Age  of Onset   Hypertension Mother    Stroke Father    Hypertension Paternal Grandmother    Diabetes Mellitus II Paternal Grandmother    Lupus Paternal Geophysicist/field seismologist    Healthy Daughter    Healthy Son    Colon cancer Neg Hx    Esophageal cancer Neg Hx    Pancreatic cancer Neg Hx    Stomach cancer Neg Hx    Liver disease Neg Hx     Past Surgical History:  Procedure Laterality Date   ABDOMINAL HYSTERECTOMY     DILATION AND CURETTAGE OF UTERUS     TUBAL LIGATION     Social History   Occupational History   Not on file  Tobacco Use   Smoking status: Former    Types: Cigarettes   Smokeless tobacco: Never  Vaping Use   Vaping Use: Never used  Substance and Sexual Activity   Alcohol use: Yes    Alcohol/week: 0.0 standard drinks    Comment: 2 drinks monthly   Drug use: No   Sexual activity: Yes    Birth control/protection: Surgical

## 2021-05-23 ENCOUNTER — Other Ambulatory Visit: Payer: Self-pay | Admitting: Radiology

## 2021-05-23 ENCOUNTER — Encounter: Payer: Self-pay | Admitting: Family Medicine

## 2021-05-23 ENCOUNTER — Other Ambulatory Visit (HOSPITAL_COMMUNITY): Payer: Self-pay

## 2021-05-23 ENCOUNTER — Encounter: Payer: Self-pay | Admitting: Specialist

## 2021-05-23 MED ORDER — TRAMADOL-ACETAMINOPHEN 37.5-325 MG PO TABS
1.0000 | ORAL_TABLET | Freq: Four times a day (QID) | ORAL | 0 refills | Status: DC | PRN
  Filled 2021-05-23: qty 30, 8d supply, fill #0

## 2021-05-26 ENCOUNTER — Other Ambulatory Visit: Payer: Self-pay | Admitting: Family

## 2021-05-26 ENCOUNTER — Other Ambulatory Visit (HOSPITAL_COMMUNITY): Payer: Self-pay

## 2021-05-26 MED ORDER — SSS 10-5 10-5 % EX FOAM
1.0000 "application " | Freq: Two times a day (BID) | CUTANEOUS | 3 refills | Status: DC
  Filled 2021-05-26 – 2021-05-27 (×2): qty 60, 30d supply, fill #0

## 2021-05-27 ENCOUNTER — Other Ambulatory Visit: Payer: Self-pay | Admitting: Family

## 2021-05-27 ENCOUNTER — Other Ambulatory Visit (HOSPITAL_COMMUNITY): Payer: Self-pay

## 2021-05-27 MED ORDER — SULFACETAMIDE SODIUM-SULFUR 10-5 % EX LIQD
CUTANEOUS | 2 refills | Status: DC
  Filled 2021-05-27: qty 170, 30d supply, fill #0

## 2021-05-27 MED ORDER — SULFACETAMIDE SODIUM-SULFUR 10-2 % EX LIQD
1.0000 "application " | Freq: Two times a day (BID) | CUTANEOUS | 1 refills | Status: DC
  Filled 2021-05-27 – 2021-06-16 (×2): qty 227, 30d supply, fill #0

## 2021-05-28 ENCOUNTER — Other Ambulatory Visit: Payer: Self-pay | Admitting: Family Medicine

## 2021-05-28 ENCOUNTER — Other Ambulatory Visit (HOSPITAL_COMMUNITY): Payer: Self-pay

## 2021-05-28 MED ORDER — TRIAMCINOLONE ACETONIDE 0.1 % EX CREA
TOPICAL_CREAM | Freq: Two times a day (BID) | CUTANEOUS | 3 refills | Status: AC
  Filled 2021-05-28 – 2021-06-16 (×2): qty 60, 30d supply, fill #0

## 2021-05-28 NOTE — Telephone Encounter (Signed)
Medication not listed on current medication list LOV 04/21/21 NOV none

## 2021-06-02 ENCOUNTER — Encounter: Payer: Self-pay | Admitting: Surgery

## 2021-06-05 ENCOUNTER — Other Ambulatory Visit (HOSPITAL_COMMUNITY): Payer: Self-pay

## 2021-06-16 ENCOUNTER — Other Ambulatory Visit: Payer: Self-pay | Admitting: Family Medicine

## 2021-06-16 ENCOUNTER — Other Ambulatory Visit (HOSPITAL_COMMUNITY): Payer: Self-pay

## 2021-06-17 ENCOUNTER — Encounter: Payer: Self-pay | Admitting: Family Medicine

## 2021-06-17 ENCOUNTER — Other Ambulatory Visit (HOSPITAL_COMMUNITY): Payer: Self-pay

## 2021-06-18 ENCOUNTER — Encounter: Payer: Self-pay | Admitting: Family Medicine

## 2021-06-18 ENCOUNTER — Other Ambulatory Visit: Payer: Self-pay | Admitting: Family

## 2021-06-18 ENCOUNTER — Other Ambulatory Visit (HOSPITAL_COMMUNITY): Payer: Self-pay

## 2021-06-18 MED ORDER — DESONIDE 0.05 % EX CREA
TOPICAL_CREAM | Freq: Two times a day (BID) | CUTANEOUS | 0 refills | Status: DC
  Filled 2021-06-18: qty 30, 15d supply, fill #0

## 2021-06-19 ENCOUNTER — Other Ambulatory Visit (HOSPITAL_COMMUNITY): Payer: Self-pay

## 2021-06-19 ENCOUNTER — Other Ambulatory Visit: Payer: Self-pay | Admitting: Family

## 2021-06-19 MED ORDER — SULFACETAMIDE SODIUM-SULFUR 10-5 % EX FOAM
1.0000 "application " | Freq: Two times a day (BID) | CUTANEOUS | 1 refills | Status: DC
  Filled 2021-06-19: qty 100, 30d supply, fill #0

## 2021-06-19 NOTE — Telephone Encounter (Signed)
What diagnosis can be used for prior authorization for both of the topical medications.

## 2021-06-20 ENCOUNTER — Other Ambulatory Visit: Payer: Self-pay | Admitting: Specialist

## 2021-06-20 ENCOUNTER — Other Ambulatory Visit (HOSPITAL_COMMUNITY): Payer: Self-pay

## 2021-06-20 ENCOUNTER — Other Ambulatory Visit: Payer: Self-pay | Admitting: Family Medicine

## 2021-06-20 DIAGNOSIS — M5136 Other intervertebral disc degeneration, lumbar region: Secondary | ICD-10-CM

## 2021-06-20 MED ORDER — LOSARTAN POTASSIUM 50 MG PO TABS
ORAL_TABLET | Freq: Every day | ORAL | 1 refills | Status: DC
  Filled 2021-06-20: qty 60, fill #0
  Filled 2021-07-16: qty 60, 30d supply, fill #0
  Filled 2021-08-26: qty 60, 30d supply, fill #1

## 2021-06-20 MED ORDER — CYCLOBENZAPRINE HCL 10 MG PO TABS
ORAL_TABLET | Freq: Every day | ORAL | 0 refills | Status: DC
  Filled 2021-06-20: qty 30, 30d supply, fill #0

## 2021-06-20 MED FILL — Gabapentin Cap 300 MG: ORAL | 30 days supply | Qty: 30 | Fill #0 | Status: AC

## 2021-06-23 ENCOUNTER — Other Ambulatory Visit (HOSPITAL_COMMUNITY): Payer: Self-pay

## 2021-06-24 ENCOUNTER — Other Ambulatory Visit (HOSPITAL_COMMUNITY): Payer: Self-pay

## 2021-06-30 ENCOUNTER — Other Ambulatory Visit (HOSPITAL_COMMUNITY): Payer: Self-pay

## 2021-06-30 ENCOUNTER — Other Ambulatory Visit: Payer: Self-pay | Admitting: Family

## 2021-07-01 ENCOUNTER — Other Ambulatory Visit (HOSPITAL_COMMUNITY): Payer: Self-pay

## 2021-07-01 ENCOUNTER — Other Ambulatory Visit: Payer: Self-pay

## 2021-07-01 ENCOUNTER — Other Ambulatory Visit: Payer: Self-pay | Admitting: Family Medicine

## 2021-07-01 MED ORDER — DESONIDE 0.05 % EX CREA
TOPICAL_CREAM | Freq: Two times a day (BID) | CUTANEOUS | 3 refills | Status: DC
  Filled 2021-07-01: qty 30, 30d supply, fill #0
  Filled 2021-08-26: qty 30, 30d supply, fill #1
  Filled 2021-10-22: qty 30, 30d supply, fill #2
  Filled 2022-02-26: qty 30, 30d supply, fill #3

## 2021-07-02 ENCOUNTER — Other Ambulatory Visit: Payer: Self-pay

## 2021-07-02 ENCOUNTER — Encounter (HOSPITAL_COMMUNITY): Payer: Self-pay | Admitting: Gastroenterology

## 2021-07-02 NOTE — Progress Notes (Signed)
Attempted to obtain medical history via telephone, unable to reach at this time. I left a voicemail to return pre surgical testing department's phone call.  

## 2021-07-03 ENCOUNTER — Encounter: Payer: Self-pay | Admitting: Gastroenterology

## 2021-07-03 ENCOUNTER — Other Ambulatory Visit: Payer: Self-pay | Admitting: Gastroenterology

## 2021-07-03 ENCOUNTER — Other Ambulatory Visit (HOSPITAL_COMMUNITY): Payer: Self-pay

## 2021-07-03 ENCOUNTER — Other Ambulatory Visit: Payer: Self-pay

## 2021-07-03 ENCOUNTER — Ambulatory Visit (AMBULATORY_SURGERY_CENTER): Payer: Self-pay | Admitting: *Deleted

## 2021-07-03 VITALS — Ht 65.0 in | Wt 318.0 lb

## 2021-07-03 DIAGNOSIS — Z1211 Encounter for screening for malignant neoplasm of colon: Secondary | ICD-10-CM

## 2021-07-03 MED ORDER — NA SULFATE-K SULFATE-MG SULF 17.5-3.13-1.6 GM/177ML PO SOLN
1.0000 | Freq: Once | ORAL | 0 refills | Status: AC
  Filled 2021-07-03: qty 354, 1d supply, fill #0

## 2021-07-03 MED ORDER — NA SULFATE-K SULFATE-MG SULF 17.5-3.13-1.6 GM/177ML PO SOLN
1.0000 | Freq: Once | ORAL | 0 refills | Status: DC
  Filled 2021-07-03: qty 354, 1d supply, fill #0

## 2021-07-03 NOTE — Progress Notes (Signed)
No egg or soy allergy known to patient  No issues with past sedation with any surgeries or procedures Patient HAS  difficulty with intubation-2013 SURGERY REQUIRED VIDEO LARYNGOSCOPY   No FH of Malignant Hyperthermia No diet pills per patient No home 02 use per patient  No blood thinners per patient  Pt STATES issues with constipation DAILY- USES HERBAL LIFE - NO DAILY BM- IF SHE TAKES THE HERBAL LIFE GOES DAILY BUT DOESN'T FEEL CLEANED OUT- WILL HAVE A BM MAYBE 3 X A WEEK  No A fib or A flutter  EMMI video to pt or via [place] 19 guidelines implemented in [place] today with Pt and RN   Pt is fully vaccinated  for Covid   Due to the COVID-19 pandemic we are asking patients to follow certain guidelines.  Pt aware of COVID protocols and [facility] guidelines

## 2021-07-04 ENCOUNTER — Other Ambulatory Visit (HOSPITAL_COMMUNITY): Payer: Self-pay

## 2021-07-08 ENCOUNTER — Other Ambulatory Visit (HOSPITAL_COMMUNITY): Payer: Self-pay

## 2021-07-08 ENCOUNTER — Other Ambulatory Visit: Payer: Self-pay | Admitting: Family

## 2021-07-08 MED ORDER — SULFACETAMIDE SODIUM-SULFUR 10-5 % EX FOAM
1.0000 "application " | Freq: Two times a day (BID) | CUTANEOUS | 1 refills | Status: DC
  Filled 2021-07-08: qty 100, 30d supply, fill #0

## 2021-07-09 ENCOUNTER — Encounter (HOSPITAL_COMMUNITY)
Admission: RE | Disposition: A | Discharge status: Home / Self Care | Payer: Self-pay | Source: Home / Self Care | Attending: Gastroenterology

## 2021-07-09 ENCOUNTER — Ambulatory Visit (HOSPITAL_COMMUNITY): Payer: 59 | Admitting: Certified Registered Nurse Anesthetist

## 2021-07-09 ENCOUNTER — Ambulatory Visit (HOSPITAL_COMMUNITY)
Admission: RE | Admit: 2021-07-09 | Discharge: 2021-07-09 | Disposition: A | Discharge status: Home / Self Care | Payer: 59 | Attending: Gastroenterology | Admitting: Gastroenterology

## 2021-07-09 ENCOUNTER — Other Ambulatory Visit: Payer: Self-pay

## 2021-07-09 ENCOUNTER — Encounter (HOSPITAL_COMMUNITY): Payer: Self-pay | Admitting: Gastroenterology

## 2021-07-09 DIAGNOSIS — Z1211 Encounter for screening for malignant neoplasm of colon: Secondary | ICD-10-CM | POA: Insufficient documentation

## 2021-07-09 DIAGNOSIS — Z823 Family history of stroke: Secondary | ICD-10-CM | POA: Insufficient documentation

## 2021-07-09 DIAGNOSIS — Z8489 Family history of other specified conditions: Secondary | ICD-10-CM | POA: Insufficient documentation

## 2021-07-09 DIAGNOSIS — K644 Residual hemorrhoidal skin tags: Secondary | ICD-10-CM | POA: Insufficient documentation

## 2021-07-09 DIAGNOSIS — K648 Other hemorrhoids: Secondary | ICD-10-CM | POA: Diagnosis not present

## 2021-07-09 DIAGNOSIS — Z79899 Other long term (current) drug therapy: Secondary | ICD-10-CM | POA: Insufficient documentation

## 2021-07-09 DIAGNOSIS — Z832 Family history of diseases of the blood and blood-forming organs and certain disorders involving the immune mechanism: Secondary | ICD-10-CM | POA: Diagnosis not present

## 2021-07-09 DIAGNOSIS — Z880 Allergy status to penicillin: Secondary | ICD-10-CM | POA: Insufficient documentation

## 2021-07-09 DIAGNOSIS — D125 Benign neoplasm of sigmoid colon: Secondary | ICD-10-CM | POA: Diagnosis not present

## 2021-07-09 DIAGNOSIS — K635 Polyp of colon: Secondary | ICD-10-CM | POA: Diagnosis not present

## 2021-07-09 DIAGNOSIS — D123 Benign neoplasm of transverse colon: Secondary | ICD-10-CM | POA: Diagnosis not present

## 2021-07-09 DIAGNOSIS — Z7951 Long term (current) use of inhaled steroids: Secondary | ICD-10-CM | POA: Insufficient documentation

## 2021-07-09 DIAGNOSIS — Z833 Family history of diabetes mellitus: Secondary | ICD-10-CM | POA: Insufficient documentation

## 2021-07-09 DIAGNOSIS — Z87891 Personal history of nicotine dependence: Secondary | ICD-10-CM | POA: Insufficient documentation

## 2021-07-09 DIAGNOSIS — Z7984 Long term (current) use of oral hypoglycemic drugs: Secondary | ICD-10-CM | POA: Diagnosis not present

## 2021-07-09 DIAGNOSIS — K621 Rectal polyp: Secondary | ICD-10-CM | POA: Diagnosis not present

## 2021-07-09 DIAGNOSIS — Z8249 Family history of ischemic heart disease and other diseases of the circulatory system: Secondary | ICD-10-CM | POA: Insufficient documentation

## 2021-07-09 DIAGNOSIS — E119 Type 2 diabetes mellitus without complications: Secondary | ICD-10-CM | POA: Diagnosis not present

## 2021-07-09 DIAGNOSIS — Z6841 Body Mass Index (BMI) 40.0 and over, adult: Secondary | ICD-10-CM

## 2021-07-09 HISTORY — PX: COLONOSCOPY WITH PROPOFOL: SHX5780

## 2021-07-09 HISTORY — PX: POLYPECTOMY: SHX5525

## 2021-07-09 HISTORY — DX: Other complications of anesthesia, initial encounter: T88.59XA

## 2021-07-09 LAB — GLUCOSE, CAPILLARY: Glucose-Capillary: 97 mg/dL (ref 70–99)

## 2021-07-09 SURGERY — COLONOSCOPY WITH PROPOFOL
Anesthesia: Monitor Anesthesia Care

## 2021-07-09 MED ORDER — ALBUTEROL SULFATE HFA 108 (90 BASE) MCG/ACT IN AERS
INHALATION_SPRAY | RESPIRATORY_TRACT | Status: AC
  Filled 2021-07-09: qty 6.7

## 2021-07-09 MED ORDER — PROPOFOL 10 MG/ML IV BOLUS
INTRAVENOUS | Status: DC | PRN
  Administered 2021-07-09: 30 mg via INTRAVENOUS

## 2021-07-09 MED ORDER — PROPOFOL 500 MG/50ML IV EMUL
INTRAVENOUS | Status: DC | PRN
  Administered 2021-07-09: 100 ug/kg/min via INTRAVENOUS

## 2021-07-09 MED ORDER — SODIUM CHLORIDE 0.9 % IV SOLN: INTRAVENOUS | Status: DC

## 2021-07-09 MED ORDER — LACTATED RINGERS IV SOLN
INTRAVENOUS | Status: AC | PRN
  Administered 2021-07-09: 10 mL/h via INTRAVENOUS

## 2021-07-09 MED ORDER — ALBUTEROL SULFATE HFA 108 (90 BASE) MCG/ACT IN AERS
INHALATION_SPRAY | RESPIRATORY_TRACT | Status: DC | PRN
  Administered 2021-07-09: 2 via RESPIRATORY_TRACT

## 2021-07-09 MED ORDER — LIDOCAINE 2% (20 MG/ML) 5 ML SYRINGE
INTRAMUSCULAR | Status: DC | PRN
  Administered 2021-07-09: 100 mg via INTRAVENOUS

## 2021-07-09 SURGICAL SUPPLY — 21 items

## 2021-07-09 NOTE — Transfer of Care (Signed)
Immediate Anesthesia Transfer of Care Note  Patient: Vanessa Reeves  Procedure(s) Performed: COLONOSCOPY WITH PROPOFOL POLYPECTOMY  Patient Location: PACU and Endoscopy Unit  Anesthesia Type:MAC  Level of Consciousness: awake, alert  and patient cooperative  Airway & Oxygen Therapy: Patient Spontanous Breathing and Patient connected to face mask oxygen  Post-op Assessment: Report given to RN and Post -op Vital signs reviewed and stable  Post vital signs: Reviewed and stable  Last Vitals:  Vitals Value Taken Time  BP    Temp    Pulse    Resp    SpO2      Last Pain:  Vitals:   07/09/21 1130  TempSrc: Oral         Complications: No notable events documented.

## 2021-07-09 NOTE — Anesthesia Postprocedure Evaluation (Signed)
Anesthesia Post Note  Patient: Vanessa Reeves  Procedure(s) Performed: COLONOSCOPY WITH PROPOFOL POLYPECTOMY     Patient location during evaluation: PACU Anesthesia Type: MAC Level of consciousness: awake and alert Pain management: pain level controlled Vital Signs Assessment: post-procedure vital signs reviewed and stable Respiratory status: spontaneous breathing, nonlabored ventilation and respiratory function stable Cardiovascular status: stable and blood pressure returned to baseline Anesthetic complications: no   No notable events documented.  Last Vitals:  Vitals:   07/09/21 1322 07/09/21 1330  BP: (!) 156/76 (!) 144/90  Pulse: 63 64  Resp: 14 14  Temp:    SpO2: 100% 94%    Last Pain:  Vitals:   07/09/21 1330  TempSrc:   PainSc: 0-No pain                 Audry Pili

## 2021-07-09 NOTE — Op Note (Signed)
[facility] Patient Name: Vanessa Reeves Procedure Date: 07/09/2021 MRN: [id number] Attending MD: Mauri Pole , MD Date of Birth: [birth year] CSN: [id number] Age: 50 Admit Type: Inpatient Procedure:                Colonoscopy Indications:              Screening for colorectal malignant neoplasm Providers:                Mauri Pole, MD, Jeanella Cara, RN,                            Benetta Spar, Technician Referring MD:              Medicines:                Monitored Anesthesia Care Complications:            No immediate complications. Estimated Blood Loss:     Estimated blood loss was minimal. Procedure:                Pre-Anesthesia Assessment:                           - Prior to the procedure, a History and Physical                            was performed, and patient medications and                            allergies were reviewed. The patient's tolerance of                            previous anesthesia was also reviewed. The risks                            and benefits of the procedure and the sedation                            options and risks were discussed with the patient.                            All questions were answered, and informed consent                            was obtained. Prior Anticoagulants: The patient has                            taken no previous anticoagulant or antiplatelet                            agents. ASA Grade Assessment: III - A patient with                            severe systemic disease. After reviewing the risks  and benefits, the patient was deemed in                            satisfactory condition to undergo the procedure.                           After obtaining informed consent, the colonoscope                            was passed under direct vision. Throughout the                            procedure, the patient's blood pressure, pulse, and                             oxygen saturations were monitored continuously. The                            [id number]) Olympus colonoscope was                            introduced through the anus and advanced to the the                            cecum, identified by appendiceal orifice and                            ileocecal valve. The colonoscopy was performed                            without difficulty. The patient tolerated the                            procedure well. The quality of the bowel                            preparation was excellent. The ileocecal valve,                            appendiceal orifice, and rectum were photographed. Scope In: 12:41:47 PM Scope Out: 1:06:58 PM Scope Withdrawal Time: 0 hours 14 minutes 37 seconds  Total Procedure Duration: 0 hours 25 minutes 11 seconds  Findings:      The perianal and digital rectal examinations were normal.      Four sessile polyps were found in the rectum, sigmoid colon and       transverse colon. The polyps were 4 to 11 mm in size. These polyps were       removed with a cold snare. Resection and retrieval were complete.      Non-bleeding external and internal hemorrhoids were found during       retroflexion. The hemorrhoids were medium-sized. Impression:               - Four 4 to 11 mm polyps in the rectum, in the  sigmoid colon and in the transverse colon, removed                            with a cold snare. Resected and retrieved.                           - Non-bleeding external and internal hemorrhoids. Moderate Sedation:      Not Applicable - Patient had care per Anesthesia. Recommendation:           - Patient has a contact number available for                            emergencies. The signs and symptoms of potential                            delayed complications were discussed with the                            patient. Return to normal activities tomorrow.                             Written discharge instructions were provided to the                            patient.                           - Resume previous diet.                           - Continue present medications.                           - Await pathology results.                           - Repeat colonoscopy in 3 - 5 years for                            surveillance based on pathology results. Procedure Code(s):        --- Professional ---                           [phone], Colonoscopy, flexible; with removal of                            tumor(s), polyp(s), or other lesion(s) by snare                            technique Diagnosis Code(s):        --- Professional ---                           Z12.11, Encounter for screening for malignant  neoplasm of colon                           K62.1, Rectal polyp                           K63.5, Polyp of colon                           K64.8, Other hemorrhoids CPT copyright 2019 American Medical Association. All rights reserved. The codes documented in this report are preliminary and upon coder review may  be revised to meet current compliance requirements. Mauri Pole, MD 07/09/2021 1:37:51 PM This report has been signed electronically. Number of Addenda: 0

## 2021-07-09 NOTE — Anesthesia Procedure Notes (Signed)
Procedure Name: MAC Date/Time: 07/09/2021 12:29 PM Performed by: West Pugh, CRNA Pre-anesthesia Checklist: Patient identified, Emergency Drugs available, Suction available, Patient being monitored and Timeout performed Patient Re-evaluated:Patient Re-evaluated prior to induction Oxygen Delivery Method: Simple face mask Preoxygenation: Pre-oxygenation with 100% oxygen Induction Type: IV induction Placement Confirmation: positive ETCO2 Dental Injury: Teeth and Oropharynx as per pre-operative assessment

## 2021-07-09 NOTE — Anesthesia Preprocedure Evaluation (Addendum)
Anesthesia Evaluation  Patient identified by MRN, date of birth, ID band Patient awake    Reviewed: Allergy & Precautions, NPO status , Patient's Chart, lab work & pertinent test results  History of Anesthesia Complications (+) DIFFICULT AIRWAY and history of anesthetic complications  Airway Mallampati: III  TM Distance: >3 FB Neck ROM: Full    Dental  (+) Dental Advisory Given   Pulmonary asthma , former smoker,    Pulmonary exam normal        Cardiovascular hypertension, Pt. on medications Normal cardiovascular exam     Neuro/Psych PSYCHIATRIC DISORDERS Anxiety Depression  Neuromuscular disease (RLS)    GI/Hepatic Neg liver ROS, GERD  Medicated and Controlled,  Endo/Other  diabetes, Type 2, Oral Hypoglycemic AgentsMorbid obesity  Renal/GU negative Renal ROS     Musculoskeletal  (+) Arthritis ,   Abdominal (+) + obese,   Peds  Hematology negative hematology ROS (+)   Anesthesia Other Findings   Reproductive/Obstetrics                            Anesthesia Physical Anesthesia Plan  ASA: 3  Anesthesia Plan: MAC   Post-op Pain Management:    Induction:   PONV Risk Score and Plan: 2 and Propofol infusion and Treatment may vary due to age or medical condition  Airway Management Planned: Nasal Cannula and Natural Airway  Additional Equipment: None  Intra-op Plan:   Post-operative Plan:   Informed Consent: I have reviewed the patients History and Physical, chart, labs and discussed the procedure including the risks, benefits and alternatives for the proposed anesthesia with the patient or authorized representative who has indicated his/her understanding and acceptance.       Plan Discussed with: CRNA and Anesthesiologist  Anesthesia Plan Comments:        Anesthesia Quick Evaluation

## 2021-07-09 NOTE — H&P (Signed)
[facility] History and Physical   Primary Care Physician:  Midge Minium, MD   Reason for Procedure:  Colorectal cancer screening  Plan:    Screening colonoscopy with possible interventions as needed     HPI: Vanessa Reeves is a very pleasant 50 y.o. female here for screening colonoscopy. Denies any nausea, vomiting, abdominal pain, melena or bright red blood per rectum  The risks and benefits as well as alternatives of endoscopic procedure(s) have been discussed and reviewed. All questions answered. The patient agrees to proceed.    Past Medical History:  Diagnosis Date   Allergy    Anxiety    Arthritis    HANDS   Asthma    HAS MDI   Complication of anesthesia    Depression    Diabetes mellitus without complication ([place])    Difficult airway for intubation    2013 used Video Laryngoscope   GERD (gastroesophageal reflux disease)    Herpes    Hyperlipidemia    Hypertension    RLS (restless legs syndrome) 03/17/2017   Right leg    Past Surgical History:  Procedure Laterality Date   ABDOMINAL HYSTERECTOMY     DILATION AND CURETTAGE OF UTERUS     TUBAL LIGATION      Prior to Admission medications   Medication Sig Start Date End Date Taking? Authorizing Provider  albuterol (VENTOLIN HFA) 108 (90 Base) MCG/ACT inhaler Inhale 2 puffs into the lungs every 6 (six) hours as needed for wheezing or shortness of breath. 05/12/21 05/12/22 Yes Midge Minium, MD  ALPRAZolam Duanne Moron) 0.5 MG tablet Take 1 tablet (0.5 mg total) by mouth 2 (two) times daily as needed for anxiety. 04/21/21  Yes Midge Minium, MD  atenolol (TENORMIN) 50 MG tablet TAKE 2 TABLETS BY MOUTH AT BEDTIME Patient taking differently: Take 100 mg by mouth daily. 01/08/21 01/08/22 Yes Midge Minium, MD  B Complex-C (B-COMPLEX WITH VITAMIN C) tablet Take 1 tablet by mouth daily.   Yes [provider]  Blood Glucose Monitoring Suppl (FREESTYLE LITE) w/Device KIT Use as  directed to test blood glucose once to twice daily. 05/12/21  Yes Midge Minium, MD  cetirizine (ZYRTEC) 10 MG tablet Take 10 mg by mouth daily.   Yes [provider]  cyclobenzaprine (FLEXERIL) 10 MG tablet TAKE 1 TABLET BY MOUTH EVERY NIGHT AT BEDTIME Patient taking differently: Take 10 mg by mouth at bedtime as needed for muscle spasms. 06/20/21 06/20/22 Yes Jessy Oto, MD  desonide (DESOWEN) 0.05 % cream Apply topically 2 (two) times daily. 07/01/21  Yes Dutch Quint B, FNP  escitalopram (LEXAPRO) 10 MG tablet Take 1 tablet (10 mg total) by mouth daily. 04/21/21  Yes Midge Minium, MD  fluticasone [facility]) 50 MCG/ACT nasal spray USE 2 SPRAYS IN [facility] NOSTRIL ONCE A DAY 09/04/20 09/04/21 Yes Midge Minium, MD  furosemide (LASIX) 20 MG tablet TAKE 1 TABLET BY MOUTH ONCE A DAY 02/19/21 02/19/22 Yes Midge Minium, MD  gabapentin (NEURONTIN) 300 MG capsule TAKE 1 CAPSULE BY MOUTH EVERY NIGHT AT BEDTIME Patient taking differently: Take 300 mg by mouth at bedtime as needed (pain). 11/27/20 11/27/21 Yes Jessy Oto, MD  glucose blood (FREESTYLE LITE) test strip Use as directed to test blood glucose once to twice daily. 05/12/21  Yes Midge Minium, MD  hydrochlorothiazide (HYDRODIURIL) 25 MG tablet TAKE 1 TABLET BY MOUTH ONCE A DAY 04/08/21 04/08/22 Yes Midge Minium, MD  hydroxychloroquine (PLAQUENIL) 200  MG tablet TAKE 1 TABLET BY MOUTH 2 TIMES DAILY 02/19/21 02/19/22 Yes Ofilia Neas, PA-C  Lancets (FREESTYLE) lancets Use to test blood glucose once or twice daily. 05/12/21  Yes Midge Minium, MD  losartan (COZAAR) 50 MG tablet TAKE 2 TABLETS BY MOUTH ONCE A DAY 06/20/21 06/20/22 Yes Midge Minium, MD  metFORMIN (GLUCOPHAGE-XR) 500 MG 24 hr tablet TAKE 1 TABLET BY MOUTH TWO TIMES DAILY WITH FOOD Patient taking differently: Take 500 mg by mouth daily with breakfast. 02/21/20 07/03/21 Yes Midge Minium, MD  Omega-3 Fatty Acids (FISH OIL) 1000 MG CAPS Take  1,000 mg by mouth daily.   Yes [provider]  ondansetron (ZOFRAN) 4 MG tablet TAKE 1 TABLET BY MOUTH EVERY 6 HOURS Patient taking differently: Take 4 mg by mouth every 6 (six) hours as needed for nausea or vomiting. 05/07/21 05/07/22 Yes Deveshwar, Abel Presto, MD  pantoprazole (PROTONIX) 40 MG tablet TAKE 1 TABLET (40 MG TOTAL) BY MOUTH DAILY. 01/27/21 01/27/22 Yes Somalia Segler, Venia Minks, MD  Sulfacetamide Sodium-Sulfur 10-5 % FOAM Apply 1 application topically in the morning and at bedtime. 07/08/21  Yes Dutch Quint B, FNP  traMADol-acetaminophen (ULTRACET) 37.5-325 MG tablet TAKE 1 TABLET BY MOUTH EVERY 6 HOURS AS NEEDED Patient taking differently: Take 1 tablet by mouth every 6 (six) hours as needed for moderate pain. 05/23/21 11/19/21 Yes Lanae Crumbly, PA-C  traZODone (DESYREL) 50 MG tablet TAKE 1/2-1 TABLET BY MOUTH AT BEDTIME AS NEEDED FOR SLEEP Patient taking differently: Take 50 mg by mouth at bedtime as needed for sleep. 02/21/20 07/03/21 Yes Midge Minium, MD  triamcinolone cream (KENALOG) 0.1 % Apply topically 2 (two) times daily. 05/28/21 05/28/22 Yes Dutch Quint B, FNP  ZINC-VITAMIN C PO Take 1 tablet by mouth daily.   Yes [provider]    Current Facility-Administered Medications  Medication Dose Route Frequency Provider Last Rate Last Admin   0.9 %  sodium chloride infusion   Intravenous Continuous Mirna Sutcliffe, Venia Minks, MD        Allergies as of 03/19/2021 - Review Complete 01/27/2021  Allergen Reaction Noted   Penicillins Other (See Comments) 12/16/2011    Family History  Problem Relation Age of Onset   Hypertension Mother    Clotting disorder Mother    Stroke Father    Hypertension Paternal Grandmother    Diabetes Mellitus II Paternal Grandmother    Lupus Paternal Geophysicist/field seismologist    Healthy Daughter    Healthy Son    Colon cancer Neg Hx    Esophageal cancer Neg Hx    Pancreatic cancer Neg Hx    Stomach cancer Neg Hx    Liver disease Neg Hx    Colon  polyps Neg Hx     Social History   Socioeconomic History   Marital status: Married    Spouse name: Not on file   Number of children: Not on file   Years of education: Not on file   Highest education level: Not on file  Occupational History   Not on file  Tobacco Use   Smoking status: Former    Types: Cigarettes   Smokeless tobacco: Never  Vaping Use   Vaping Use: Never used  Substance and Sexual Activity   Alcohol use: Yes    Alcohol/week: 0.0 standard drinks    Comment: 2 drinks monthly   Drug use: No   Sexual activity: Yes    Birth control/protection: Surgical  Other Topics Concern   Not on file  Social History Narrative   Lives with husband and 2 children in a 2 story home.     Works as a Chartered certified accountant at [employer].     Highest level of education:  High school, in college now   Social Determinants of Health   Financial Resource Strain: Not on file  Food Insecurity: Not on file  Transportation Needs: Not on file  Physical Activity: Not on file  Stress: Not on file  Social Connections: Not on file  Intimate Partner Violence: Not on file    Review of Systems:  All other review of systems negative except as mentioned in the HPI.  Physical Exam: Vital signs in last 24 hours: Temp:  [98.5 F (36.9 C)] 98.5 F (36.9 C) (09/07 1130) Pulse Rate:  [70] 70 (09/07 1130) Resp:  [16] 16 (09/07 1130) BP: (144)/(90) 144/90 (09/07 1130) SpO2:  [100 %] 100 % (09/07 1130) Weight:  [144 kg] 144 kg (09/07 1130)   General:   Alert, NAD Lungs:  Clear .   Heart:  Regular rate and rhythm Abdomen:  Soft, nontender and nondistended. Neuro/Psych:  Alert and cooperative. Normal mood and affect. A and O x 3  Reviewed labs, radiology imaging, old records and pertinent past GI work up     K. Denzil Magnuson , MD [phone]

## 2021-07-09 NOTE — Progress Notes (Signed)
Went over discharge instructions with patient and her husband. Instructed to take her blood pressure med. As scheduled this afternoon

## 2021-07-10 ENCOUNTER — Encounter (HOSPITAL_COMMUNITY): Payer: Self-pay | Admitting: Gastroenterology

## 2021-07-10 LAB — SURGICAL PATHOLOGY

## 2021-07-11 ENCOUNTER — Other Ambulatory Visit (HOSPITAL_COMMUNITY): Payer: Self-pay

## 2021-07-16 ENCOUNTER — Other Ambulatory Visit (HOSPITAL_COMMUNITY): Payer: Self-pay

## 2021-07-16 MED FILL — Atenolol Tab 50 MG: ORAL | 90 days supply | Qty: 180 | Fill #1 | Status: AC

## 2021-07-17 ENCOUNTER — Other Ambulatory Visit: Payer: Self-pay | Admitting: Family Medicine

## 2021-07-17 ENCOUNTER — Other Ambulatory Visit (HOSPITAL_COMMUNITY): Payer: Self-pay

## 2021-07-17 MED ORDER — METFORMIN HCL ER 500 MG PO TB24
ORAL_TABLET | Freq: Two times a day (BID) | ORAL | 1 refills | Status: DC
  Filled 2021-07-17: qty 180, 90d supply, fill #0
  Filled 2021-12-12: qty 180, 90d supply, fill #1

## 2021-07-20 ENCOUNTER — Other Ambulatory Visit (HOSPITAL_COMMUNITY): Payer: Self-pay

## 2021-07-20 MED FILL — Fluticasone Propionate Nasal Susp 50 MCG/ACT: NASAL | 30 days supply | Qty: 16 | Fill #1 | Status: AC

## 2021-07-21 ENCOUNTER — Other Ambulatory Visit (HOSPITAL_COMMUNITY): Payer: Self-pay

## 2021-07-24 ENCOUNTER — Encounter: Payer: Self-pay | Admitting: Family Medicine

## 2021-07-24 ENCOUNTER — Telehealth (INDEPENDENT_AMBULATORY_CARE_PROVIDER_SITE_OTHER): Payer: 59 | Admitting: Family Medicine

## 2021-07-24 ENCOUNTER — Other Ambulatory Visit (HOSPITAL_COMMUNITY): Payer: Self-pay

## 2021-07-24 ENCOUNTER — Encounter: Payer: Self-pay | Admitting: Gastroenterology

## 2021-07-24 DIAGNOSIS — J4541 Moderate persistent asthma with (acute) exacerbation: Secondary | ICD-10-CM

## 2021-07-24 DIAGNOSIS — U071 COVID-19: Secondary | ICD-10-CM

## 2021-07-24 MED ORDER — GUAIFENESIN-CODEINE 100-10 MG/5ML PO SYRP
10.0000 mL | ORAL_SOLUTION | Freq: Three times a day (TID) | ORAL | 0 refills | Status: DC | PRN
  Filled 2021-07-24: qty 120, 4d supply, fill #0

## 2021-07-24 MED ORDER — PREDNISONE 10 MG PO TABS
ORAL_TABLET | ORAL | 0 refills | Status: DC
  Filled 2021-07-24: qty 18, 9d supply, fill #0

## 2021-07-24 MED ORDER — FLUTICASONE PROPIONATE HFA 110 MCG/ACT IN AERO
2.0000 | INHALATION_SPRAY | Freq: Two times a day (BID) | RESPIRATORY_TRACT | 12 refills | Status: DC
  Filled 2021-07-24: qty 12, 30d supply, fill #0

## 2021-07-24 NOTE — Progress Notes (Signed)
I connected with  Vanessa Reeves on 07/24/21 by a video enabled telemedicine application and verified that I am speaking with the correct person using two identifiers.   I discussed the limitations of evaluation and management by telemedicine. The patient expressed understanding and agreed to proceed.

## 2021-07-24 NOTE — Progress Notes (Signed)
Virtual Visit via Video   I connected with patient on 07/24/21 at  8:00 AM EDT by a video enabled telemedicine application and verified that I am speaking with the correct person using two identifiers.  Location patient: Home Location provider: [facility] Persons participating in the virtual visit: Patient, Provider, [place] (Sabrina M)  I discussed the limitations of evaluation and management by telemedicine and the availability of in person appointments. The patient expressed understanding and agreed to proceed.  Subjective:   HPI:   Cough- pt tested + for COVID on Friday.  Pt reports feeling somewhat better.  + congestion.  Cough is productive of yellow sputum.  + SOB.  Has been using inhaler more frequently.  SOB is occurring even at rest.  No controller inhaler.  Low grade fevers have improved.  No current body aches.  + diarrhea.  ROS:   See pertinent positives and negatives per HPI.  Patient Active Problem List   Diagnosis Date Noted   Essential hypertension 07/17/2013    Priority: Medium   Polyp of transverse colon    Polyp of sigmoid colon    Polyp of rectum    Adjustment disorder with depressed mood 10/24/2020   Hearing loss of right ear 09/04/2020   Hyperlipidemia associated with type 2 diabetes mellitus ([place]) 02/21/2020   Insomnia 02/21/2020   Vitamin D deficiency 02/21/2020   Degenerative disc disease, lumbar 02/21/2020   Diabetic polyneuropathy associated with diabetes mellitus due to underlying condition ([place]) 06/14/2019   Special screening for malignant neoplasms, colon 04/11/2018   Memory changes 11/23/2017   RLS (restless legs syndrome) 03/17/2017   ADHD (attention deficit hyperactivity disorder), 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   Reactive airway disease 08/21/2016   Gastroesophageal reflux disease without esophagitis 12/20/2015   Breast mass, left 02/16/2014   Depression with anxiety 07/17/2013   Eustachian tube dysfunction 07/17/2013    Adult BMI 50.0-59.9 kg/sq m ([place]) 07/17/2013    Social History   Tobacco Use   Smoking status: Former    Types: Cigarettes   Smokeless tobacco: Never  Substance Use Topics   Alcohol use: Yes    Alcohol/week: 0.0 standard drinks    Comment: 2 drinks monthly    Current Outpatient Medications:    albuterol (VENTOLIN HFA) 108 (90 Base) MCG/ACT inhaler, Inhale 2 puffs into the lungs every 6 (six) hours as needed for wheezing or shortness of breath., Disp: 18 g, Rfl: 0   ALPRAZolam (XANAX) 0.5 MG tablet, Take 1 tablet (0.5 mg total) by mouth 2 (two) times daily as needed for anxiety., Disp: 30 tablet, Rfl: 1   atenolol (TENORMIN) 50 MG tablet, TAKE 2 TABLETS BY MOUTH AT BEDTIME (Patient taking differently: Take 100 mg by mouth daily.), Disp: 180 tablet, Rfl: 1   B Complex-C (B-COMPLEX WITH VITAMIN C) tablet, Take 1 tablet by mouth daily., Disp: , Rfl:    Blood Glucose Monitoring Suppl (FREESTYLE LITE) w/Device KIT, Use as directed to test blood glucose once to twice daily., Disp: 1 kit, Rfl: 2   cetirizine (ZYRTEC) 10 MG tablet, Take 10 mg by mouth daily., Disp: , Rfl:    cyclobenzaprine (FLEXERIL) 10 MG tablet, TAKE 1 TABLET BY MOUTH EVERY NIGHT AT BEDTIME (Patient taking differently: Take 10 mg by mouth at bedtime as needed for muscle spasms.), Disp: 30 tablet, Rfl: 0   desonide (DESOWEN) 0.05 % cream, Apply topically 2 (two) times daily., Disp: 30 g, Rfl: 3   escitalopram (LEXAPRO) 10 MG tablet, Take 1 tablet (  10 mg total) by mouth daily., Disp: 30 tablet, Rfl: 3   fluticasone (FLONASE) 50 MCG/ACT nasal spray, USE 2 SPRAYS IN EACH NOSTRIL ONCE A DAY, Disp: 16 g, Rfl: 6   furosemide (LASIX) 20 MG tablet, TAKE 1 TABLET BY MOUTH ONCE A DAY, Disp: 90 tablet, Rfl: 1   gabapentin (NEURONTIN) 300 MG capsule, TAKE 1 CAPSULE BY MOUTH EVERY NIGHT AT BEDTIME (Patient taking differently: Take 300 mg by mouth at bedtime as needed (pain).), Disp: 30 capsule, Rfl: 3   glucose blood (FREESTYLE LITE) test  strip, Use as directed to test blood glucose once to twice daily., Disp: 100 each, Rfl: 12   hydrochlorothiazide (HYDRODIURIL) 25 MG tablet, TAKE 1 TABLET BY MOUTH ONCE A DAY, Disp: 90 tablet, Rfl: 0   hydroxychloroquine (PLAQUENIL) 200 MG tablet, TAKE 1 TABLET BY MOUTH 2 TIMES DAILY, Disp: 180 tablet, Rfl: 0   Lancets (FREESTYLE) lancets, Use to test blood glucose once or twice daily., Disp: 100 each, Rfl: 12   losartan (COZAAR) 50 MG tablet, TAKE 2 TABLETS BY MOUTH ONCE A DAY, Disp: 60 tablet, Rfl: 1   metFORMIN (GLUCOPHAGE-XR) 500 MG 24 hr tablet, TAKE 1 TABLET BY MOUTH TWO TIMES DAILY WITH FOOD, Disp: 180 tablet, Rfl: 1   Omega-3 Fatty Acids (FISH OIL) 1000 MG CAPS, Take 1,000 mg by mouth daily., Disp: , Rfl:    ondansetron (ZOFRAN) 4 MG tablet, TAKE 1 TABLET BY MOUTH EVERY 6 HOURS (Patient taking differently: Take 4 mg by mouth every 6 (six) hours as needed for nausea or vomiting.), Disp: 60 tablet, Rfl: 0   pantoprazole (PROTONIX) 40 MG tablet, TAKE 1 TABLET (40 MG TOTAL) BY MOUTH DAILY., Disp: 90 tablet, Rfl: 11   Sulfacetamide Sodium-Sulfur 10-5 % FOAM, Apply 1 application topically in the morning and at bedtime., Disp: 100 g, Rfl: 1   traMADol-acetaminophen (ULTRACET) 37.5-325 MG tablet, TAKE 1 TABLET BY MOUTH EVERY 6 HOURS AS NEEDED (Patient taking differently: Take 1 tablet by mouth every 6 (six) hours as needed for moderate pain.), Disp: 30 tablet, Rfl: 0   triamcinolone cream (KENALOG) 0.1 %, Apply topically 2 (two) times daily., Disp: 60 g, Rfl: 3   ZINC-VITAMIN C PO, Take 1 tablet by mouth daily., Disp: , Rfl:    traZODone (DESYREL) 50 MG tablet, TAKE 1/2-1 TABLET BY MOUTH AT BEDTIME AS NEEDED FOR SLEEP (Patient taking differently: Take 50 mg by mouth at bedtime as needed for sleep.), Disp: 30 tablet, Rfl: 3  Allergies  Allergen Reactions   Penicillins Other (See Comments)    Childhood allergy.    Objective:   LMP 12/04/2011  AAOx3, NAD NCAT, EOMI No obvious CN  deficits Coloring WNL Pt is able to speak clearly, coherently without shortness of breath or increased work of breathing.  Thought process is linear.  Mood is appropriate.   Assessment and Plan:   Reactive airway exacerbation due to COVID- new.  Pt is outside the 5 day window for anti-viral treatment.  Will add ICS until she is feeling better and start a Prednisone taper to help w/ SOB.  Will send codeine based cough syrup for symptom relief.  Discussed need for increased fluids, rest, and the need to seek urgent attention if breathing worsens.  Pt expressed understanding and is in agreement w/ plan.    Annye Asa, MD 07/24/2021

## 2021-07-25 NOTE — Progress Notes (Deleted)
Office Visit Note  Patient: Vanessa Reeves             Date of Birth: [birth year]           MRN: [id number]             PCP: Midge Minium, MD Referring: Midge Minium, MD Visit Date: 08/08/2021 Occupation: @GUAROCC @  Subjective:  No chief complaint on file.   History of Present Illness: Vanessa Reeves is a 50 y.o. female ***   Activities of Daily Living:  Patient reports morning stiffness for *** {minute/hour:19697}.   Patient {ACTIONS;DENIES/REPORTS:21021675::"Denies"} nocturnal pain.  Difficulty dressing/grooming: {ACTIONS;DENIES/REPORTS:21021675::"Denies"} Difficulty climbing stairs: {ACTIONS;DENIES/REPORTS:21021675::"Denies"} Difficulty getting out of chair: {ACTIONS;DENIES/REPORTS:21021675::"Denies"} Difficulty using hands for taps, buttons, cutlery, and/or writing: {ACTIONS;DENIES/REPORTS:21021675::"Denies"}  No Rheumatology ROS completed.   PMFS History:  Patient Active Problem List   Diagnosis Date Noted   Polyp of transverse colon    Polyp of sigmoid colon    Polyp of rectum    Adjustment disorder with depressed mood 10/24/2020   Hearing loss of right ear 09/04/2020   Hyperlipidemia associated with type 2 diabetes mellitus ([place]) 02/21/2020   Insomnia 02/21/2020   Vitamin D deficiency 02/21/2020   Degenerative disc disease, lumbar 02/21/2020   Diabetic polyneuropathy associated with diabetes mellitus due to underlying condition ([place]) 06/14/2019   Special screening for malignant neoplasms, colon 04/11/2018   Memory changes 11/23/2017   RLS (restless legs syndrome) 03/17/2017   ADHD (attention deficit hyperactivity disorder), 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   Reactive airway disease 08/21/2016   Gastroesophageal reflux disease without esophagitis 12/20/2015   Breast mass, left 02/16/2014   Depression with anxiety 07/17/2013   Essential hypertension 07/17/2013   Eustachian tube dysfunction 07/17/2013   Adult BMI 50.0-59.9 kg/sq m ([place]) 07/17/2013     Past Medical History:  Diagnosis Date   Allergy    Anxiety    Arthritis    HANDS   Asthma    HAS MDI   Complication of anesthesia    Depression    Diabetes mellitus without complication ([place])    Difficult airway for intubation    2013 used Video Laryngoscope   GERD (gastroesophageal reflux disease)    Herpes    Hyperlipidemia    Hypertension    RLS (restless legs syndrome) 03/17/2017   Right leg    Family History  Problem Relation Age of Onset   Hypertension Mother    Clotting disorder Mother    Stroke Father    Hypertension Paternal Grandmother    Diabetes Mellitus II Paternal Grandmother    Lupus Paternal Grandmother    Healthy Daughter    Healthy Son    Colon cancer Neg Hx    Esophageal cancer Neg Hx    Pancreatic cancer Neg Hx    Stomach cancer Neg Hx    Liver disease Neg Hx    Colon polyps Neg Hx    Past Surgical History:  Procedure Laterality Date   ABDOMINAL HYSTERECTOMY     COLONOSCOPY WITH PROPOFOL N/A 07/09/2021   Procedure: COLONOSCOPY WITH PROPOFOL;  Surgeon: Mauri Pole, MD;  Location: [facility];  Service: Endoscopy;  Laterality: N/A;   DILATION AND CURETTAGE OF UTERUS     POLYPECTOMY  07/09/2021   Procedure: POLYPECTOMY;  Surgeon: Mauri Pole, MD;  Location: [facility];  Service: Endoscopy;;   TUBAL LIGATION     Social History   Social History Narrative   Lives with husband and 2 children in a 2 story  home.     Works as a Chartered certified accountant at [employer].     Highest level of education:  High school, in college now   Immunization History  Administered Date(s) Administered   Influenza,inj,Quad PF,6+ Mos 07/17/2013, 07/27/2014, 07/26/2015, 08/21/2016, 07/06/2017, 07/29/2018, 07/03/2019   Influenza-Unspecified 08/20/2020   MMR 03/08/2017, 04/05/2017   Moderna Sars-Covid-2 Vaccination 06/06/2020, 07/04/2020   PPD Test 07/26/2015, 03/07/2018, 05/23/2018   Pneumococcal Polysaccharide-23 01/16/2015   Tdap 10/14/2015      Objective: Vital Signs: LMP 12/04/2011    Physical Exam   Musculoskeletal Exam: ***  CDAI Exam: CDAI Score: -- Patient Global: --; Provider Global: -- Swollen: --; Tender: -- Joint Exam 08/08/2021   No joint exam has been documented for this visit   There is currently no information documented on the homunculus. Go to the Rheumatology activity and complete the homunculus joint exam.  Investigation: No additional findings.  Imaging: No results found.  Recent Labs: Lab Results  Component Value Date   WBC 5.1 04/21/2021   HGB 12.6 04/21/2021   PLT 380.0 04/21/2021   NA 139 04/21/2021   K 4.0 04/21/2021   CL 102 04/21/2021   CO2 27 04/21/2021   GLUCOSE 89 04/21/2021   BUN 11 04/21/2021   CREATININE 0.83 04/21/2021   BILITOT 0.4 04/21/2021   ALKPHOS 86 04/21/2021   AST 13 04/21/2021   ALT 12 04/21/2021   PROT 7.6 04/21/2021   ALBUMIN 4.1 04/21/2021   CALCIUM 9.6 04/21/2021   GFRAA 74 12/16/2020   QFTBGOLDPLUS NEGATIVE 07/03/2019    Speciality Comments: PLQ Eye Exam: 08/26/2020 WNL @ [facility] Follow up 05/2021  Procedures:  No procedures performed Allergies: Penicillins   Assessment / Plan:     Visit Diagnoses: No diagnosis found.  Orders: No orders of the defined types were placed in this encounter.  No orders of the defined types were placed in this encounter.   Face-to-face time spent with patient was *** minutes. Greater than 50% of time was spent in counseling and coordination of care.  Follow-Up Instructions: No follow-ups on file.   Earnestine Mealing, CMA  Note - This record has been created using Editor, commissioning.  Chart creation errors have been sought, but may not always  have been located. Such creation errors do not reflect on  the standard of medical care.

## 2021-08-04 ENCOUNTER — Other Ambulatory Visit (HOSPITAL_COMMUNITY): Payer: Self-pay

## 2021-08-04 ENCOUNTER — Other Ambulatory Visit: Payer: Self-pay | Admitting: Family Medicine

## 2021-08-05 ENCOUNTER — Other Ambulatory Visit (HOSPITAL_COMMUNITY): Payer: Self-pay

## 2021-08-05 ENCOUNTER — Encounter: Payer: Self-pay | Admitting: Family Medicine

## 2021-08-05 NOTE — Telephone Encounter (Signed)
Pt is requesting you refill facial cleanser as the foam is not being covered by insurance can you please refill this as I am unsure which Rx this is related to thank you!

## 2021-08-07 ENCOUNTER — Other Ambulatory Visit (HOSPITAL_COMMUNITY): Payer: Self-pay

## 2021-08-07 MED ORDER — SULFACETAMIDE SODIUM-SULFUR 10-5 % EX LIQD
CUTANEOUS | 3 refills | Status: DC
  Filled 2021-08-07: qty 170, 30d supply, fill #0
  Filled 2021-08-07: qty 473, 30d supply, fill #0
  Filled 2021-12-12: qty 170, 30d supply, fill #1
  Filled 2022-04-20: qty 170, 30d supply, fill #2
  Filled 2022-07-01: qty 170, 30d supply, fill #3

## 2021-08-08 ENCOUNTER — Ambulatory Visit: Payer: 59 | Admitting: Physician Assistant

## 2021-08-08 ENCOUNTER — Other Ambulatory Visit (HOSPITAL_COMMUNITY): Payer: Self-pay

## 2021-08-08 DIAGNOSIS — Z87891 Personal history of nicotine dependence: Secondary | ICD-10-CM

## 2021-08-08 DIAGNOSIS — K219 Gastro-esophageal reflux disease without esophagitis: Secondary | ICD-10-CM

## 2021-08-08 DIAGNOSIS — F418 Other specified anxiety disorders: Secondary | ICD-10-CM

## 2021-08-08 DIAGNOSIS — F900 Attention-deficit hyperactivity disorder, predominantly inattentive type: Secondary | ICD-10-CM

## 2021-08-08 DIAGNOSIS — E559 Vitamin D deficiency, unspecified: Secondary | ICD-10-CM

## 2021-08-08 DIAGNOSIS — G2581 Restless legs syndrome: Secondary | ICD-10-CM

## 2021-08-08 DIAGNOSIS — I10 Essential (primary) hypertension: Secondary | ICD-10-CM

## 2021-08-08 DIAGNOSIS — M359 Systemic involvement of connective tissue, unspecified: Secondary | ICD-10-CM

## 2021-08-08 DIAGNOSIS — M79641 Pain in right hand: Secondary | ICD-10-CM

## 2021-08-08 DIAGNOSIS — M47819 Spondylosis without myelopathy or radiculopathy, site unspecified: Secondary | ICD-10-CM

## 2021-08-08 DIAGNOSIS — R21 Rash and other nonspecific skin eruption: Secondary | ICD-10-CM

## 2021-08-08 DIAGNOSIS — M5136 Other intervertebral disc degeneration, lumbar region: Secondary | ICD-10-CM

## 2021-08-08 DIAGNOSIS — R5383 Other fatigue: Secondary | ICD-10-CM

## 2021-08-08 DIAGNOSIS — E0842 Diabetes mellitus due to underlying condition with diabetic polyneuropathy: Secondary | ICD-10-CM

## 2021-08-08 DIAGNOSIS — G8929 Other chronic pain: Secondary | ICD-10-CM

## 2021-08-08 DIAGNOSIS — Z79899 Other long term (current) drug therapy: Secondary | ICD-10-CM

## 2021-08-08 DIAGNOSIS — J4541 Moderate persistent asthma with (acute) exacerbation: Secondary | ICD-10-CM

## 2021-08-11 ENCOUNTER — Other Ambulatory Visit (HOSPITAL_COMMUNITY): Payer: Self-pay

## 2021-08-13 ENCOUNTER — Other Ambulatory Visit (HOSPITAL_COMMUNITY): Payer: Self-pay

## 2021-08-26 ENCOUNTER — Other Ambulatory Visit: Payer: Self-pay | Admitting: Specialist

## 2021-08-26 ENCOUNTER — Other Ambulatory Visit: Payer: Self-pay | Admitting: Family Medicine

## 2021-08-26 ENCOUNTER — Other Ambulatory Visit: Payer: Self-pay | Admitting: Surgery

## 2021-08-26 ENCOUNTER — Other Ambulatory Visit: Payer: Self-pay | Admitting: Rheumatology

## 2021-08-26 ENCOUNTER — Other Ambulatory Visit (HOSPITAL_COMMUNITY): Payer: Self-pay

## 2021-08-26 DIAGNOSIS — M5136 Other intervertebral disc degeneration, lumbar region: Secondary | ICD-10-CM

## 2021-08-26 MED ORDER — ONDANSETRON HCL 4 MG PO TABS
ORAL_TABLET | Freq: Four times a day (QID) | ORAL | 0 refills | Status: DC
  Filled 2021-08-26: qty 60, 15d supply, fill #0

## 2021-08-26 MED ORDER — TRAMADOL-ACETAMINOPHEN 37.5-325 MG PO TABS
1.0000 | ORAL_TABLET | Freq: Four times a day (QID) | ORAL | 0 refills | Status: DC | PRN
  Filled 2021-08-26: qty 30, 8d supply, fill #0

## 2021-08-26 MED ORDER — ATENOLOL 50 MG PO TABS
ORAL_TABLET | Freq: Every day | ORAL | 1 refills | Status: DC
  Filled 2021-08-26: qty 180, fill #0
  Filled 2021-10-27 – 2021-10-28 (×2): qty 180, 90d supply, fill #0
  Filled 2021-12-12 – 2022-01-19 (×2): qty 180, 90d supply, fill #1

## 2021-08-26 MED ORDER — CYCLOBENZAPRINE HCL 10 MG PO TABS
ORAL_TABLET | Freq: Every day | ORAL | 0 refills | Status: DC
  Filled 2021-08-26: qty 30, 30d supply, fill #0

## 2021-08-26 MED ORDER — TRAZODONE HCL 50 MG PO TABS
ORAL_TABLET | ORAL | 3 refills | Status: DC
  Filled 2021-08-26: qty 30, 30d supply, fill #0
  Filled 2021-09-16: qty 30, 30d supply, fill #1

## 2021-08-26 MED ORDER — HYDROCHLOROTHIAZIDE 25 MG PO TABS
ORAL_TABLET | Freq: Every day | ORAL | 0 refills | Status: DC
  Filled 2021-08-26: qty 90, 90d supply, fill #0

## 2021-08-26 MED FILL — Gabapentin Cap 300 MG: ORAL | 30 days supply | Qty: 30 | Fill #1 | Status: AC

## 2021-08-26 NOTE — Telephone Encounter (Signed)
Next Visit: 09/02/2021  Last Visit: 05/07/2021  Last Fill: 05/07/2021  Dx: Autoimmune disease   Current Dose per office note on 05/07/2021: She gets nausea with Plaquenil use.  She takes Zofran intermittently  Okay to refill Zofran?

## 2021-08-29 NOTE — Progress Notes (Deleted)
Office Visit Note  Patient: Vanessa Reeves             Date of Birth: 03/08/1971           MRN: [id number]             PCP: Midge Minium, MD Referring: Midge Minium, MD Visit Date: 09/02/2021 Occupation: @GUAROCC @  Subjective:   History of Present Illness: Vanessa Reeves is a 50 y.o. female with history of autoimmune disease and DDD. She is taking plaquenil 200 mg 1 tablet by mouth twice daily.   Lab work from 12/16/20 was reviewed today in the office: RF-, Ro-, ANA 1:80NH, 1:80NS, dsDNA 1, ESR 55, C4 elevated-64, C3 WNL, and SPEP did not reveal any abnormal protein bands. She is due to update the following lab work today. Orders released.   CBC, hepatic function panel, and BMP updated on 04/21/21.  She is due to update lab work today.  Orders for CBC and CMP released.   PLQ eye exam???   Activities of Daily Living:  Patient reports morning stiffness for *** {minute/hour:19697}.   Patient {ACTIONS;DENIES/REPORTS:21021675::"Denies"} nocturnal pain.  Difficulty dressing/grooming: {ACTIONS;DENIES/REPORTS:21021675::"Denies"} Difficulty climbing stairs: {ACTIONS;DENIES/REPORTS:21021675::"Denies"} Difficulty getting out of chair: {ACTIONS;DENIES/REPORTS:21021675::"Denies"} Difficulty using hands for taps, buttons, cutlery, and/or writing: {ACTIONS;DENIES/REPORTS:21021675::"Denies"}  No Rheumatology ROS completed.   PMFS History:  Patient Active Problem List   Diagnosis Date Noted   Polyp of transverse colon    Polyp of sigmoid colon    Polyp of rectum    Adjustment disorder with depressed mood 10/24/2020   Hearing loss of right ear 09/04/2020   Hyperlipidemia associated with type 2 diabetes mellitus ([place]) 02/21/2020   Insomnia 02/21/2020   Vitamin D deficiency 02/21/2020   Degenerative disc disease, lumbar 02/21/2020   Diabetic polyneuropathy associated with diabetes mellitus due to underlying condition ([place]) 06/14/2019   Special screening for malignant  neoplasms, colon 04/11/2018   Memory changes 11/23/2017   RLS (restless legs syndrome) 03/17/2017   ADHD (attention deficit hyperactivity disorder), 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   Reactive airway disease 08/21/2016   Gastroesophageal reflux disease without esophagitis 12/20/2015   Breast mass, left 02/16/2014   Depression with anxiety 07/17/2013   Essential hypertension 07/17/2013   Eustachian tube dysfunction 07/17/2013   Adult BMI 50.0-59.9 kg/sq m ([place]) 07/17/2013    Past Medical History:  Diagnosis Date   Allergy    Anxiety    Arthritis    HANDS   Asthma    HAS MDI   Complication of anesthesia    Depression    Diabetes mellitus without complication ([place])    Difficult airway for intubation    2013 used Video Laryngoscope   GERD (gastroesophageal reflux disease)    Herpes    Hyperlipidemia    Hypertension    RLS (restless legs syndrome) 03/17/2017   Right leg    Family History  Problem Relation Age of Onset   Hypertension Mother    Clotting disorder Mother    Stroke Father    Hypertension Paternal Grandmother    Diabetes Mellitus II Paternal Grandmother    Lupus Paternal Grandmother    Healthy Daughter    Healthy Son    Colon cancer Neg Hx    Esophageal cancer Neg Hx    Pancreatic cancer Neg Hx    Stomach cancer Neg Hx    Liver disease Neg Hx    Colon polyps Neg Hx    Past Surgical History:  Procedure Laterality Date   ABDOMINAL  HYSTERECTOMY     COLONOSCOPY WITH PROPOFOL N/A 07/09/2021   Procedure: COLONOSCOPY WITH PROPOFOL;  Surgeon: Mauri Pole, MD;  [facility];  Service: Endoscopy;  Laterality: N/A;   DILATION AND CURETTAGE OF UTERUS     POLYPECTOMY  07/09/2021   Procedure: POLYPECTOMY;  Surgeon: Mauri Pole, MD;  [facility];  Service: Endoscopy;;   TUBAL LIGATION     Social History   Social History Narrative   Lives with husband and 2 children in a 2 story home.     Works as a Chartered certified accountant at [employer].      Highest level of education:  High school, in college now   Immunization History  Administered Date(s) Administered   Influenza,inj,Quad PF,6+ Mos 07/17/2013, 07/27/2014, 07/26/2015, 08/21/2016, 07/06/2017, 07/29/2018, 07/03/2019   Influenza-Unspecified 08/20/2020   MMR 03/08/2017, 04/05/2017   Moderna Sars-Covid-2 Vaccination 06/06/2020, 07/04/2020   PPD Test 07/26/2015, 03/07/2018, 05/23/2018   Pneumococcal Polysaccharide-23 01/16/2015   Tdap 10/14/2015     Objective: Vital Signs: LMP 12/04/2011    Physical Exam Vitals and nursing note reviewed.  Constitutional:      Appearance: She is well-developed.  HENT:     Head: Normocephalic and atraumatic.  Eyes:     Conjunctiva/sclera: Conjunctivae normal.  Pulmonary:     Effort: Pulmonary effort is normal.  Abdominal:     Palpations: Abdomen is soft.  Musculoskeletal:     Cervical back: Normal range of motion.  Skin:    General: Skin is warm and dry.     Capillary Refill: Capillary refill takes less than 2 seconds.  Neurological:     Mental Status: She is alert and oriented to person, place, and time.  Psychiatric:        Behavior: Behavior normal.     Musculoskeletal Exam: ***  CDAI Exam: CDAI Score: -- Patient Global: --; Provider Global: -- Swollen: --; Tender: -- Joint Exam 09/02/2021   No joint exam has been documented for this visit   There is currently no information documented on the homunculus. Go to the Rheumatology activity and complete the homunculus joint exam.  Investigation: No additional findings.  Imaging: No results found.  Recent Labs: Lab Results  Component Value Date   WBC 5.1 04/21/2021   HGB 12.6 04/21/2021   PLT 380.0 04/21/2021   NA 139 04/21/2021   K 4.0 04/21/2021   CL 102 04/21/2021   CO2 27 04/21/2021   GLUCOSE 89 04/21/2021   BUN 11 04/21/2021   CREATININE 0.83 04/21/2021   BILITOT 0.4 04/21/2021   ALKPHOS 86 04/21/2021   AST 13 04/21/2021   ALT 12 04/21/2021   PROT 7.6  04/21/2021   ALBUMIN 4.1 04/21/2021   CALCIUM 9.6 04/21/2021   GFRAA 74 12/16/2020   QFTBGOLDPLUS NEGATIVE 07/03/2019    Speciality Comments: PLQ Eye Exam: 08/26/2020 WNL @ [place] [facility] Follow up 05/2021  Procedures:  No procedures performed Allergies: Penicillins   Assessment / Plan:     Visit Diagnoses: No diagnosis found.  Orders: No orders of the defined types were placed in this encounter.  No orders of the defined types were placed in this encounter.   Face-to-face time spent with patient was *** minutes. Greater than 50% of time was spent in counseling and coordination of care.  Follow-Up Instructions: No follow-ups on file.   Earnestine Mealing, CMA  Note - This record has been created using Editor, commissioning.  Chart creation errors have been sought, but may  not always  have been located. Such creation errors do not reflect on  the standard of medical care.

## 2021-09-02 ENCOUNTER — Other Ambulatory Visit: Payer: Self-pay | Admitting: Family Medicine

## 2021-09-02 ENCOUNTER — Ambulatory Visit: Payer: 59 | Admitting: Physician Assistant

## 2021-09-02 DIAGNOSIS — Z79899 Other long term (current) drug therapy: Secondary | ICD-10-CM

## 2021-09-02 DIAGNOSIS — G2581 Restless legs syndrome: Secondary | ICD-10-CM

## 2021-09-02 DIAGNOSIS — G8929 Other chronic pain: Secondary | ICD-10-CM

## 2021-09-02 DIAGNOSIS — M5136 Other intervertebral disc degeneration, lumbar region: Secondary | ICD-10-CM

## 2021-09-02 DIAGNOSIS — R21 Rash and other nonspecific skin eruption: Secondary | ICD-10-CM

## 2021-09-02 DIAGNOSIS — E0842 Diabetes mellitus due to underlying condition with diabetic polyneuropathy: Secondary | ICD-10-CM

## 2021-09-02 DIAGNOSIS — F900 Attention-deficit hyperactivity disorder, predominantly inattentive type: Secondary | ICD-10-CM

## 2021-09-02 DIAGNOSIS — R5383 Other fatigue: Secondary | ICD-10-CM

## 2021-09-02 DIAGNOSIS — M47819 Spondylosis without myelopathy or radiculopathy, site unspecified: Secondary | ICD-10-CM

## 2021-09-02 DIAGNOSIS — J4541 Moderate persistent asthma with (acute) exacerbation: Secondary | ICD-10-CM

## 2021-09-02 DIAGNOSIS — K219 Gastro-esophageal reflux disease without esophagitis: Secondary | ICD-10-CM

## 2021-09-02 DIAGNOSIS — F418 Other specified anxiety disorders: Secondary | ICD-10-CM

## 2021-09-02 DIAGNOSIS — M359 Systemic involvement of connective tissue, unspecified: Secondary | ICD-10-CM

## 2021-09-02 DIAGNOSIS — E559 Vitamin D deficiency, unspecified: Secondary | ICD-10-CM

## 2021-09-02 DIAGNOSIS — I10 Essential (primary) hypertension: Secondary | ICD-10-CM

## 2021-09-02 DIAGNOSIS — Z87891 Personal history of nicotine dependence: Secondary | ICD-10-CM

## 2021-09-02 DIAGNOSIS — M79641 Pain in right hand: Secondary | ICD-10-CM

## 2021-09-02 NOTE — Telephone Encounter (Signed)
Please advise, I know prednisone is usually only for a short time

## 2021-09-02 NOTE — Progress Notes (Signed)
Office Visit Note  Patient: Vanessa Reeves             Date of Birth: Mar 11, 1971           MRN: [id number]             PCP: Midge Minium, MD Referring: Midge Minium, MD Visit Date: 09/16/2021 Occupation: @GUAROCC @  Subjective:  Pain in multiple joints   History of Present Illness: Vanessa Reeves is a 50 y.o. female with history of autoimmune disease. She is taking plaquenil 200 mg 1 tablet by mouth twice daily.  She is tolerating Plaquenil without any side effects.  She has not missed any doses of Plaquenil recently.  She states that yesterday she started to have significant arthralgias and myalgias with no identifiable trigger.  She has been experiencing swelling and stiffness in both hands.  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  She has tried taking Tylenol with minimal pain relief.  She denies any recent rashes or increased photosensitivity.  She has noticed increased hair loss. She has intermittent symptoms of raynaud's.  She denies any oral or nasal ulcerations.  She has ongoing sicca symptoms.  She denies any cervical adenopathy or fevers.  She has not had any shortness of breath, pleuritic chest pain, or palpitations.  She denies any other new concerns.    Activities of Daily Living:  Patient reports morning stiffness for all day.  Patient Reports nocturnal pain.  Difficulty dressing/grooming: Reports Difficulty climbing stairs: Reports Difficulty getting out of chair: Reports Difficulty using hands for taps, buttons, cutlery, and/or writing: Reports  Review of Systems  Constitutional:  Positive for fatigue.  HENT:  Positive for mouth dryness. Negative for mouth sores and nose dryness.   Eyes:  Positive for dryness. Negative for pain and itching.  Respiratory:  Negative for shortness of breath and difficulty breathing.    Cardiovascular:  Negative for chest pain and palpitations.  Gastrointestinal:  Negative for blood in stool, constipation and diarrhea.  Endocrine: Negative for increased urination.  Genitourinary:  Negative for difficulty urinating.  Musculoskeletal:  Positive for joint pain, joint pain, joint swelling, myalgias, morning stiffness, muscle tenderness and myalgias.  Skin:  Positive for color change. Negative for rash and redness.  Allergic/Immunologic: Negative for susceptible to infections.  Neurological:  Positive for numbness, parasthesias and weakness. Negative for dizziness, headaches and memory loss.  Hematological:  Positive for bruising/bleeding tendency.  Psychiatric/Behavioral:  Positive for sleep disturbance. Negative for confusion.    PMFS History:  Patient Active Problem List   Diagnosis Date Noted   Polyp of transverse colon    Polyp of sigmoid colon    Polyp of rectum    Adjustment disorder with depressed mood 10/24/2020   Hearing loss of right ear 09/04/2020   Hyperlipidemia associated with type 2 diabetes mellitus ([place]) 02/21/2020   Insomnia 02/21/2020   Vitamin D deficiency 02/21/2020   Degenerative disc disease, lumbar 02/21/2020   Diabetic polyneuropathy associated with diabetes mellitus due to underlying condition ([place]) 06/14/2019   Special screening for malignant neoplasms, colon 04/11/2018   Memory changes 11/23/2017   RLS (restless legs syndrome) 03/17/2017   ADHD (attention deficit hyperactivity disorder), 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   Reactive airway disease 08/21/2016   Gastroesophageal reflux disease without esophagitis 12/20/2015   Breast mass, left 02/16/2014  Depression with anxiety 07/17/2013   Essential hypertension 07/17/2013   Eustachian tube dysfunction 07/17/2013   Adult BMI 50.0-59.9 kg/sq m ([place]) 07/17/2013    Past Medical History:  Diagnosis Date   Allergy    Anxiety    Arthritis    HANDS   Asthma    HAS MDI   Complication of  anesthesia    Depression    Diabetes mellitus without complication ([place])    Difficult airway for intubation    2013 used Video Laryngoscope   GERD (gastroesophageal reflux disease)    Herpes    Hyperlipidemia    Hypertension    RLS (restless legs syndrome) 03/17/2017   Right leg    Family History  Problem Relation Age of Onset   Hypertension Mother    Clotting disorder Mother    Stroke Father    Hypertension Paternal Grandmother    Diabetes Mellitus II Paternal Grandmother    Lupus Paternal Grandmother    Healthy Daughter    Healthy Son    Colon cancer Neg Hx    Esophageal cancer Neg Hx    Pancreatic cancer Neg Hx    Stomach cancer Neg Hx    Liver disease Neg Hx    Colon polyps Neg Hx    Past Surgical History:  Procedure Laterality Date   ABDOMINAL HYSTERECTOMY     COLONOSCOPY WITH PROPOFOL N/A 07/09/2021   Procedure: COLONOSCOPY WITH PROPOFOL;  Surgeon: Mauri Pole, MD;  Location: [facility];  Service: Endoscopy;  Laterality: N/A;   DILATION AND CURETTAGE OF UTERUS     POLYPECTOMY  07/09/2021   Procedure: POLYPECTOMY;  Surgeon: Mauri Pole, MD;  Location: [facility];  Service: Endoscopy;;   TUBAL LIGATION     Social History   Social History Narrative   Lives with husband and 2 children in a 2 story home.     Works as a Chartered certified accountant at [employer].     Highest level of education:  High school, in college now   Immunization History  Administered Date(s) Administered   Influenza,inj,Quad PF,6+ Mos 07/17/2013, 07/27/2014, 07/26/2015, 08/21/2016, 07/06/2017, 07/29/2018, 07/03/2019   Influenza-Unspecified 08/20/2020, 08/19/2021   MMR 03/08/2017, 04/05/2017   Moderna Sars-Covid-2 Vaccination 06/06/2020, 07/04/2020   PPD Test 07/26/2015, 03/07/2018, 05/23/2018   Pneumococcal Polysaccharide-23 01/16/2015   Tdap 10/14/2015     Objective: Vital Signs: BP (!) 169/106 (BP Location: Left Wrist, Patient Position: Sitting, Cuff Size: Normal)   Pulse 72   Ht  $R'5\' 5"'mi$  (1.651 m)   Wt (!) 307 lb 12.8 oz (139.6 kg)   LMP 12/04/2011   BMI 51.22 kg/m    Physical Exam Vitals and nursing note reviewed.  Constitutional:      Appearance: She is well-developed.  HENT:     Head: Normocephalic and atraumatic.  Eyes:     Conjunctiva/sclera: Conjunctivae normal.  Cardiovascular:     Rate and Rhythm: Normal rate and regular rhythm.     Heart sounds: Normal heart sounds.  Pulmonary:     Effort: Pulmonary effort is normal.     Breath sounds: Normal breath sounds.  Abdominal:     General: Bowel sounds are normal.     Palpations: Abdomen is soft.  Musculoskeletal:     Cervical back: Normal range of motion.  Lymphadenopathy:     Cervical: No cervical adenopathy.  Skin:    General: Skin is warm and dry.     Capillary Refill: Capillary refill takes less than 2 seconds.  Neurological:  Mental Status: She is alert and oriented to person, place, and time.  Psychiatric:        Behavior: Behavior normal.     Musculoskeletal Exam: C-spine, thoracic spine, lumbar spine good range of motion with no discomfort.  Some thoracic spine pain noted.  No SI joint discomfort.  Shoulder joints, elbow joints, wrist joints, MCPs, PIPs, DIPs have good range of motion with no synovitis.  She has tenderness over all PIP and DIP joints.  Tenderness over the second and third MCP joints bilaterally.  Complete fist formation bilaterally.  Hip joints have good range of motion with no groin pain.  Tenderness over bilateral trochanteric bursa.  Knee joints have good ROM with no discomfort.  No warmth or effusion of knee joints.  No tenderness or joint swelling of ankle joints.    CDAI Exam: CDAI Score: -- Patient Global: --; Provider Global: -- Swollen: 0 ; Tender: 19  Joint Exam 09/16/2021      Right  Left  Wrist   Tender     MCP 2   Tender   Tender  MCP 3   Tender   Tender  PIP 2   Tender   Tender  PIP 3   Tender   Tender  PIP 4   Tender   Tender  DIP 2   Tender    Tender  DIP 3   Tender   Tender  DIP 4   Tender   Tender  DIP 5   Tender   Tender   There is currently no information documented on the homunculus. Go to the Rheumatology activity and complete the homunculus joint exam.  Investigation: No additional findings.  Imaging: No results found.  Recent Labs: Lab Results  Component Value Date   WBC 5.1 04/21/2021   HGB 12.6 04/21/2021   PLT 380.0 04/21/2021   NA 139 04/21/2021   K 4.0 04/21/2021   CL 102 04/21/2021   CO2 27 04/21/2021   GLUCOSE 89 04/21/2021   BUN 11 04/21/2021   CREATININE 0.83 04/21/2021   BILITOT 0.4 04/21/2021   ALKPHOS 86 04/21/2021   AST 13 04/21/2021   ALT 12 04/21/2021   PROT 7.6 04/21/2021   ALBUMIN 4.1 04/21/2021   CALCIUM 9.6 04/21/2021   GFRAA 74 12/16/2020   QFTBGOLDPLUS NEGATIVE 07/03/2019    Speciality Comments: PLQ Eye Exam: 08/26/2020 WNL @ [place] Follow up 05/2021  Procedures:  No procedures performed Allergies: Penicillins      Assessment / Plan:     Visit Diagnoses: Autoimmune disease ([place]) - AVISE+ANA, +APS IgM positive: She presents today experiencing increased arthralgias, joint stiffness, and myalgias which started yesterday.  She is having severe pain in both hands and had difficulty sleeping last night due to nocturnal pain.  No obvious synovitis was noted on examination today.  X-rays of both hands were updated.  Discussed that if she continues to have increased joint pain and stiffness we can schedule an ultrasound of both hands to assess for synovitis.  She has not had any recent rashes or increased photosensitivity.  No Maller rash was noted on examination today.  She has noticed some increased hair thinning so a referral to dermatology was placed today.  She has been experiencing intermittent symptoms of Raynaud's especially with colder weather temperatures but no digital ulcerations or signs of sclerodactyly were noted.  She no cervical lymphadenopathy and has not had  any recent fevers.  No oral or nasal ulcerations noted.  She continues  to have chronic sicca symptoms so the use of over-the-counter products were discussed.  She has not had any shortness of breath and her lungs were clear to auscultation on examination today.  She has not had any palpitations and her heart rate was regular rate and rhythm. Lab work from 12/16/20 was reviewed today in the office: RF<14, Ro<1, ANA 1:80 NH, 1:80NS, C4 64 and C3 184, dsDNA 1, and ESR 55.  She is due to update lab work today.  Orders were released.  She will remain on Plaquenil as prescribed.  Hydroxychloroquine drug level will be obtained today.  She was advised to notify us if she continues to have persistent joint pain and inflammation and we can schedule an ultrasound of both hands for further evaluation.  She will follow-up in the office in 3 months. - Plan: CBC with Differential/Platelet, COMPLETE METABOLIC PANEL WITH GFR, ANA, Sedimentation rate, C3 and C4, Anti-DNA antibody, double-stranded, Protein / creatinine ratio, urine, Beta-2 glycoprotein antibodies, Cardiolipin antibodies, IgG, IgM, IgA, Lupus Anticoagulant Eval w/Reflex, Hydroxychloroquine, Blood, hydroxychloroquine (PLAQUENIL) 200 MG tablet  High risk medication use - Plaquenil 200 mg 1 tablet by mouth twice daily.  She has not missed any doses of Plaquenil recently.  She gets nausea with Plaquenil use, and she takes Zofran intermittently. PLQ Eye Exam: 08/26/2020 WNL @ [facility].  She is overdue to update PLQ eye exam.  Hydroxychloroquine blood level was ordered today.  CBC, hepatic function panel, and BMP drawn on 04/21/2021.  CBC and CMP were also updated today to monitor for drug toxicity.  She will continue to require lab work every 5 months.- Plan: CBC with Differential/Platelet, COMPLETE METABOLIC PANEL WITH GFR, Hydroxychloroquine, Blood She has not had any recent infections.  Pain in both hands - She presents today with increased pain in both  hands.  She has been experiencing increased stiffness for the past 2 days.  She difficulty sleeping last night due to nocturnal pain.  She tried taking Tylenol with minimal pain relief.  She had tenderness over PIP and DIP joints as well as tenderness over the right wrist and bilateral second and third MCP joints.  X-rays of both hands updated today. ESR updated today.  Discussed that if her symptoms persist or worsen we can schedule an ultrasound of both hands to assess for synovitis.  She will remain on Plaquenil as prescribed.  Plan: XR Hand 2 View Left, XR Hand 2 View Right  Hair loss: Hair thinning noted on scalp.  Referral to dermatology was placed today for further evaluation and recommendations. Discussed starting OTC biotin daily.   Rash: Resolved.  No recurrence.   Family history of blood clots -Positive history of blood clots in mother and maternal grandmother.  The following antibodies will be checked today.  Plan: Beta-2 glycoprotein antibodies, Cardiolipin antibodies, IgG, IgM, IgA, Lupus Anticoagulant Eval w/Reflex  Other fatigue: She continues to experience fatigue intermittently.  Discussed the importance of regular exercise and good sleep hygiene.   Chronic SI joint pain: Chronic pain.  Followed by Dr. Louanne Skye.   DDD (degenerative disc disease), lumbar: Followed by Dr. Lenis Noon of lumbar spine orderded on 10/07/20: Broad right posterior lateral protrusion T12-L1 with minimal distortion of the anterior cord, no significant spinal stenosis.  Moderate facet DJD L4-L5 with mild grade 1 anterolisthesis, no definite dynamic instability on standing flexion or extension radiographs.  Mild disc bulge at L5-S1 without compressive pathology.  She is not a good candidate for surgery at this time  due to BMI.  Patient was referred to Dr. Ernestina Patches to discuss lumbar epidural steroid injections.   Facet arthropathy: Chronic pain   Vitamin D deficiency  Other medical conditions are listed as follows:    Essential hypertension: Discussed the importance of close blood pressure monitoring   Diabetic polyneuropathy associated with diabetes mellitus due to underlying condition [facility]) -Patient requested to have hemoglobin A1c checked today and to have results forwarded to Dr. Birdie Riddle.  Plan: HgB A1c  Lipid screening -Patient requested to have lipid panel checked today.  She is currently fasting.  She would like results forwarded to Dr. Birdie Riddle.  Plan: Lipid panel  Gastroesophageal reflux disease without esophagitis  ADHD (attention deficit hyperactivity disorder), inattentive type  RLS (restless legs syndrome)  Depression with anxiety  Former smoker  Moderate persistent reactive airway disease with acute exacerbation    Orders: Orders Placed This Encounter  Procedures   XR Hand 2 View Left   XR Hand 2 View Right   CBC with Differential/Platelet   COMPLETE METABOLIC PANEL WITH GFR   ANA   Sedimentation rate   C3 and C4   Anti-DNA antibody, double-stranded   Protein / creatinine ratio, urine   Beta-2 glycoprotein antibodies   Cardiolipin antibodies, IgG, IgM, IgA   Lupus Anticoagulant Eval w/Reflex   Hydroxychloroquine, Blood   Lipid panel   HgB A1c   Ambulatory referral to Dermatology   Meds ordered this encounter  Medications   hydroxychloroquine (PLAQUENIL) 200 MG tablet    Sig: TAKE 1 TABLET BY MOUTH 2 TIMES DAILY    Dispense:  180 tablet    Refill:  0     Follow-Up Instructions: Return in about 3 months (around 12/17/2021) for Autoimmune Disease.   Ofilia Neas, PA-C  Note - This record has been created using Dragon software.  Chart creation errors have been sought, but may not always  have been located. Such creation errors do not reflect on  the standard of medical care.

## 2021-09-03 ENCOUNTER — Other Ambulatory Visit (HOSPITAL_COMMUNITY): Payer: Self-pay

## 2021-09-03 ENCOUNTER — Other Ambulatory Visit: Payer: Self-pay | Admitting: Family Medicine

## 2021-09-03 MED ORDER — ALBUTEROL SULFATE HFA 108 (90 BASE) MCG/ACT IN AERS
2.0000 | INHALATION_SPRAY | Freq: Four times a day (QID) | RESPIRATORY_TRACT | 0 refills | Status: DC | PRN
  Filled 2021-09-03: qty 18, 25d supply, fill #0

## 2021-09-04 ENCOUNTER — Ambulatory Visit: Payer: 59 | Admitting: Family Medicine

## 2021-09-04 ENCOUNTER — Other Ambulatory Visit (HOSPITAL_BASED_OUTPATIENT_CLINIC_OR_DEPARTMENT_OTHER): Payer: Self-pay | Admitting: Family Medicine

## 2021-09-04 ENCOUNTER — Other Ambulatory Visit (HOSPITAL_COMMUNITY): Payer: Self-pay

## 2021-09-04 ENCOUNTER — Other Ambulatory Visit: Payer: Self-pay

## 2021-09-04 ENCOUNTER — Telehealth (INDEPENDENT_AMBULATORY_CARE_PROVIDER_SITE_OTHER): Payer: 59 | Admitting: Family Medicine

## 2021-09-04 ENCOUNTER — Encounter: Payer: Self-pay | Admitting: Family Medicine

## 2021-09-04 VITALS — BP 146/84 | HR 76 | Temp 98.2°F | Resp 20 | Ht 65.0 in | Wt 319.0 lb

## 2021-09-04 DIAGNOSIS — E0842 Diabetes mellitus due to underlying condition with diabetic polyneuropathy: Secondary | ICD-10-CM

## 2021-09-04 DIAGNOSIS — I10 Essential (primary) hypertension: Secondary | ICD-10-CM

## 2021-09-04 DIAGNOSIS — Z6841 Body Mass Index (BMI) 40.0 and over, adult: Secondary | ICD-10-CM | POA: Diagnosis not present

## 2021-09-04 DIAGNOSIS — J069 Acute upper respiratory infection, unspecified: Secondary | ICD-10-CM

## 2021-09-04 DIAGNOSIS — Z1231 Encounter for screening mammogram for malignant neoplasm of breast: Secondary | ICD-10-CM

## 2021-09-04 MED ORDER — GUAIFENESIN-CODEINE 100-10 MG/5ML PO SYRP
10.0000 mL | ORAL_SOLUTION | Freq: Three times a day (TID) | ORAL | 0 refills | Status: DC | PRN
  Filled 2021-09-04: qty 120, 4d supply, fill #0

## 2021-09-04 MED ORDER — PREDNISONE 10 MG PO TABS
ORAL_TABLET | ORAL | 0 refills | Status: DC
  Filled 2021-09-04: qty 18, 9d supply, fill #0

## 2021-09-04 NOTE — Progress Notes (Signed)
Virtual Visit via Video   I connected with patient on 09/04/21 at  1:00 PM EDT by a video enabled telemedicine application and verified that I am speaking with the correct person using two identifiers.  Location patient: Home Location provider: [facility], Office Persons participating in the virtual visit: Patient, Provider, [place] Claiborne Billings C)  I discussed the limitations of evaluation and management by telemedicine and the availability of in person appointments. The patient expressed understanding and agreed to proceed.  Subjective:   HPI:   HTN- chronic problem, on Atenolol $RemoveBef'50mg'LfYZIIiLps$  nightly, HCTZ $RemoveBefor'25mg'mPNbEsighlBx$  daily, Losartan $RemoveBeforeD'50mg'zBdfflKouOkUOQ$  daily.  BP is not at goal today.  Pt has been taking Dayquil and Nyquil due to ongoing illness.  No CP.  + SOB due to URI.  No HAs, visual changes, edema.  DM- chronic problem, on Metformin XR $RemoveBefo'500mg'QCtiSQFHdNw$  BID.  Last A1C 6.5%  Due for foot exam.  Pt had eye exam in August at [facility].  On ARB for renal protection.  Sugar prior to appt- 90.  No symptomatic lows.  No neuropathy of feet but tingling in hands.    Obesity- current weight 319.  BMI 53.08  URI- sxs started Saturday.  No fevers.  Denies sinus pain/pressure.  Cough is severe and productive.  + nasal congestion.  + sick contacts.  Increased SOB, wheezing.  Using inhalers.  ROS:   See pertinent positives and negatives per HPI.  Patient Active Problem List   Diagnosis Date Noted   Essential hypertension 07/17/2013    Priority: Medium    Polyp of transverse colon    Polyp of sigmoid colon    Polyp of rectum    Adjustment disorder with depressed mood 10/24/2020   Hearing loss of right ear 09/04/2020   Hyperlipidemia associated with type 2 diabetes mellitus ([place]) 02/21/2020   Insomnia 02/21/2020   Vitamin D deficiency 02/21/2020   Degenerative disc disease, lumbar 02/21/2020   Diabetic polyneuropathy associated with diabetes mellitus due to underlying condition ([place]) 06/14/2019   Special screening for  malignant neoplasms, colon 04/11/2018   Memory changes 11/23/2017   RLS (restless legs syndrome) 03/17/2017   ADHD (attention deficit hyperactivity disorder), 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   Reactive airway disease 08/21/2016   Gastroesophageal reflux disease without esophagitis 12/20/2015   Breast mass, left 02/16/2014   Depression with anxiety 07/17/2013   Eustachian tube dysfunction 07/17/2013   Adult BMI 50.0-59.9 kg/sq m ([place]) 07/17/2013    Social History   Tobacco Use   Smoking status: Former    Types: Cigarettes   Smokeless tobacco: Never  Substance Use Topics   Alcohol use: Yes    Alcohol/week: 0.0 standard drinks    Comment: 2 drinks monthly    Current Outpatient Medications:    albuterol (VENTOLIN HFA) 108 (90 Base) MCG/ACT inhaler, Inhale 2 puffs into the lungs every 6 (six) hours as needed for wheezing or shortness of breath., Disp: 18 g, Rfl: 0   ALPRAZolam (XANAX) 0.5 MG tablet, Take 1 tablet (0.5 mg total) by mouth 2 (two) times daily as needed for anxiety., Disp: 30 tablet, Rfl: 1   atenolol (TENORMIN) 50 MG tablet, TAKE 2 TABLETS BY MOUTH AT BEDTIME, Disp: 180 tablet, Rfl: 1   B Complex-C (B-COMPLEX WITH VITAMIN C) tablet, Take 1 tablet by mouth daily., Disp: , Rfl:    Blood Glucose Monitoring Suppl (FREESTYLE LITE) w/Device KIT, Use as directed to test blood glucose once to twice daily., Disp: 1 kit, Rfl: 2   cetirizine (ZYRTEC) 10  MG tablet, Take 10 mg by mouth daily., Disp: , Rfl:    cyclobenzaprine (FLEXERIL) 10 MG tablet, TAKE 1 TABLET BY MOUTH EVERY NIGHT AT BEDTIME, Disp: 30 tablet, Rfl: 0   desonide (DESOWEN) 0.05 % cream, Apply topically 2 (two) times daily., Disp: 30 g, Rfl: 3   escitalopram (LEXAPRO) 10 MG tablet, Take 1 tablet (10 mg total) by mouth daily., Disp: 30 tablet, Rfl: 3   fluticasone (FLONASE) 50 MCG/ACT nasal spray, USE 2 SPRAYS IN EACH NOSTRIL ONCE A DAY, Disp: 16 g, Rfl: 6   fluticasone (FLOVENT HFA) 110 MCG/ACT inhaler, Inhale 2 puffs into  the lungs 2 (two) times daily., Disp: 12 g, Rfl: 12   furosemide (LASIX) 20 MG tablet, TAKE 1 TABLET BY MOUTH ONCE A DAY, Disp: 90 tablet, Rfl: 1   gabapentin (NEURONTIN) 300 MG capsule, TAKE 1 CAPSULE BY MOUTH EVERY NIGHT AT BEDTIME (Patient taking differently: Take 300 mg by mouth at bedtime as needed (pain).), Disp: 30 capsule, Rfl: 3   glucose blood (FREESTYLE LITE) test strip, Use as directed to test blood glucose once to twice daily., Disp: 100 each, Rfl: 12   guaiFENesin-codeine (ROBITUSSIN AC) 100-10 MG/5ML syrup, Take 10 mLs by mouth 3 (three) times daily as needed for cough., Disp: 120 mL, Rfl: 0   hydrochlorothiazide (HYDRODIURIL) 25 MG tablet, TAKE 1 TABLET BY MOUTH ONCE A DAY, Disp: 90 tablet, Rfl: 0   hydroxychloroquine (PLAQUENIL) 200 MG tablet, TAKE 1 TABLET BY MOUTH 2 TIMES DAILY, Disp: 180 tablet, Rfl: 0   Lancets (FREESTYLE) lancets, Use to test blood glucose once or twice daily., Disp: 100 each, Rfl: 12   losartan (COZAAR) 50 MG tablet, TAKE 2 TABLETS BY MOUTH ONCE A DAY, Disp: 60 tablet, Rfl: 1   metFORMIN (GLUCOPHAGE-XR) 500 MG 24 hr tablet, TAKE 1 TABLET BY MOUTH TWO TIMES DAILY WITH FOOD, Disp: 180 tablet, Rfl: 1   Omega-3 Fatty Acids (FISH OIL) 1000 MG CAPS, Take 1,000 mg by mouth daily., Disp: , Rfl:    ondansetron (ZOFRAN) 4 MG tablet, TAKE 1 TABLET BY MOUTH EVERY 6 HOURS, Disp: 60 tablet, Rfl: 0   pantoprazole (PROTONIX) 40 MG tablet, TAKE 1 TABLET (40 MG TOTAL) BY MOUTH DAILY., Disp: 90 tablet, Rfl: 11   predniSONE (DELTASONE) 10 MG tablet, Take 3 tablets by mouth daily for 3 days and then 2 tabs daily for 3 days and then 1 tab daily for 3 days. Take with food., Disp: 18 tablet, Rfl: 0   Sulfacetamide Sodium-Sulfur 10-5 % LIQD, Use twice a day as directed., Disp: 170 g, Rfl: 3   traMADol-acetaminophen (ULTRACET) 37.5-325 MG tablet, TAKE 1 TABLET BY MOUTH EVERY 6 HOURS AS NEEDED, Disp: 30 tablet, Rfl: 0   traZODone (DESYREL) 50 MG tablet, TAKE 1/2 TO 1 TABLET BY MOUTH AT  BEDTIME AS NEEDED FOR SLEEP, Disp: 30 tablet, Rfl: 3   triamcinolone cream (KENALOG) 0.1 %, Apply topically 2 (two) times daily., Disp: 60 g, Rfl: 3   ZINC-VITAMIN C PO, Take 1 tablet by mouth daily., Disp: , Rfl:   Allergies  Allergen Reactions   Penicillins Other (See Comments)    Childhood allergy.    Objective:   BP (!) 146/84   Pulse 76   Temp 98.2 F (36.8 C)   Resp 20   Ht $R'5\' 5"'Zw$  (1.651 m)   Wt (!) 319 lb (144.7 kg)   LMP 12/04/2011   SpO2 100%   BMI 53.08 kg/m  AAOx3, NAD NCAT, EOMI No obvious  CN deficits Coloring WNL Pt is able to speak clearly, coherently without shortness of breath or increased work of breathing.  + wet, hacking cough Thought process is linear.  Mood is appropriate.    Assessment and Plan:   HTN- chronic problem.  BP is elevated today but pt is taking cough/cold medication.  Don't want to make a change based on an inaccurate reading as pt has hx of orthostatic hypotension.  Will follow at future visits.  DM- chronic problem.  Last A1C 6.5%  Today's CBG 90.  On ARB for renal protection.  States she is up to date on eye exam- attempting to track this down.  Due for foot exam.  Check labs.  Adjust meds prn   Obesity- ongoing issue for pt.  BMI is 53.08  Needs continued work on low carb diet and regular exercise.  URI- new.  Pt got this from her grandson who had similar sxs at the beach last week.  No fevers.  Otherwise feels well w/ exception of cough, congestion, SOB, and wheezing.  Due to asthma, will start Prednisone.  Continue to use albuterol as needed.  Codeine cough syrup prn.  Pt expressed understanding and is in agreement w/ plan.    Annye Asa, MD 09/04/2021

## 2021-09-13 ENCOUNTER — Other Ambulatory Visit: Payer: Self-pay | Admitting: Family Medicine

## 2021-09-15 ENCOUNTER — Other Ambulatory Visit (HOSPITAL_COMMUNITY): Payer: Self-pay

## 2021-09-15 MED ORDER — FLUTICASONE PROPIONATE 50 MCG/ACT NA SUSP
2.0000 | Freq: Every day | NASAL | 6 refills | Status: DC
  Filled 2021-09-15: qty 16, 30d supply, fill #0
  Filled 2022-06-08: qty 16, 30d supply, fill #1

## 2021-09-16 ENCOUNTER — Other Ambulatory Visit: Payer: Self-pay | Admitting: Family Medicine

## 2021-09-16 ENCOUNTER — Ambulatory Visit: Payer: 59 | Admitting: Physician Assistant

## 2021-09-16 ENCOUNTER — Encounter: Payer: Self-pay | Admitting: Physician Assistant

## 2021-09-16 ENCOUNTER — Ambulatory Visit: Payer: Self-pay

## 2021-09-16 ENCOUNTER — Other Ambulatory Visit (HOSPITAL_COMMUNITY): Payer: Self-pay

## 2021-09-16 ENCOUNTER — Other Ambulatory Visit: Payer: Self-pay

## 2021-09-16 ENCOUNTER — Other Ambulatory Visit: Payer: Self-pay | Admitting: Specialist

## 2021-09-16 VITALS — BP 169/106 | HR 72 | Ht 65.0 in | Wt 307.8 lb

## 2021-09-16 DIAGNOSIS — Z13220 Encounter for screening for lipoid disorders: Secondary | ICD-10-CM | POA: Diagnosis not present

## 2021-09-16 DIAGNOSIS — M5136 Other intervertebral disc degeneration, lumbar region: Secondary | ICD-10-CM

## 2021-09-16 DIAGNOSIS — M47819 Spondylosis without myelopathy or radiculopathy, site unspecified: Secondary | ICD-10-CM | POA: Diagnosis not present

## 2021-09-16 DIAGNOSIS — G2581 Restless legs syndrome: Secondary | ICD-10-CM

## 2021-09-16 DIAGNOSIS — R5383 Other fatigue: Secondary | ICD-10-CM

## 2021-09-16 DIAGNOSIS — E559 Vitamin D deficiency, unspecified: Secondary | ICD-10-CM | POA: Diagnosis not present

## 2021-09-16 DIAGNOSIS — Z79899 Other long term (current) drug therapy: Secondary | ICD-10-CM

## 2021-09-16 DIAGNOSIS — M359 Systemic involvement of connective tissue, unspecified: Secondary | ICD-10-CM | POA: Diagnosis not present

## 2021-09-16 DIAGNOSIS — Z8249 Family history of ischemic heart disease and other diseases of the circulatory system: Secondary | ICD-10-CM

## 2021-09-16 DIAGNOSIS — M79642 Pain in left hand: Secondary | ICD-10-CM | POA: Diagnosis not present

## 2021-09-16 DIAGNOSIS — M51369 Other intervertebral disc degeneration, lumbar region without mention of lumbar back pain or lower extremity pain: Secondary | ICD-10-CM

## 2021-09-16 DIAGNOSIS — E0842 Diabetes mellitus due to underlying condition with diabetic polyneuropathy: Secondary | ICD-10-CM | POA: Diagnosis not present

## 2021-09-16 DIAGNOSIS — K219 Gastro-esophageal reflux disease without esophagitis: Secondary | ICD-10-CM

## 2021-09-16 DIAGNOSIS — Z87891 Personal history of nicotine dependence: Secondary | ICD-10-CM

## 2021-09-16 DIAGNOSIS — R21 Rash and other nonspecific skin eruption: Secondary | ICD-10-CM | POA: Diagnosis not present

## 2021-09-16 DIAGNOSIS — M79641 Pain in right hand: Secondary | ICD-10-CM

## 2021-09-16 DIAGNOSIS — F900 Attention-deficit hyperactivity disorder, predominantly inattentive type: Secondary | ICD-10-CM

## 2021-09-16 DIAGNOSIS — J4541 Moderate persistent asthma with (acute) exacerbation: Secondary | ICD-10-CM

## 2021-09-16 DIAGNOSIS — F418 Other specified anxiety disorders: Secondary | ICD-10-CM

## 2021-09-16 DIAGNOSIS — L659 Nonscarring hair loss, unspecified: Secondary | ICD-10-CM

## 2021-09-16 DIAGNOSIS — G8929 Other chronic pain: Secondary | ICD-10-CM

## 2021-09-16 DIAGNOSIS — M533 Sacrococcygeal disorders, not elsewhere classified: Secondary | ICD-10-CM

## 2021-09-16 DIAGNOSIS — I10 Essential (primary) hypertension: Secondary | ICD-10-CM

## 2021-09-16 MED ORDER — HYDROXYCHLOROQUINE SULFATE 200 MG PO TABS
ORAL_TABLET | Freq: Two times a day (BID) | ORAL | 0 refills | Status: DC
  Filled 2021-09-16: qty 180, fill #0
  Filled 2021-12-12: qty 180, 90d supply, fill #0

## 2021-09-16 MED FILL — Gabapentin Cap 300 MG: ORAL | 30 days supply | Qty: 30 | Fill #2 | Status: AC

## 2021-09-16 NOTE — Patient Instructions (Signed)
Hand Exercises Hand exercises can be helpful for almost anyone. These exercises can strengthen the hands, improve flexibility and movement, and increase blood flow to the hands. These results can make work and daily tasks easier. Hand exercises can be especially helpful for people who have joint pain from arthritis or have nerve damage from overuse (carpal tunnel syndrome). These exercises can also help people who have injured a hand. Exercises Most of these hand exercises are gentle stretching and motion exercises. It is usually safe to do them often throughout the day. Warming up your hands before exercise may help to reduce stiffness. You can do this with gentle massage or by placing your hands in warm water for 10-15 minutes. It is normal to feel some stretching, pulling, tightness, or mild discomfort as you begin new exercises. This will gradually improve. Stop an exercise right away if you feel sudden, severe pain or your pain gets worse. Ask your health care provider which exercises are best for you. Knuckle bend or "claw" fist  Stand or sit with your arm, hand, and all five fingers pointed straight up. Make sure to keep your wrist straight during the exercise. Gently bend your fingers down toward your palm until the tips of your fingers are touching the top of your palm. Keep your big knuckle straight and just bend the small knuckles in your fingers. Hold this position for __________ seconds. Straighten (extend) your fingers back to the starting position. Repeat this exercise 5-10 times with each hand. Full finger fist  Stand or sit with your arm, hand, and all five fingers pointed straight up. Make sure to keep your wrist straight during the exercise. Gently bend your fingers into your palm until the tips of your fingers are touching the middle of your palm. Hold this position for __________ seconds. Extend your fingers back to the starting position, stretching every joint fully. Repeat  this exercise 5-10 times with each hand. Straight fist Stand or sit with your arm, hand, and all five fingers pointed straight up. Make sure to keep your wrist straight during the exercise. Gently bend your fingers at the big knuckle, where your fingers meet your hand, and the middle knuckle. Keep the knuckle at the tips of your fingers straight and try to touch the bottom of your palm. Hold this position for __________ seconds. Extend your fingers back to the starting position, stretching every joint fully. Repeat this exercise 5-10 times with each hand. Tabletop  Stand or sit with your arm, hand, and all five fingers pointed straight up. Make sure to keep your wrist straight during the exercise. Gently bend your fingers at the big knuckle, where your fingers meet your hand, as far down as you can while keeping the small knuckles in your fingers straight. Think of forming a tabletop with your fingers. Hold this position for __________ seconds. Extend your fingers back to the starting position, stretching every joint fully. Repeat this exercise 5-10 times with each hand. Finger spread  Place your hand flat on a table with your palm facing down. Make sure your wrist stays straight as you do this exercise. Spread your fingers and thumb apart from each other as far as you can until you feel a gentle stretch. Hold this position for __________ seconds. Bring your fingers and thumb tight together again. Hold this position for __________ seconds. Repeat this exercise 5-10 times with each hand. Making circles  Stand or sit with your arm, hand, and all five fingers pointed   straight up. Make sure to keep your wrist straight during the exercise. Make a circle by touching the tip of your thumb to the tip of your index finger. Hold for __________ seconds. Then open your hand wide. Repeat this motion with your thumb and each finger on your hand. Repeat this exercise 5-10 times with each hand. Thumb  motion  Sit with your forearm resting on a table and your wrist straight. Your thumb should be facing up toward the ceiling. Keep your fingers relaxed as you move your thumb. Lift your thumb up as high as you can toward the ceiling. Hold for __________ seconds. Bend your thumb across your palm as far as you can, reaching the tip of your thumb for the small finger (pinkie) side of your palm. Hold for __________ seconds. Repeat this exercise 5-10 times with each hand. Grip strengthening  Hold a stress ball or other soft ball in the middle of your hand. Slowly increase the pressure, squeezing the ball as much as you can without causing pain. Think of bringing the tips of your fingers into the middle of your palm. All of your finger joints should bend when doing this exercise. Hold your squeeze for __________ seconds, then relax. Repeat this exercise 5-10 times with each hand. Contact a health care provider if: Your hand pain or discomfort gets much worse when you do an exercise. Your hand pain or discomfort does not improve within 2 hours after you exercise. If you have any of these problems, stop doing these exercises right away. Do not do them again unless your health care provider says that you can. Get help right away if: You develop sudden, severe hand pain or swelling. If this happens, stop doing these exercises right away. Do not do them again unless your health care provider says that you can. This information is not intended to replace advice given to you by your health care provider. Make sure you discuss any questions you have with your health care provider. Document Revised: 02/06/2021 Document Reviewed: 02/06/2021 Elsevier Patient Education  [place].  Hip Bursitis Rehab Ask your health care provider which exercises are safe for you. Do exercises exactly as told by your health care provider and adjust them as directed. It is normal to feel mild stretching, pulling,  tightness, or discomfort as you do these exercises. Stop right away if you feel sudden pain or your pain gets worse. Do not begin these exercises until told by your health care provider. Stretching exercise This exercise warms up your muscles and joints and improves the movement and flexibility of your hip. This exercise also helps to relieve pain and stiffness. Iliotibial band stretch An iliotibial band is a strong band of muscle tissue that runs from the outer side of your hip to the outer side of your thigh and knee. Lie on your side with your left / right leg in the top position. Bend your left / right knee and grab your ankle. Stretch out your bottom arm to help you balance. Slowly bring your knee back so your thigh is behind your body. Slowly lower your knee toward the floor until you feel a gentle stretch on the outside of your left / right thigh. If you do not feel a stretch and your knee will not fall farther, place the heel of your other foot on top of your knee and pull your knee down toward the floor with your foot. Hold this position for __________ seconds. Slowly return  to the starting position. Repeat __________ times. Complete this exercise __________ times a day. Strengthening exercises These exercises build strength and endurance in your hip and pelvis. Endurance is the ability to use your muscles for a long time, even after they get tired. Bridge This exercise strengthens the muscles that move your thigh backward (hip extensors). Lie on your back on a firm surface with your knees bent and your feet flat on the floor. Tighten your buttocks muscles and lift your buttocks off the floor until your trunk is level with your thighs. Do not arch your back. You should feel the muscles working in your buttocks and the back of your thighs. If you do not feel these muscles, slide your feet 1-2 inches (2.5-5 cm) farther away from your buttocks. If this exercise is too easy, try doing it  with your arms crossed over your chest. Hold this position for __________ seconds. Slowly lower your hips to the starting position. Let your muscles relax completely after each repetition. Repeat __________ times. Complete this exercise __________ times a day. Squats This exercise strengthens the muscles in front of your thigh and knee (quadriceps). Stand in front of a table, with your feet and knees pointing straight ahead. You may rest your hands on the table for balance but not for support. Slowly bend your knees and lower your hips like you are going to sit in a chair. Keep your weight over your heels, not over your toes. Keep your lower legs upright so they are parallel with the table legs. Do not let your hips go lower than your knees. Do not bend lower than told by your health care provider. If your hip pain increases, do not bend as low. Hold the squat position for __________ seconds. Slowly push with your legs to return to standing. Do not use your hands to pull yourself to standing. Repeat __________ times. Complete this exercise __________ times a day. Hip hike Stand sideways on a bottom step. Stand on your left / right leg with your other foot unsupported next to the step. You can hold on to the railing or wall for balance if needed. Keep your knees straight and your torso square. Then lift your left / right hip up toward the ceiling. Hold this position for __________ seconds. Slowly let your left / right hip lower toward the floor, past the starting position. Your foot should get closer to the floor. Do not lean or bend your knees. Repeat __________ times. Complete this exercise __________ times a day. Single leg stand Without shoes, stand near a railing or in a doorway. You may hold on to the railing or door frame as needed for balance. Squeeze your left / right buttock muscles, then lift up your other foot. Do not let your left / right hip push out to the side. It is helpful  to stand in front of a mirror for this exercise so you can watch your hip. Hold this position for __________ seconds. Repeat __________ times. Complete this exercise __________ times a day. This information is not intended to replace advice given to you by your health care provider. Make sure you discuss any questions you have with your health care provider. Document Revised: 02/13/2019 Document Reviewed: 02/13/2019 Elsevier Patient Education  [place].

## 2021-09-16 NOTE — Progress Notes (Signed)
Please call the patient to discuss x-ray results: unremarkable x-rays of both hands.  Please advise the patient to notify us if her discomfort persists or worsens and we can schedule an ultrasound of both hands for further evaluation.

## 2021-09-17 ENCOUNTER — Encounter: Payer: Self-pay | Admitting: Family Medicine

## 2021-09-17 ENCOUNTER — Other Ambulatory Visit (HOSPITAL_COMMUNITY): Payer: Self-pay

## 2021-09-17 MED ORDER — LOSARTAN POTASSIUM 50 MG PO TABS
ORAL_TABLET | Freq: Every day | ORAL | 1 refills | Status: DC
  Filled 2021-09-17: qty 60, fill #0
  Filled 2021-09-28: qty 60, 30d supply, fill #0
  Filled 2021-10-27: qty 60, 30d supply, fill #1

## 2021-09-17 MED ORDER — FUROSEMIDE 20 MG PO TABS
ORAL_TABLET | Freq: Every day | ORAL | 1 refills | Status: DC
  Filled 2021-09-17 – 2021-10-28 (×4): qty 90, 90d supply, fill #0
  Filled 2021-12-12 – 2022-02-26 (×2): qty 90, 90d supply, fill #1

## 2021-09-17 MED ORDER — HYDROCHLOROTHIAZIDE 25 MG PO TABS
ORAL_TABLET | Freq: Every day | ORAL | 0 refills | Status: DC
  Filled 2021-09-17: qty 90, fill #0
  Filled 2021-12-12: qty 90, 90d supply, fill #0

## 2021-09-17 MED ORDER — CYCLOBENZAPRINE HCL 10 MG PO TABS
ORAL_TABLET | Freq: Every day | ORAL | 0 refills | Status: DC
  Filled 2021-09-17: qty 30, 30d supply, fill #0

## 2021-09-19 ENCOUNTER — Other Ambulatory Visit (HOSPITAL_COMMUNITY): Payer: Self-pay

## 2021-09-22 NOTE — Progress Notes (Signed)
ANA remains positive, low titer.  CBC WNL.  Glucose is 134.  Rest of CMP WNL.   ESR remains elevated but is slightly lower.  C3 and C4 are not low. dsDNA is negative.  Protein creatinine ratio WNL.  Beta-2 glycoproteins and anticardiolipin antibodies negative.  LA negative.   Labs are not consistent with a flare.     Hemoglobin A1c is 6.4.  lipid panel WNL.  Please forward these results to the patients PCP as requested.

## 2021-09-28 ENCOUNTER — Other Ambulatory Visit: Payer: Self-pay | Admitting: Physician Assistant

## 2021-09-28 LAB — CBC WITH DIFFERENTIAL/PLATELET
Absolute Monocytes: 547 cells/uL (ref 200–950)
Basophils Absolute: 43 cells/uL (ref 0–200)
Basophils Relative: 0.6 %
Eosinophils Absolute: 405 cells/uL (ref 15–500)
Eosinophils Relative: 5.7 %
HCT: 37.9 % (ref 35.0–45.0)
Hemoglobin: 12.3 g/dL (ref 11.7–15.5)
Lymphs Abs: 2741 cells/uL (ref 850–3900)
MCH: 28.3 pg (ref 27.0–33.0)
MCHC: 32.5 g/dL (ref 32.0–36.0)
MCV: 87.1 fL (ref 80.0–100.0)
MPV: 9.4 fL (ref 7.5–12.5)
Monocytes Relative: 7.7 %
Neutro Abs: 3365 cells/uL (ref 1500–7800)
Neutrophils Relative %: 47.4 %
Platelets: 367 10*3/uL (ref 140–400)
RBC: 4.35 10*6/uL (ref 3.80–5.10)
RDW: 15.8 % — ABNORMAL HIGH (ref 11.0–15.0)
Total Lymphocyte: 38.6 %
WBC: 7.1 10*3/uL (ref 3.8–10.8)

## 2021-09-28 LAB — LIPID PANEL
Cholesterol: 135 mg/dL (ref <200)
HDL: 56 mg/dL (ref >=50)
LDL Cholesterol (Calc): 62 mg/dL (calc)
Non-HDL Cholesterol (Calc): 79 mg/dL (calc) (ref <130)
Total CHOL/HDL Ratio: 2.4 (calc) (ref <5.0)
Triglycerides: 87 mg/dL (ref <150)

## 2021-09-28 LAB — COMPLETE METABOLIC PANEL WITH GFR
AG Ratio: 1.1 (calc) (ref 1.0–2.5)
ALT: 8 U/L (ref 6–29)
AST: 10 U/L (ref 10–35)
Albumin: 3.7 g/dL (ref 3.6–5.1)
Alkaline phosphatase (APISO): 83 U/L (ref 31–125)
BUN: 10 mg/dL (ref 7–25)
CO2: 31 mmol/L (ref 20–32)
Calcium: 9.1 mg/dL (ref 8.6–10.2)
Chloride: 102 mmol/L (ref 98–110)
Creat: 0.89 mg/dL (ref 0.50–0.99)
Globulin: 3.5 g/dL (calc) (ref 1.9–3.7)
Glucose, Bld: 134 mg/dL — ABNORMAL HIGH (ref 65–99)
Potassium: 4.2 mmol/L (ref 3.5–5.3)
Sodium: 140 mmol/L (ref 135–146)
Total Bilirubin: 0.3 mg/dL (ref 0.2–1.2)
Total Protein: 7.2 g/dL (ref 6.1–8.1)
eGFR: 80 mL/min/{1.73_m2} (ref >=60)

## 2021-09-28 LAB — ANA: Anti Nuclear Antibody (ANA): POSITIVE — AB

## 2021-09-28 LAB — CARDIOLIPIN ANTIBODIES, IGG, IGM, IGA
Anticardiolipin IgA: <2 APL-U/mL
Anticardiolipin IgG: <2 GPL-U/mL
Anticardiolipin IgM: <2 MPL-U/mL

## 2021-09-28 LAB — PROTEIN / CREATININE RATIO, URINE
Creatinine, Urine: 221 mg/dL (ref 20–275)
Protein/Creat Ratio: 59 mg/g creat (ref 24–184)
Protein/Creatinine Ratio: 0.059 mg/mg creat (ref 0.024–0.184)
Total Protein, Urine: 13 mg/dL (ref 5–24)

## 2021-09-28 LAB — LUPUS ANTICOAGULANT EVAL W/ REFLEX
PTT-LA Screen: 33 s (ref <=40)
dRVVT: 34 s (ref <=45)

## 2021-09-28 LAB — ANTI-NUCLEAR AB-TITER (ANA TITER)
ANA TITER: 1:160 {titer} — ABNORMAL HIGH
ANA Titer 1: 1:160 {titer} — ABNORMAL HIGH

## 2021-09-28 LAB — ANTI-DNA ANTIBODY, DOUBLE-STRANDED: ds DNA Ab: <1 IU/mL

## 2021-09-28 LAB — HEMOGLOBIN A1C
Hgb A1c MFr Bld: 6.4 % of total Hgb — ABNORMAL HIGH (ref <5.7)
Mean Plasma Glucose: 137 mg/dL
eAG (mmol/L): 7.6 mmol/L

## 2021-09-28 LAB — BETA-2 GLYCOPROTEIN ANTIBODIES
Beta-2 Glyco 1 IgA: <2 U/mL
Beta-2 Glyco 1 IgM: <2 U/mL
Beta-2 Glyco I IgG: <2 U/mL

## 2021-09-28 LAB — C3 AND C4
C3 Complement: 176 mg/dL (ref 83–193)
C4 Complement: 58 mg/dL — ABNORMAL HIGH (ref 15–57)

## 2021-09-28 LAB — SEDIMENTATION RATE: Sed Rate: 45 mm/h — ABNORMAL HIGH (ref 0–20)

## 2021-09-28 LAB — HYDROXYCHLOROQUINE,BLOOD: HYDROXYCHLOROQUINE, (B): 300 ng/mL

## 2021-09-28 NOTE — Progress Notes (Signed)
PLQ level is within the subtherapeutic range.  Please clarify how she is taking PLQ.  Lupus anticoagulant is negative.

## 2021-09-29 ENCOUNTER — Other Ambulatory Visit (HOSPITAL_COMMUNITY): Payer: Self-pay

## 2021-09-29 MED ORDER — ONDANSETRON HCL 4 MG PO TABS
ORAL_TABLET | Freq: Four times a day (QID) | ORAL | 0 refills | Status: DC
  Filled 2021-09-29: qty 60, 15d supply, fill #0

## 2021-09-29 NOTE — Telephone Encounter (Signed)
Next Visit: 02/10/2022  Last Visit: 09/16/2021  Last Fill: 08/26/2021  Dx: Autoimmune disease   Current Dose per office note on 09/16/2021: she takes Zofran intermittently.   Okay to refill Zofran?

## 2021-09-30 ENCOUNTER — Other Ambulatory Visit (HOSPITAL_COMMUNITY): Payer: Self-pay

## 2021-09-30 NOTE — Progress Notes (Signed)
Please stressed the importance of remaining compliant taking Plaquenil as prescribed.  We will recheck hydroxychloroquine blood level in 3 months.

## 2021-10-02 ENCOUNTER — Telehealth: Payer: Self-pay | Admitting: *Deleted

## 2021-10-02 ENCOUNTER — Encounter: Payer: Self-pay | Admitting: Family Medicine

## 2021-10-02 DIAGNOSIS — M359 Systemic involvement of connective tissue, unspecified: Secondary | ICD-10-CM

## 2021-10-02 DIAGNOSIS — Z79899 Other long term (current) drug therapy: Secondary | ICD-10-CM

## 2021-10-02 NOTE — Telephone Encounter (Signed)
-----   Message from Ofilia Neas, PA-C sent at 09/30/2021  4:01 PM EST ----- Please stressed the importance of remaining compliant taking Plaquenil as prescribed.  We will recheck hydroxychloroquine blood level in 3 months.

## 2021-10-07 ENCOUNTER — Other Ambulatory Visit (HOSPITAL_COMMUNITY): Payer: Self-pay

## 2021-10-07 ENCOUNTER — Ambulatory Visit: Payer: 59 | Admitting: Family Medicine

## 2021-10-07 ENCOUNTER — Other Ambulatory Visit: Payer: Self-pay | Admitting: Family Medicine

## 2021-10-07 ENCOUNTER — Encounter: Payer: Self-pay | Admitting: Family Medicine

## 2021-10-07 VITALS — BP 130/86 | HR 81 | Temp 98.9°F | Resp 17 | Wt 325.8 lb

## 2021-10-07 DIAGNOSIS — R052 Subacute cough: Secondary | ICD-10-CM

## 2021-10-07 DIAGNOSIS — R062 Wheezing: Secondary | ICD-10-CM | POA: Diagnosis not present

## 2021-10-07 MED ORDER — FLUTICASONE-SALMETEROL 115-21 MCG/ACT IN AERO
2.0000 | INHALATION_SPRAY | Freq: Two times a day (BID) | RESPIRATORY_TRACT | 12 refills | Status: DC
  Filled 2021-10-07: qty 12, 30d supply, fill #0

## 2021-10-07 MED ORDER — BUDESONIDE-FORMOTEROL FUMARATE 160-4.5 MCG/ACT IN AERO
2.0000 | INHALATION_SPRAY | Freq: Two times a day (BID) | RESPIRATORY_TRACT | 3 refills | Status: DC
  Filled 2021-10-07: qty 10.2, 30d supply, fill #0

## 2021-10-07 MED ORDER — ALBUTEROL SULFATE HFA 108 (90 BASE) MCG/ACT IN AERS
2.0000 | INHALATION_SPRAY | Freq: Four times a day (QID) | RESPIRATORY_TRACT | 3 refills | Status: DC | PRN
  Filled 2021-10-07: qty 18, 25d supply, fill #0

## 2021-10-07 MED ORDER — PREDNISONE 10 MG PO TABS
ORAL_TABLET | ORAL | 0 refills | Status: DC
  Filled 2021-10-07: qty 18, 9d supply, fill #0

## 2021-10-07 NOTE — Patient Instructions (Signed)
Please go to [facility] to get your chest xray STOP the Flovent START the Symbicort- 2 puffs twice daily USE the albuterol as needed TAKE the Prednisone as directed.  Take w/ food If not improving after this round of Prednisone, let me know and we'll refer to pulmonary Call with any questions or concerns Hang in there!!!

## 2021-10-07 NOTE — Progress Notes (Signed)
   Subjective:    Patient ID: Vanessa Reeves, female    DOB: 1971/03/22, 50 y.o.   MRN: [id number]  HPI Wheezing- pt has been having sxs since COVID in September.  Pt has albuterol inhaler (using albuterol ~3x/day) and Flovent daily (2 puffs BID).  Also has persistent cough.  Pt reports feeling SOB w/ minimal exertion.  Pt did a Prednisone taper back on 11/3 w/ some improvement.  Pt reports feeling fine w/ exception of cough and SOB.     Review of Systems For ROS see HPI   This visit occurred during the SARS-CoV-2 public health emergency.  Safety protocols were in place, including screening questions prior to the visit, additional usage of staff PPE, and extensive cleaning of exam room while observing appropriate contact time as indicated for disinfecting solutions.      Objective:   Physical Exam Vitals reviewed.  Constitutional:      General: She is not in acute distress.    Appearance: Normal appearance. She is obese. She is not ill-appearing or diaphoretic.  HENT:     Head: Normocephalic and atraumatic.     Nose: Nose normal. No congestion.     Mouth/Throat:     Mouth: Mucous membranes are moist.  Eyes:     Extraocular Movements: Extraocular movements intact.     Conjunctiva/sclera: Conjunctivae normal.     Pupils: Pupils are equal, round, and reactive to light.  Cardiovascular:     Rate and Rhythm: Normal rate and regular rhythm.     Heart sounds: Normal heart sounds.  Pulmonary:     Effort: Pulmonary effort is normal. No respiratory distress.     Breath sounds: No stridor. Wheezing (diffuse expiratory wheezes) present. No rhonchi.     Comments: Barking, harsh cough Musculoskeletal:     Cervical back: Neck supple.  Lymphadenopathy:     Cervical: No cervical adenopathy.  Neurological:     Mental Status: She is alert.          Assessment & Plan:   Cough/Wheeze- deteriorated.  Pt reports she has a near constant wheeze, SOB w/ minimal exertion, and a barking, harsh  cough since she had COVID in September.  It is now painful to cough b/c she has coughed so long and so hard.  She had some relief w/ a previous Prednisone taper.  Will switch Flovent to Symbicort, continue albuterol, repeat Prednisone taper, get a CXR, and if no improvement will refer to Pulmonary.  Pt expressed understanding and is in agreement w/ plan. s

## 2021-10-08 ENCOUNTER — Other Ambulatory Visit (HOSPITAL_COMMUNITY): Payer: Self-pay

## 2021-10-15 ENCOUNTER — Telehealth (HOSPITAL_BASED_OUTPATIENT_CLINIC_OR_DEPARTMENT_OTHER): Payer: Self-pay

## 2021-10-17 ENCOUNTER — Other Ambulatory Visit (HOSPITAL_COMMUNITY): Payer: Self-pay

## 2021-10-20 ENCOUNTER — Encounter (HOSPITAL_BASED_OUTPATIENT_CLINIC_OR_DEPARTMENT_OTHER): Payer: 59

## 2021-10-20 DIAGNOSIS — Z1231 Encounter for screening mammogram for malignant neoplasm of breast: Secondary | ICD-10-CM

## 2021-10-22 ENCOUNTER — Other Ambulatory Visit (HOSPITAL_COMMUNITY): Payer: Self-pay

## 2021-10-22 ENCOUNTER — Ambulatory Visit: Payer: 59 | Admitting: Family Medicine

## 2021-10-22 ENCOUNTER — Telehealth (HOSPITAL_BASED_OUTPATIENT_CLINIC_OR_DEPARTMENT_OTHER): Payer: Self-pay

## 2021-10-23 ENCOUNTER — Other Ambulatory Visit (HOSPITAL_COMMUNITY): Payer: Self-pay

## 2021-10-28 ENCOUNTER — Other Ambulatory Visit (HOSPITAL_COMMUNITY): Payer: Self-pay

## 2021-11-06 ENCOUNTER — Other Ambulatory Visit (HOSPITAL_COMMUNITY): Payer: Self-pay

## 2021-11-15 ENCOUNTER — Telehealth (HOSPITAL_BASED_OUTPATIENT_CLINIC_OR_DEPARTMENT_OTHER): Payer: Self-pay

## 2021-11-28 ENCOUNTER — Other Ambulatory Visit (HOSPITAL_COMMUNITY): Payer: Self-pay

## 2021-11-28 ENCOUNTER — Encounter: Payer: Self-pay | Admitting: Family Medicine

## 2021-11-28 ENCOUNTER — Ambulatory Visit (INDEPENDENT_AMBULATORY_CARE_PROVIDER_SITE_OTHER): Payer: 59 | Admitting: Family Medicine

## 2021-11-28 VITALS — BP 136/80 | HR 86 | Temp 97.9°F | Resp 16 | Ht 65.0 in | Wt 339.0 lb

## 2021-11-28 DIAGNOSIS — E0842 Diabetes mellitus due to underlying condition with diabetic polyneuropathy: Secondary | ICD-10-CM | POA: Diagnosis not present

## 2021-11-28 DIAGNOSIS — E559 Vitamin D deficiency, unspecified: Secondary | ICD-10-CM | POA: Diagnosis not present

## 2021-11-28 DIAGNOSIS — Z0000 Encounter for general adult medical examination without abnormal findings: Secondary | ICD-10-CM | POA: Diagnosis not present

## 2021-11-28 DIAGNOSIS — Z6841 Body Mass Index (BMI) 40.0 and over, adult: Secondary | ICD-10-CM

## 2021-11-28 DIAGNOSIS — R079 Chest pain, unspecified: Secondary | ICD-10-CM | POA: Diagnosis not present

## 2021-11-28 DIAGNOSIS — F418 Other specified anxiety disorders: Secondary | ICD-10-CM

## 2021-11-28 LAB — CBC WITH DIFFERENTIAL/PLATELET
Basophils Absolute: 0 10*3/uL (ref 0.0–0.1)
Basophils Relative: 0.3 % (ref 0.0–3.0)
Eosinophils Absolute: 0.2 10*3/uL (ref 0.0–0.7)
Eosinophils Relative: 3.4 % (ref 0.0–5.0)
HCT: 37.3 % (ref 36.0–46.0)
Hemoglobin: 12.2 g/dL (ref 12.0–15.0)
Lymphocytes Relative: 41.3 % (ref 12.0–46.0)
Lymphs Abs: 2.9 10*3/uL (ref 0.7–4.0)
MCHC: 32.7 g/dL (ref 30.0–36.0)
MCV: 86.4 fl (ref 78.0–100.0)
Monocytes Absolute: 0.5 10*3/uL (ref 0.1–1.0)
Monocytes Relative: 6.6 % (ref 3.0–12.0)
Neutro Abs: 3.4 10*3/uL (ref 1.4–7.7)
Neutrophils Relative %: 48.4 % (ref 43.0–77.0)
Platelets: 308 10*3/uL (ref 150.0–400.0)
RBC: 4.32 Mil/uL (ref 3.87–5.11)
RDW: 15.2 % (ref 11.5–15.5)
WBC: 7 10*3/uL (ref 4.0–10.5)

## 2021-11-28 LAB — BASIC METABOLIC PANEL
BUN: 10 mg/dL (ref 6–23)
CO2: 30 mEq/L (ref 19–32)
Calcium: 9.4 mg/dL (ref 8.4–10.5)
Chloride: 101 mEq/L (ref 96–112)
Creatinine, Ser: 0.94 mg/dL (ref 0.40–1.20)
GFR: 71.45 mL/min (ref >60.00)
Glucose, Bld: 96 mg/dL (ref 70–99)
Potassium: 3.3 mEq/L — ABNORMAL LOW (ref 3.5–5.1)
Sodium: 140 mEq/L (ref 135–145)

## 2021-11-28 LAB — HEPATIC FUNCTION PANEL
ALT: 13 U/L (ref 0–35)
AST: 15 U/L (ref 0–37)
Albumin: 4 g/dL (ref 3.5–5.2)
Alkaline Phosphatase: 87 U/L (ref 39–117)
Bilirubin, Direct: 0.1 mg/dL (ref 0.0–0.3)
Total Bilirubin: 0.3 mg/dL (ref 0.2–1.2)
Total Protein: 7.5 g/dL (ref 6.0–8.3)

## 2021-11-28 LAB — LIPID PANEL
Cholesterol: 155 mg/dL (ref 0–200)
HDL: 56.3 mg/dL (ref >39.00)
LDL Cholesterol: 81 mg/dL (ref 0–99)
NonHDL: 98.52
Total CHOL/HDL Ratio: 3
Triglycerides: 86 mg/dL (ref 0.0–149.0)
VLDL: 17.2 mg/dL (ref 0.0–40.0)

## 2021-11-28 LAB — VITAMIN D 25 HYDROXY (VIT D DEFICIENCY, FRACTURES): VITD: 22.98 ng/mL — ABNORMAL LOW (ref 30.00–100.00)

## 2021-11-28 LAB — TSH: TSH: 3.09 u[IU]/mL (ref 0.35–5.50)

## 2021-11-28 MED ORDER — ESCITALOPRAM OXALATE 20 MG PO TABS
20.0000 mg | ORAL_TABLET | Freq: Every day | ORAL | 1 refills | Status: DC
  Filled 2021-11-28: qty 90, 90d supply, fill #0
  Filled 2022-06-08: qty 90, 90d supply, fill #1

## 2021-11-28 NOTE — Assessment & Plan Note (Signed)
Check labs and replete prn. 

## 2021-11-28 NOTE — Assessment & Plan Note (Signed)
Deteriorated.  Pt has gained 13 lbs since early December.  She admits to increased eating since her mom passed and using this to process her grief.  Not exercising.  Increased depression.  Will treat underlying depression w/ the hope that she will find the energy and motivation to care for herself.

## 2021-11-28 NOTE — Assessment & Plan Note (Signed)
Pt's PE WNL w/ exception of obesity.  UTD on colonoscopy, immunizations.  Due for mammo.  Pt to schedule.  Check labs.  Anticipatory guidance provided.

## 2021-11-28 NOTE — Progress Notes (Signed)
Subjective:    Patient ID: Vanessa Reeves, female    DOB: Jan 19, 1971, 51 y.o.   MRN: [id number]  HPI CPE- UTD on Tdap, colonoscopy, flu, eye exam.  Due for mammo.  Patient Care Team    Relationship Specialty Notifications Start End  Midge Minium, MD PCP - General Family Medicine  07/17/13    Comment: Awanda Mink, [place] [facility] Optometry  11/26/15   Dene Gentry, MD Consulting Physician Sports Medicine  11/26/15   Alda Berthold, DO Consulting Physician Neurology  11/26/15     Health Maintenance  Topic Date Due   MAMMOGRAM  08/12/2019   COVID-19 Vaccine (3 - Moderna risk series) 08/01/2020   OPHTHALMOLOGY EXAM  08/26/2021   HEMOGLOBIN A1C  03/16/2022   FOOT EXAM  11/28/2022   TETANUS/TDAP  10/13/2025   COLONOSCOPY (Pts 45-41yrs Insurance coverage will need to be confirmed)  07/10/2031   INFLUENZA VACCINE  Completed   Hepatitis C Screening  Completed   HIV Screening  Completed   HPV VACCINES  Aged Out      Review of Systems Patient reports no vision/ hearing changes, adenopathy,fever, persistant/recurrent hoarseness , swallowing issues,palpitations, edema, persistant/recurrent cough, hemoptysis, dyspnea (rest/exertional/paroxysmal nocturnal), gastrointestinal bleeding (melena, rectal bleeding), abdominal pain, significant heartburn, bowel changes, GU symptoms (dysuria, hematuria, incontinence), Gyn symptoms (abnormal  bleeding, pain),  syncope, focal weakness, memory loss, numbness & tingling, skin/hair/nail changes, abnormal bruising or bleeding.   + 13 lb weight gain- has a gym membership but doesn't go.  Works night shift.   + anxiety/depression- decreased motivation, energy.  Has noticed sxs worsened after mom passed. + chest pain- 'like someone sitting on my chest'.  Sxs started 'a couple of weeks ago'.  Occurs mostly at rest  This visit occurred during the SARS-CoV-2 public health emergency.  Safety protocols were in place, including  screening questions prior to the visit, additional usage of staff PPE, and extensive cleaning of exam room while observing appropriate contact time as indicated for disinfecting solutions.      Objective:   Physical Exam General Appearance:    Alert, cooperative, no distress, appears stated age, obese  Head:    Normocephalic, without obvious abnormality, atraumatic  Eyes:    PERRL, conjunctiva/corneas clear, EOM's intact, fundi    benign, both eyes  Ears:    Normal TM's and external ear canals, both ears  Nose:   Deferred due to COVID  Throat:   Neck:   Supple, symmetrical, trachea midline, no adenopathy;    Thyroid: no enlargement/tenderness/nodules  Back:     Symmetric, no curvature, ROM normal, no CVA tenderness  Lungs:     Clear to auscultation bilaterally, respirations unlabored  Chest Wall:    No tenderness or deformity   Heart:    Regular rate and rhythm, S1 and S2 normal, no murmur, rub   or gallop  Breast Exam:    Deferred to GYN  Abdomen:     Soft, non-tender, bowel sounds active all four quadrants,    no masses, no organomegaly  Genitalia:    Deferred to GYN  Rectal:    Extremities:   Extremities normal, atraumatic, no cyanosis or edema  Pulses:   2+ and symmetric all extremities  Skin:   Skin color, texture, turgor normal, no rashes or lesions  Lymph nodes:   Cervical, supraclavicular, and axillary nodes normal  Neurologic:   CNII-XII intact, normal strength, sensation and reflexes    throughout  Assessment & Plan:  CP- new.  Pt reports a pressure on her chest- 'like someone sitting on it'.  Occurs at rest and not w/ activity- making cardiac CP less likely, but given her risk factors- DM, obesity, HTN, hyperlipidemia- it is worth an evaluation.  Referral to cards placed.  Pt expressed understanding and is in agreement w/ plan.

## 2021-11-28 NOTE — Assessment & Plan Note (Signed)
Chronic problem.  UTD on eye exam, on ARB for renal protection.  Foot exam done today.  Check labs.  Adjust meds prn

## 2021-11-28 NOTE — Assessment & Plan Note (Signed)
Deteriorated since death of mother.  She reports decreased energy, low motivation.  Not doing things she needs to do to take care of her home and herself.  Will increase Lexapro to 20mg  daily and monitor for improvement.  Pt expressed understanding and is in agreement w/ plan.

## 2021-11-28 NOTE — Patient Instructions (Signed)
Follow up in 3-4 months to recheck diabetes We'll notify you of your lab results and make any changes if needed Call [phone] to schedule your mammogram at [facility] the Lexapro to 20mg  daily- 2 of what you have at home, 1 of the new prescription We'll call you with your Cardiology referral Continue to work on healthy diet and regular exercise- you can do it! Call with any questions or concerns Stay Safe!  Stay Healthy! Have a great weekend!

## 2021-12-01 ENCOUNTER — Other Ambulatory Visit (HOSPITAL_COMMUNITY): Payer: Self-pay

## 2021-12-01 ENCOUNTER — Encounter: Payer: Self-pay | Admitting: Family Medicine

## 2021-12-01 LAB — HEMOGLOBIN A1C: Hgb A1c MFr Bld: 6.9 % — ABNORMAL HIGH (ref 4.6–6.5)

## 2021-12-02 ENCOUNTER — Other Ambulatory Visit (HOSPITAL_COMMUNITY): Payer: Self-pay

## 2021-12-02 MED ORDER — VITAMIN D (ERGOCALCIFEROL) 1.25 MG (50000 UNIT) PO CAPS
50000.0000 [IU] | ORAL_CAPSULE | ORAL | 0 refills | Status: DC
  Filled 2021-12-02: qty 12, 84d supply, fill #0

## 2021-12-03 ENCOUNTER — Other Ambulatory Visit (HOSPITAL_COMMUNITY): Payer: Self-pay

## 2021-12-03 ENCOUNTER — Telehealth: Payer: Self-pay

## 2021-12-03 NOTE — Telephone Encounter (Signed)
LVM for patient asking she call back if she had any questions about there labs, I see she viewed them on my chart. Sent in vit d, made patient aware

## 2021-12-03 NOTE — Telephone Encounter (Signed)
-----   Message from Midge Minium, MD sent at 12/01/2021 12:41 PM EST ----- A1C has increased from 6.4 --> 6.9%.  This will improve w/ healthy diet, regular physical activity.  Also, your potassium is slightly low.  Please increase your potassium intake in the form of leafy greens, citrus fruits, bananas, etc.  Vit D is low.  Based on this, we need to start prescription 50,000 units weekly x12 weeks in addition to daily OTC supplement of at least 2000 units.

## 2021-12-12 ENCOUNTER — Other Ambulatory Visit: Payer: Self-pay | Admitting: Family Medicine

## 2021-12-12 ENCOUNTER — Other Ambulatory Visit (HOSPITAL_COMMUNITY): Payer: Self-pay

## 2021-12-12 ENCOUNTER — Other Ambulatory Visit: Payer: Self-pay | Admitting: Specialist

## 2021-12-12 DIAGNOSIS — M5136 Other intervertebral disc degeneration, lumbar region: Secondary | ICD-10-CM

## 2021-12-12 MED ORDER — LOSARTAN POTASSIUM 50 MG PO TABS
ORAL_TABLET | Freq: Every day | ORAL | 1 refills | Status: DC
  Filled 2021-12-12: qty 60, 30d supply, fill #0
  Filled 2022-01-19: qty 60, 30d supply, fill #1

## 2021-12-12 MED ORDER — CYCLOBENZAPRINE HCL 10 MG PO TABS
ORAL_TABLET | Freq: Every day | ORAL | 0 refills | Status: DC
  Filled 2021-12-12: qty 30, 30d supply, fill #0

## 2021-12-13 ENCOUNTER — Other Ambulatory Visit (HOSPITAL_COMMUNITY): Payer: Self-pay

## 2021-12-15 ENCOUNTER — Other Ambulatory Visit (HOSPITAL_COMMUNITY): Payer: Self-pay

## 2021-12-18 ENCOUNTER — Other Ambulatory Visit (HOSPITAL_COMMUNITY): Payer: Self-pay

## 2021-12-21 ENCOUNTER — Other Ambulatory Visit: Payer: Self-pay | Admitting: Specialist

## 2021-12-22 ENCOUNTER — Other Ambulatory Visit (HOSPITAL_COMMUNITY): Payer: Self-pay

## 2021-12-22 MED ORDER — TRAMADOL-ACETAMINOPHEN 37.5-325 MG PO TABS
1.0000 | ORAL_TABLET | Freq: Four times a day (QID) | ORAL | 0 refills | Status: DC | PRN
  Filled 2021-12-22: qty 30, 8d supply, fill #0

## 2022-01-06 ENCOUNTER — Other Ambulatory Visit: Payer: Self-pay | Admitting: Family Medicine

## 2022-01-06 ENCOUNTER — Other Ambulatory Visit (HOSPITAL_COMMUNITY): Payer: Self-pay

## 2022-01-06 MED ORDER — ALPRAZOLAM 0.5 MG PO TABS
0.5000 mg | ORAL_TABLET | Freq: Two times a day (BID) | ORAL | 1 refills | Status: DC | PRN
  Filled 2022-01-06: qty 30, 15d supply, fill #0
  Filled 2022-06-08: qty 30, 15d supply, fill #1

## 2022-01-06 MED FILL — Pantoprazole Sodium EC Tab 40 MG (Base Equiv): ORAL | 90 days supply | Qty: 90 | Fill #1 | Status: AC

## 2022-01-19 ENCOUNTER — Other Ambulatory Visit (HOSPITAL_COMMUNITY): Payer: Self-pay

## 2022-01-27 NOTE — Progress Notes (Signed)
? ?Office Visit Note ? ?Patient: Vanessa Reeves             ?Date of Birth: [birth year]           ?MRN: [id number]             ?PCP: Midge Minium, MD ?Referring: Midge Minium, MD ?Visit Date: 02/10/2022 ?Occupation: _0 @ ? ?Subjective:  ?Medication management ? ?History of Present Illness: Vanessa Reeves is a 51 y.o. female history of autoimmune disease.  She states she has been taking hydroxychloroquine on a regular basis.  She continues to experience some fatigue, joint pain, Raynaud's phenomenon and sicca symptoms.  She has been using over-the-counter products for dry mouth.  She has noticed intermittent puffiness in her hands when she has difficulty putting on her rings.  She feels a stiffness in her hands especially in the morning.  She continues to have lower back pain.  She is followed by Dr. Louanne Skye. ? ?Activities of Daily Living:  ?Patient reports morning stiffness for 5 minutes.   ?Patient Reports nocturnal pain.  ?Difficulty dressing/grooming: Denies ?Difficulty climbing stairs: Denies ?Difficulty getting out of chair: Denies ?Difficulty using hands for taps, buttons, cutlery, and/or writing: Reports ? ?Review of Systems  ?Constitutional:  Positive for fatigue.  ?HENT:  Positive for mouth dryness. Negative for mouth sores and nose dryness.   ?Eyes:  Positive for dryness. Negative for pain and itching.  ?Respiratory:  Negative for shortness of breath and difficulty breathing.   ?Cardiovascular:  Negative for chest pain and palpitations.  ?Gastrointestinal:  Negative for blood in stool, constipation and diarrhea.  ?Endocrine: Negative for increased urination.  ?Genitourinary:  Negative for difficulty urinating.  ?Musculoskeletal:  Positive for joint pain, joint pain, joint swelling, myalgias, morning stiffness, muscle tenderness and myalgias.  ?Skin:  Positive for color change. Negative for rash, redness and sensitivity to sunlight.  ?Allergic/Immunologic: Positive for susceptible to  infections.  ?Neurological:  Positive for numbness and headaches. Negative for dizziness, memory loss and weakness.  ?Hematological:  Negative for bruising/bleeding tendency and swollen glands.  ?Psychiatric/Behavioral:  Negative for depressed mood, confusion and sleep disturbance. The patient is nervous/anxious.   ? ?PMFS History:  ?Patient Active Problem List  ? Diagnosis Date Noted  ? Polyp of transverse colon   ? Polyp of sigmoid colon   ? Polyp of rectum   ? Adjustment disorder with depressed mood 10/24/2020  ? Hearing loss of right ear 09/04/2020  ? Hyperlipidemia associated with type 2 diabetes mellitus ([place]) 02/21/2020  ? Insomnia 02/21/2020  ? Vitamin D deficiency 02/21/2020  ? Degenerative disc disease, lumbar 02/21/2020  ? Diabetic polyneuropathy associated with diabetes mellitus due to underlying condition ([place]) 06/14/2019  ? Special screening for malignant neoplasms, colon 04/11/2018  ? Memory changes 11/23/2017  ? RLS (restless legs syndrome) 03/17/2017  ? ADHD (attention deficit hyperactivity disorder), 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  ? Reactive airway disease 08/21/2016  ? Gastroesophageal reflux disease without esophagitis 12/20/2015  ? Physical exam 12/14/2014  ? Breast mass, left 02/16/2014  ? Depression with anxiety 07/17/2013  ? Essential hypertension 07/17/2013  ? Eustachian tube dysfunction 07/17/2013  ? Adult BMI 50.0-59.9 kg/sq m ([place]) 07/17/2013  ?  ?Past Medical History:  ?Diagnosis Date  ? Allergy   ? Anxiety   ? Arthritis   ? HANDS  ? Asthma   ? HAS MDI  ? Complication of anesthesia   ? Depression   ? Diabetes mellitus without complication ([place])   ? Difficult airway  for intubation   ? 2013 used Video Laryngoscope  ? GERD (gastroesophageal reflux disease)   ? Herpes   ? Hyperlipidemia   ? Hypertension   ? RLS (restless legs syndrome) 03/17/2017  ? Right leg  ?  ?Family History  ?Problem Relation Age of Onset  ? Hypertension Mother   ? Clotting disorder Mother   ? Stroke Father   ?  Hypertension Paternal Grandmother   ? Diabetes Mellitus II Paternal Grandmother   ? Lupus Paternal Grandmother   ? Healthy Daughter   ? Healthy Son   ? Colon cancer Neg Hx   ? Esophageal cancer Neg Hx   ? Pancreatic cancer Neg Hx   ? Stomach cancer Neg Hx   ? Liver disease Neg Hx   ? Colon polyps Neg Hx   ? ?Past Surgical History:  ?Procedure Laterality Date  ? ABDOMINAL HYSTERECTOMY    ? COLONOSCOPY WITH PROPOFOL N/A 07/09/2021  ? Procedure: COLONOSCOPY WITH PROPOFOL;  Surgeon: Mauri Pole, MD;  Location: [facility];  Service: Endoscopy;  Laterality: N/A;  ? DILATION AND CURETTAGE OF UTERUS    ? POLYPECTOMY  07/09/2021  ? Procedure: POLYPECTOMY;  Surgeon: Mauri Pole, MD;  Location: [facility];  Service: Endoscopy;;  ? TUBAL LIGATION    ? ?Social History  ? ?Social History Narrative  ? Lives with husband and 2 children in a 2 story home.    ? Works as a Chartered certified accountant at [employer].    ? Highest level of education:  High school, in college now  ? ?Immunization History  ?Administered Date(s) Administered  ? Influenza,inj,Quad PF,6+ Mos 07/17/2013, 07/27/2014, 07/26/2015, 08/21/2016, 07/06/2017, 07/29/2018, 07/03/2019  ? Influenza-Unspecified 08/20/2020, 08/19/2021  ? MMR 03/08/2017, 04/05/2017  ? Moderna Sars-Covid-2 Vaccination 06/06/2020, 07/04/2020  ? PPD Test 07/26/2015, 03/07/2018, 05/23/2018  ? Pneumococcal Polysaccharide-23 01/16/2015  ? Tdap 10/14/2015  ?  ? ?Objective: ?Vital Signs: BP (!) 172/88 (BP Location: Left Wrist, Patient Position: Sitting, Cuff Size: Normal)   Pulse 77   Ht 5' 5" (1.651 m)   Wt (!) 327 lb 9.6 oz (148.6 kg)   LMP 12/04/2011   BMI 54.52 kg/m?   ? ?Physical Exam ?Vitals and nursing note reviewed.  ?Constitutional:   ?   Appearance: She is well-developed.  ?HENT:  ?   Head: Normocephalic and atraumatic.  ?Eyes:  ?   Conjunctiva/sclera: Conjunctivae normal.  ?Cardiovascular:  ?   Rate and Rhythm: Normal rate and regular rhythm.  ?   Heart sounds: Normal heart sounds.   ?Pulmonary:  ?   Effort: Pulmonary effort is normal.  ?   Breath sounds: Normal breath sounds.  ?Abdominal:  ?   General: Bowel sounds are normal.  ?   Palpations: Abdomen is soft.  ?Musculoskeletal:  ?   Cervical back: Normal range of motion.  ?Lymphadenopathy:  ?   Cervical: No cervical adenopathy.  ?Skin: ?   General: Skin is warm and dry.  ?   Capillary Refill: Capillary refill takes less than 2 seconds.  ?Neurological:  ?   Mental Status: She is alert and oriented to person, place, and time.  ?Psychiatric:     ?   Behavior: Behavior normal.  ?  ? ?Musculoskeletal Exam: C-spine was in good range of motion.  She had discomfort range of motion of her lumbar spine.  Shoulder joints, elbow joints, wrist joints, MCPs PIPs and DIPs with good range of motion with no synovitis.  Hip joints, knee joints, ankles, MTPs  and PIPs with good range of motion with no synovitis. ? ?CDAI Exam: ?CDAI Score: -- ?Patient Global: --; Provider Global: -- ?Swollen: --; Tender: -- ?Joint Exam 02/10/2022  ? ?No joint exam has been documented for this visit  ? ?There is currently no information documented on the homunculus. Go to the Rheumatology activity and complete the homunculus joint exam. ? ?Investigation: ?No additional findings. ? ?Imaging: ?No results found. ? ?Recent Labs: ?Lab Results  ?Component Value Date  ? WBC 7.0 11/28/2021  ? HGB 12.2 11/28/2021  ? PLT 308.0 11/28/2021  ? NA 140 11/28/2021  ? K 3.3 (L) 11/28/2021  ? CL 101 11/28/2021  ? CO2 30 11/28/2021  ? GLUCOSE 96 11/28/2021  ? BUN 10 11/28/2021  ? CREATININE 0.94 11/28/2021  ? BILITOT 0.3 11/28/2021  ? ALKPHOS 87 11/28/2021  ? AST 15 11/28/2021  ? ALT 13 11/28/2021  ? PROT 7.5 11/28/2021  ? ALBUMIN 4.0 11/28/2021  ? CALCIUM 9.4 11/28/2021  ? GFRAA 74 12/16/2020  ? QFTBGOLDPLUS NEGATIVE 07/03/2019  ? ? ?Speciality Comments: PLQ Eye Exam: 08/26/2020 [facility] @ [facility]  ?Eye exam  05/2021 per patient [facility] ? ?Procedures:  ?No procedures performed ?Allergies:  Penicillins  ? ?Assessment / Plan:     ?Visit Diagnoses: Autoimmune disease ([place]) - AVISE+ANA, +APS IgM positive:  -Patient gives history of fatigue, arthralgias, sicca symptoms, Raynaud's phenomenon.  No synovitis was

## 2022-01-31 ENCOUNTER — Encounter: Payer: Self-pay | Admitting: Family Medicine

## 2022-01-31 ENCOUNTER — Encounter: Payer: Self-pay | Admitting: Rheumatology

## 2022-02-02 NOTE — Telephone Encounter (Signed)
I do not see any contraindications for taking this supplement with plaquenil.

## 2022-02-10 ENCOUNTER — Ambulatory Visit: Payer: 59 | Admitting: Rheumatology

## 2022-02-10 ENCOUNTER — Encounter: Payer: Self-pay | Admitting: Rheumatology

## 2022-02-10 ENCOUNTER — Other Ambulatory Visit (HOSPITAL_COMMUNITY): Payer: Self-pay

## 2022-02-10 VITALS — BP 172/88 | HR 77 | Ht 65.0 in | Wt 327.6 lb

## 2022-02-10 DIAGNOSIS — Z8249 Family history of ischemic heart disease and other diseases of the circulatory system: Secondary | ICD-10-CM | POA: Diagnosis not present

## 2022-02-10 DIAGNOSIS — M3500 Sicca syndrome, unspecified: Secondary | ICD-10-CM

## 2022-02-10 DIAGNOSIS — E559 Vitamin D deficiency, unspecified: Secondary | ICD-10-CM

## 2022-02-10 DIAGNOSIS — R110 Nausea: Secondary | ICD-10-CM

## 2022-02-10 DIAGNOSIS — K219 Gastro-esophageal reflux disease without esophagitis: Secondary | ICD-10-CM

## 2022-02-10 DIAGNOSIS — M359 Systemic involvement of connective tissue, unspecified: Secondary | ICD-10-CM | POA: Diagnosis not present

## 2022-02-10 DIAGNOSIS — M5136 Other intervertebral disc degeneration, lumbar region: Secondary | ICD-10-CM

## 2022-02-10 DIAGNOSIS — L659 Nonscarring hair loss, unspecified: Secondary | ICD-10-CM

## 2022-02-10 DIAGNOSIS — M79642 Pain in left hand: Secondary | ICD-10-CM

## 2022-02-10 DIAGNOSIS — Z79899 Other long term (current) drug therapy: Secondary | ICD-10-CM | POA: Diagnosis not present

## 2022-02-10 DIAGNOSIS — E0842 Diabetes mellitus due to underlying condition with diabetic polyneuropathy: Secondary | ICD-10-CM

## 2022-02-10 DIAGNOSIS — F900 Attention-deficit hyperactivity disorder, predominantly inattentive type: Secondary | ICD-10-CM

## 2022-02-10 DIAGNOSIS — G2581 Restless legs syndrome: Secondary | ICD-10-CM

## 2022-02-10 DIAGNOSIS — R5383 Other fatigue: Secondary | ICD-10-CM

## 2022-02-10 DIAGNOSIS — F418 Other specified anxiety disorders: Secondary | ICD-10-CM

## 2022-02-10 DIAGNOSIS — J4541 Moderate persistent asthma with (acute) exacerbation: Secondary | ICD-10-CM

## 2022-02-10 DIAGNOSIS — I10 Essential (primary) hypertension: Secondary | ICD-10-CM

## 2022-02-10 DIAGNOSIS — M533 Sacrococcygeal disorders, not elsewhere classified: Secondary | ICD-10-CM | POA: Diagnosis not present

## 2022-02-10 DIAGNOSIS — R21 Rash and other nonspecific skin eruption: Secondary | ICD-10-CM

## 2022-02-10 DIAGNOSIS — G8929 Other chronic pain: Secondary | ICD-10-CM

## 2022-02-10 DIAGNOSIS — M79641 Pain in right hand: Secondary | ICD-10-CM | POA: Diagnosis not present

## 2022-02-10 DIAGNOSIS — Z87891 Personal history of nicotine dependence: Secondary | ICD-10-CM

## 2022-02-10 DIAGNOSIS — M47819 Spondylosis without myelopathy or radiculopathy, site unspecified: Secondary | ICD-10-CM

## 2022-02-10 MED ORDER — HYDROXYCHLOROQUINE SULFATE 200 MG PO TABS
ORAL_TABLET | Freq: Two times a day (BID) | ORAL | 0 refills | Status: DC
  Filled 2022-02-10: qty 180, fill #0

## 2022-02-10 MED ORDER — ONDANSETRON HCL 4 MG PO TABS
ORAL_TABLET | Freq: Four times a day (QID) | ORAL | 0 refills | Status: DC
  Filled 2022-02-10 – 2022-05-23 (×2): qty 60, 15d supply, fill #0

## 2022-02-11 LAB — COMPLETE METABOLIC PANEL WITH GFR
AG Ratio: 1.1 (calc) (ref 1.0–2.5)
ALT: 11 U/L (ref 6–29)
AST: 13 U/L (ref 10–35)
Albumin: 3.7 g/dL (ref 3.6–5.1)
Alkaline phosphatase (APISO): 79 U/L (ref 31–125)
BUN: 13 mg/dL (ref 7–25)
CO2: 30 mmol/L (ref 20–32)
Calcium: 9.4 mg/dL (ref 8.6–10.2)
Chloride: 101 mmol/L (ref 98–110)
Creat: 0.98 mg/dL (ref 0.50–0.99)
Globulin: 3.3 g/dL (calc) (ref 1.9–3.7)
Glucose, Bld: 118 mg/dL — ABNORMAL HIGH (ref 65–99)
Potassium: 3.7 mmol/L (ref 3.5–5.3)
Sodium: 141 mmol/L (ref 135–146)
Total Bilirubin: 0.3 mg/dL (ref 0.2–1.2)
Total Protein: 7 g/dL (ref 6.1–8.1)
eGFR: 71 mL/min/{1.73_m2} (ref >=60)

## 2022-02-11 LAB — PROTEIN / CREATININE RATIO, URINE
Creatinine, Urine: 245 mg/dL (ref 20–275)
Protein/Creat Ratio: 61 mg/g creat (ref 24–184)
Protein/Creatinine Ratio: 0.061 mg/mg creat (ref 0.024–0.184)
Total Protein, Urine: 15 mg/dL (ref 5–24)

## 2022-02-11 LAB — CBC WITH DIFFERENTIAL/PLATELET
Absolute Monocytes: 535 cells/uL (ref 200–950)
Basophils Absolute: 32 cells/uL (ref 0–200)
Basophils Relative: 0.4 %
Eosinophils Absolute: 251 cells/uL (ref 15–500)
Eosinophils Relative: 3.1 %
HCT: 37.2 % (ref 35.0–45.0)
Hemoglobin: 11.9 g/dL (ref 11.7–15.5)
Lymphs Abs: 2981 cells/uL (ref 850–3900)
MCH: 28.3 pg (ref 27.0–33.0)
MCHC: 32 g/dL (ref 32.0–36.0)
MCV: 88.6 fL (ref 80.0–100.0)
MPV: 9 fL (ref 7.5–12.5)
Monocytes Relative: 6.6 %
Neutro Abs: 4301 cells/uL (ref 1500–7800)
Neutrophils Relative %: 53.1 %
Platelets: 377 10*3/uL (ref 140–400)
RBC: 4.2 10*6/uL (ref 3.80–5.10)
RDW: 14.3 % (ref 11.0–15.0)
Total Lymphocyte: 36.8 %
WBC: 8.1 10*3/uL (ref 3.8–10.8)

## 2022-02-11 LAB — SEDIMENTATION RATE: Sed Rate: 48 mm/h — ABNORMAL HIGH (ref 0–20)

## 2022-02-11 LAB — C3 AND C4
C3 Complement: 180 mg/dL (ref 83–193)
C4 Complement: 59 mg/dL — ABNORMAL HIGH (ref 15–57)

## 2022-02-11 LAB — ANTI-DNA ANTIBODY, DOUBLE-STRANDED: ds DNA Ab: <1 IU/mL

## 2022-02-12 NOTE — Progress Notes (Signed)
CBC and CMP normal.  Glucose is mildly elevated, probably not a fasting sample.  Complements are normal.  Sed rate is elevated and stable.  Double-stranded DNA is negative.  Urine protein creatinine ratio is normal.

## 2022-02-18 ENCOUNTER — Other Ambulatory Visit (HOSPITAL_COMMUNITY): Payer: Self-pay

## 2022-02-25 ENCOUNTER — Ambulatory Visit: Payer: 59 | Admitting: Specialist

## 2022-02-25 ENCOUNTER — Ambulatory Visit (INDEPENDENT_AMBULATORY_CARE_PROVIDER_SITE_OTHER): Payer: 59

## 2022-02-25 ENCOUNTER — Other Ambulatory Visit (HOSPITAL_COMMUNITY): Payer: Self-pay

## 2022-02-25 ENCOUNTER — Encounter: Payer: Self-pay | Admitting: Specialist

## 2022-02-25 VITALS — BP 145/82 | HR 75 | Ht 65.0 in | Wt 318.8 lb

## 2022-02-25 DIAGNOSIS — M4316 Spondylolisthesis, lumbar region: Secondary | ICD-10-CM | POA: Diagnosis not present

## 2022-02-25 DIAGNOSIS — G5601 Carpal tunnel syndrome, right upper limb: Secondary | ICD-10-CM | POA: Diagnosis not present

## 2022-02-25 DIAGNOSIS — M5136 Other intervertebral disc degeneration, lumbar region: Secondary | ICD-10-CM

## 2022-02-25 DIAGNOSIS — G5602 Carpal tunnel syndrome, left upper limb: Secondary | ICD-10-CM

## 2022-02-25 MED ORDER — TRAMADOL-ACETAMINOPHEN 37.5-325 MG PO TABS
1.0000 | ORAL_TABLET | Freq: Four times a day (QID) | ORAL | 0 refills | Status: DC | PRN
  Filled 2022-02-25: qty 30, 8d supply, fill #0

## 2022-02-25 MED ORDER — CYCLOBENZAPRINE HCL 10 MG PO TABS
ORAL_TABLET | Freq: Every day | ORAL | 0 refills | Status: DC
  Filled 2022-02-25: qty 30, 30d supply, fill #0

## 2022-02-25 NOTE — Progress Notes (Signed)
?  ? ?Office Visit Note ?  ?Patient: Vanessa Reeves           ?Date of Birth: [birth year]           ?MRN: [id number] ?Visit Date: 02/25/2022 ?             ?Requested by: Midge Minium, MD ?[address] ?[place],  [place] [address] ?PCP: Midge Minium, MD ? ? ?Assessment & Plan: ?Visit Diagnoses:  ?1. Spondylolisthesis, lumbar region   ?2. Degenerative disc disease, lumbar   ? ? ?Plan: Avoid frequent bending and stooping  ?No lifting greater than 10 lbs. ?May use ice or moist heat for pain. ?Weight loss is of benefit. ?Best medication for lumbar disc disease is arthritis medications like motrin, celebrex and naprosyn. ?Exercise is important to improve your indurance and does allow people to function better inspite of back pain. ?Carpal Tunnel Syndrome ? ?Carpal tunnel syndrome is a condition that causes pain in your hand and arm. The carpal tunnel is a narrow area located on the palm side of your wrist. Repeated wrist motion or certain diseases may cause swelling within the tunnel. This swelling pinches the main nerve in the wrist (median nerve). ?What are the causes? ?This condition may be caused by: ?Repeated wrist motions. ?Wrist injuries. ?Arthritis. ?A cyst or tumor in the carpal tunnel. ?Fluid buildup during pregnancy. ?Sometimes the cause of this condition is not known. ?What increases the risk? ?This condition is more likely to develop in: ?People who have jobs that cause them to repeatedly move their wrists in the same motion, such as Art gallery manager. ?Women. ?People with certain conditions, such as: ?Diabetes. ?Obesity. ?An underactive thyroid (hypothyroidism). ?Kidney failure. ?What are the signs or symptoms? ?Symptoms of this condition include: ?A tingling feeling in your fingers, especially in your thumb, index, and middle fingers. ?Tingling or numbness in your hand. ?An aching feeling in your entire arm, especially when your wrist and elbow are bent for long periods of time. ?Wrist  pain that goes up your arm to your shoulder. ?Pain that goes down into your palm or fingers. ?A weak feeling in your hands. You may have trouble grabbing and holding items. ?Your symptoms may feel worse during the night. ?How is this diagnosed? ?This condition is diagnosed with a medical history and physical exam. You may also have tests, including: ?An electromyogram (EMG). This test measures electrical signals sent by your nerves into the muscles. ?X-rays. ?How is this treated? ?Treatment for this condition includes: ?Lifestyle changes. It is important to stop doing or modify the activity that caused your condition. ?Physical or occupational therapy. ?Medicines for pain and inflammation. This may include medicine that is injected into your wrist. ?A wrist splint. ?Surgery. ?Follow these instructions at home: ?If you have a splint:  ?Wear it as told by your health care provider. Remove it only as told by your health care provider. ?Loosen the splint if your fingers become numb and tingle, or if they turn cold and blue. ?Keep the splint clean and dry. ?General instructions  ?Take over-the-counter and prescription medicines only as told by your health care provider. ?Rest your wrist from any activity that may be causing your pain. If your condition is work related, talk to your employer about changes that can be made, such as getting a wrist pad to use while typing. ?If directed, apply ice to the painful area: ?Put ice in a plastic bag. ?Place a towel between  your skin and the bag. ?Leave the ice on for 20 minutes, 2-3 times per day. ?Keep all follow-up visits as told by your health care provider. This is important. ?Do any exercises as told by your health care provider, physical therapist, or occupational therapist. ?Contact a health care provider if: ?You have new symptoms. ?Your pain is not controlled with medicines. ?Your symptoms get worse. ?This information is not intended to replace advice given to you by your  health care provider. Make sure you discuss any questions you have with your health care provider. ?Document Released: 10/16/2000 Document Revised: 02/27/2016 Document Reviewed: 06/30/2017 ?Elsevier Interactive Patient Education ? 2017 [place].  ? ?Follow-Up Instructions: No follow-ups on file.  ? ?Orders:  ?Orders Placed This Encounter  ?Procedures  ? XR Lumbar Spine 2-3 Views  ? ?No orders of the defined types were placed in this encounter. ? ? ? ? Procedures: ?No procedures performed ? ? ?Clinical Data: ?No additional findings. ? ? ?Subjective: ?Chief Complaint  ?Patient presents with  ? Lower Back - Follow-up  ? ? ?51 year old female with history of lumbar pain and pain in the legs with standing and walking. She is on plaquinel for about 2 years. Her sed rate is in the 40-48 range with some elevation of  ?C4. She is working and alternating sitting standing and walking. No bowel or bladder difficulty. She is on a weight reduction program on her own with weight watchers. She reports it works go, for her and she is reducing carbohydrate intake. Has numbness in the right hand started about ?4 months ago, into the left Index and long finger. She has no neck. No history of previous EMG/NCV. She has diabetes, last check of her blood sugars was HgbA1c 6.9. Numbness left hand is greater at night. ? ? ?Review of Systems  ?Constitutional:  Positive for activity change.  ?HENT: Negative.    ?Eyes: Negative.   ?Respiratory: Negative.    ?Cardiovascular: Negative.   ?Gastrointestinal: Negative.   ?Endocrine: Negative.   ?Genitourinary: Negative.   ?Musculoskeletal: Negative.   ?Skin: Negative.   ?Neurological:  Positive for numbness. Negative for dizziness, tremors, seizures, syncope, facial asymmetry, speech difficulty, weakness, light-headedness and headaches.  ? ? ?Objective: ?Vital Signs: BP (!) 145/82 (BP Location: Left Arm, Patient Position: Sitting)   Pulse 75   Ht '5\' 5"'$  (1.651 m)   Wt (!) 318 lb 12.8 oz (144.6  kg)   LMP 12/04/2011   BMI 53.05 kg/m?  ? ?Physical Exam ?Musculoskeletal:  ?   Lumbar back: Negative right straight leg raise test and negative left straight leg raise test.  ? ? ?Back Exam  ? ?Tenderness  ?The patient is experiencing tenderness in the lumbar. ? ?Range of Motion  ?Extension:  abnormal  ?Flexion:  abnormal  ?Lateral bend right:  abnormal  ?Lateral bend left:  abnormal  ?Rotation right:  abnormal  ?Rotation left:  abnormal  ? ?Muscle Strength  ?Right Quadriceps:  5/5  ?Left Quadriceps:  5/5  ?Right Hamstrings:  5/5  ?Left Hamstrings:  5/5  ? ?Tests  ?Straight leg raise right: negative ?Straight leg raise left: negative ? ?Reflexes  ?Patellar:  0/4 ?Achilles:  0/4 ?Biceps:  0/4 ?Babinski's sign: normal  ? ?Other  ?Toe walk: normal ?Heel walk: normal ?Sensation: normal ?Gait: normal  ?Erythema: no back redness ?Scars: present ? ? ?Right Hand Exam  ? ?Tests  ?Tinel's sign (median nerve): positive ? ? ?Left Hand Exam  ? ?Tests  ?Phalen?s  sign: positive ?Tinel's sign (median nerve): positive ? ? ? ? ?Specialty Comments:  ?No specialty comments available. ? ?Imaging: ?No results found. ? ? ?PMFS History: ?Patient Active Problem List  ? Diagnosis Date Noted  ? Polyp of transverse colon   ? Polyp of sigmoid colon   ? Polyp of rectum   ? Adjustment disorder with depressed mood 10/24/2020  ? Hearing loss of right ear 09/04/2020  ? Hyperlipidemia associated with type 2 diabetes mellitus ([place]) 02/21/2020  ? Insomnia 02/21/2020  ? Vitamin D deficiency 02/21/2020  ? Degenerative disc disease, lumbar 02/21/2020  ? Diabetic polyneuropathy associated with diabetes mellitus due to underlying condition ([place]) 06/14/2019  ? Special screening for malignant neoplasms, colon 04/11/2018  ? Memory changes 11/23/2017  ? RLS (restless legs syndrome) 03/17/2017  ? ADHD (attention deficit hyperactivity disorder), 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  ? Reactive airway disease 08/21/2016  ? Gastroesophageal reflux disease without  esophagitis 12/20/2015  ? Physical exam 12/14/2014  ? Breast mass, left 02/16/2014  ? Depression with anxiety 07/17/2013  ? Essential hypertension 07/17/2013  ? Eustachian tube dysfunction 07/17/2013  ? Adult B

## 2022-02-25 NOTE — Patient Instructions (Signed)
Plan: Avoid frequent bending and stooping  ?No lifting greater than 10 lbs. ?May use ice or moist heat for pain. ?Weight loss is of benefit. ?Best medication for lumbar disc disease is arthritis medications like motrin, celebrex and naprosyn. ?Exercise is important to improve your indurance and does allow people to function better inspite of back pain. ?Carpal Tunnel Syndrome ? ?Carpal tunnel syndrome is a condition that causes pain in your hand and arm. The carpal tunnel is a narrow area located on the palm side of your wrist. Repeated wrist motion or certain diseases may cause swelling within the tunnel. This swelling pinches the main nerve in the wrist (median nerve). ?What are the causes? ?This condition may be caused by: ?Repeated wrist motions. ?Wrist injuries. ?Arthritis. ?A cyst or tumor in the carpal tunnel. ?Fluid buildup during pregnancy. ?Sometimes the cause of this condition is not known. ?What increases the risk? ?This condition is more likely to develop in: ?People who have jobs that cause them to repeatedly move their wrists in the same motion, such as Art gallery manager. ?Women. ?People with certain conditions, such as: ?Diabetes. ?Obesity. ?An underactive thyroid (hypothyroidism). ?Kidney failure. ?What are the signs or symptoms? ?Symptoms of this condition include: ?A tingling feeling in your fingers, especially in your thumb, index, and middle fingers. ?Tingling or numbness in your hand. ?An aching feeling in your entire arm, especially when your wrist and elbow are bent for long periods of time. ?Wrist pain that goes up your arm to your shoulder. ?Pain that goes down into your palm or fingers. ?A weak feeling in your hands. You may have trouble grabbing and holding items. ?Your symptoms may feel worse during the night. ?How is this diagnosed? ?This condition is diagnosed with a medical history and physical exam. You may also have tests, including: ?An electromyogram (EMG). This test measures  electrical signals sent by your nerves into the muscles. ?X-rays. ?How is this treated? ?Treatment for this condition includes: ?Lifestyle changes. It is important to stop doing or modify the activity that caused your condition. ?Physical or occupational therapy. ?Medicines for pain and inflammation. This may include medicine that is injected into your wrist. ?A wrist splint. ?Surgery. ?Follow these instructions at home: ?If you have a splint:  ?Wear it as told by your health care provider. Remove it only as told by your health care provider. ?Loosen the splint if your fingers become numb and tingle, or if they turn cold and blue. ?Keep the splint clean and dry. ?General instructions  ?Take over-the-counter and prescription medicines only as told by your health care provider. ?Rest your wrist from any activity that may be causing your pain. If your condition is work related, talk to your employer about changes that can be made, such as getting a wrist pad to use while typing. ?If directed, apply ice to the painful area: ?Put ice in a plastic bag. ?Place a towel between your skin and the bag. ?Leave the ice on for 20 minutes, 2-3 times per day. ?Keep all follow-up visits as told by your health care provider. This is important. ?Do any exercises as told by your health care provider, physical therapist, or occupational therapist. ?Contact a health care provider if: ?You have new symptoms. ?Your pain is not controlled with medicines. ?Your symptoms get worse. ?This information is not intended to replace advice given to you by your health care provider. Make sure you discuss any questions you have with your health care provider. ?Document Released: 10/16/2000  Document Revised: 02/27/2016 Document Reviewed: 06/30/2017 ?Elsevier Interactive Patient Education ? 2017 [place].  ?

## 2022-02-26 ENCOUNTER — Other Ambulatory Visit: Payer: Self-pay | Admitting: Family Medicine

## 2022-02-26 ENCOUNTER — Other Ambulatory Visit (HOSPITAL_COMMUNITY): Payer: Self-pay

## 2022-02-26 LAB — HM DIABETES EYE EXAM

## 2022-02-27 ENCOUNTER — Other Ambulatory Visit (HOSPITAL_COMMUNITY): Payer: Self-pay

## 2022-02-27 MED ORDER — LOSARTAN POTASSIUM 50 MG PO TABS
ORAL_TABLET | Freq: Every day | ORAL | 1 refills | Status: DC
  Filled 2022-02-27: qty 60, 30d supply, fill #0
  Filled 2022-04-20: qty 60, 30d supply, fill #1

## 2022-03-09 ENCOUNTER — Encounter: Payer: Self-pay | Admitting: Family Medicine

## 2022-03-12 ENCOUNTER — Ambulatory Visit: Payer: 59 | Admitting: Family Medicine

## 2022-03-16 ENCOUNTER — Encounter: Payer: Self-pay | Admitting: Family Medicine

## 2022-03-16 ENCOUNTER — Ambulatory Visit: Payer: 59 | Admitting: Family Medicine

## 2022-03-16 VITALS — BP 130/80 | HR 68 | Temp 98.6°F | Resp 16 | Ht 65.0 in | Wt 326.6 lb

## 2022-03-16 DIAGNOSIS — F4321 Adjustment disorder with depressed mood: Secondary | ICD-10-CM | POA: Diagnosis not present

## 2022-03-16 DIAGNOSIS — E0842 Diabetes mellitus due to underlying condition with diabetic polyneuropathy: Secondary | ICD-10-CM

## 2022-03-16 LAB — HEMOGLOBIN A1C: Hgb A1c MFr Bld: 6.6 % — ABNORMAL HIGH (ref 4.6–6.5)

## 2022-03-16 NOTE — Patient Instructions (Signed)
Follow up in 3-4 months to recheck sugar, BP, cholesterol ?We'll notify you of your lab results and make any changes if needed ?Make sure you are taking time for yourself!!! ?Try and get regular physical activity.  As crazy as it sounds, it will actually give you more energy ?Continue to work on low carb/low sugar diet- you can do it!!! ?Call with any questions or concerns ?Hang in there!!! ?

## 2022-03-16 NOTE — Progress Notes (Signed)
? ?  Subjective:  ? ? Patient ID: Vanessa Reeves, female    DOB: 05-31-1971, 51 y.o.   MRN: [id number] ? ?HPI ?DM- chronic problem, on Metformin XR '500mg'$  BID.  On Losartan for renal protection.  UTD on eye exam, foot exam.  Pt has gained 7 lbs.  Pt reports ongoing fatigue.  Works 'a lot of hours', lost her mom around the holidays.  Has not had time to grieve- is still trying to process everything.  No CP, SOB, visual changes, abd pain, N/V.  Denies symptomatic lows.  No numbness/tingling of feet.  Has carpal tunnel of both wrists. ? ? ?Review of Systems ?For ROS see HPI  ?   ?Objective:  ? Physical Exam ?Vitals reviewed.  ?Constitutional:   ?   General: She is not in acute distress. ?   Appearance: She is well-developed. She is obese. She is not ill-appearing.  ?HENT:  ?   Head: Normocephalic and atraumatic.  ?Eyes:  ?   Conjunctiva/sclera: Conjunctivae normal.  ?   Pupils: Pupils are equal, round, and reactive to light.  ?Neck:  ?   Thyroid: No thyromegaly.  ?Cardiovascular:  ?   Rate and Rhythm: Normal rate and regular rhythm.  ?   Pulses: Normal pulses.  ?   Heart sounds: Normal heart sounds. No murmur heard. ?Pulmonary:  ?   Effort: Pulmonary effort is normal. No respiratory distress.  ?   Breath sounds: Normal breath sounds.  ?Abdominal:  ?   General: There is no distension.  ?   Palpations: Abdomen is soft.  ?   Tenderness: There is no abdominal tenderness.  ?Musculoskeletal:  ?   Cervical back: Normal range of motion and neck supple.  ?   Right lower leg: No edema.  ?   Left lower leg: No edema.  ?Lymphadenopathy:  ?   Cervical: No cervical adenopathy.  ?Skin: ?   General: Skin is warm and dry.  ?Neurological:  ?   General: No focal deficit present.  ?   Mental Status: She is alert and oriented to person, place, and time.  ?Psychiatric:     ?   Mood and Affect: Mood normal.     ?   Behavior: Behavior normal.  ? ? ? ? ? ?   ?Assessment & Plan:  ? ? ?

## 2022-03-16 NOTE — Assessment & Plan Note (Signed)
Chronic problem.  Currently on Metformin XR '500mg'$  BID.  On ARB for renal protection, UTD on eye exam, foot exam.  Unfortunately she has gained 7 lbs since last visit.  Admits to poor eating and little exercise due to recent stress of losing her mother and having to handle everything from her care prior to death and now her estate.  Stressed need for low carb diet and regular physical activity- both for weight loss and stress relief.  Check labs.  Adjust meds prn  ?

## 2022-03-17 ENCOUNTER — Telehealth: Payer: Self-pay

## 2022-03-17 LAB — BASIC METABOLIC PANEL
BUN: 14 mg/dL (ref 6–23)
CO2: 26 mEq/L (ref 19–32)
Calcium: 9.1 mg/dL (ref 8.4–10.5)
Chloride: 102 mEq/L (ref 96–112)
Creatinine, Ser: 0.82 mg/dL (ref 0.40–1.20)
GFR: 84 mL/min (ref >60.00)
Glucose, Bld: 90 mg/dL (ref 70–99)
Potassium: 3.8 mEq/L (ref 3.5–5.1)
Sodium: 139 mEq/L (ref 135–145)

## 2022-03-17 NOTE — Telephone Encounter (Signed)
-----   Message from Midge Minium, MD sent at 03/17/2022  7:19 AM EDT ----- ?Labs look good!  No changes at this time ?

## 2022-03-17 NOTE — Telephone Encounter (Signed)
Patient returned call and she is aware of the lab results ?

## 2022-03-23 ENCOUNTER — Ambulatory Visit: Payer: 59 | Admitting: Physician Assistant

## 2022-03-26 ENCOUNTER — Ambulatory Visit: Payer: 59 | Admitting: Physician Assistant

## 2022-04-02 DIAGNOSIS — E119 Type 2 diabetes mellitus without complications: Secondary | ICD-10-CM | POA: Diagnosis not present

## 2022-04-02 DIAGNOSIS — M3500 Sicca syndrome, unspecified: Secondary | ICD-10-CM | POA: Diagnosis not present

## 2022-04-02 DIAGNOSIS — Z79899 Other long term (current) drug therapy: Secondary | ICD-10-CM | POA: Diagnosis not present

## 2022-04-02 DIAGNOSIS — M3210 Systemic lupus erythematosus, organ or system involvement unspecified: Secondary | ICD-10-CM | POA: Diagnosis not present

## 2022-04-02 LAB — HM DIABETES EYE EXAM

## 2022-04-20 ENCOUNTER — Other Ambulatory Visit (HOSPITAL_COMMUNITY): Payer: Self-pay

## 2022-04-20 ENCOUNTER — Other Ambulatory Visit: Payer: Self-pay | Admitting: Family Medicine

## 2022-04-20 MED ORDER — HYDROCHLOROTHIAZIDE 25 MG PO TABS
ORAL_TABLET | Freq: Every day | ORAL | 0 refills | Status: DC
  Filled 2022-04-20: qty 90, 90d supply, fill #0

## 2022-04-20 MED ORDER — FUROSEMIDE 20 MG PO TABS
ORAL_TABLET | Freq: Every day | ORAL | 1 refills | Status: DC
  Filled 2022-04-20: qty 90, fill #0
  Filled 2022-05-01: qty 90, 90d supply, fill #0
  Filled 2022-10-02: qty 90, 90d supply, fill #1

## 2022-04-21 ENCOUNTER — Other Ambulatory Visit (HOSPITAL_COMMUNITY): Payer: Self-pay

## 2022-05-01 ENCOUNTER — Other Ambulatory Visit (HOSPITAL_COMMUNITY): Payer: Self-pay

## 2022-05-03 ENCOUNTER — Other Ambulatory Visit: Payer: Self-pay | Admitting: Family Medicine

## 2022-05-04 ENCOUNTER — Other Ambulatory Visit (HOSPITAL_COMMUNITY): Payer: Self-pay

## 2022-05-04 MED ORDER — ATENOLOL 50 MG PO TABS
ORAL_TABLET | Freq: Every day | ORAL | 1 refills | Status: DC
  Filled 2022-05-04: qty 180, 90d supply, fill #0
  Filled 2022-08-26: qty 180, 90d supply, fill #1

## 2022-05-12 ENCOUNTER — Other Ambulatory Visit: Payer: Self-pay | Admitting: Specialist

## 2022-05-12 DIAGNOSIS — M5136 Other intervertebral disc degeneration, lumbar region: Secondary | ICD-10-CM

## 2022-05-12 DIAGNOSIS — M51369 Other intervertebral disc degeneration, lumbar region without mention of lumbar back pain or lower extremity pain: Secondary | ICD-10-CM

## 2022-05-15 ENCOUNTER — Other Ambulatory Visit: Payer: Self-pay | Admitting: Specialist

## 2022-05-15 ENCOUNTER — Other Ambulatory Visit (HOSPITAL_COMMUNITY): Payer: Self-pay

## 2022-05-15 MED ORDER — CYCLOBENZAPRINE HCL 10 MG PO TABS
ORAL_TABLET | Freq: Every day | ORAL | 0 refills | Status: DC
  Filled 2022-05-15: qty 30, 30d supply, fill #0

## 2022-05-15 MED ORDER — TRAMADOL-ACETAMINOPHEN 37.5-325 MG PO TABS
1.0000 | ORAL_TABLET | Freq: Four times a day (QID) | ORAL | 0 refills | Status: DC | PRN
  Filled 2022-05-15: qty 30, 8d supply, fill #0

## 2022-05-23 ENCOUNTER — Other Ambulatory Visit (HOSPITAL_COMMUNITY): Payer: Self-pay

## 2022-06-01 ENCOUNTER — Encounter: Payer: Self-pay | Admitting: Specialist

## 2022-06-02 ENCOUNTER — Encounter: Payer: Self-pay | Admitting: Radiology

## 2022-06-08 ENCOUNTER — Other Ambulatory Visit: Payer: Self-pay | Admitting: Family Medicine

## 2022-06-08 ENCOUNTER — Other Ambulatory Visit (HOSPITAL_COMMUNITY): Payer: Self-pay

## 2022-06-08 MED ORDER — LOSARTAN POTASSIUM 50 MG PO TABS
ORAL_TABLET | Freq: Every day | ORAL | 1 refills | Status: DC
Start: 2022-06-08 — End: 2022-08-26
  Filled 2022-06-08: qty 60, 30d supply, fill #0
  Filled 2022-07-23: qty 60, 30d supply, fill #1

## 2022-06-15 ENCOUNTER — Other Ambulatory Visit (HOSPITAL_COMMUNITY): Payer: Self-pay

## 2022-06-15 MED ORDER — CLINDAMYCIN HCL 150 MG PO CAPS
ORAL_CAPSULE | ORAL | 0 refills | Status: DC
  Filled 2022-06-15: qty 40, 10d supply, fill #0

## 2022-06-15 MED ORDER — IBUPROFEN 800 MG PO TABS
ORAL_TABLET | ORAL | 0 refills | Status: DC
  Filled 2022-06-15: qty 16, 4d supply, fill #0

## 2022-06-16 ENCOUNTER — Ambulatory Visit: Payer: 59 | Admitting: Family Medicine

## 2022-06-22 ENCOUNTER — Ambulatory Visit: Payer: 59 | Admitting: Family Medicine

## 2022-06-30 NOTE — Progress Notes (Unsigned)
Office Visit Note  Patient: Vanessa Reeves             Date of Birth: 1971-08-16           MRN: [id number]             PCP: Midge Minium, MD Referring: Midge Minium, MD Visit Date: 07/14/2022 Occupation: @GUAROCC @  Subjective:  Brain fog   History of Present Illness: Vanessa Reeves is a 51 y.o. female with history of autoimmune disease and DDD.  Patient is taking Plaquenil 200 mg 1 tablet by mouth twice daily.  She is tolerating Plaquenil without any side effects.  Patient continues to have chronic sicca symptoms.  She had a recent follow-up visit with her dentist and continues to see her dentist every 6 months.  Her dentist recommended the use of XyliMelts as well as G.U.M hydral dry mouth relief.  She sees her ophthalmologist on a yearly basis.  She has not been using any over-the-counter eyedrops for symptomatic relief.  She continues to experience intermittent oral ulcerations and nose dryness.  She denies any cervical lymphadenopathy.  She denies any facial rashes.  She is currently using a topical antifungal for fungal dermatitis under her chin/neck.  She denies any other recent rashes.  She has an upcoming cruise scheduled in October and is concerned about sun exposure.  She denies any SOB or pleuritic chest pain.  She states her biggest concern has been ongoing brain fog and fatigue.     Activities of Daily Living:  Patient reports morning stiffness for 5 minutes.   Patient Reports nocturnal pain.  Difficulty dressing/grooming: Denies Difficulty climbing stairs: Reports Difficulty getting out of chair: Reports Difficulty using hands for taps, buttons, cutlery, and/or writing: Reports  Review of Systems  Constitutional:  Positive for fatigue.  HENT:  Positive for mouth sores and mouth dryness. Negative for nose dryness.   Eyes:  Positive for dryness. Negative for pain and visual disturbance.  Respiratory:  Negative for cough, hemoptysis, shortness of breath and  difficulty breathing.   Cardiovascular:  Negative for chest pain, palpitations, hypertension and swelling in legs/feet.  Gastrointestinal:  Negative for blood in stool, constipation and diarrhea.  Endocrine: Negative for increased urination.  Genitourinary:  Negative for painful urination and involuntary urination.  Musculoskeletal:  Positive for joint pain, joint pain, joint swelling, myalgias, morning stiffness, muscle tenderness and myalgias. Negative for gait problem and muscle weakness.  Skin:  Positive for color change, rash and hair loss. Negative for pallor, nodules/bumps, skin tightness, ulcers and sensitivity to sunlight.  Allergic/Immunologic: Positive for susceptible to infections.  Neurological:  Negative for dizziness, numbness, headaches and weakness.  Hematological:  Negative for swollen glands.  Psychiatric/Behavioral:  Positive for sleep disturbance. Negative for depressed mood. The patient is not nervous/anxious.     PMFS History:  Patient Active Problem List   Diagnosis Date Noted   Polyp of transverse colon    Polyp of sigmoid colon    Polyp of rectum    Adjustment disorder with depressed mood 10/24/2020   Hearing loss of right ear 09/04/2020   Hyperlipidemia associated with type 2 diabetes mellitus ([place]) 02/21/2020   Insomnia 02/21/2020   Vitamin D deficiency 02/21/2020   Degenerative disc disease, lumbar 02/21/2020   Diabetic polyneuropathy associated with diabetes mellitus due to underlying condition ([place]) 06/14/2019   Special screening for malignant neoplasms, colon 04/11/2018   Memory changes 11/23/2017   RLS (restless legs syndrome) 03/17/2017   ADHD (  attention deficit hyperactivity disorder), 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   Reactive airway disease 08/21/2016   Gastroesophageal reflux disease without esophagitis 12/20/2015   Physical exam 12/14/2014   Breast mass, left 02/16/2014   Depression with anxiety 07/17/2013   Essential hypertension 07/17/2013    Eustachian tube dysfunction 07/17/2013   Adult BMI 50.0-59.9 kg/sq m ([place]) 07/17/2013    Past Medical History:  Diagnosis Date   Allergy    Anxiety    Arthritis    HANDS   Asthma    HAS MDI   Complication of anesthesia    Depression    Diabetes mellitus without complication ([place])    Difficult airway for intubation    2013 used Video Laryngoscope   GERD (gastroesophageal reflux disease)    Herpes    Hyperlipidemia    Hypertension    RLS (restless legs syndrome) 03/17/2017   Right leg    Family History  Problem Relation Age of Onset   Hypertension Mother    Clotting disorder Mother    Stroke Father    Hypertension Paternal Grandmother    Diabetes Mellitus II Paternal Grandmother    Lupus Paternal Grandmother    Healthy Daughter    Healthy Son    Colon cancer Neg Hx    Esophageal cancer Neg Hx    Pancreatic cancer Neg Hx    Stomach cancer Neg Hx    Liver disease Neg Hx    Colon polyps Neg Hx    Past Surgical History:  Procedure Laterality Date   ABDOMINAL HYSTERECTOMY     COLONOSCOPY WITH PROPOFOL N/A 07/09/2021   Procedure: COLONOSCOPY WITH PROPOFOL;  Surgeon: Mauri Pole, MD;  Location: [facility];  Service: Endoscopy;  Laterality: N/A;   DILATION AND CURETTAGE OF UTERUS     POLYPECTOMY  07/09/2021   Procedure: POLYPECTOMY;  Surgeon: Mauri Pole, MD;  Location: [facility];  Service: Endoscopy;;   TUBAL LIGATION     Social History   Social History Narrative   Lives with husband and 2 children in a 2 story home.     Works as a Chartered certified accountant at [employer].     Highest level of education:  High school, in college now   Immunization History  Administered Date(s) Administered   Influenza,inj,Quad PF,6+ Mos 07/17/2013, 07/27/2014, 07/26/2015, 08/21/2016, 07/06/2017, 07/29/2018, 07/03/2019, 07/07/2022   Influenza-Unspecified 08/20/2020, 08/19/2021   MMR 03/08/2017, 04/05/2017   Moderna Sars-Covid-2 Vaccination 06/06/2020, 07/04/2020   PPD Test  07/26/2015, 03/07/2018, 05/23/2018   Pneumococcal Polysaccharide-23 01/16/2015   Tdap 10/14/2015     Objective: Vital Signs: BP (!) 170/92 (BP Location: Left Wrist, Patient Position: Sitting, Cuff Size: Normal)   Pulse 67   Resp 16   Ht $R'5\' 5"'OT$  (1.651 m)   Wt (!) 339 lb 12.8 oz (154.1 kg)   LMP 12/04/2011   BMI 56.55 kg/m    Physical Exam Vitals and nursing note reviewed.  Constitutional:      Appearance: She is well-developed.  HENT:     Head: Normocephalic and atraumatic.  Eyes:     Conjunctiva/sclera: Conjunctivae normal.  Cardiovascular:     Rate and Rhythm: Normal rate and regular rhythm.     Heart sounds: Normal heart sounds.  Pulmonary:     Effort: Pulmonary effort is normal.     Breath sounds: Normal breath sounds.  Abdominal:     General: Bowel sounds are normal.     Palpations: Abdomen is soft.  Musculoskeletal:     Cervical back: Normal  range of motion.  Skin:    General: Skin is warm and dry.     Capillary Refill: Capillary refill takes less than 2 seconds.     Comments: Hyperpigmentation on anterior neck No malar rash   Neurological:     Mental Status: She is alert and oriented to person, place, and time.  Psychiatric:        Behavior: Behavior normal.      Musculoskeletal Exam: C-spine, thoracic spine, and lumbar spine good ROM.  Shoulder joints, elbow joints, wrist joints, MCPs, PIPs, and DIPs good ROM with no synovitis. Tenderness over MCPs but no synovitis.  Complete fist formation bilaterally.  Hip joints, knee joints, ankle joints have good ROM with no discomfort.  No warmth or effusion of knee joints.  No tenderness or swelling of ankle joints.    CDAI Exam: CDAI Score: -- Patient Global: --; Provider Global: -- Swollen: --; Tender: -- Joint Exam 07/14/2022   No joint exam has been documented for this visit   There is currently no information documented on the homunculus. Go to the Rheumatology activity and complete the homunculus joint  exam.  Investigation: No additional findings.  Imaging: No results found.  Recent Labs: Lab Results  Component Value Date   WBC 7.9 07/07/2022   HGB 12.4 07/07/2022   PLT 376.0 07/07/2022   NA 139 07/07/2022   K 3.5 07/07/2022   CL 97 07/07/2022   CO2 33 (H) 07/07/2022   GLUCOSE 89 07/07/2022   BUN 19 07/07/2022   CREATININE 0.95 07/07/2022   BILITOT 0.3 07/07/2022   ALKPHOS 91 07/07/2022   AST 14 07/07/2022   ALT 13 07/07/2022   PROT 8.3 07/07/2022   ALBUMIN 4.0 07/07/2022   CALCIUM 10.3 07/07/2022   GFRAA 74 12/16/2020   QFTBGOLDPLUS NEGATIVE 07/03/2019    Speciality Comments: PLQ Eye Exam: 04/02/2022 WNL  @ [facility] Follow up 1 year   Procedures:  No procedures performed Allergies: Penicillins     Assessment / Plan:     Visit Diagnoses: Autoimmune disease ([place]) - AVISE+ANA, +APS IgM positive: She is clinically doing well taking plaquenil 200 mg 1 tablet by mouth twice daily.  She continues to have chronic sicca symptoms so discussed the use of over-the-counter products including refresh, Systane, XyliMelts, and Biotene products.  She continues to see the dentist every 6 months and her pathologist on a yearly basis.  She has occasional oral ulcerations but none currently.  No Maller rash was noted on examination today.  She has not had any shortness of breath or pleuritic chest pain.  She continues to experience pain and stiffness involving multiple joints.  On examination today no synovitis was noted.  Discussed that if she continues to experience persistent pain in her hands we can schedule an ultrasound to assess for synovitis. Her biggest concerns have been brain fog and fatigue.  Discussed the importance of regular exercise, good sleep hygiene, and stress management. She is going on an upcoming cruise in October so I discussed the importance of avoiding direct sun exposure.  She should wear sunscreen SPF 50 on a daily basis, widebrimmed hat, and sun  protective clothing. Autoimmune lab work from 02/10/2022 was reviewed today in the office: ESR 48-stable, C4 59-elevated, double-stranded DNA negative.  The following lab work will be updated today.  She will remain on Plaquenil as prescribed.  A refill of Plaquenil will be sent to the pharmacy today.  She was advised to notify us if  she develops signs or symptoms of a flare.  She will follow-up in the office in 5 months or sooner if needed. - Plan: Anti-DNA antibody, double-stranded, C3 and C4, Sedimentation rate, VITAMIN D 25 Hydroxy (Vit-D Deficiency, Fractures), ANA, hydroxychloroquine (PLAQUENIL) 200 MG tablet  High risk medication use - Plaquenil 200 mg 1 tablet by mouth twice daily. She is tolerating plaquenil without any side effects.  PLQ Eye Exam: 04/02/2022 WNL @ [facility] Follow up 1 year.  CBC, BMP, and hepatic function panel updated on 07/07/2022.  Hair loss: No signs of alopecia.   Family history of blood clots  Other fatigue: She has ongoing fatigue and brain fog.  Discussed the importance of regular exercise, good sleep hygiene, and stress management.   Chronic SI joint pain: Intermittent discomfort.   DDD (degenerative disc disease), lumbar: She continues to experience intermittent discomfort in her lower back.  No symptoms of radiculopathy.   Facet arthropathy  Vitamin D deficiency -Vitamin D level will be checked today.  Plan: VITAMIN D 25 Hydroxy (Vit-D Deficiency, Fractures)  Other medical conditions are listed as follows:   Diabetic polyneuropathy associated with diabetes mellitus due to underlying condition ([place])  Essential hypertension  RLS (restless legs syndrome)  Moderate persistent reactive airway disease with acute exacerbation  Former smoker  Depression with anxiety  Gastroesophageal reflux disease without esophagitis  ADHD (attention deficit hyperactivity disorder), inattentive type  Orders: Orders Placed This Encounter  Procedures    Anti-DNA antibody, double-stranded   C3 and C4   Sedimentation rate   VITAMIN D 25 Hydroxy (Vit-D Deficiency, Fractures)   ANA   Meds ordered this encounter  Medications   hydroxychloroquine (PLAQUENIL) 200 MG tablet    Sig: Take 1 tablet (200 mg total) by mouth 2 (two) times daily.    Dispense:  180 tablet    Refill:  0     Follow-Up Instructions: Return in about 5 months (around 12/14/2022) for Autoimmune Disease, DDD.   Ofilia Neas, PA-C  Note - This record has been created using Dragon software.  Chart creation errors have been sought, but may not always  have been located. Such creation errors do not reflect on  the standard of medical care.

## 2022-07-01 ENCOUNTER — Other Ambulatory Visit: Payer: Self-pay | Admitting: Family Medicine

## 2022-07-01 ENCOUNTER — Other Ambulatory Visit (HOSPITAL_COMMUNITY): Payer: Self-pay

## 2022-07-01 ENCOUNTER — Encounter: Payer: Self-pay | Admitting: Family Medicine

## 2022-07-01 ENCOUNTER — Other Ambulatory Visit: Payer: Self-pay | Admitting: Family

## 2022-07-01 MED ORDER — METFORMIN HCL ER 500 MG PO TB24
ORAL_TABLET | Freq: Two times a day (BID) | ORAL | 1 refills | Status: DC
  Filled 2022-07-01: qty 180, 90d supply, fill #0
  Filled 2023-03-05: qty 180, 90d supply, fill #1

## 2022-07-02 ENCOUNTER — Other Ambulatory Visit (HOSPITAL_COMMUNITY): Payer: Self-pay

## 2022-07-02 ENCOUNTER — Other Ambulatory Visit: Payer: Self-pay | Admitting: Family Medicine

## 2022-07-07 ENCOUNTER — Encounter: Payer: Self-pay | Admitting: Family Medicine

## 2022-07-07 ENCOUNTER — Other Ambulatory Visit (HOSPITAL_COMMUNITY): Payer: Self-pay

## 2022-07-07 ENCOUNTER — Ambulatory Visit: Payer: 59 | Admitting: Family Medicine

## 2022-07-07 VITALS — BP 128/80 | HR 88 | Temp 98.2°F | Resp 17 | Ht 65.0 in | Wt 333.2 lb

## 2022-07-07 DIAGNOSIS — E785 Hyperlipidemia, unspecified: Secondary | ICD-10-CM | POA: Diagnosis not present

## 2022-07-07 DIAGNOSIS — R21 Rash and other nonspecific skin eruption: Secondary | ICD-10-CM | POA: Diagnosis not present

## 2022-07-07 DIAGNOSIS — E119 Type 2 diabetes mellitus without complications: Secondary | ICD-10-CM | POA: Diagnosis not present

## 2022-07-07 DIAGNOSIS — E0842 Diabetes mellitus due to underlying condition with diabetic polyneuropathy: Secondary | ICD-10-CM | POA: Diagnosis not present

## 2022-07-07 DIAGNOSIS — Z6841 Body Mass Index (BMI) 40.0 and over, adult: Secondary | ICD-10-CM

## 2022-07-07 DIAGNOSIS — I10 Essential (primary) hypertension: Secondary | ICD-10-CM | POA: Diagnosis not present

## 2022-07-07 DIAGNOSIS — E1169 Type 2 diabetes mellitus with other specified complication: Secondary | ICD-10-CM

## 2022-07-07 DIAGNOSIS — Z23 Encounter for immunization: Secondary | ICD-10-CM | POA: Diagnosis not present

## 2022-07-07 LAB — MICROALBUMIN / CREATININE URINE RATIO
Creatinine,U: 168.7 mg/dL
Microalb Creat Ratio: 0.5 mg/g (ref 0.0–30.0)
Microalb, Ur: 0.9 mg/dL (ref 0.0–1.9)

## 2022-07-07 LAB — HEPATIC FUNCTION PANEL
ALT: 13 U/L (ref 0–35)
AST: 14 U/L (ref 0–37)
Albumin: 4 g/dL (ref 3.5–5.2)
Alkaline Phosphatase: 91 U/L (ref 39–117)
Bilirubin, Direct: 0.1 mg/dL (ref 0.0–0.3)
Total Bilirubin: 0.3 mg/dL (ref 0.2–1.2)
Total Protein: 8.3 g/dL (ref 6.0–8.3)

## 2022-07-07 LAB — BASIC METABOLIC PANEL
BUN: 19 mg/dL (ref 6–23)
CO2: 33 mEq/L — ABNORMAL HIGH (ref 19–32)
Calcium: 10.3 mg/dL (ref 8.4–10.5)
Chloride: 97 mEq/L (ref 96–112)
Creatinine, Ser: 0.95 mg/dL (ref 0.40–1.20)
GFR: 70.25 mL/min (ref >60.00)
Glucose, Bld: 89 mg/dL (ref 70–99)
Potassium: 3.5 mEq/L (ref 3.5–5.1)
Sodium: 139 mEq/L (ref 135–145)

## 2022-07-07 LAB — HEMOGLOBIN A1C: Hgb A1c MFr Bld: 7.2 % — ABNORMAL HIGH (ref 4.6–6.5)

## 2022-07-07 LAB — CBC WITH DIFFERENTIAL/PLATELET
Basophils Absolute: 0 10*3/uL (ref 0.0–0.1)
Basophils Relative: 0.3 % (ref 0.0–3.0)
Eosinophils Absolute: 0.1 10*3/uL (ref 0.0–0.7)
Eosinophils Relative: 1.5 % (ref 0.0–5.0)
HCT: 38.5 % (ref 36.0–46.0)
Hemoglobin: 12.4 g/dL (ref 12.0–15.0)
Lymphocytes Relative: 37.6 % (ref 12.0–46.0)
Lymphs Abs: 3 10*3/uL (ref 0.7–4.0)
MCHC: 32.3 g/dL (ref 30.0–36.0)
MCV: 83.7 fl (ref 78.0–100.0)
Monocytes Absolute: 0.7 10*3/uL (ref 0.1–1.0)
Monocytes Relative: 8.3 % (ref 3.0–12.0)
Neutro Abs: 4.2 10*3/uL (ref 1.4–7.7)
Neutrophils Relative %: 52.3 % (ref 43.0–77.0)
Platelets: 376 10*3/uL (ref 150.0–400.0)
RBC: 4.59 Mil/uL (ref 3.87–5.11)
RDW: 16 % — ABNORMAL HIGH (ref 11.5–15.5)
WBC: 7.9 10*3/uL (ref 4.0–10.5)

## 2022-07-07 LAB — LIPID PANEL
Cholesterol: 150 mg/dL (ref 0–200)
HDL: 56.7 mg/dL (ref >39.00)
LDL Cholesterol: 77 mg/dL (ref 0–99)
NonHDL: 92.93
Total CHOL/HDL Ratio: 3
Triglycerides: 78 mg/dL (ref 0.0–149.0)
VLDL: 15.6 mg/dL (ref 0.0–40.0)

## 2022-07-07 LAB — TSH: TSH: 2.22 u[IU]/mL (ref 0.35–5.50)

## 2022-07-07 MED ORDER — SULFACETAMIDE SODIUM-SULFUR 10-5 % EX LIQD
CUTANEOUS | 3 refills | Status: DC
  Filled 2022-07-07: qty 170, fill #0
  Filled 2022-11-10: qty 170, 30d supply, fill #0
  Filled 2023-01-25: qty 170, 30d supply, fill #1
  Filled 2023-04-26: qty 170, 30d supply, fill #2

## 2022-07-07 MED ORDER — SCOPOLAMINE 1 MG/3DAYS TD PT72
1.0000 | MEDICATED_PATCH | TRANSDERMAL | 0 refills | Status: DC
  Filled 2022-07-07: qty 4, 12d supply, fill #0

## 2022-07-07 MED ORDER — CLOTRIMAZOLE-BETAMETHASONE 1-0.05 % EX CREA
1.0000 | TOPICAL_CREAM | Freq: Every day | CUTANEOUS | 0 refills | Status: DC
  Filled 2022-07-07: qty 45, 30d supply, fill #0

## 2022-07-07 MED ORDER — DESONIDE 0.05 % EX CREA
TOPICAL_CREAM | Freq: Two times a day (BID) | CUTANEOUS | 3 refills | Status: DC
  Filled 2022-07-07: qty 30, 10d supply, fill #0
  Filled 2022-09-21: qty 30, 10d supply, fill #1
  Filled 2023-01-25: qty 30, 10d supply, fill #2
  Filled 2023-04-26: qty 30, 10d supply, fill #3

## 2022-07-07 MED ORDER — ALPRAZOLAM 0.5 MG PO TABS
0.5000 mg | ORAL_TABLET | Freq: Two times a day (BID) | ORAL | 1 refills | Status: DC | PRN
Start: 2022-07-07 — End: 2023-02-24
  Filled 2022-07-07: qty 30, 15d supply, fill #0

## 2022-07-07 MED ORDER — HYDROCHLOROTHIAZIDE 25 MG PO TABS
ORAL_TABLET | Freq: Every day | ORAL | 0 refills | Status: DC
Start: 2022-07-07 — End: 2022-10-02
  Filled 2022-07-07: qty 90, 90d supply, fill #0

## 2022-07-07 MED ORDER — ESCITALOPRAM OXALATE 20 MG PO TABS
20.0000 mg | ORAL_TABLET | Freq: Every day | ORAL | 1 refills | Status: DC
  Filled 2022-07-07 – 2022-11-16 (×2): qty 90, 90d supply, fill #0
  Filled 2023-04-26: qty 90, 90d supply, fill #1

## 2022-07-07 NOTE — Assessment & Plan Note (Signed)
Chronic problem.  On Metformin XR '500mg'$  BID w/ hx of good control.  Last A1C 6.6%.  UTD on eye exam, foot exam.  microalbumin ordered today.  Check labs.  Adjust meds prn

## 2022-07-07 NOTE — Assessment & Plan Note (Signed)
Chronic problem.  Currently well controlled on Atenolol '100mg'$  QHS, Lasix '20mg'$  daily, HCTZ '25mg'$  daily, Losartan '100mg'$  daily w/ good control.  Asymptomatic.  Check labs due to ARB and diuretic use.  No anticipated med changes.

## 2022-07-07 NOTE — Assessment & Plan Note (Signed)
Ongoing issue for pt.  She has gained 5 lbs.  Went to her first exercise class this morning.  Trying to limit her intake of comfort foods.  Applauded her efforts.  Will continue to follow.

## 2022-07-07 NOTE — Assessment & Plan Note (Signed)
Pt's LDL 81.  Goal w/ DM is <70.  Trying to avoid a statin.  Check labs.  Start meds prn

## 2022-07-07 NOTE — Progress Notes (Signed)
   Subjective:    Patient ID: Vanessa Reeves, female    DOB: 09/15/1971, 51 y.o.   MRN: [id number]  HPI DM- chronic problem, on Metformin XR '500mg'$  BID.  Last A1C 6.6%.  UTD on eye exam, foot exam.  Will get microalbumin today.  + fatigue.  Went to first exercise class today.  Denies symptomatic lows.  + neuropathy.  HTN- chronic problem, on Atenolol '100mg'$  QHS, Lasix '20mg'$  daily, HCTZ '25mg'$  daily, Losartan '100mg'$  daily w/ good control.  No CP, SOB, visual changes, edema.  + mild stress headaches.  Hyperlipidemia-chronic problem, last LDL 81.  Attempting to control w/o medication.  Denies abd pain, N/V.  Rash under chin on neck- pt reports she has had this for weeks.  Has been applying vasoline but notes that the area burns.  Hyperpigmented.   Review of Systems For ROS see HPI     Objective:   Physical Exam Vitals reviewed.  Constitutional:      General: She is not in acute distress.    Appearance: Normal appearance. She is well-developed. She is obese. She is not ill-appearing.  HENT:     Head: Normocephalic and atraumatic.  Eyes:     Conjunctiva/sclera: Conjunctivae normal.     Pupils: Pupils are equal, round, and reactive to light.  Neck:     Thyroid: No thyromegaly.  Cardiovascular:     Rate and Rhythm: Normal rate and regular rhythm.     Pulses: Normal pulses.     Heart sounds: Normal heart sounds. No murmur heard. Pulmonary:     Effort: Pulmonary effort is normal. No respiratory distress.     Breath sounds: Normal breath sounds.  Abdominal:     General: There is no distension.     Palpations: Abdomen is soft.     Tenderness: There is no abdominal tenderness.  Musculoskeletal:     Cervical back: Normal range of motion and neck supple.     Right lower leg: No edema.     Left lower leg: No edema.  Lymphadenopathy:     Cervical: No cervical adenopathy.  Skin:    General: Skin is warm and dry.     Findings: Rash (hyperpigmented yeast dermatitis under chin) present.   Neurological:     General: No focal deficit present.     Mental Status: She is alert and oriented to person, place, and time.  Psychiatric:        Mood and Affect: Mood normal.        Behavior: Behavior normal.           Assessment & Plan:   Rash- new.  Consistent w/ fungal dermatitis.  Start Lotrisone BID.  Pt expressed understanding and is in agreement w/ plan.

## 2022-07-07 NOTE — Patient Instructions (Addendum)
Follow up in 3-4 months to recheck sugar We'll notify you of your lab results and make any changes if needed Continue to work on healthy diet and regular exercise- I'm SO proud of you for going to exercise class!!! I love that you are taking time for you!!! Apply the Lotrisone cream in a thin layer twice daily to help w/ rash Wear the Scopolamine patch while on your cruise- just be sure to wash your hands after applying Call with any questions or concerns ENJOY THE CRUISE!!!

## 2022-07-08 NOTE — Progress Notes (Signed)
Pt seen results Via my chart  

## 2022-07-10 ENCOUNTER — Other Ambulatory Visit (HOSPITAL_COMMUNITY): Payer: Self-pay

## 2022-07-10 MED ORDER — CLINDAMYCIN HCL 300 MG PO CAPS
ORAL_CAPSULE | ORAL | 0 refills | Status: DC
  Filled 2022-07-10: qty 29, 7d supply, fill #0

## 2022-07-14 ENCOUNTER — Encounter: Payer: Self-pay | Admitting: Physician Assistant

## 2022-07-14 ENCOUNTER — Other Ambulatory Visit (HOSPITAL_COMMUNITY): Payer: Self-pay

## 2022-07-14 ENCOUNTER — Ambulatory Visit: Payer: 59 | Attending: Physician Assistant | Admitting: Physician Assistant

## 2022-07-14 VITALS — BP 170/92 | HR 67 | Resp 16 | Ht 65.0 in | Wt 339.8 lb

## 2022-07-14 DIAGNOSIS — F900 Attention-deficit hyperactivity disorder, predominantly inattentive type: Secondary | ICD-10-CM

## 2022-07-14 DIAGNOSIS — Z8249 Family history of ischemic heart disease and other diseases of the circulatory system: Secondary | ICD-10-CM

## 2022-07-14 DIAGNOSIS — M533 Sacrococcygeal disorders, not elsewhere classified: Secondary | ICD-10-CM

## 2022-07-14 DIAGNOSIS — L659 Nonscarring hair loss, unspecified: Secondary | ICD-10-CM

## 2022-07-14 DIAGNOSIS — G2581 Restless legs syndrome: Secondary | ICD-10-CM

## 2022-07-14 DIAGNOSIS — Z87891 Personal history of nicotine dependence: Secondary | ICD-10-CM

## 2022-07-14 DIAGNOSIS — M47819 Spondylosis without myelopathy or radiculopathy, site unspecified: Secondary | ICD-10-CM | POA: Diagnosis not present

## 2022-07-14 DIAGNOSIS — K219 Gastro-esophageal reflux disease without esophagitis: Secondary | ICD-10-CM

## 2022-07-14 DIAGNOSIS — M5136 Other intervertebral disc degeneration, lumbar region: Secondary | ICD-10-CM | POA: Diagnosis not present

## 2022-07-14 DIAGNOSIS — J4541 Moderate persistent asthma with (acute) exacerbation: Secondary | ICD-10-CM

## 2022-07-14 DIAGNOSIS — I10 Essential (primary) hypertension: Secondary | ICD-10-CM

## 2022-07-14 DIAGNOSIS — M51369 Other intervertebral disc degeneration, lumbar region without mention of lumbar back pain or lower extremity pain: Secondary | ICD-10-CM

## 2022-07-14 DIAGNOSIS — Z79899 Other long term (current) drug therapy: Secondary | ICD-10-CM

## 2022-07-14 DIAGNOSIS — E0842 Diabetes mellitus due to underlying condition with diabetic polyneuropathy: Secondary | ICD-10-CM

## 2022-07-14 DIAGNOSIS — G8929 Other chronic pain: Secondary | ICD-10-CM

## 2022-07-14 DIAGNOSIS — R5383 Other fatigue: Secondary | ICD-10-CM

## 2022-07-14 DIAGNOSIS — E559 Vitamin D deficiency, unspecified: Secondary | ICD-10-CM | POA: Diagnosis not present

## 2022-07-14 DIAGNOSIS — M359 Systemic involvement of connective tissue, unspecified: Secondary | ICD-10-CM

## 2022-07-14 DIAGNOSIS — F418 Other specified anxiety disorders: Secondary | ICD-10-CM

## 2022-07-14 MED ORDER — HYDROXYCHLOROQUINE SULFATE 200 MG PO TABS
200.0000 mg | ORAL_TABLET | Freq: Two times a day (BID) | ORAL | 0 refills | Status: DC
  Filled 2022-07-14: qty 180, 90d supply, fill #0

## 2022-07-15 ENCOUNTER — Encounter: Payer: Self-pay | Admitting: Rheumatology

## 2022-07-15 NOTE — Progress Notes (Signed)
CBC was WNL on 07/07/22.   ESR remains elevated but stable.  Vitamin D WNL.  Complements are elevated.   Not a sign of active autoimmune disease currently.

## 2022-07-16 LAB — ANA: Anti Nuclear Antibody (ANA): POSITIVE — AB

## 2022-07-16 LAB — ANTI-DNA ANTIBODY, DOUBLE-STRANDED: ds DNA Ab: 1 IU/mL

## 2022-07-16 LAB — ANTI-NUCLEAR AB-TITER (ANA TITER): ANA Titer 1: 1:160 {titer} — ABNORMAL HIGH

## 2022-07-16 LAB — C3 AND C4
C3 Complement: 207 mg/dL — ABNORMAL HIGH (ref 83–193)
C4 Complement: 66 mg/dL — ABNORMAL HIGH (ref 15–57)

## 2022-07-16 LAB — VITAMIN D 25 HYDROXY (VIT D DEFICIENCY, FRACTURES): Vit D, 25-Hydroxy: 36 ng/mL (ref 30–100)

## 2022-07-16 LAB — SEDIMENTATION RATE: Sed Rate: 58 mm/h — ABNORMAL HIGH (ref 0–20)

## 2022-07-16 NOTE — Progress Notes (Signed)
dsDNA is negative

## 2022-07-17 NOTE — Progress Notes (Signed)
ANA remains positive-titer unchanged.

## 2022-07-23 ENCOUNTER — Other Ambulatory Visit (HOSPITAL_COMMUNITY): Payer: Self-pay

## 2022-07-26 ENCOUNTER — Encounter: Payer: Self-pay | Admitting: Rheumatology

## 2022-07-30 ENCOUNTER — Other Ambulatory Visit (HOSPITAL_COMMUNITY): Payer: Self-pay

## 2022-07-30 MED ORDER — DIAZEPAM 5 MG PO TABS
10.0000 mg | ORAL_TABLET | ORAL | 0 refills | Status: DC
  Filled 2022-07-30: qty 2, 1d supply, fill #0

## 2022-08-03 ENCOUNTER — Other Ambulatory Visit (HOSPITAL_COMMUNITY): Payer: Self-pay

## 2022-08-03 ENCOUNTER — Encounter: Payer: Self-pay | Admitting: Rheumatology

## 2022-08-03 ENCOUNTER — Other Ambulatory Visit: Payer: Self-pay | Admitting: Specialist

## 2022-08-03 DIAGNOSIS — M5136 Other intervertebral disc degeneration, lumbar region: Secondary | ICD-10-CM

## 2022-08-03 MED ORDER — TRAMADOL-ACETAMINOPHEN 37.5-325 MG PO TABS
1.0000 | ORAL_TABLET | Freq: Four times a day (QID) | ORAL | 0 refills | Status: DC | PRN
  Filled 2022-08-03: qty 30, 8d supply, fill #0

## 2022-08-03 MED ORDER — CYCLOBENZAPRINE HCL 10 MG PO TABS
ORAL_TABLET | Freq: Every day | ORAL | 0 refills | Status: DC
  Filled 2022-08-03: qty 30, 30d supply, fill #0

## 2022-08-03 MED ORDER — GABAPENTIN 300 MG PO CAPS
ORAL_CAPSULE | Freq: Every day | ORAL | 3 refills | Status: DC
  Filled 2022-08-03: qty 30, 30d supply, fill #0
  Filled 2023-01-25: qty 30, 30d supply, fill #1

## 2022-08-03 NOTE — Telephone Encounter (Signed)
Reviewed lab work on 07/07/22. Renal function WNL.   No interaction between plaquenil and ibuprofen.   Patient should clarify with her dentist/oral surgeon if they are ok with her taking ibuprofen.

## 2022-08-04 ENCOUNTER — Other Ambulatory Visit (HOSPITAL_COMMUNITY): Payer: Self-pay

## 2022-08-04 MED ORDER — HYDROCODONE-ACETAMINOPHEN 5-325 MG PO TABS
1.0000 | ORAL_TABLET | ORAL | 0 refills | Status: DC | PRN
  Filled 2022-08-04: qty 12, 2d supply, fill #0

## 2022-08-17 NOTE — Telephone Encounter (Signed)
I do not know any particular side effects of elderberry although she may discuss with the pharmacist.

## 2022-08-26 ENCOUNTER — Other Ambulatory Visit: Payer: Self-pay | Admitting: Family Medicine

## 2022-08-26 ENCOUNTER — Other Ambulatory Visit (HOSPITAL_COMMUNITY): Payer: Self-pay

## 2022-08-26 MED ORDER — ATENOLOL 50 MG PO TABS
100.0000 mg | ORAL_TABLET | Freq: Every day | ORAL | 3 refills | Status: DC
  Filled 2022-08-26: qty 180, fill #0
  Filled 2023-02-24: qty 180, 90d supply, fill #0
  Filled 2023-07-28: qty 180, 90d supply, fill #1

## 2022-08-26 MED ORDER — LOSARTAN POTASSIUM 50 MG PO TABS
100.0000 mg | ORAL_TABLET | Freq: Every day | ORAL | 1 refills | Status: DC
  Filled 2022-08-26: qty 60, 30d supply, fill #0
  Filled 2022-11-03: qty 60, 30d supply, fill #1

## 2022-08-27 ENCOUNTER — Other Ambulatory Visit (HOSPITAL_COMMUNITY): Payer: Self-pay

## 2022-09-21 ENCOUNTER — Other Ambulatory Visit (HOSPITAL_COMMUNITY): Payer: Self-pay

## 2022-09-30 ENCOUNTER — Other Ambulatory Visit: Payer: Self-pay | Admitting: Family Medicine

## 2022-09-30 DIAGNOSIS — E0842 Diabetes mellitus due to underlying condition with diabetic polyneuropathy: Secondary | ICD-10-CM

## 2022-10-01 ENCOUNTER — Other Ambulatory Visit (HOSPITAL_COMMUNITY): Payer: Self-pay

## 2022-10-01 MED ORDER — FREESTYLE LITE TEST VI STRP
ORAL_STRIP | 12 refills | Status: DC
  Filled 2022-10-01: qty 100, 50d supply, fill #0
  Filled 2023-01-25: qty 100, 50d supply, fill #1

## 2022-10-01 MED ORDER — FREESTYLE LANCETS MISC
12 refills | Status: DC
  Filled 2022-10-01: qty 100, 50d supply, fill #0
  Filled 2023-01-25: qty 100, 50d supply, fill #1

## 2022-10-02 ENCOUNTER — Other Ambulatory Visit (HOSPITAL_COMMUNITY): Payer: Self-pay

## 2022-10-02 ENCOUNTER — Other Ambulatory Visit: Payer: Self-pay | Admitting: Family Medicine

## 2022-10-02 MED ORDER — HYDROCHLOROTHIAZIDE 25 MG PO TABS
25.0000 mg | ORAL_TABLET | Freq: Every day | ORAL | 0 refills | Status: DC
  Filled 2022-10-02: qty 90, 90d supply, fill #0

## 2022-10-06 ENCOUNTER — Telehealth: Payer: 59 | Admitting: Family Medicine

## 2022-10-06 ENCOUNTER — Encounter: Payer: Self-pay | Admitting: Family Medicine

## 2022-10-06 ENCOUNTER — Other Ambulatory Visit (HOSPITAL_COMMUNITY): Payer: Self-pay

## 2022-10-06 VITALS — Temp 99.9°F

## 2022-10-06 DIAGNOSIS — J111 Influenza due to unidentified influenza virus with other respiratory manifestations: Secondary | ICD-10-CM

## 2022-10-06 MED ORDER — FLUTICASONE-SALMETEROL 115-21 MCG/ACT IN AERO
2.0000 | INHALATION_SPRAY | Freq: Two times a day (BID) | RESPIRATORY_TRACT | 12 refills | Status: DC
  Filled 2022-10-06: qty 12, 30d supply, fill #0

## 2022-10-06 NOTE — Progress Notes (Signed)
Virtual Visit via Video   I connected with patient on 10/06/22 at 11:20 AM EST by a video enabled telemedicine application and verified that I am speaking with the correct person using two identifiers.  Location patient: Home Location provider: [facility], Office Persons participating in the virtual visit: Patient, Provider, [place] Marcille Blanco C)  I discussed the limitations of evaluation and management by telemedicine and the availability of in person appointments. The patient expressed understanding and agreed to proceed.  Subjective:   HPI:   Cough- sxs started Saturday w/ sore throat.  Sunday temp was 100.7  She has low grade fever, cough, headache, congestion, body aches, and chills.  Current temp 99.9.  Pt reports skin sensitivity- uncomfortable to shower.  Decreased appetite.  Drinking fluids.  Home COVID test was negative.    ROS:   See pertinent positives and negatives per HPI.  Patient Active Problem List   Diagnosis Date Noted   Essential hypertension 07/17/2013    Priority: Medium    Polyp of transverse colon    Polyp of sigmoid colon    Polyp of rectum    Adjustment disorder with depressed mood 10/24/2020   Hearing loss of right ear 09/04/2020   Hyperlipidemia associated with type 2 diabetes mellitus ([place]) 02/21/2020   Insomnia 02/21/2020   Vitamin D deficiency 02/21/2020   Degenerative disc disease, lumbar 02/21/2020   Diabetic polyneuropathy associated with diabetes mellitus due to underlying condition ([place]) 06/14/2019   Special screening for malignant neoplasms, colon 04/11/2018   Memory changes 11/23/2017   RLS (restless legs syndrome) 03/17/2017   ADHD (attention deficit hyperactivity disorder), 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   Reactive airway disease 08/21/2016   Gastroesophageal reflux disease without esophagitis 12/20/2015   Physical exam 12/14/2014   Breast mass, left 02/16/2014   Depression with anxiety 07/17/2013   Eustachian tube dysfunction  07/17/2013   Adult BMI 50.0-59.9 kg/sq m ([place]) 07/17/2013    Social History   Tobacco Use   Smoking status: Some Days    Types: Cigarettes    Passive exposure: Never   Smokeless tobacco: Never   Tobacco comments:    Smokes 2-3 times monthly   Substance Use Topics   Alcohol use: Yes    Comment: occ    Current Outpatient Medications:    albuterol (VENTOLIN HFA) 108 (90 Base) MCG/ACT inhaler, Inhale 2 puffs into the lungs every 6  hours as needed for wheezing or shortness of breath., Disp: 18 g, Rfl: 3   ALPRAZolam (XANAX) 0.5 MG tablet, Take 1 tablet by mouth 2 (two) times daily as needed for anxiety., Disp: 30 tablet, Rfl: 1   atenolol (TENORMIN) 50 MG tablet, Take 2 tablets (100 mg total) by mouth at bedtime., Disp: 180 tablet, Rfl: 3   Blood Glucose Monitoring Suppl (FREESTYLE LITE) w/Device KIT, Use as directed to test blood glucose once to twice daily., Disp: 1 kit, Rfl: 2   cetirizine (ZYRTEC) 10 MG tablet, Take 10 mg by mouth daily., Disp: , Rfl:    clotrimazole-betamethasone (LOTRISONE) cream, Apply topically once a day as directed., Disp: 45 g, Rfl: 0   cyclobenzaprine (FLEXERIL) 10 MG tablet, TAKE 1 TABLET BY MOUTH EVERY NIGHT AT BEDTIME, Disp: 30 tablet, Rfl: 0   desonide (DESOWEN) 0.05 % cream, Apply to affected area 2 (two) times daily., Disp: 30 g, Rfl: 3   escitalopram (LEXAPRO) 20 MG tablet, Take 1 tablet (20 mg total) by mouth daily., Disp: 90 tablet, Rfl: 1   fluticasone-salmeterol (ADVAIR HFA) 115-21  MCG/ACT inhaler, Inhale 2 puffs into the lungs 2 (two) times daily., Disp: 12 g, Rfl: 12   furosemide (LASIX) 20 MG tablet, TAKE 1 TABLET BY MOUTH ONCE A DAY, Disp: 90 tablet, Rfl: 1   gabapentin (NEURONTIN) 300 MG capsule, TAKE 1 CAPSULE BY MOUTH EVERY NIGHT AT BEDTIME, Disp: 30 capsule, Rfl: 3   glucose blood (FREESTYLE LITE) test strip, Use as directed to test blood glucose once to twice daily., Disp: 100 each, Rfl: 12   hydrochlorothiazide (HYDRODIURIL) 25 MG tablet,  Take 1 tablet (25 mg total) by mouth daily., Disp: 90 tablet, Rfl: 0   hydroxychloroquine (PLAQUENIL) 200 MG tablet, Take 1 tablet (200 mg total) by mouth 2 (two) times daily., Disp: 180 tablet, Rfl: 0   Lancets (FREESTYLE) lancets, Use to test blood glucose once or twice daily., Disp: 100 each, Rfl: 12   losartan (COZAAR) 50 MG tablet, Take 2 tablets (100 mg total) by mouth daily., Disp: 60 tablet, Rfl: 1   metFORMIN (GLUCOPHAGE-XR) 500 MG 24 hr tablet, TAKE 1 TABLET BY MOUTH TWO TIMES DAILY WITH FOOD, Disp: 180 tablet, Rfl: 1   Omega-3 Fatty Acids (FISH OIL) 1000 MG CAPS, Take 1,000 mg by mouth daily., Disp: , Rfl:    ondansetron (ZOFRAN) 4 MG tablet, TAKE 1 TABLET BY MOUTH EVERY 6 HOURS, Disp: 60 tablet, Rfl: 0   traMADol-acetaminophen (ULTRACET) 37.5-325 MG tablet, TAKE 1 TABLET BY MOUTH EVERY 6 HOURS AS NEEDED, Disp: 30 tablet, Rfl: 0   fluticasone (FLONASE) 50 MCG/ACT nasal spray, USE 2 SPRAYS IN EACH NOSTRIL ONCE A DAY, Disp: 16 g, Rfl: 6   pantoprazole (PROTONIX) 40 MG tablet, TAKE 1 TABLET (40 MG TOTAL) BY MOUTH DAILY., Disp: 90 tablet, Rfl: 11   scopolamine (TRANSDERM-SCOP) 1 MG/3DAYS, Place 1 patch (1.5 mg total) onto the skin behind ear every 3 (three) days. (Patient not taking: Reported on 07/14/2022), Disp: 4 patch, Rfl: 0   Sulfacetamide Sodium-Sulfur 10-5 % LIQD, Use twice a day as directed. (Patient not taking: Reported on 10/06/2022), Disp: 170 g, Rfl: 3   traZODone (DESYREL) 50 MG tablet, TAKE 1/2 TO 1 TABLET BY MOUTH AT BEDTIME AS NEEDED FOR SLEEP, Disp: 30 tablet, Rfl: 3  Allergies  Allergen Reactions   Penicillins Other (See Comments)    Childhood allergy.    Objective:   Temp 99.9 F (37.7 C) (Oral)   LMP 12/04/2011  AAOx3, NAD, shivering NCAT, EOMI No obvious CN deficits Coloring WNL Pt is able to speak clearly, coherently without shortness of breath or increased work of breathing.  Thought process is linear.  Mood is appropriate.   Assessment and Plan:   ILI-  new.  Pt's sxs are consistent w/ flu and her home COVID test was negative.  Waiting on results of PCR test to confirm.  She is outside the window for Tamiflu so discussed supportive care- alternate Tylenol and ibuprofen for fever and aches, increase fluid intake, cough meds prn.  Restart Advair daily until feeling better and use Albuterol prn.  Pt expressed understanding and is in agreement w/ plan.    Annye Asa, MD 10/06/2022

## 2022-10-07 ENCOUNTER — Encounter: Payer: Self-pay | Admitting: Family Medicine

## 2022-10-07 ENCOUNTER — Other Ambulatory Visit (HOSPITAL_COMMUNITY): Payer: Self-pay

## 2022-10-08 ENCOUNTER — Other Ambulatory Visit (HOSPITAL_COMMUNITY): Payer: Self-pay

## 2022-10-08 MED ORDER — FLUTICASONE PROPIONATE HFA 110 MCG/ACT IN AERO
2.0000 | INHALATION_SPRAY | Freq: Two times a day (BID) | RESPIRATORY_TRACT | 12 refills | Status: DC
  Filled 2022-10-08 – 2023-01-25 (×2): qty 12, 30d supply, fill #0

## 2022-10-09 ENCOUNTER — Other Ambulatory Visit (HOSPITAL_COMMUNITY): Payer: Self-pay

## 2022-10-09 ENCOUNTER — Ambulatory Visit: Payer: 59 | Admitting: Family Medicine

## 2022-10-15 ENCOUNTER — Other Ambulatory Visit: Payer: Self-pay

## 2022-11-03 ENCOUNTER — Other Ambulatory Visit: Payer: Self-pay

## 2022-11-09 ENCOUNTER — Other Ambulatory Visit (HOSPITAL_COMMUNITY): Payer: Self-pay

## 2022-11-10 ENCOUNTER — Other Ambulatory Visit (HOSPITAL_COMMUNITY): Payer: Self-pay

## 2022-11-10 ENCOUNTER — Other Ambulatory Visit: Payer: Self-pay

## 2022-11-16 ENCOUNTER — Other Ambulatory Visit: Payer: Self-pay | Admitting: Rheumatology

## 2022-11-16 ENCOUNTER — Other Ambulatory Visit (HOSPITAL_COMMUNITY): Payer: Self-pay

## 2022-11-16 ENCOUNTER — Other Ambulatory Visit: Payer: Self-pay

## 2022-11-16 DIAGNOSIS — R110 Nausea: Secondary | ICD-10-CM

## 2022-11-16 MED ORDER — ONDANSETRON HCL 4 MG PO TABS
4.0000 mg | ORAL_TABLET | Freq: Three times a day (TID) | ORAL | 1 refills | Status: DC | PRN
  Filled 2022-11-16: qty 30, 10d supply, fill #0
  Filled 2023-01-11: qty 30, 10d supply, fill #1

## 2022-11-16 NOTE — Telephone Encounter (Signed)
Next Visit: 12/18/2022  Last Visit: 07/14/2022  Last Fill: 02/10/2022  Dx: Autoimmune disease   Current Dose per office note on 07/14/2022: not discussed  Okay to refill Zofran?

## 2022-11-18 ENCOUNTER — Other Ambulatory Visit (HOSPITAL_COMMUNITY): Payer: Self-pay

## 2022-12-15 NOTE — Progress Notes (Unsigned)
Office Visit Note  Patient: Vanessa Reeves             Date of Birth: [birth year]           MRN: [id number]             PCP: Midge Minium, MD Referring: Midge Minium, MD Visit Date: 12/23/2022 Occupation: @GUAROCC$ @  Subjective:  Increased joint pain   History of Present Illness: Vanessa Reeves is a 52 y.o. female with history of autoimmune disease and osteoarthritis.  She is taking Plaquenil 200 mg 1 tablet by mouth twice daily.  Patient reports that she is often missing the second dose of Plaquenil due to working night shift.  Patient reports that over the past several months has been experiencing increased joint pain, myalgias, brain fog, and fatigue.  Her pain has been most severe in both shoulders, both wrists, and both hands.  She has also had some pain in the muscles in both legs.  She has been taking tylenol as needed for pain relief.  She has been having interrupted sleep at night due to nocturnal pain.  She is also been having intermittent symptoms of Raynaud's in her fingertips.  She also has intermittent oral ulcers but denies any nasal ulcers.  She has ongoing dry mouth but no dry eyes.  She has not had any hair loss or recent Malar rashes. She states she has had increased difficulty with work due to the frequency of flares.  She plans on applying for intermittent FMLA.  Activities of Daily Living:  Patient reports morning stiffness for 20-30 minutes.   Patient Denies nocturnal pain.  Difficulty dressing/grooming: Reports Difficulty climbing stairs: Reports Difficulty getting out of chair: Denies Difficulty using hands for taps, buttons, cutlery, and/or writing: Denies  Review of Systems  Constitutional:  Positive for fatigue.  HENT:  Positive for mouth dryness. Negative for mouth sores.   Eyes:  Negative for dryness.  Respiratory:  Negative for shortness of breath.   Cardiovascular:  Positive for chest pain. Negative for palpitations.  Gastrointestinal:   Positive for constipation. Negative for blood in stool and diarrhea.  Endocrine: Negative for increased urination.  Genitourinary:  Negative for involuntary urination.  Musculoskeletal:  Positive for joint pain, gait problem, joint pain, joint swelling, myalgias, morning stiffness and myalgias. Negative for muscle weakness and muscle tenderness.  Skin:  Negative for color change, rash, hair loss and sensitivity to sunlight.  Allergic/Immunologic: Positive for susceptible to infections.  Neurological:  Positive for headaches. Negative for dizziness.  Hematological:  Negative for swollen glands.  Psychiatric/Behavioral:  Negative for depressed mood and sleep disturbance. The patient is nervous/anxious.     PMFS History:  Patient Active Problem List   Diagnosis Date Noted   Polyp of transverse colon    Polyp of sigmoid colon    Polyp of rectum    Adjustment disorder with depressed mood 10/24/2020   Hearing loss of right ear 09/04/2020   Hyperlipidemia associated with type 2 diabetes mellitus ([place]) 02/21/2020   Insomnia 02/21/2020   Vitamin D deficiency 02/21/2020   Degenerative disc disease, lumbar 02/21/2020   Diabetic polyneuropathy associated with diabetes mellitus due to underlying condition ([place]) 06/14/2019   Special screening for malignant neoplasms, colon 04/11/2018   Memory changes 11/23/2017   RLS (restless legs syndrome) 03/17/2017   ADHD (attention deficit hyperactivity disorder), 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   Reactive airway disease 08/21/2016   Gastroesophageal reflux disease without esophagitis 12/20/2015   Physical exam  12/14/2014   Breast mass, left 02/16/2014   Depression with anxiety 07/17/2013   Essential hypertension 07/17/2013   Eustachian tube dysfunction 07/17/2013   Adult BMI 50.0-59.9 kg/sq m ([place]) 07/17/2013    Past Medical History:  Diagnosis Date   Allergy    Anxiety    Arthritis    HANDS   Asthma    HAS MDI   Complication of anesthesia     Depression    Diabetes mellitus without complication ([place])    Difficult airway for intubation    2013 used Video Laryngoscope   GERD (gastroesophageal reflux disease)    Herpes    Hyperlipidemia    Hypertension    RLS (restless legs syndrome) 03/17/2017   Right leg   Special screening for malignant neoplasms, colon 04/11/2018    Family History  Problem Relation Age of Onset   Hypertension Mother    Clotting disorder Mother    Stroke Father    Thrombosis Father    Hypertension Paternal Grandmother    Diabetes Mellitus II Paternal Grandmother    Lupus Paternal Grandmother    Healthy Daughter    Healthy Son    Colon cancer Neg Hx    Esophageal cancer Neg Hx    Pancreatic cancer Neg Hx    Stomach cancer Neg Hx    Liver disease Neg Hx    Colon polyps Neg Hx    Past Surgical History:  Procedure Laterality Date   ABDOMINAL HYSTERECTOMY     COLONOSCOPY WITH PROPOFOL N/A 07/09/2021   Procedure: COLONOSCOPY WITH PROPOFOL;  Surgeon: Mauri Pole, MD;  Location: [facility];  Service: Endoscopy;  Laterality: N/A;   DILATION AND CURETTAGE OF UTERUS     POLYPECTOMY  07/09/2021   Procedure: POLYPECTOMY;  Surgeon: Mauri Pole, MD;  Location: [facility];  Service: Endoscopy;;   TUBAL LIGATION     Social History   Social History Narrative   Lives with husband and 2 children in a 2 story home.     Works as a Chartered certified accountant at [employer].     Highest level of education:  High school, in college now   Immunization History  Administered Date(s) Administered   Influenza,inj,Quad PF,6+ Mos 07/17/2013, 07/27/2014, 07/26/2015, 08/21/2016, 07/06/2017, 07/29/2018, 07/03/2019, 07/07/2022   Influenza-Unspecified 08/20/2020, 08/19/2021   MMR 03/08/2017, 04/05/2017   Moderna Sars-Covid-2 Vaccination 06/06/2020, 07/04/2020   PPD Test 07/26/2015, 03/07/2018, 05/23/2018   Pneumococcal Polysaccharide-23 01/16/2015   Tdap 10/14/2015     Objective: Vital Signs: BP (!) 165/93 (BP  Location: Left Arm, Patient Position: Sitting, Cuff Size: Large)   Pulse 71   Ht 5' 5"$  (1.651 m)   Wt (!) 344 lb (156 kg)   LMP 12/04/2011   BMI 57.24 kg/m    Physical Exam Vitals and nursing note reviewed.  Constitutional:      Appearance: She is well-developed.  HENT:     Head: Normocephalic and atraumatic.  Eyes:     Conjunctiva/sclera: Conjunctivae normal.  Cardiovascular:     Rate and Rhythm: Normal rate and regular rhythm.     Heart sounds: Normal heart sounds.  Pulmonary:     Effort: Pulmonary effort is normal.     Breath sounds: Normal breath sounds.  Abdominal:     General: Bowel sounds are normal.     Palpations: Abdomen is soft.  Musculoskeletal:     Cervical back: Normal range of motion.  Skin:    General: Skin is warm and dry.  Capillary Refill: Capillary refill takes less than 2 seconds.  Neurological:     Mental Status: She is alert and oriented to person, place, and time.  Psychiatric:        Behavior: Behavior normal.      Musculoskeletal Exam: Generalized hyperalgesia and positive tender points noted on exam.  C-spine has limited range of motion without rotation.  Trapezius muscle tension and tenderness noted bilaterally.  Tenderness over the deltoid insertion site bilaterally.  No midline spinal tenderness.  Shoulder joints have good range of motion.  Elbow joints have good range of motion.  Tenderness over the medial lateral epicondyle bilaterally.  Tenderness over both wrist joints, right greater than left.  Tenderness over MCP and PIP joints.  Hip joints have good range of motion with no groin pain.  Some tenderness over the trochanteric bursa bilaterally.  Knee joints have good range of motion with no warmth or effusion.  Ankle joints have good range of motion with no tenderness or joint swelling.  CDAI Exam: CDAI Score: -- Patient Global: --; Provider Global: -- Swollen: --; Tender: -- Joint Exam 12/23/2022   No joint exam has been documented  for this visit   There is currently no information documented on the homunculus. Go to the Rheumatology activity and complete the homunculus joint exam.  Investigation: No additional findings.  Imaging: [place] LIMITED JOINT SPACE STRUCTURES UP BILAT  Result Date: 12/23/2022 Ultrasound examination of bilateral hands was performed per EULAR recommendations. Using 15 MHz transducer, grayscale and power Doppler bilateral second and  third MCP joints  both dorsal and volar aspects were evaluated to look for synovitis or tenosynovitis. The findings were there was mild synovitis in the right second MCP joint on ultrasound examination. Impression: Ultrasound examination showed mild synovitis in the right second MCP joint.   Recent Labs: Lab Results  Component Value Date   WBC 7.9 07/07/2022   HGB 12.4 07/07/2022   PLT 376.0 07/07/2022   NA 139 07/07/2022   K 3.5 07/07/2022   CL 97 07/07/2022   CO2 33 (H) 07/07/2022   GLUCOSE 89 07/07/2022   BUN 19 07/07/2022   CREATININE 0.95 07/07/2022   BILITOT 0.3 07/07/2022   ALKPHOS 91 07/07/2022   AST 14 07/07/2022   ALT 13 07/07/2022   PROT 8.3 07/07/2022   ALBUMIN 4.0 07/07/2022   CALCIUM 10.3 07/07/2022   GFRAA 74 12/16/2020   QFTBGOLDPLUS NEGATIVE 07/03/2019    Speciality Comments: PLQ Eye Exam: 04/02/2022 WNL  @ [place] Follow up 1 year   Procedures:  No procedures performed Allergies: Penicillins    Assessment / Plan:     Visit Diagnoses: Autoimmune disease ([place]) - AVISE+ANA, +APS IgM positive: Patient presents today with new and worsening symptoms since her last office visit.  She is been experiencing increased brain fog, fatigue, myalgias, and arthralgias.  Her quality of life has been significantly affected by the symptoms.  She is having difficulty sleeping at night due to nocturnal pain.  She remains on Plaquenil 200 mg 1 tablet by mouth twice daily.  She often misses the second dose of Plaquenil due to her work schedule  and forgetting the second dose.  Discussed the use of the medicine box labeled AM and PM.  Discussed that if she misses the second dose she can take both tablets together if needed.  Discussed that the efficacy will be better if she is able to space the dose of Plaquenil if possible. On examination she has  generalized hyperalgesia and positive tender points on exam concerning for myofascial pain.  Discussed that given her history of fatigue, insomnia, and brain fog fibromyalgia is a possibility.  Discussed that it may be beneficial to switch from Lexapro to Cymbalta in the future.  She was given an informational handout about Cymbalta to review and plans on further discussing with her PCP. For further evaluation of the pain and intermittent swelling she has been experiencing in her hands and wrist joints and ultrasound was ordered today for further evaluation.  The ultrasound revealed mild synovitis in the right second MCP joint.  Dr. Estanislado Pandy suggested the use of NSAIDs PRN but does not recommend adding any immunosuppressive agents at this time.  Encouraged patient to increase compliance taking plaquenil as prescribed.   Lab work from 07/14/22 was reviewed today in the office: ANA 1:160NH, ESR 58, dsDNA negative,  complements elevated.  The following lab work will be obtained today for further evaluation.   Patient plans on applying for FMLA for flares.  She will follow up in 3 months or sooner if needed.  - Plan: COMPLETE METABOLIC PANEL WITH GFR, CBC with Differential/Platelet, Anti-DNA antibody, double-stranded, C3 and C4, Sedimentation rate, ANA, Protein / creatinine ratio, urine  High risk medication use - Plaquenil 200 mg 1 tablet by mouth twice daily. CBC, hepatic function panel, and BMP were drawn on 07/07/2022. CBC and CMP released today.    PLQ Eye Exam: 04/02/2022 WNL @ [facility] Follow up 1 year.   - Plan: COMPLETE METABOLIC PANEL WITH GFR, CBC with Differential/Platelet  Bilateral  hand pain - She presents today with increased pain and intermittent swelling in her hands and wrist joints.  She has been having nocturnal symptoms recently.  An ultrasound of both hands was obtained today which revealed mild synovitis in the right second MCP joint.  Patient was encouraged to take Plaquenil as prescribed and try to avoid missing any doses.  If her symptoms persist or worsen we can further discuss other treatment options in the future if needed.  Plan: [place] LIMITED JOINT SPACE STRUCTURES UP BILAT  Pain in both hands - The following lab work was updated today.  Ultrasound of both hands was also performed which revealed mild synovitis in the second MCP joint.  Strongly encourage patient to take Plaquenil as prescribed and to try to avoid missing any doses to increase efficacy.  Plan: Sedimentation rate, Rheumatoid factor, Cyclic citrul peptide antibody, IgG  Myofascial pain - Patient with generalized hyperalgesia and positive tender points on examination consistent with myofascial pain.  Discussed today concern for fibromyalgia.  Discussed that she may benefit from switching from Lexapro to Cymbalta in the future.  Discussed the importance of regular exercise and good sleep hygiene.   Myalgia:  She has been experiencing increased myalgias and muscle tenderness.  Her symptoms seem consistent with myofascial pain.  She has no muscular weakness at this time.  The following lab work will be obtained today for further evaluation.  Plan: TSH, CK  Other fatigue - She has been experiencing increased fatigue, brain fog, and difficulty sleeping at night.  The following lab work will be updated today. plan: TSH, CK  Family history of blood clots: Beta-2 glycoprotein antibodies, anticardiolipin antibodies, and lupus anticoagulant negative on 09/16/2021.  Chronic SI joint pain: She continues to experience intermittent discomfort in her lower back.  DDD (degenerative disc disease), lumbar: X-rays of  lumbar spine were updated on 02/25/22.    Facet arthropathy:  Lumbar X-rays updated on 02/25/22: show grade one anterolisthesis L4-5 2-3 mm no change from 07/2020. No acute changes.   Other medical conditions are listed as follows:   Vitamin D deficiency  Diabetic polyneuropathy associated with diabetes mellitus due to underlying condition ([place])  Essential hypertension: BP was 165/93 today in the office.    Moderate persistent reactive airway disease with acute exacerbation  Former smoker  RLS (restless legs syndrome)  Depression with anxiety  Gastroesophageal reflux disease without esophagitis  ADHD (attention deficit hyperactivity disorder), inattentive type      Orders: Orders Placed This Encounter  Procedures   [place] LIMITED JOINT SPACE STRUCTURES UP BILAT   COMPLETE METABOLIC PANEL WITH GFR   CBC with Differential/Platelet   Anti-DNA antibody, double-stranded   C3 and C4   Sedimentation rate   ANA   Protein / creatinine ratio, urine   Rheumatoid factor   Cyclic citrul peptide antibody, IgG   TSH   CK   No orders of the defined types were placed in this encounter.     Follow-Up Instructions: Return in about 3 months (around 03/23/2023) for Autoimmune Disease, Osteoarthritis.   Ofilia Neas, PA-C  Note - This record has been created using Dragon software.  Chart creation errors have been sought, but may not always  have been located. Such creation errors do not reflect on  the standard of medical care.

## 2022-12-18 ENCOUNTER — Ambulatory Visit: Payer: 59 | Admitting: Rheumatology

## 2022-12-21 ENCOUNTER — Other Ambulatory Visit: Payer: Commercial Managed Care - PPO

## 2022-12-21 NOTE — Progress Notes (Signed)
Patient appearing on report for [employer] [facility] - Hypertension Control report due to last documented ambulatory blood pressure of 170/92 on 07/14/22. Next appointment with PCP is 12/23/22   Outreached patient to discuss hypertension control and medication management.   HTN Current antihypertensives: Atenolol 11m daily, furosemide 296mdaily, hctz 2546maily, losartan 100m81mily -Patient has an automated upper arm home BP machine. -Current blood pressure readings: 137/84 -Patient denies hypotensive signs and symptoms including dizziness, lightheadedness.  -Patient denies hypertensive symptoms including headache, chest pain, shortness of breath. -Patient denies side effects related to current regimen  DM -Current medications:  Metformin XR 500mg79m -BG 89 & A1c 7.2 on 07/07/22 -Patient reported FBG today 94 -Denies any s/sx of hypoglycemia -Currently not on statin regimen.  LDL was 77 on 07/07/22.  Medication Management -Patient endorses forgetting doses within the last two weeks, stating remembering second doses are challenging.  States that brain fog from Lupus is increasing the difficulty of remembering.  Her husband helps remind her to take medications. -Denies any access or affordability barriers to obtaining medications.   Assessment/Plan: HTN - Currently uncontrolled - - Reviewed goal blood pressure <130/80 - Reviewed to check blood pressure daily, document, and provide at next provider visit - Reviewed strategies to improve medication adherence such as setting an alarm on her cell phone, or creating a daily reminder - Recommend addition of calcium channel blocker, such as amlodipine 5mg d65my  DM -Consider increasing metformin to 1000mg B62mnd obtaining A1c in another 3 months -Appears there has been previous discussion around addition of statin, and patient preferred to hold off.  However, based on risk vs benefit, I would recommend addition of moderate intensity statin  (atorvastatin 20mg) t9mtain LDL<50.  Also suggest Lipid panel, LFT's, CBC and CMP in 3 months.  Medication Management -Suggested utilizing an alarm on cell phone or daily reminder/app to aid in remembering medication doses -Educated to reach out if any affordability concerns come up since insurance has changed this year.  Follow-up:  None needed at this time  Vanessa Reeves ADarlina Guys, DPLA  [place]

## 2022-12-22 ENCOUNTER — Other Ambulatory Visit: Payer: Self-pay | Admitting: Family Medicine

## 2022-12-22 ENCOUNTER — Other Ambulatory Visit: Payer: Self-pay

## 2022-12-22 ENCOUNTER — Other Ambulatory Visit (HOSPITAL_COMMUNITY): Payer: Self-pay

## 2022-12-22 MED ORDER — LOSARTAN POTASSIUM 50 MG PO TABS
100.0000 mg | ORAL_TABLET | Freq: Every day | ORAL | 1 refills | Status: DC
  Filled 2022-12-22: qty 60, 30d supply, fill #0
  Filled 2023-02-05: qty 60, 30d supply, fill #1

## 2022-12-23 ENCOUNTER — Ambulatory Visit: Payer: Commercial Managed Care - PPO

## 2022-12-23 ENCOUNTER — Ambulatory Visit: Payer: Commercial Managed Care - PPO | Attending: Physician Assistant | Admitting: Physician Assistant

## 2022-12-23 ENCOUNTER — Encounter: Payer: Self-pay | Admitting: Physician Assistant

## 2022-12-23 VITALS — BP 165/93 | HR 71 | Ht 65.0 in | Wt 344.0 lb

## 2022-12-23 DIAGNOSIS — M47819 Spondylosis without myelopathy or radiculopathy, site unspecified: Secondary | ICD-10-CM

## 2022-12-23 DIAGNOSIS — Z87891 Personal history of nicotine dependence: Secondary | ICD-10-CM

## 2022-12-23 DIAGNOSIS — M79642 Pain in left hand: Secondary | ICD-10-CM

## 2022-12-23 DIAGNOSIS — M5136 Other intervertebral disc degeneration, lumbar region: Secondary | ICD-10-CM | POA: Diagnosis not present

## 2022-12-23 DIAGNOSIS — M791 Myalgia, unspecified site: Secondary | ICD-10-CM

## 2022-12-23 DIAGNOSIS — F418 Other specified anxiety disorders: Secondary | ICD-10-CM

## 2022-12-23 DIAGNOSIS — F900 Attention-deficit hyperactivity disorder, predominantly inattentive type: Secondary | ICD-10-CM

## 2022-12-23 DIAGNOSIS — I10 Essential (primary) hypertension: Secondary | ICD-10-CM

## 2022-12-23 DIAGNOSIS — E0842 Diabetes mellitus due to underlying condition with diabetic polyneuropathy: Secondary | ICD-10-CM | POA: Diagnosis not present

## 2022-12-23 DIAGNOSIS — G2581 Restless legs syndrome: Secondary | ICD-10-CM

## 2022-12-23 DIAGNOSIS — K219 Gastro-esophageal reflux disease without esophagitis: Secondary | ICD-10-CM

## 2022-12-23 DIAGNOSIS — L659 Nonscarring hair loss, unspecified: Secondary | ICD-10-CM

## 2022-12-23 DIAGNOSIS — M79641 Pain in right hand: Secondary | ICD-10-CM

## 2022-12-23 DIAGNOSIS — Z8249 Family history of ischemic heart disease and other diseases of the circulatory system: Secondary | ICD-10-CM

## 2022-12-23 DIAGNOSIS — M7918 Myalgia, other site: Secondary | ICD-10-CM | POA: Diagnosis not present

## 2022-12-23 DIAGNOSIS — R5383 Other fatigue: Secondary | ICD-10-CM | POA: Diagnosis not present

## 2022-12-23 DIAGNOSIS — M533 Sacrococcygeal disorders, not elsewhere classified: Secondary | ICD-10-CM | POA: Diagnosis not present

## 2022-12-23 DIAGNOSIS — G8929 Other chronic pain: Secondary | ICD-10-CM

## 2022-12-23 DIAGNOSIS — Z79899 Other long term (current) drug therapy: Secondary | ICD-10-CM

## 2022-12-23 DIAGNOSIS — E559 Vitamin D deficiency, unspecified: Secondary | ICD-10-CM | POA: Diagnosis not present

## 2022-12-23 DIAGNOSIS — M359 Systemic involvement of connective tissue, unspecified: Secondary | ICD-10-CM

## 2022-12-23 DIAGNOSIS — J4541 Moderate persistent asthma with (acute) exacerbation: Secondary | ICD-10-CM

## 2022-12-23 NOTE — Patient Instructions (Signed)
Duloxetine Delayed-Release Capsules What is this medication? DULOXETINE (doo LOX e teen) treats depression, anxiety, fibromyalgia, and certain types of chronic pain such as nerve, bone, or joint pain. It increases the amount of serotonin and norepinephrine in the brain, hormones that help regulate mood and pain. It belongs to a group of medications called SNRIs. This medicine may be used for other purposes; ask your health care provider or pharmacist if you have questions. COMMON BRAND NAME(S): Cymbalta, Drizalma, Irenka What should I tell my care team before I take this medication? They need to know if you have any of these conditions: Bipolar disorder Glaucoma High blood pressure Kidney disease Liver disease Seizures Suicidal thoughts, plans or attempt; a previous suicide attempt by you or a family member Take medications that treat or prevent blood clots Taken medications called MAOIs like Carbex, Eldepryl, Marplan, Nardil, and Parnate within 14 days Trouble passing urine An unusual reaction to duloxetine, other medications, foods, dyes, or preservatives Pregnant or trying to get pregnant Breast-feeding How should I use this medication? Take this medication by mouth with a glass of water. Follow the directions on the prescription label. Do not crush, cut or chew some capsules of this medication. Some capsules may be opened and sprinkled on applesauce. Check with your care team or pharmacist if you are not sure. You can take this medication with or without food. Take your medication at regular intervals. Do not take your medication more often than directed. Do not stop taking this medication suddenly except upon the advice of your care team. Stopping this medication too quickly may cause serious side effects or your condition may worsen. A special MedGuide will be given to you by the pharmacist with each prescription and refill. Be sure to read this information carefully each time. Talk to  your care team regarding the use of this medication in children. While this medication may be prescribed for children as young as 7 years of age for selected conditions, precautions do apply. Overdosage: If you think you have taken too much of this medicine contact a poison control center or emergency room at once. NOTE: This medicine is only for you. Do not share this medicine with others. What if I miss a dose? If you miss a dose, take it as soon as you can. If it is almost time for your next dose, take only that dose. Do not take double or extra doses. What may interact with this medication? Do not take this medication with any of the following: Desvenlafaxine Levomilnacipran Linezolid MAOIs like Carbex, Eldepryl, Emsam, Marplan, Nardil, and Parnate Methylene blue (injected into a vein) Milnacipran Safinamide Thioridazine Venlafaxine Viloxazine This medication may also interact with the following: Alcohol Amphetamines Aspirin and aspirin-like medications Certain antibiotics like ciprofloxacin and enoxacin Certain medications for blood pressure, heart disease, irregular heart beat Certain medications for depression, anxiety, or psychotic disturbances Certain medications for migraine headache like almotriptan, eletriptan, frovatriptan, naratriptan, rizatriptan, sumatriptan, zolmitriptan Certain medications that treat or prevent blood clots like warfarin, enoxaparin, and dalteparin Cimetidine Fentanyl Lithium NSAIDS, medications for pain and inflammation, like ibuprofen or naproxen Phentermine Procarbazine Rasagiline Sibutramine St. John's wort Theophylline Tramadol Tryptophan This list may not describe all possible interactions. Give your health care provider a list of all the medicines, herbs, non-prescription drugs, or dietary supplements you use. Also tell them if you smoke, drink alcohol, or use illegal drugs. Some items may interact with your medicine. What should I watch  for while using this medication? Tell your   care team if your symptoms do not get better or if they get worse. Visit your care team for regular checks on your progress. Because it may take several weeks to see the full effects of this medication, it is important to continue your treatment as prescribed by your care team. This medication may cause serious skin reactions. They can happen weeks to months after starting the medication. Contact your care team right away if you notice fevers or flu-like symptoms with a rash. The rash may be red or purple and then turn into blisters or peeling of the skin. Or, you might notice a red rash with swelling of the face, lips, or lymph nodes in your neck or under your arms. Watch for new or worsening thoughts of suicide or depression. This includes sudden changes in mood, behaviors, or thoughts. These changes can happen at any time but are more common in the beginning of treatment or after a change in dose. Call your care team right away if you experience these thoughts or worsening depression. Manic episodes may happen in patients with bipolar disorder who take this medication. Watch for changes in feelings or behaviors such as feeling anxious, nervous, agitated, panicky, irritable, hostile, aggressive, impulsive, severely restless, overly excited and hyperactive, or trouble sleeping. These symptoms can happen at any time, but are more common in the beginning of treatment or after a change in dose. Call your care team right away if you notice any of these symptoms. You may get drowsy or dizzy. Do not drive, use machinery, or do anything that needs mental alertness until you know how this medication affects you. Do not stand or sit up quickly, especially if you are an older patient. This reduces the risk of dizzy or fainting spells. Alcohol may interfere with the effect of this medication. Avoid alcoholic drinks. This medication may increase blood sugar. The risk may be  higher in patients who already have diabetes. Ask your care team what you can do to lower your risk of diabetes while taking this medication. This medication can cause an increase in blood pressure. This medication can also cause a sudden drop in your blood pressure, which may make you feel faint and increase the chance of a fall. These effects are most common when you first start the medication or when the dose is increased, or during use of other medications that can cause a sudden drop in blood pressure. Check with your care team for instructions on monitoring your blood pressure while taking this medication. Your mouth may get dry. Chewing sugarless gum or sucking hard candy, and drinking plenty of water, may help. Contact your care team if the problem does not go away or is severe. What side effects may I notice from receiving this medication? Side effects that you should report to your care team as soon as possible: Allergic reactions--skin rash, itching, hives, swelling of the face, lips, tongue, or throat Bleeding--bloody or black, tar-like stools, red or dark brown urine, vomiting blood or brown material that looks like coffee grounds, small, red or purple spots on skin, unusual bleeding or bruising Increase in blood pressure Liver injury--right upper belly pain, loss of appetite, nausea, light-colored stool, dark yellow or brown urine, yellowing skin or eyes, unusual weakness or fatigue Low sodium level--muscle weakness, fatigue, dizziness, headache, confusion Redness, blistering, peeling, or loosening of the skin, including inside the mouth Serotonin syndrome--irritability, confusion, fast or irregular heartbeat, muscle stiffness, twitching muscles, sweating, high fever, seizures, chills, vomiting, diarrhea   Sudden eye pain or change in vision such as blurry vision, seeing halos around lights, vision loss Thoughts of suicide or self-harm, worsening mood, feelings of depression Trouble passing  urine Side effects that usually do not require medical attention (report to your care team if they continue or are bothersome): Change in sex drive or performance Constipation Diarrhea Dizziness Dry mouth Excessive sweating Loss of appetite Nausea Vomiting This list may not describe all possible side effects. Call your doctor for medical advice about side effects. You may report side effects to FDA at 1-800-FDA-1088. Where should I keep my medication? Keep out of the reach of children and pets. Store at room temperature between 15 and 30 degrees C (59 to 86 degrees F). Get rid of any unused medication after the expiration date. To get rid of medications that are no longer needed or have expired: Take the medication to a medication take-back program. Check with your pharmacy or law enforcement to find a location. If you cannot return the medication, check the label or package insert to see if the medication should be thrown out in the garbage or flushed down the toilet. If you are not sure, ask your care team. If it is safe to put it in the trash, take the medication out of the container. Mix the medication with cat litter, dirt, coffee grounds, or other unwanted substance. Seal the mixture in a bag or container. Put it in the trash. NOTE: This sheet is a summary. It may not cover all possible information. If you have questions about this medicine, talk to your doctor, pharmacist, or health care provider.  2023 Elsevier/Gold Standard (2020-10-07 00:00:00)  

## 2022-12-25 ENCOUNTER — Encounter: Payer: Commercial Managed Care - PPO | Admitting: Family Medicine

## 2022-12-25 LAB — COMPLETE METABOLIC PANEL WITH GFR
AG Ratio: 1.2 (calc) (ref 1.0–2.5)
ALT: 12 U/L (ref 6–29)
AST: 12 U/L (ref 10–35)
Albumin: 4.1 g/dL (ref 3.6–5.1)
Alkaline phosphatase (APISO): 91 U/L (ref 37–153)
BUN: 13 mg/dL (ref 7–25)
CO2: 30 mmol/L (ref 20–32)
Calcium: 9.5 mg/dL (ref 8.6–10.4)
Chloride: 99 mmol/L (ref 98–110)
Creat: 0.94 mg/dL (ref 0.50–1.03)
Globulin: 3.5 g/dL (calc) (ref 1.9–3.7)
Glucose, Bld: 121 mg/dL — ABNORMAL HIGH (ref 65–99)
Potassium: 4.1 mmol/L (ref 3.5–5.3)
Sodium: 139 mmol/L (ref 135–146)
Total Bilirubin: 0.2 mg/dL (ref 0.2–1.2)
Total Protein: 7.6 g/dL (ref 6.1–8.1)
eGFR: 74 mL/min/{1.73_m2} (ref >=60)

## 2022-12-25 LAB — C3 AND C4
C3 Complement: 198 mg/dL — ABNORMAL HIGH (ref 83–193)
C4 Complement: 67 mg/dL — ABNORMAL HIGH (ref 15–57)

## 2022-12-25 LAB — PROTEIN / CREATININE RATIO, URINE
Creatinine, Urine: 195 mg/dL (ref 20–275)
Protein/Creat Ratio: 51 mg/g creat (ref 24–184)
Protein/Creatinine Ratio: 0.051 mg/mg creat (ref 0.024–0.184)
Total Protein, Urine: 10 mg/dL (ref 5–24)

## 2022-12-25 LAB — ANA: Anti Nuclear Antibody (ANA): POSITIVE — AB

## 2022-12-25 LAB — CBC WITH DIFFERENTIAL/PLATELET
Absolute Monocytes: 496 cells/uL (ref 200–950)
Basophils Absolute: 41 cells/uL (ref 0–200)
Basophils Relative: 0.6 %
Eosinophils Absolute: 170 cells/uL (ref 15–500)
Eosinophils Relative: 2.5 %
HCT: 37.3 % (ref 35.0–45.0)
Hemoglobin: 12 g/dL (ref 11.7–15.5)
Lymphs Abs: 2761 cells/uL (ref 850–3900)
MCH: 27.2 pg (ref 27.0–33.0)
MCHC: 32.2 g/dL (ref 32.0–36.0)
MCV: 84.6 fL (ref 80.0–100.0)
MPV: 9.5 fL (ref 7.5–12.5)
Monocytes Relative: 7.3 %
Neutro Abs: 3332 cells/uL (ref 1500–7800)
Neutrophils Relative %: 49 %
Platelets: 389 10*3/uL (ref 140–400)
RBC: 4.41 10*6/uL (ref 3.80–5.10)
RDW: 14.9 % (ref 11.0–15.0)
Total Lymphocyte: 40.6 %
WBC: 6.8 10*3/uL (ref 3.8–10.8)

## 2022-12-25 LAB — SEDIMENTATION RATE: Sed Rate: 67 mm/h — ABNORMAL HIGH (ref 0–20)

## 2022-12-25 LAB — RHEUMATOID FACTOR: Rheumatoid fact SerPl-aCnc: <14 IU/mL (ref <14)

## 2022-12-25 LAB — ANTI-DNA ANTIBODY, DOUBLE-STRANDED: ds DNA Ab: 1 IU/mL

## 2022-12-25 LAB — CYCLIC CITRUL PEPTIDE ANTIBODY, IGG: Cyclic Citrullin Peptide Ab: <16 UNITS

## 2022-12-25 LAB — TSH: TSH: 2.89 mIU/L

## 2022-12-25 LAB — ANTI-NUCLEAR AB-TITER (ANA TITER): ANA Titer 1: 1:320 {titer} — ABNORMAL HIGH

## 2022-12-25 LAB — CK: Total CK: 101 U/L (ref 29–143)

## 2022-12-28 NOTE — Progress Notes (Signed)
ANA remains positive.  dsDNA is negative.  CK WNL. TSH WNL.  Protein creatinine ratio WNL.  RF and anti-CCP negative.  Glucose is 121. Rest of CMP WNL.   CBC WNL.  Complements are not low.  ESR remains elevated.   Recent ultrasound revealed mild inflammation in the right 2nd MCP. If she continues to have persist pain and inflammation in her hands we will discuss treatment options at her upcoming follow up visit.

## 2023-01-11 ENCOUNTER — Other Ambulatory Visit (HOSPITAL_COMMUNITY): Payer: Self-pay

## 2023-01-20 ENCOUNTER — Encounter: Payer: Self-pay | Admitting: Family Medicine

## 2023-01-20 ENCOUNTER — Ambulatory Visit (INDEPENDENT_AMBULATORY_CARE_PROVIDER_SITE_OTHER): Payer: Commercial Managed Care - PPO | Admitting: Family Medicine

## 2023-01-20 ENCOUNTER — Other Ambulatory Visit: Payer: Self-pay

## 2023-01-20 VITALS — BP 130/82 | HR 82 | Temp 98.0°F | Resp 17 | Ht 65.0 in | Wt 346.5 lb

## 2023-01-20 DIAGNOSIS — Z6841 Body Mass Index (BMI) 40.0 and over, adult: Secondary | ICD-10-CM | POA: Diagnosis not present

## 2023-01-20 DIAGNOSIS — M5136 Other intervertebral disc degeneration, lumbar region: Secondary | ICD-10-CM

## 2023-01-20 DIAGNOSIS — E559 Vitamin D deficiency, unspecified: Secondary | ICD-10-CM | POA: Diagnosis not present

## 2023-01-20 DIAGNOSIS — E0842 Diabetes mellitus due to underlying condition with diabetic polyneuropathy: Secondary | ICD-10-CM

## 2023-01-20 DIAGNOSIS — E1142 Type 2 diabetes mellitus with diabetic polyneuropathy: Secondary | ICD-10-CM

## 2023-01-20 DIAGNOSIS — E785 Hyperlipidemia, unspecified: Secondary | ICD-10-CM

## 2023-01-20 DIAGNOSIS — N951 Menopausal and female climacteric states: Secondary | ICD-10-CM

## 2023-01-20 DIAGNOSIS — Z0000 Encounter for general adult medical examination without abnormal findings: Secondary | ICD-10-CM | POA: Diagnosis not present

## 2023-01-20 DIAGNOSIS — K219 Gastro-esophageal reflux disease without esophagitis: Secondary | ICD-10-CM

## 2023-01-20 DIAGNOSIS — N644 Mastodynia: Secondary | ICD-10-CM

## 2023-01-20 DIAGNOSIS — M51369 Other intervertebral disc degeneration, lumbar region without mention of lumbar back pain or lower extremity pain: Secondary | ICD-10-CM

## 2023-01-20 DIAGNOSIS — E1169 Type 2 diabetes mellitus with other specified complication: Secondary | ICD-10-CM

## 2023-01-20 MED ORDER — PANTOPRAZOLE SODIUM 40 MG PO TBEC
40.0000 mg | DELAYED_RELEASE_TABLET | Freq: Every day | ORAL | 3 refills | Status: DC
  Filled 2023-01-20: qty 90, 90d supply, fill #0
  Filled 2023-06-27: qty 90, 90d supply, fill #1
  Filled 2024-01-13: qty 90, 90d supply, fill #2

## 2023-01-20 MED ORDER — LEVOCETIRIZINE DIHYDROCHLORIDE 5 MG PO TABS
5.0000 mg | ORAL_TABLET | Freq: Every day | ORAL | 1 refills | Status: DC
  Filled 2023-01-20: qty 90, 90d supply, fill #0
  Filled 2023-04-26: qty 90, 90d supply, fill #1

## 2023-01-20 MED ORDER — CYCLOBENZAPRINE HCL 10 MG PO TABS
10.0000 mg | ORAL_TABLET | Freq: Every day | ORAL | 0 refills | Status: DC
  Filled 2023-01-20: qty 30, 30d supply, fill #0

## 2023-01-20 NOTE — Patient Instructions (Signed)
Follow up in 3-4 months to recheck diabetes and weight loss progress We'll notify you of your lab results and make any changes if needed RESTART the Pantoprazole daily to help w/ reflux START the Xyzal daily for allergies We'll call you to schedule your diagnostic mammogram Continue the cocoa butter on the nipples Before eating, ask yourself, 'Am I hungry?' Try and get regular physical activity- walking is a great start! Call with any questions or concerns Stay Safe!  Stay Healthy! Hang in there!!!

## 2023-01-20 NOTE — Progress Notes (Signed)
   Subjective:    Patient ID: Vanessa Reeves, female    DOB: Jan 18, 1971, 52 y.o.   MRN: [id number]  HPI CPE- due for mammo. UTD on colonoscopy, eye exam, microalbumin, foot exam, Tdap, flu  Patient Care Team    Relationship Specialty Notifications Start End  Sheliah Hatch, MD PCP - General Family Medicine  07/17/13    Comment: Josephine Cables, OD Consulting Physician Optometry  11/26/15   Lenda Kelp, MD Consulting Physician Sports Medicine  11/26/15   Glendale Chard, DO Consulting Physician Neurology  11/26/15      Health Maintenance  Topic Date Due   MAMMOGRAM  08/12/2019   HEMOGLOBIN A1C  01/05/2023   Zoster Vaccines- Shingrix (1 of 2) 04/22/2023 (Originally 10/22/1991)   OPHTHALMOLOGY EXAM  04/03/2023   Diabetic kidney evaluation - eGFR measurement  12/24/2023   Diabetic kidney evaluation - Urine ACR  12/24/2023   FOOT EXAM  01/20/2024   DTaP/Tdap/Td (2 - Td or Tdap) 10/13/2025   COLONOSCOPY (Pts 45-74yrs Insurance coverage will need to be confirmed)  07/10/2031   INFLUENZA VACCINE  Completed   Hepatitis C Screening  Completed   HIV Screening  Completed   HPV VACCINES  Aged Out   COVID-19 Vaccine  Discontinued      Review of Systems Patient reports no vision/ hearing changes, adenopathy,fever, weight change,  persistant/recurrent hoarseness, chest pain, palpitations, edema, persistant/recurrent cough, hemoptysis, dyspnea (rest/exertional/paroxysmal nocturnal), gastrointestinal bleeding (melena, rectal bleeding), abdominal pain, bowel changes, GU symptoms (dysuria, hematuria, incontinence), Gyn symptoms (abnormal  bleeding, pain),  syncope, focal weakness, memory loss, numbness & tingling, skin/hair/nail changes, abnormal bruising or bleeding, anxiety, or depression.   + nipple pain + dysphagia  + worsening GERD    Objective:   Physical Exam General Appearance:    Alert, cooperative, no distress, appears stated age, obese  Head:    Normocephalic,  without obvious abnormality, atraumatic  Eyes:    PERRL, conjunctiva/corneas clear, EOM's intact both eyes  Ears:    Normal TM's and external ear canals, both ears  Nose:   Nares normal, septum midline, mucosa normal, no drainage    or sinus tenderness  Throat:   Lips, mucosa, and tongue normal; teeth and gums normal  Neck:   Supple, symmetrical, trachea midline, no adenopathy;    Thyroid: no enlargement/tenderness/nodules  Back:     Symmetric, no curvature, ROM normal, no CVA tenderness  Lungs:     Clear to auscultation bilaterally, respirations unlabored  Chest Wall:    No tenderness or deformity   Heart:    Regular rate and rhythm, S1 and S2 normal, no murmur, rub   or gallop  Breast Exam:    Deferred to GYN  Abdomen:     Soft, non-tender, bowel sounds active all four quadrants,    no masses, no organomegaly  Genitalia:    Deferred to GYN  Rectal:    Extremities:   Extremities normal, atraumatic, no cyanosis or edema  Pulses:   2+ and symmetric all extremities  Skin:   Skin color, texture, turgor normal, no rashes or lesions  Lymph nodes:   Cervical, supraclavicular, and axillary nodes normal  Neurologic:   CNII-XII intact, normal strength, sensation and reflexes    throughout          Assessment & Plan:

## 2023-01-21 ENCOUNTER — Other Ambulatory Visit: Payer: Self-pay

## 2023-01-21 ENCOUNTER — Other Ambulatory Visit (HOSPITAL_COMMUNITY): Payer: Self-pay

## 2023-01-21 DIAGNOSIS — E559 Vitamin D deficiency, unspecified: Secondary | ICD-10-CM

## 2023-01-21 LAB — LIPID PANEL
Cholesterol: 152 mg/dL (ref 0–200)
HDL: 59.3 mg/dL (ref >39.00)
LDL Cholesterol: 76 mg/dL (ref 0–99)
NonHDL: 92.33
Total CHOL/HDL Ratio: 3
Triglycerides: 83 mg/dL (ref 0.0–149.0)
VLDL: 16.6 mg/dL (ref 0.0–40.0)

## 2023-01-21 LAB — CBC WITH DIFFERENTIAL/PLATELET
Basophils Absolute: 0 10*3/uL (ref 0.0–0.1)
Basophils Relative: 0.3 % (ref 0.0–3.0)
Eosinophils Absolute: 0.1 10*3/uL (ref 0.0–0.7)
Eosinophils Relative: 1.9 % (ref 0.0–5.0)
HCT: 37.5 % (ref 36.0–46.0)
Hemoglobin: 12.3 g/dL (ref 12.0–15.0)
Lymphocytes Relative: 34.2 % (ref 12.0–46.0)
Lymphs Abs: 2.6 10*3/uL (ref 0.7–4.0)
MCHC: 32.7 g/dL (ref 30.0–36.0)
MCV: 83.7 fl (ref 78.0–100.0)
Monocytes Absolute: 0.6 10*3/uL (ref 0.1–1.0)
Monocytes Relative: 7.4 % (ref 3.0–12.0)
Neutro Abs: 4.2 10*3/uL (ref 1.4–7.7)
Neutrophils Relative %: 56.2 % (ref 43.0–77.0)
Platelets: 402 10*3/uL — ABNORMAL HIGH (ref 150.0–400.0)
RBC: 4.48 Mil/uL (ref 3.87–5.11)
RDW: 16.2 % — ABNORMAL HIGH (ref 11.5–15.5)
WBC: 7.5 10*3/uL (ref 4.0–10.5)

## 2023-01-21 LAB — VITAMIN D 25 HYDROXY (VIT D DEFICIENCY, FRACTURES): VITD: 15.65 ng/mL — ABNORMAL LOW (ref 30.00–100.00)

## 2023-01-21 LAB — HEPATIC FUNCTION PANEL
ALT: 11 U/L (ref 0–35)
AST: 12 U/L (ref 0–37)
Albumin: 3.9 g/dL (ref 3.5–5.2)
Alkaline Phosphatase: 89 U/L (ref 39–117)
Bilirubin, Direct: 0.1 mg/dL (ref 0.0–0.3)
Total Bilirubin: 0.3 mg/dL (ref 0.2–1.2)
Total Protein: 7.6 g/dL (ref 6.0–8.3)

## 2023-01-21 LAB — BASIC METABOLIC PANEL
BUN: 13 mg/dL (ref 6–23)
CO2: 32 mEq/L (ref 19–32)
Calcium: 9.6 mg/dL (ref 8.4–10.5)
Chloride: 102 mEq/L (ref 96–112)
Creatinine, Ser: 0.93 mg/dL (ref 0.40–1.20)
GFR: 71.8 mL/min (ref >60.00)
Glucose, Bld: 105 mg/dL — ABNORMAL HIGH (ref 70–99)
Potassium: 4.3 mEq/L (ref 3.5–5.1)
Sodium: 140 mEq/L (ref 135–145)

## 2023-01-21 LAB — TSH: TSH: 3.01 u[IU]/mL (ref 0.35–5.50)

## 2023-01-21 LAB — LUTEINIZING HORMONE: LH: 23.22 m[IU]/mL

## 2023-01-21 LAB — FOLLICLE STIMULATING HORMONE: FSH: 25.9 m[IU]/mL

## 2023-01-21 LAB — HEMOGLOBIN A1C: Hgb A1c MFr Bld: 7 % — ABNORMAL HIGH (ref 4.6–6.5)

## 2023-01-21 MED ORDER — VITAMIN D (ERGOCALCIFEROL) 1.25 MG (50000 UNIT) PO CAPS
50000.0000 [IU] | ORAL_CAPSULE | ORAL | 0 refills | Status: DC
  Filled 2023-01-21: qty 12, 84d supply, fill #0

## 2023-01-21 MED ORDER — VITAMIN D (ERGOCALCIFEROL) 1.25 MG (50000 UNIT) PO CAPS
50000.0000 [IU] | ORAL_CAPSULE | ORAL | 3 refills | Status: DC
  Filled 2023-01-21: qty 5, 35d supply, fill #0

## 2023-01-22 ENCOUNTER — Encounter: Payer: Self-pay | Admitting: Family Medicine

## 2023-01-22 ENCOUNTER — Other Ambulatory Visit (HOSPITAL_COMMUNITY): Payer: Self-pay

## 2023-01-22 ENCOUNTER — Other Ambulatory Visit: Payer: Self-pay

## 2023-01-22 MED ORDER — TRAMADOL-ACETAMINOPHEN 37.5-325 MG PO TABS
1.0000 | ORAL_TABLET | Freq: Four times a day (QID) | ORAL | 0 refills | Status: DC | PRN
  Filled 2023-01-22 – 2023-01-23 (×2): qty 30, 8d supply, fill #0

## 2023-01-22 NOTE — Telephone Encounter (Signed)
Tramadol 37.5-325 Was in office 01/20/23

## 2023-01-22 NOTE — Telephone Encounter (Signed)
Pt aware Rx has been sent in.  

## 2023-01-23 ENCOUNTER — Other Ambulatory Visit (HOSPITAL_COMMUNITY): Payer: Self-pay

## 2023-01-25 ENCOUNTER — Other Ambulatory Visit: Payer: Self-pay | Admitting: Family Medicine

## 2023-01-25 ENCOUNTER — Other Ambulatory Visit (HOSPITAL_COMMUNITY): Payer: Self-pay

## 2023-01-25 DIAGNOSIS — E0842 Diabetes mellitus due to underlying condition with diabetic polyneuropathy: Secondary | ICD-10-CM

## 2023-01-25 NOTE — Telephone Encounter (Signed)
Patient is requesting a refill of the following medications: Requested Prescriptions   Pending Prescriptions Disp Refills   traZODone (DESYREL) 50 MG tablet 30 tablet 3    Sig: TAKE 1/2 TO 1 TABLET BY MOUTH AT BEDTIME AS NEEDED FOR SLEEP   albuterol (VENTOLIN HFA) 108 (90 Base) MCG/ACT inhaler 18 g 3    Sig: Inhale 2 puffs into the lungs every 6  hours as needed for wheezing or shortness of breath.   Blood Glucose Monitoring Suppl (FREESTYLE LITE) w/Device KIT 1 kit 2    Sig: Use as directed to test blood glucose once to twice daily.    Date of patient request: 01/25/23 Last office visit: 01/20/23 Date of last refill: 08/26/2021 Last refill amount: 30 Follow up time period per chart: 3 months

## 2023-01-26 ENCOUNTER — Other Ambulatory Visit: Payer: Self-pay | Admitting: Family Medicine

## 2023-01-26 ENCOUNTER — Other Ambulatory Visit (HOSPITAL_COMMUNITY): Payer: Self-pay

## 2023-01-26 ENCOUNTER — Other Ambulatory Visit: Payer: Self-pay

## 2023-01-26 DIAGNOSIS — N644 Mastodynia: Secondary | ICD-10-CM

## 2023-01-26 MED ORDER — ALBUTEROL SULFATE HFA 108 (90 BASE) MCG/ACT IN AERS
2.0000 | INHALATION_SPRAY | Freq: Four times a day (QID) | RESPIRATORY_TRACT | 3 refills | Status: DC | PRN
  Filled 2023-01-26: qty 6.7, 25d supply, fill #0

## 2023-01-26 MED ORDER — TRAZODONE HCL 50 MG PO TABS
ORAL_TABLET | ORAL | 3 refills | Status: AC
  Filled 2023-01-26: qty 30, 30d supply, fill #0
  Filled 2024-01-13: qty 30, 30d supply, fill #1

## 2023-01-26 MED ORDER — FREESTYLE LITE W/DEVICE KIT
PACK | 2 refills | Status: DC
  Filled 2023-01-26: qty 1, 28d supply, fill #0

## 2023-02-05 ENCOUNTER — Other Ambulatory Visit: Payer: Self-pay

## 2023-02-24 ENCOUNTER — Other Ambulatory Visit (HOSPITAL_COMMUNITY): Payer: Self-pay

## 2023-02-24 ENCOUNTER — Other Ambulatory Visit: Payer: Self-pay

## 2023-02-24 ENCOUNTER — Other Ambulatory Visit: Payer: Self-pay | Admitting: Family Medicine

## 2023-02-24 MED ORDER — ALPRAZOLAM 0.5 MG PO TABS
0.5000 mg | ORAL_TABLET | Freq: Two times a day (BID) | ORAL | 1 refills | Status: DC | PRN
  Filled 2023-02-24: qty 30, 15d supply, fill #0
  Filled 2023-05-17: qty 30, 15d supply, fill #1

## 2023-02-24 NOTE — Telephone Encounter (Signed)
Xanax 0.5 mg LOV: 01/20/23 Last Refill:9/523 Upcoming appt: 04/22/23

## 2023-02-25 NOTE — Telephone Encounter (Signed)
Pt aware Rx has been sent in.  

## 2023-03-05 ENCOUNTER — Other Ambulatory Visit: Payer: Self-pay | Admitting: Physician Assistant

## 2023-03-05 ENCOUNTER — Other Ambulatory Visit (HOSPITAL_COMMUNITY): Payer: Self-pay

## 2023-03-05 DIAGNOSIS — M359 Systemic involvement of connective tissue, unspecified: Secondary | ICD-10-CM

## 2023-03-08 ENCOUNTER — Other Ambulatory Visit: Payer: Self-pay

## 2023-03-08 ENCOUNTER — Encounter: Payer: Self-pay | Admitting: *Deleted

## 2023-03-08 MED ORDER — HYDROXYCHLOROQUINE SULFATE 200 MG PO TABS
200.0000 mg | ORAL_TABLET | Freq: Two times a day (BID) | ORAL | 0 refills | Status: DC
Start: 2023-03-08 — End: 2023-10-04
  Filled 2023-03-08: qty 180, 90d supply, fill #0

## 2023-03-08 NOTE — Telephone Encounter (Signed)
Last Fill: 07/14/2022  Eye exam: 04/02/2022 WNL   Labs: 01/20/2023 RDW 16.2, Platelets 402, Glucose 105  Next Visit: 03/24/2023  Last Visit: 12/23/2022  DX:Autoimmune disease   Current Dose per office note 12/23/2022: Plaquenil 200 mg 1 tablet by mouth twice daily.   Okay to refill Plaquenil?

## 2023-03-10 NOTE — Progress Notes (Deleted)
Office Visit Note  Patient: Vanessa Reeves             Date of Birth: [birth year]           MRN: [id number]             PCP: Sheliah Hatch, MD Referring: Sheliah Hatch, MD Visit Date: 03/24/2023 Occupation: @GUAROCC @  Subjective:    History of Present Illness: Vanessa Reeves is a 52 y.o. female with history of autoimmune disease.  She is taking plaquenil 200 mg 1 tablet by mouth twice daily.   CBC, BMP, and hepatic function panel updated on 01/20/23.   PLQ Eye Exam: 04/02/2022 WNL @ [facility] Follow up 1 year   Activities of Daily Living:  Patient reports morning stiffness for *** {minute/hour:19697}.   Patient {ACTIONS;DENIES/REPORTS:21021675::"Denies"} nocturnal pain.  Difficulty dressing/grooming: {ACTIONS;DENIES/REPORTS:21021675::"Denies"} Difficulty climbing stairs: {ACTIONS;DENIES/REPORTS:21021675::"Denies"} Difficulty getting out of chair: {ACTIONS;DENIES/REPORTS:21021675::"Denies"} Difficulty using hands for taps, buttons, cutlery, and/or writing: {ACTIONS;DENIES/REPORTS:21021675::"Denies"}  No Rheumatology ROS completed.   PMFS History:  Patient Active Problem List   Diagnosis Date Noted   Polyp of transverse colon    Polyp of sigmoid colon    Polyp of rectum    Adjustment disorder with depressed mood 10/24/2020   Hearing loss of right ear 09/04/2020   Hyperlipidemia associated with type 2 diabetes mellitus (HCC) 02/21/2020   Insomnia 02/21/2020   Vitamin D deficiency 02/21/2020   Degenerative disc disease, lumbar 02/21/2020   Diabetic polyneuropathy associated with diabetes mellitus due to underlying condition (HCC) 06/14/2019   Special screening for malignant neoplasms, colon 04/11/2018   Memory changes 11/23/2017   RLS (restless legs syndrome) 03/17/2017   ADHD (attention deficit hyperactivity disorder), 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   Reactive airway disease 08/21/2016   Gastroesophageal reflux disease without esophagitis 12/20/2015    Physical exam 12/14/2014   Breast mass, left 02/16/2014   Depression with anxiety 07/17/2013   Essential hypertension 07/17/2013   Eustachian tube dysfunction 07/17/2013   Adult BMI 50.0-59.9 kg/sq m (HCC) 07/17/2013    Past Medical History:  Diagnosis Date   Allergy    Anxiety    Arthritis    HANDS   Asthma    HAS MDI   Complication of anesthesia    Depression    Diabetes mellitus without complication (HCC)    Difficult airway for intubation    2013 used Video Laryngoscope   GERD (gastroesophageal reflux disease)    Herpes    Hyperlipidemia    Hypertension    RLS (restless legs syndrome) 03/17/2017   Right leg   Special screening for malignant neoplasms, colon 04/11/2018    Family History  Problem Relation Age of Onset   Hypertension Mother    Clotting disorder Mother    Stroke Father    Thrombosis Father    Hypertension Paternal Grandmother    Diabetes Mellitus II Paternal Grandmother    Lupus Paternal Grandmother    Healthy Daughter    Healthy Son    Colon cancer Neg Hx    Esophageal cancer Neg Hx    Pancreatic cancer Neg Hx    Stomach cancer Neg Hx    Liver disease Neg Hx    Colon polyps Neg Hx    Past Surgical History:  Procedure Laterality Date   COLONOSCOPY WITH PROPOFOL N/A 07/09/2021   Procedure: COLONOSCOPY WITH PROPOFOL;  Surgeon: Napoleon Form, MD;  Location: [facility];  Service: Endoscopy;  Laterality: N/A;   DILATION AND CURETTAGE OF UTERUS  POLYPECTOMY  07/09/2021   Procedure: POLYPECTOMY;  Surgeon: Napoleon Form, MD;  Location: [facility];  Service: Endoscopy;;   TOTAL ABDOMINAL HYSTERECTOMY     TUBAL LIGATION     Social History   Social History Narrative   Lives with husband and 2 children in a 2 story home.     Works as a Psychologist, sport and exercise at [employer].     Highest level of education:  High school, in college now   Immunization History  Administered Date(s) Administered   Influenza,inj,Quad PF,6+ Mos 07/17/2013,  07/27/2014, 07/26/2015, 08/21/2016, 07/06/2017, 07/29/2018, 07/03/2019, 07/07/2022   Influenza-Unspecified 08/20/2020, 08/19/2021   MMR 03/08/2017, 04/05/2017   Moderna Sars-Covid-2 Vaccination 06/06/2020, 07/04/2020   PPD Test 07/26/2015, 03/07/2018, 05/23/2018   Pneumococcal Polysaccharide-23 01/16/2015   Tdap 10/14/2015     Objective: Vital Signs: LMP 12/04/2011    Physical Exam Vitals and nursing note reviewed.  Constitutional:      Appearance: She is well-developed.  HENT:     Head: Normocephalic and atraumatic.  Eyes:     Conjunctiva/sclera: Conjunctivae normal.  Cardiovascular:     Rate and Rhythm: Normal rate and regular rhythm.     Heart sounds: Normal heart sounds.  Pulmonary:     Effort: Pulmonary effort is normal.     Breath sounds: Normal breath sounds.  Abdominal:     General: Bowel sounds are normal.     Palpations: Abdomen is soft.  Musculoskeletal:     Cervical back: Normal range of motion.  Lymphadenopathy:     Cervical: No cervical adenopathy.  Skin:    General: Skin is warm and dry.     Capillary Refill: Capillary refill takes less than 2 seconds.  Neurological:     Mental Status: She is alert and oriented to person, place, and time.  Psychiatric:        Behavior: Behavior normal.      Musculoskeletal Exam: ***  CDAI Exam: CDAI Score: -- Patient Global: --; Provider Global: -- Swollen: --; Tender: -- Joint Exam 03/24/2023   No joint exam has been documented for this visit   There is currently no information documented on the homunculus. Go to the Rheumatology activity and complete the homunculus joint exam.  Investigation: No additional findings.  Imaging: No results found.  Recent Labs: Lab Results  Component Value Date   WBC 7.5 01/20/2023   HGB 12.3 01/20/2023   PLT 402.0 (H) 01/20/2023   NA 140 01/20/2023   K 4.3 01/20/2023   CL 102 01/20/2023   CO2 32 01/20/2023   GLUCOSE 105 (H) 01/20/2023   BUN 13 01/20/2023    CREATININE 0.93 01/20/2023   BILITOT 0.3 01/20/2023   ALKPHOS 89 01/20/2023   AST 12 01/20/2023   ALT 11 01/20/2023   PROT 7.6 01/20/2023   ALBUMIN 3.9 01/20/2023   CALCIUM 9.6 01/20/2023   GFRAA 74 12/16/2020   QFTBGOLDPLUS NEGATIVE 07/03/2019    Speciality Comments: PLQ Eye Exam: 04/02/2022 WNL  @ [facility] Follow up 1 year   Procedures:  No procedures performed Allergies: Penicillins   Assessment / Plan:     Visit Diagnoses: Autoimmune disease (HCC)  High risk medication use  Myofascial pain  Other fatigue  Family history of blood clots  Chronic SI joint pain  DDD (degenerative disc disease), lumbar  Facet arthropathy  Vitamin D deficiency  Diabetic polyneuropathy associated with diabetes mellitus due to underlying condition (HCC)  Essential hypertension  Moderate persistent reactive airway disease with acute  exacerbation  Former smoker  RLS (restless legs syndrome)  Depression with anxiety  Gastroesophageal reflux disease without esophagitis  ADHD (attention deficit hyperactivity disorder), inattentive type  Orders: No orders of the defined types were placed in this encounter.  No orders of the defined types were placed in this encounter.   Face-to-face time spent with patient was *** minutes. Greater than 50% of time was spent in counseling and coordination of care.  Follow-Up Instructions: No follow-ups on file.   Gearldine Bienenstock, PA-C  Note - This record has been created using Dragon software.  Chart creation errors have been sought, but may not always  have been located. Such creation errors do not reflect on  the standard of medical care.

## 2023-03-19 ENCOUNTER — Other Ambulatory Visit: Payer: Commercial Managed Care - PPO

## 2023-03-23 ENCOUNTER — Ambulatory Visit: Payer: Commercial Managed Care - PPO | Attending: Physician Assistant | Admitting: Physician Assistant

## 2023-03-23 ENCOUNTER — Encounter: Payer: Self-pay | Admitting: Physician Assistant

## 2023-03-23 ENCOUNTER — Other Ambulatory Visit: Payer: Self-pay

## 2023-03-23 ENCOUNTER — Other Ambulatory Visit (HOSPITAL_COMMUNITY): Payer: Self-pay

## 2023-03-23 VITALS — BP 156/88 | HR 69 | Resp 17 | Ht 65.0 in | Wt 339.4 lb

## 2023-03-23 DIAGNOSIS — M7918 Myalgia, other site: Secondary | ICD-10-CM | POA: Diagnosis not present

## 2023-03-23 DIAGNOSIS — M359 Systemic involvement of connective tissue, unspecified: Secondary | ICD-10-CM | POA: Diagnosis not present

## 2023-03-23 DIAGNOSIS — E0842 Diabetes mellitus due to underlying condition with diabetic polyneuropathy: Secondary | ICD-10-CM | POA: Diagnosis not present

## 2023-03-23 DIAGNOSIS — F418 Other specified anxiety disorders: Secondary | ICD-10-CM

## 2023-03-23 DIAGNOSIS — M79641 Pain in right hand: Secondary | ICD-10-CM | POA: Diagnosis not present

## 2023-03-23 DIAGNOSIS — Z8249 Family history of ischemic heart disease and other diseases of the circulatory system: Secondary | ICD-10-CM

## 2023-03-23 DIAGNOSIS — M533 Sacrococcygeal disorders, not elsewhere classified: Secondary | ICD-10-CM

## 2023-03-23 DIAGNOSIS — R110 Nausea: Secondary | ICD-10-CM

## 2023-03-23 DIAGNOSIS — M79642 Pain in left hand: Secondary | ICD-10-CM

## 2023-03-23 DIAGNOSIS — I10 Essential (primary) hypertension: Secondary | ICD-10-CM

## 2023-03-23 DIAGNOSIS — G2581 Restless legs syndrome: Secondary | ICD-10-CM

## 2023-03-23 DIAGNOSIS — R5383 Other fatigue: Secondary | ICD-10-CM

## 2023-03-23 DIAGNOSIS — F900 Attention-deficit hyperactivity disorder, predominantly inattentive type: Secondary | ICD-10-CM

## 2023-03-23 DIAGNOSIS — Z79899 Other long term (current) drug therapy: Secondary | ICD-10-CM

## 2023-03-23 DIAGNOSIS — M47819 Spondylosis without myelopathy or radiculopathy, site unspecified: Secondary | ICD-10-CM | POA: Diagnosis not present

## 2023-03-23 DIAGNOSIS — G8929 Other chronic pain: Secondary | ICD-10-CM

## 2023-03-23 DIAGNOSIS — E559 Vitamin D deficiency, unspecified: Secondary | ICD-10-CM

## 2023-03-23 DIAGNOSIS — J4541 Moderate persistent asthma with (acute) exacerbation: Secondary | ICD-10-CM

## 2023-03-23 DIAGNOSIS — M5136 Other intervertebral disc degeneration, lumbar region: Secondary | ICD-10-CM

## 2023-03-23 DIAGNOSIS — Z87891 Personal history of nicotine dependence: Secondary | ICD-10-CM

## 2023-03-23 DIAGNOSIS — K219 Gastro-esophageal reflux disease without esophagitis: Secondary | ICD-10-CM

## 2023-03-23 MED ORDER — ONDANSETRON HCL 4 MG PO TABS
4.0000 mg | ORAL_TABLET | Freq: Three times a day (TID) | ORAL | 1 refills | Status: DC | PRN
Start: 2023-03-23 — End: 2023-07-28
  Filled 2023-03-23: qty 30, 10d supply, fill #0
  Filled 2023-06-01: qty 30, 10d supply, fill #1

## 2023-03-23 NOTE — Patient Instructions (Signed)
Standing Labs We placed an order today for your standing lab work.   Please have your standing labs drawn in August and every 5 months    Please have your labs drawn 2 weeks prior to your appointment so that the provider can discuss your lab results at your appointment, if possible.  Please note that you may see your imaging and lab results in MyChart before we have reviewed them. We will contact you once all results are reviewed. Please allow our office up to 72 hours to thoroughly review all of the results before contacting the office for clarification of your results.  WALK-IN LAB HOURS  Monday through Thursday from 8:00 am -12:30 pm and 1:00 pm-5:00 pm and Friday from 8:00 am-12:00 pm.  Patients with office visits requiring labs will be seen before walk-in labs.  You may encounter longer than normal wait times. Please allow additional time. Wait times may be shorter on  Monday and Thursday afternoons.  We do not book appointments for walk-in labs. We appreciate your patience and understanding with our staff.   Labs are drawn by [facility]. Please bring your co-pay at the time of your lab draw.  You may receive a bill from [facility] for your lab work.  Please note if you are on Hydroxychloroquine and and an order has been placed for a Hydroxychloroquine level,  you will need to have it drawn 4 hours or more after your last dose.  If you wish to have your labs drawn at another location, please call the office 24 hours in advance so we can fax the orders.  The office is located at [address], [address], [place], [place] [address]   If you have any questions regarding directions or hours of operation,  please call [phone].   As a reminder, please drink plenty of water prior to coming for your lab work. Thanks!  

## 2023-03-23 NOTE — Telephone Encounter (Signed)
Please review and send pended refill for zofran. Thanks!

## 2023-03-23 NOTE — Progress Notes (Addendum)
Office Visit Note  Patient: Vanessa Reeves             Date of Birth: 1971/07/21           MRN: [id number]             PCP: Sheliah Hatch, MD Referring: Sheliah Hatch, MD Visit Date: 03/23/2023 Occupation: @GUAROCC @  Subjective:  Pain in both hands   History of Present Illness: Vanessa Reeves is a 52 y.o. female with history rheumatoid arthritis.  She is taking Plaquenil 200 mg 1 tablet by mouth twice daily.  She is tolerating Plaquenil without any side effects.  Patient states that she has been under tremendous mental stress recently since her father passed away last month.  She is been having increased generalized pain and joint stiffness.  She feels that her fibromyalgia has been flaring due to the increased stress.  She has ongoing pain in both hands.  She has been taking Tylenol, Ultracet, Flexeril for symptomatic relief.  She is also been taking trazodone and gabapentin at bedtime to help her sleep.  She has been using a vibrating massager and warm compresses for pain relief.    Activities of Daily Living:  Patient reports morning stiffness for a few minutes.   Patient Reports nocturnal pain.  Difficulty dressing/grooming: Denies Difficulty climbing stairs: Reports Difficulty getting out of chair: Reports Difficulty using hands for taps, buttons, cutlery, and/or writing: Reports  Review of Systems  Constitutional:  Positive for fatigue.  HENT:  Positive for mouth dryness. Negative for mouth sores.   Eyes:  Positive for dryness.  Respiratory:  Positive for shortness of breath.   Cardiovascular:  Negative for chest pain and palpitations.  Gastrointestinal:  Positive for constipation. Negative for blood in stool and diarrhea.  Endocrine: Positive for increased urination.  Genitourinary:  Positive for involuntary urination.  Musculoskeletal:  Positive for joint pain, gait problem, joint pain, joint swelling, myalgias, muscle weakness, morning stiffness, muscle  tenderness and myalgias.  Skin:  Positive for color change, rash, hair loss and sensitivity to sunlight.  Allergic/Immunologic: Positive for susceptible to infections.  Neurological:  Negative for dizziness and headaches.  Hematological:  Negative for swollen glands.  Psychiatric/Behavioral:  Positive for depressed mood and sleep disturbance. The patient is nervous/anxious.     PMFS History:  Patient Active Problem List   Diagnosis Date Noted   Polyp of transverse colon    Polyp of sigmoid colon    Polyp of rectum    Adjustment disorder with depressed mood 10/24/2020   Hearing loss of right ear 09/04/2020   Hyperlipidemia associated with type 2 diabetes mellitus (HCC) 02/21/2020   Insomnia 02/21/2020   Vitamin D deficiency 02/21/2020   Degenerative disc disease, lumbar 02/21/2020   Diabetic polyneuropathy associated with diabetes mellitus due to underlying condition (HCC) 06/14/2019   Special screening for malignant neoplasms, colon 04/11/2018   Memory changes 11/23/2017   RLS (restless legs syndrome) 03/17/2017   ADHD (attention deficit hyperactivity disorder), 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   Reactive airway disease 08/21/2016   Gastroesophageal reflux disease without esophagitis 12/20/2015   Physical exam 12/14/2014   Breast mass, left 02/16/2014   Depression with anxiety 07/17/2013   Essential hypertension 07/17/2013   Eustachian tube dysfunction 07/17/2013   Adult BMI 50.0-59.9 kg/sq m (HCC) 07/17/2013    Past Medical History:  Diagnosis Date   Allergy    Anxiety    Arthritis    HANDS   Asthma  HAS MDI   Complication of anesthesia    Depression    Diabetes mellitus without complication (HCC)    Difficult airway for intubation    2013 used Video Laryngoscope   GERD (gastroesophageal reflux disease)    Herpes    Hyperlipidemia    Hypertension    RLS (restless legs syndrome) 03/17/2017   Right leg   Special screening for malignant neoplasms, colon 04/11/2018     Family History  Problem Relation Age of Onset   Hypertension Mother    Clotting disorder Mother    Stroke Father    Thrombosis Father    Congestive Heart Failure Father    Hypertension Paternal Grandmother    Diabetes Mellitus II Paternal Grandmother    Lupus Paternal Grandmother    Healthy Daughter    Healthy Son    Colon cancer Neg Hx    Esophageal cancer Neg Hx    Pancreatic cancer Neg Hx    Stomach cancer Neg Hx    Liver disease Neg Hx    Colon polyps Neg Hx    Past Surgical History:  Procedure Laterality Date   COLONOSCOPY WITH PROPOFOL N/A 07/09/2021   Procedure: COLONOSCOPY WITH PROPOFOL;  Surgeon: Napoleon Form, MD;  Location: [facility];  Service: Endoscopy;  Laterality: N/A;   DILATION AND CURETTAGE OF UTERUS     POLYPECTOMY  07/09/2021   Procedure: POLYPECTOMY;  Surgeon: Napoleon Form, MD;  Location: [facility];  Service: Endoscopy;;   TOTAL ABDOMINAL HYSTERECTOMY     TUBAL LIGATION     Social History   Social History Narrative   Lives with husband and 2 children in a 2 story home.     Works as a Psychologist, sport and exercise at [employer].     Highest level of education:  High school, in college now   Immunization History  Administered Date(s) Administered   Influenza,inj,Quad PF,6+ Mos 07/17/2013, 07/27/2014, 07/26/2015, 08/21/2016, 07/06/2017, 07/29/2018, 07/03/2019, 07/07/2022   Influenza-Unspecified 08/20/2020, 08/19/2021   MMR 03/08/2017, 04/05/2017   Moderna Sars-Covid-2 Vaccination 06/06/2020, 07/04/2020   PPD Test 07/26/2015, 03/07/2018, 05/23/2018   Pneumococcal Polysaccharide-23 01/16/2015   Tdap 10/14/2015     Objective: Vital Signs: BP (!) 156/88 (BP Location: Right Wrist, Patient Position: Sitting, Cuff Size: Normal)   Pulse 69   Resp 17   Ht 5\' 5"  (1.651 m)   Wt (!) 339 lb 6.4 oz (154 kg)   LMP 12/04/2011   BMI 56.48 kg/m    Physical Exam Vitals and nursing note reviewed.  Constitutional:      Appearance: She is well-developed.   HENT:     Head: Normocephalic and atraumatic.  Eyes:     Conjunctiva/sclera: Conjunctivae normal.  Cardiovascular:     Rate and Rhythm: Normal rate and regular rhythm.     Heart sounds: Normal heart sounds.  Pulmonary:     Effort: Pulmonary effort is normal.     Breath sounds: Normal breath sounds.  Abdominal:     General: Bowel sounds are normal.     Palpations: Abdomen is soft.  Musculoskeletal:     Cervical back: Normal range of motion.  Skin:    General: Skin is warm and dry.     Capillary Refill: Capillary refill takes less than 2 seconds.  Neurological:     Mental Status: She is alert and oriented to person, place, and time.  Psychiatric:        Behavior: Behavior normal.      Musculoskeletal Exam: C-spine, thoracic  spine, lumbar spine have good range of motion.  Shoulder joints, elbow joints, wrist joints, MCPs, PIPs, DIPs have good range of motion with no synovitis.  Complete fist formation bilaterally.  Hip joints have good range of motion with no groin pain.  Tenderness over bilateral trochanteric bursa.  Knee joints have good range of motion with no warmth or effusion.  Ankle joints have good range of motion with no joint tenderness.  Pedal edema noted bilaterally.  CDAI Exam: CDAI Score: -- Patient Global: --; Provider Global: -- Swollen: --; Tender: -- Joint Exam 03/23/2023   No joint exam has been documented for this visit   There is currently no information documented on the homunculus. Go to the Rheumatology activity and complete the homunculus joint exam.  Investigation: No additional findings.  Imaging: No results found.  Recent Labs: Lab Results  Component Value Date   WBC 7.5 01/20/2023   HGB 12.3 01/20/2023   PLT 402.0 (H) 01/20/2023   NA 140 01/20/2023   K 4.3 01/20/2023   CL 102 01/20/2023   CO2 32 01/20/2023   GLUCOSE 105 (H) 01/20/2023   BUN 13 01/20/2023   CREATININE 0.93 01/20/2023   BILITOT 0.3 01/20/2023   ALKPHOS 89 01/20/2023    AST 12 01/20/2023   ALT 11 01/20/2023   PROT 7.6 01/20/2023   ALBUMIN 3.9 01/20/2023   CALCIUM 9.6 01/20/2023   GFRAA 74 12/16/2020   QFTBGOLDPLUS NEGATIVE 07/03/2019    Speciality Comments: PLQ Eye Exam: 04/02/2022 WNL  @ [facility] Follow up 1 year   Procedures:  No procedures performed Allergies: Penicillins    Assessment / Plan:     Visit Diagnoses: Autoimmune disease (HCC) - AVISE+ANA, +APS IgM positive: Patient has been experiencing increased arthralgias, joint stiffness, dry mouth, and intermittent sores in her mouth.  She is under increased stress recently since grieving the loss of her father.  No obvious synovitis was noted on examination today.  She remains on Plaquenil 200 mg 1 tablet by mouth twice daily.  She is tolerating Plaquenil without any side effects.  She has not missed any doses recently.  A refill of Plaquenil sent to the pharmacy on 03/08/2023. No medication changes will be made at this time. Future orders for the following lab work will be placed today--she will be having updated lab work in August.  She was advised to notify us if she develops signs or symptoms of a flare. She will follow up in 5 months or sooner if needed.   Patient is applying for FMLA--ok to proceed with completing paperwork.   High risk medication use - Plaquenil 200 mg 1 tablet by mouth twice daily.  CBC, hepatic function panel, and BMP updated 01/20/23.  Her next lab work will be due in August and every 3 months to monitor for drug toxicity. PLQ Eye Exam: 04/02/2022 WNL @ [facility] Follow up 1 year.  She was given a block on examination to take with her to her upcoming appointment. - Plan: CBC with Differential/Platelet, COMPLETE METABOLIC PANEL WITH GFR  Bilateral hand pain: Ultrasound on 12/23/2022 showed mild synovitis in the right second MCP joint.  No synovitis noted today.    Myofascial pain: She has been experiencing increased generalized pain and joint stiffness.   She has been having more frequent flares of myofascial pain since she has been under increased stress grieving the loss of her father.  She has been taking Ultracet and Flexeril as needed for symptomatic relief.  She has also been using a vibrating massager and warm compresses.  Other fatigue: She is been experiencing increased fatigue secondary to insomnia.  She has been under increased rest recently since the loss of her father 1 month ago.  She has been taking trazodone and gabapentin at bedtime but has only been sleeping 4 to 5 hours per night.  Family history of blood clots - Beta-2 glycoprotein antibodies, anticardiolipin antibodies, and lupus anticoagulant negative on 09/16/2021.  Chronic SI joint pain: Chronic pain.  DDD (degenerative disc disease), lumbar - X-rays of lumbar spine were updated on 02/25/22. Chronic pain.  Taking Ultracet and Flexeril as needed for symptomatic relief.  She has been using a Administrator, sports and warm compresses to alleviate her discomfort.  Facet arthropathy - Lumbar X-rays updated on 02/25/22: show grade one anterolisthesis L4-5 2-3 mm no change from 07/2020. No acute changes.  Other medical conditions are listed as follows:  Diabetic polyneuropathy associated with diabetes mellitus due to underlying condition (HCC)  Moderate persistent reactive airway disease with acute exacerbation  Vitamin D deficiency  Essential hypertension: Blood pressure was elevated today in the office and was rechecked prior to leaving.  She was advised to monitor blood pressure closely and to reach out to her PCP if her blood pressure remains elevated.  Gastroesophageal reflux disease without esophagitis  RLS (restless legs syndrome)  Former smoker  ADHD (attention deficit hyperactivity disorder), inattentive type  Depression with anxiety  Orders: Orders Placed This Encounter  Procedures   CBC with Differential/Platelet   COMPLETE METABOLIC PANEL WITH GFR   C3 and  C4   Anti-DNA antibody, double-stranded   Sedimentation rate   Protein / creatinine ratio, urine   ANA   No orders of the defined types were placed in this encounter.    Follow-Up Instructions: Return in about 5 months (around 08/23/2023) for Autoimmune Disease.   Gearldine Bienenstock, PA-C  Note - This record has been created using Dragon software.  Chart creation errors have been sought, but may not always  have been located. Such creation errors do not reflect on  the standard of medical care.

## 2023-03-24 ENCOUNTER — Ambulatory Visit: Payer: Commercial Managed Care - PPO | Admitting: Physician Assistant

## 2023-03-24 DIAGNOSIS — F900 Attention-deficit hyperactivity disorder, predominantly inattentive type: Secondary | ICD-10-CM

## 2023-03-24 DIAGNOSIS — M5136 Other intervertebral disc degeneration, lumbar region: Secondary | ICD-10-CM

## 2023-03-24 DIAGNOSIS — K219 Gastro-esophageal reflux disease without esophagitis: Secondary | ICD-10-CM

## 2023-03-24 DIAGNOSIS — E559 Vitamin D deficiency, unspecified: Secondary | ICD-10-CM

## 2023-03-24 DIAGNOSIS — R5383 Other fatigue: Secondary | ICD-10-CM

## 2023-03-24 DIAGNOSIS — G2581 Restless legs syndrome: Secondary | ICD-10-CM

## 2023-03-24 DIAGNOSIS — Z79899 Other long term (current) drug therapy: Secondary | ICD-10-CM

## 2023-03-24 DIAGNOSIS — E0842 Diabetes mellitus due to underlying condition with diabetic polyneuropathy: Secondary | ICD-10-CM

## 2023-03-24 DIAGNOSIS — F418 Other specified anxiety disorders: Secondary | ICD-10-CM

## 2023-03-24 DIAGNOSIS — I10 Essential (primary) hypertension: Secondary | ICD-10-CM

## 2023-03-24 DIAGNOSIS — G8929 Other chronic pain: Secondary | ICD-10-CM

## 2023-03-24 DIAGNOSIS — Z8249 Family history of ischemic heart disease and other diseases of the circulatory system: Secondary | ICD-10-CM

## 2023-03-24 DIAGNOSIS — J4541 Moderate persistent asthma with (acute) exacerbation: Secondary | ICD-10-CM

## 2023-03-24 DIAGNOSIS — M7918 Myalgia, other site: Secondary | ICD-10-CM

## 2023-03-24 DIAGNOSIS — M359 Systemic involvement of connective tissue, unspecified: Secondary | ICD-10-CM

## 2023-03-24 DIAGNOSIS — Z87891 Personal history of nicotine dependence: Secondary | ICD-10-CM

## 2023-03-24 DIAGNOSIS — M47819 Spondylosis without myelopathy or radiculopathy, site unspecified: Secondary | ICD-10-CM

## 2023-03-31 ENCOUNTER — Other Ambulatory Visit: Payer: Self-pay

## 2023-03-31 ENCOUNTER — Other Ambulatory Visit (HOSPITAL_COMMUNITY): Payer: Self-pay

## 2023-03-31 ENCOUNTER — Other Ambulatory Visit: Payer: Self-pay | Admitting: Family Medicine

## 2023-03-31 MED ORDER — CLOTRIMAZOLE-BETAMETHASONE 1-0.05 % EX CREA
1.0000 | TOPICAL_CREAM | Freq: Every day | CUTANEOUS | 0 refills | Status: DC
  Filled 2023-03-31: qty 45, 30d supply, fill #0

## 2023-03-31 MED ORDER — LOSARTAN POTASSIUM 50 MG PO TABS
100.0000 mg | ORAL_TABLET | Freq: Every day | ORAL | 1 refills | Status: DC
  Filled 2023-03-31: qty 60, 30d supply, fill #0

## 2023-03-31 NOTE — Telephone Encounter (Signed)
Is this ok to refill?  

## 2023-04-01 ENCOUNTER — Other Ambulatory Visit: Payer: Self-pay

## 2023-04-01 NOTE — Telephone Encounter (Signed)
Left vm stating rx was sent in  

## 2023-04-04 NOTE — Assessment & Plan Note (Signed)
Pt's PE WNL w/ exception of BMI.  UTD on colonoscopy.  Due for mammo.  Given nipple pain will get diagnostic mammo.  Will also check FSH and LH.  Check labs.  Anticipatory guidance provided.

## 2023-04-04 NOTE — Assessment & Plan Note (Signed)
Check labs and replete prn. 

## 2023-04-04 NOTE — Assessment & Plan Note (Signed)
Deteriorated.  Restart Pantoprazole daily

## 2023-04-04 NOTE — Assessment & Plan Note (Signed)
Check labs and adjust meds prn. 

## 2023-04-04 NOTE — Assessment & Plan Note (Signed)
Chronic problem.  UTD on eye exam, microalbumin, and foot exam.  Check labs.  Adjust meds prn

## 2023-04-04 NOTE — Assessment & Plan Note (Signed)
Ongoing issue.  Stressed need for daily physical activity and only eating when hungry rather than eating when bored or stressed.  Will continue to follow.

## 2023-04-06 DIAGNOSIS — H35033 Hypertensive retinopathy, bilateral: Secondary | ICD-10-CM | POA: Diagnosis not present

## 2023-04-06 LAB — HM DIABETES EYE EXAM

## 2023-04-13 ENCOUNTER — Telehealth: Payer: Self-pay | Admitting: *Deleted

## 2023-04-13 NOTE — Telephone Encounter (Signed)
Attempted to contact the patient and left message to advise patient to call the office to schedule an appointment. Patient advised we received FMLA paperwork in our office for her and we will need for her to have an appointment to discuss prior to completing this paperwork.

## 2023-04-14 ENCOUNTER — Ambulatory Visit: Payer: Commercial Managed Care - PPO | Admitting: Physician Assistant

## 2023-04-16 ENCOUNTER — Ambulatory Visit
Admission: RE | Admit: 2023-04-16 | Discharge: 2023-04-16 | Disposition: A | Discharge status: Home / Self Care | Payer: Commercial Managed Care - PPO | Source: Ambulatory Visit | Attending: Family Medicine | Admitting: Family Medicine

## 2023-04-16 DIAGNOSIS — N644 Mastodynia: Secondary | ICD-10-CM | POA: Diagnosis not present

## 2023-04-16 DIAGNOSIS — N6002 Solitary cyst of left breast: Secondary | ICD-10-CM | POA: Diagnosis not present

## 2023-04-22 ENCOUNTER — Ambulatory Visit: Payer: Commercial Managed Care - PPO | Admitting: Family Medicine

## 2023-04-26 ENCOUNTER — Other Ambulatory Visit (HOSPITAL_COMMUNITY): Payer: Self-pay

## 2023-04-26 ENCOUNTER — Other Ambulatory Visit: Payer: Self-pay

## 2023-05-14 ENCOUNTER — Other Ambulatory Visit (HOSPITAL_COMMUNITY): Payer: Self-pay

## 2023-05-14 MED ORDER — DOXYCYCLINE HYCLATE 20 MG PO TABS
20.0000 mg | ORAL_TABLET | Freq: Two times a day (BID) | ORAL | 5 refills | Status: DC
  Filled 2023-05-14: qty 60, 30d supply, fill #0
  Filled 2023-07-28: qty 60, 30d supply, fill #1
  Filled 2023-08-25: qty 60, 30d supply, fill #2
  Filled 2024-01-13: qty 60, 30d supply, fill #3

## 2023-05-14 MED ORDER — CHLORHEXIDINE GLUCONATE 0.12 % MT SOLN
OROMUCOSAL | 2 refills | Status: DC
  Filled 2023-05-14: qty 473, 15d supply, fill #0
  Filled 2023-07-28: qty 473, 15d supply, fill #1
  Filled 2023-08-25: qty 473, 15d supply, fill #2

## 2023-05-17 ENCOUNTER — Other Ambulatory Visit (HOSPITAL_COMMUNITY): Payer: Self-pay

## 2023-05-24 ENCOUNTER — Ambulatory Visit: Payer: Commercial Managed Care - PPO | Admitting: Family Medicine

## 2023-05-24 ENCOUNTER — Telehealth: Payer: Self-pay | Admitting: Family Medicine

## 2023-05-24 NOTE — Telephone Encounter (Signed)
Caller name: GOLDIA LIGMAN  On DPR?: Yes  Call back number: [phone] (home)  Provider they see: Sheliah Hatch, MD  Reason for call:  Pt isn't a no show I called after hours and cancelled appt early this morning tested Covid +. Pt has been rescheduled.  [facility] [place] Night - C Client Site [facility] - Night Provider Lezlie Octave- MD Contact Type Call Who Is Calling Patient / Member / Family / Caregiver Caller Name Falisa Lamora Caller Phone Number [phone] Patient Name Vanessa Reeves Patient DOB [birth year] Call Type Message Only Information Provided Reason for Call Request to [facility] Appointment Initial Comment Caller wants to cancel her appt due to covid. Patient request to speak to RN No Additional Comment provided hours Disp. Time Disposition Final User 05/24/2023 7:46:38 AM General Information Provided Yes Imagene Riches Call Closed By: Imagene Riches Transaction Date/Time: 05/24/2023 7:44:52 AM (ET

## 2023-05-25 ENCOUNTER — Telehealth: Payer: Self-pay | Admitting: Family Medicine

## 2023-05-25 NOTE — Telephone Encounter (Signed)
[facility] [place] Night - C Client Site [facility] - Night Provider Lezlie Octave- MD Contact Type Call Who Is Calling Patient / Member / Family / Caregiver Caller Name [name] Caller Phone Number [phone] Patient Name Vanessa Reeves Patient DOB 1971/01/03 Call Type Message Only Information Provided Reason for Call Request to [facility] Appointment Initial Comment Caller wants to cancel her appt due to covid. Patient request to speak to RN No Additional Comment provided hours Disp. Time Disposition Final User 05/24/2023 7:46:38 AM General Information Provided Yes Imagene Riches Call Closed By: Imagene Riches Transaction Date/Time: 05/24/2023 7:44:52 AM (ET

## 2023-05-31 ENCOUNTER — Ambulatory Visit: Payer: Commercial Managed Care - PPO | Admitting: Family Medicine

## 2023-05-31 ENCOUNTER — Other Ambulatory Visit (HOSPITAL_COMMUNITY): Payer: Self-pay

## 2023-05-31 ENCOUNTER — Other Ambulatory Visit: Payer: Self-pay

## 2023-05-31 VITALS — BP 130/80 | HR 67 | Temp 98.1°F | Resp 19 | Ht 65.0 in | Wt 340.2 lb

## 2023-05-31 DIAGNOSIS — E1142 Type 2 diabetes mellitus with diabetic polyneuropathy: Secondary | ICD-10-CM

## 2023-05-31 DIAGNOSIS — Z7984 Long term (current) use of oral hypoglycemic drugs: Secondary | ICD-10-CM

## 2023-05-31 DIAGNOSIS — M359 Systemic involvement of connective tissue, unspecified: Secondary | ICD-10-CM

## 2023-05-31 DIAGNOSIS — R079 Chest pain, unspecified: Secondary | ICD-10-CM

## 2023-05-31 DIAGNOSIS — M5136 Other intervertebral disc degeneration, lumbar region: Secondary | ICD-10-CM | POA: Diagnosis not present

## 2023-05-31 LAB — BASIC METABOLIC PANEL
BUN: 17 mg/dL (ref 6–23)
CO2: 31 mEq/L (ref 19–32)
Calcium: 9.5 mg/dL (ref 8.4–10.5)
Chloride: 98 mEq/L (ref 96–112)
Creatinine, Ser: 0.91 mg/dL (ref 0.40–1.20)
GFR: 73.51 mL/min (ref >60.00)
Glucose, Bld: 159 mg/dL — ABNORMAL HIGH (ref 70–99)
Potassium: 3.2 mEq/L — ABNORMAL LOW (ref 3.5–5.1)
Sodium: 137 mEq/L (ref 135–145)

## 2023-05-31 LAB — MICROALBUMIN / CREATININE URINE RATIO
Creatinine,U: 182.1 mg/dL
Microalb Creat Ratio: 0.5 mg/g (ref 0.0–30.0)
Microalb, Ur: 0.8 mg/dL (ref 0.0–1.9)

## 2023-05-31 LAB — HEMOGLOBIN A1C: Hgb A1c MFr Bld: 7 % — ABNORMAL HIGH (ref 4.6–6.5)

## 2023-05-31 LAB — SEDIMENTATION RATE: Sed Rate: 103 mm/hr — ABNORMAL HIGH (ref 0–30)

## 2023-05-31 MED ORDER — ALPRAZOLAM 0.5 MG PO TABS
0.5000 mg | ORAL_TABLET | Freq: Two times a day (BID) | ORAL | 1 refills | Status: DC | PRN
  Filled 2023-05-31: qty 30, 15d supply, fill #0
  Filled 2023-10-08: qty 30, 15d supply, fill #1

## 2023-05-31 MED ORDER — LOSARTAN POTASSIUM 50 MG PO TABS
100.0000 mg | ORAL_TABLET | Freq: Every day | ORAL | 1 refills | Status: DC
  Filled 2023-05-31: qty 180, 90d supply, fill #0
  Filled 2023-06-01 – 2023-09-29 (×2): qty 180, 90d supply, fill #1

## 2023-05-31 MED ORDER — HYDROCHLOROTHIAZIDE 25 MG PO TABS
25.0000 mg | ORAL_TABLET | Freq: Every day | ORAL | 0 refills | Status: DC
  Filled 2023-05-31: qty 90, 90d supply, fill #0

## 2023-05-31 MED ORDER — LEVOCETIRIZINE DIHYDROCHLORIDE 5 MG PO TABS
5.0000 mg | ORAL_TABLET | Freq: Every day | ORAL | 1 refills | Status: DC
  Filled 2023-05-31 – 2024-01-13 (×2): qty 90, 90d supply, fill #0
  Filled 2024-02-08 – 2024-05-22 (×3): qty 90, 90d supply, fill #1

## 2023-05-31 MED ORDER — CYCLOBENZAPRINE HCL 10 MG PO TABS
10.0000 mg | ORAL_TABLET | Freq: Every day | ORAL | 0 refills | Status: DC
Start: 2023-05-31 — End: 2023-08-11
  Filled 2023-05-31: qty 30, 30d supply, fill #0

## 2023-05-31 MED ORDER — TRAMADOL-ACETAMINOPHEN 37.5-325 MG PO TABS
1.0000 | ORAL_TABLET | Freq: Four times a day (QID) | ORAL | 0 refills | Status: DC | PRN
  Filled 2023-05-31: qty 30, 8d supply, fill #0

## 2023-05-31 MED ORDER — ESCITALOPRAM OXALATE 20 MG PO TABS
20.0000 mg | ORAL_TABLET | Freq: Every day | ORAL | 1 refills | Status: DC
  Filled 2023-05-31 – 2023-10-28 (×2): qty 90, 90d supply, fill #0
  Filled 2024-01-13: qty 90, 90d supply, fill #1

## 2023-05-31 MED ORDER — METFORMIN HCL ER 500 MG PO TB24
500.0000 mg | ORAL_TABLET | Freq: Two times a day (BID) | ORAL | 1 refills | Status: DC
  Filled 2023-05-31: qty 180, 90d supply, fill #0
  Filled 2023-10-04: qty 180, 90d supply, fill #1

## 2023-05-31 NOTE — Patient Instructions (Signed)
Follow up in 3-4 months to recheck BP, cholesterol, and sugar We'll notify you of your lab results and make any changes if needed Continue to work on low carb, low sugar diet and regular physical activity- you can do it! We'll call you to set up your cardiology appt.  If you have worsening or severe chest pain- go to the ER Call with any questions or concerns Hang in there!!

## 2023-05-31 NOTE — Assessment & Plan Note (Signed)
New.  Pt feels strongly that this is stress related but she has multiple risk factors for CAD- DM, HTN, hyperlipidemia, obesity.  Will refer to cards for complete evaluation rather than just assume this is stress related.  Pt expressed understanding and is in agreement w/ plan.

## 2023-05-31 NOTE — Assessment & Plan Note (Signed)
Chronic problem.  Currently on Metformin XR 500mg  BID.  Also on ARB and statin.  UTD on foot exam, eye exam.  Will get microalbumin as that is due in Sept.  Given her ongoing issue w/ weight, if we need additional glucose control, a GLP1 would be a great option.  Check labs.  Adjust meds prn

## 2023-05-31 NOTE — Progress Notes (Signed)
   Subjective:    Patient ID: Vanessa Reeves, female    DOB: 1971-05-07, 52 y.o.   MRN: [id number]  HPI DM- chronic problem, on Metformin XR 500mg  BID.  On ARB and statin.  UTD on foot exam, eye exam.  Microalbumin coming due.  + CP- pt feels strongly this is stress related.  + SOB- she feels this is due to inactivity.  No visual changes.  No abd pain, N/V.  No change in neuropathy.  Sugars have been up and down as she has lost both parents within the last year and has a strained relationship w/ her adult children.   Review of Systems For ROS see HPI     Objective:   Physical Exam Vitals reviewed.  Constitutional:      General: She is not in acute distress.    Appearance: Normal appearance. She is well-developed. She is obese. She is not ill-appearing.  HENT:     Head: Normocephalic and atraumatic.  Eyes:     Conjunctiva/sclera: Conjunctivae normal.     Pupils: Pupils are equal, round, and reactive to light.  Neck:     Thyroid: No thyromegaly.  Cardiovascular:     Rate and Rhythm: Normal rate and regular rhythm.     Heart sounds: Normal heart sounds. No murmur heard. Pulmonary:     Effort: Pulmonary effort is normal. No respiratory distress.     Breath sounds: Normal breath sounds.  Abdominal:     General: There is no distension.     Palpations: Abdomen is soft.     Tenderness: There is no abdominal tenderness.  Musculoskeletal:     Cervical back: Normal range of motion and neck supple.  Lymphadenopathy:     Cervical: No cervical adenopathy.  Skin:    General: Skin is warm and dry.  Neurological:     General: No focal deficit present.     Mental Status: She is alert and oriented to person, place, and time.  Psychiatric:        Mood and Affect: Mood normal.        Behavior: Behavior normal.        Thought Content: Thought content normal.           Assessment & Plan:

## 2023-06-01 ENCOUNTER — Other Ambulatory Visit: Payer: Self-pay

## 2023-06-01 ENCOUNTER — Other Ambulatory Visit: Payer: Self-pay | Admitting: Family Medicine

## 2023-06-01 ENCOUNTER — Telehealth: Payer: Self-pay

## 2023-06-01 DIAGNOSIS — E876 Hypokalemia: Secondary | ICD-10-CM

## 2023-06-01 DIAGNOSIS — M5136 Other intervertebral disc degeneration, lumbar region: Secondary | ICD-10-CM

## 2023-06-01 MED ORDER — POTASSIUM CHLORIDE CRYS ER 20 MEQ PO TBCR
20.0000 meq | EXTENDED_RELEASE_TABLET | Freq: Every day | ORAL | 1 refills | Status: DC
Start: 2023-06-01 — End: 2024-01-24
  Filled 2023-06-01: qty 90, 90d supply, fill #0
  Filled 2024-01-13: qty 90, 90d supply, fill #1

## 2023-06-01 NOTE — Telephone Encounter (Signed)
Left pt a VM to call and schedule a lab only visit for 2 weeks to repeat BMP order is in . Kdur 20 meq has been sent in as well. She is aware of results seen in My chart

## 2023-06-01 NOTE — Telephone Encounter (Signed)
-----   Message from Neena Rhymes sent at 06/01/2023  7:19 AM EDT ----- A1C is stable- great news!  Potassium is low.  Based on this, we're going to start Kdur daily (#90, 1 refill) and repeat your BMP at a lab only visit in 2 weeks (dx hypokalemia)  Waiting on remainder of rheumatology labs

## 2023-06-02 NOTE — Telephone Encounter (Signed)
Called LM to call back and make follow up lab appt

## 2023-06-07 ENCOUNTER — Telehealth: Payer: Self-pay | Admitting: Family Medicine

## 2023-06-07 NOTE — Telephone Encounter (Signed)
Received form from [employer] & placed in provider bin

## 2023-06-07 NOTE — Telephone Encounter (Signed)
I have the forms

## 2023-06-07 NOTE — Telephone Encounter (Signed)
Dr Beverely Low has the form

## 2023-06-13 ENCOUNTER — Encounter: Payer: Self-pay | Admitting: Cardiovascular Disease

## 2023-06-13 NOTE — Progress Notes (Unsigned)
  Cardiology Office Note:  .   Date:  06/14/2023  ID:  Vanessa Reeves, DOB 1971/07/09, MRN [id number] PCP: Sheliah Hatch, MD  [facility] Providers Cardiologist:    }   History of Present Illness: Vanessa Reeves   ROMIYAH ELK is a 52 y.o. female wit chest pain , morbid obesity  CP , on and off Like someone sitting on her chest  Last several seconds   Might have intermittant pains for 1 second  Has been under lots of stress   Father died of CHF this past 03-03-23   Just started exercising  Has lost 10 lbs over past 1 month No CP with exercise  Has asthma   Husband says she has started snoring loudly recently    ROS:   Studies Reviewed: Vanessa Reeves     ECG:   EKG Interpretation Date/Time:  Monday June 14 2023 10:06:26 EDT Ventricular Rate:  76 PR Interval:  172 QRS Duration:  88 QT Interval:  380 QTC Calculation: 427 R Axis:   -20  Text Interpretation: Normal sinus rhythm Moderate voltage criteria for LVH, may be normal variant ( R in aVL , Cornell product ) When compared with ECG of 03-Aug-2019 21:48, No significant change was found Confirmed by Kristeen Miss ([id number]) on 06/14/2023 10:16:45 AM   Risk Assessment/Calculations:             Physical Exam:   VS:  BP 128/70   Pulse 76   Ht 5\' 5"  (1.651 m)   Wt (!) 335 lb (152 kg)   LMP 12/04/2011   SpO2 96%   BMI 55.75 kg/m    Wt Readings from Last 3 Encounters:  06/14/23 (!) 335 lb (152 kg)  05/31/23 (!) 340 lb 4 oz (154.3 kg)  03/23/23 (!) 339 lb 6.4 oz (154 kg)    GEN: morbidly obese, middle age female ,  in no acute distress NECK: No JVD; No carotid bruits CARDIAC: RRR, no murmurs, rubs, gallops RESPIRATORY:  Clear to auscultation without rales, wheezing or rhonchi  ABDOMEN: Soft, non-tender, non-distended EXTREMITIES:  No edema; No deformity   ASSESSMENT AND PLAN: .      Chest pain:   Atypical   No c/w cardiac cP   I have encouraged her to continue with her exercise program.  As long she is  not having exercise-induced chest pain and I feel fairly confident that this is noncardiac chest pain.  Will continue to follow her.  I will see her again in 3 months for follow-up visit.  She snores,  I suspect she has OSA .  Will screen for OSA  / itamar    2.  Morbid Obesity :  Body mass index is 55.75 kg/m.  just joined weight watchers.   Ecourage continued diet, exercise  We discussed the [facility] weight loss program           Dispo: 3 months   Signed, Kristeen Miss, MD

## 2023-06-14 ENCOUNTER — Encounter: Payer: Self-pay | Admitting: Cardiovascular Disease

## 2023-06-14 ENCOUNTER — Ambulatory Visit: Payer: Commercial Managed Care - PPO | Attending: Cardiovascular Disease | Admitting: Cardiovascular Disease

## 2023-06-14 ENCOUNTER — Telehealth: Payer: Self-pay | Admitting: *Deleted

## 2023-06-14 VITALS — BP 128/70 | HR 76 | Ht 65.0 in | Wt 335.0 lb

## 2023-06-14 DIAGNOSIS — I10 Essential (primary) hypertension: Secondary | ICD-10-CM | POA: Diagnosis not present

## 2023-06-14 DIAGNOSIS — Z6841 Body Mass Index (BMI) 40.0 and over, adult: Secondary | ICD-10-CM | POA: Diagnosis not present

## 2023-06-14 DIAGNOSIS — E66813 Obesity, class 3: Secondary | ICD-10-CM

## 2023-06-14 DIAGNOSIS — R0683 Snoring: Secondary | ICD-10-CM | POA: Diagnosis not present

## 2023-06-14 DIAGNOSIS — R0789 Other chest pain: Secondary | ICD-10-CM

## 2023-06-14 NOTE — Patient Instructions (Signed)
Medication Instructions:  Your physician recommends that you continue on your current medications as directed. Please refer to the Current Medication list given to you today.  *If you need a refill on your cardiac medications before your next appointment, please call your pharmacy*   Lab Work: NONE If you have labs (blood work) drawn today and your tests are completely normal, you will receive your results only by: MyChart Message (if you have MyChart) OR A paper copy in the mail If you have any lab test that is abnormal or we need to change your treatment, we will call you to review the results.   Testing/Procedures: Coronary Calcium Score CT Non-Cardiac CT scanning, (CAT scanning), is a noninvasive, special x-ray that produces cross-sectional images of the body using x-rays and a computer. CT scans help physicians diagnose and treat medical conditions. For some CT exams, a contrast material is used to enhance visibility in the area of the body being studied. CT scans provide greater clarity and reveal more details than regular x-ray exams.  Itamar Sleep study Your physician has recommended that you have a sleep study. This test records several body functions during sleep, including: brain activity, eye movement, oxygen and carbon dioxide blood levels, heart rate and rhythm, breathing rate and rhythm, the flow of air through your mouth and nose, snoring, body muscle movements, and chest and belly movement.   Follow-Up: At [facility], you and your health needs are our priority.  As part of our continuing mission to provide you with exceptional heart care, we have created designated Provider Care Teams.  These Care Teams include your primary Cardiologist (physician) and Advanced Practice Providers (APPs -  Physician Assistants and Nurse Practitioners) who all work together to provide you with the care you need, when you need it.   Your next appointment:   3 month(s)  Provider:    Kristeen Miss, MD

## 2023-06-14 NOTE — Telephone Encounter (Signed)
Dr. Elease Hashimoto ordered an Itamar sleep study for the pt. Pt agreeable to signed waiver and not to open the box until she has been called with the PIN#.

## 2023-06-23 DIAGNOSIS — Z79899 Other long term (current) drug therapy: Secondary | ICD-10-CM | POA: Diagnosis not present

## 2023-06-27 ENCOUNTER — Other Ambulatory Visit (HOSPITAL_COMMUNITY): Payer: Self-pay

## 2023-06-28 ENCOUNTER — Ambulatory Visit: Payer: Commercial Managed Care - PPO | Admitting: Family Medicine

## 2023-06-28 ENCOUNTER — Encounter: Payer: Self-pay | Admitting: Cardiovascular Disease

## 2023-06-28 ENCOUNTER — Ambulatory Visit (HOSPITAL_COMMUNITY)
Admission: RE | Admit: 2023-06-28 | Discharge: 2023-06-28 | Disposition: A | Discharge status: Home / Self Care | Payer: Self-pay | Source: Ambulatory Visit | Attending: Cardiovascular Disease | Admitting: Cardiovascular Disease

## 2023-06-28 DIAGNOSIS — R0789 Other chest pain: Secondary | ICD-10-CM

## 2023-06-28 DIAGNOSIS — Z6841 Body Mass Index (BMI) 40.0 and over, adult: Secondary | ICD-10-CM

## 2023-06-28 DIAGNOSIS — I10 Essential (primary) hypertension: Secondary | ICD-10-CM

## 2023-06-28 DIAGNOSIS — R0683 Snoring: Secondary | ICD-10-CM

## 2023-06-28 NOTE — Telephone Encounter (Signed)
Prior Authorization for [facility] sent to [place] [employer] via web portal. Tracking Number .  READY -NO PA REQ

## 2023-06-28 NOTE — Telephone Encounter (Signed)
Left detailed message for the pt that her [place] sleep study has been approved. Left message with the PIN# [id number]. Left message if she may do her study one night this week and call back or send MY CHART message to let me know she got my vm.

## 2023-06-29 ENCOUNTER — Other Ambulatory Visit: Payer: Self-pay | Admitting: Family Medicine

## 2023-06-29 ENCOUNTER — Other Ambulatory Visit (INDEPENDENT_AMBULATORY_CARE_PROVIDER_SITE_OTHER): Payer: Commercial Managed Care - PPO

## 2023-06-29 DIAGNOSIS — M359 Systemic involvement of connective tissue, unspecified: Secondary | ICD-10-CM | POA: Diagnosis not present

## 2023-06-29 DIAGNOSIS — E876 Hypokalemia: Secondary | ICD-10-CM

## 2023-06-29 LAB — BASIC METABOLIC PANEL
BUN: 14 mg/dL (ref 6–23)
CO2: 31 mEq/L (ref 19–32)
Calcium: 9.2 mg/dL (ref 8.4–10.5)
Chloride: 99 mEq/L (ref 96–112)
Creatinine, Ser: 0.93 mg/dL (ref 0.40–1.20)
GFR: 71.58 mL/min (ref >60.00)
Glucose, Bld: 145 mg/dL — ABNORMAL HIGH (ref 70–99)
Potassium: 3.9 mEq/L (ref 3.5–5.1)
Sodium: 138 mEq/L (ref 135–145)

## 2023-06-29 NOTE — Progress Notes (Signed)
Labs re-ordered as last specimen was not processed

## 2023-06-29 NOTE — Progress Notes (Signed)
Pt came for labs only, tolerated draw well.   

## 2023-06-30 ENCOUNTER — Telehealth: Payer: Self-pay

## 2023-06-30 LAB — C3 AND C4
C3 Complement: 181 mg/dL (ref 83–193)
C4 Complement: 55 mg/dL (ref 15–57)

## 2023-06-30 NOTE — Telephone Encounter (Signed)
Left results on pt VM  

## 2023-06-30 NOTE — Telephone Encounter (Signed)
-----   Message from Neena Rhymes sent at 06/30/2023  7:10 AM EDT ----- Potassium is now normal- great news!

## 2023-07-02 ENCOUNTER — Telehealth: Payer: Self-pay

## 2023-07-02 DIAGNOSIS — R0789 Other chest pain: Secondary | ICD-10-CM

## 2023-07-02 NOTE — Telephone Encounter (Signed)
-----   Message from Kristeen Miss sent at 07/01/2023  5:48 PM EDT ----- Please repeat study at no charge. Poor quality , unable to interpret

## 2023-07-04 ENCOUNTER — Encounter: Payer: Self-pay | Admitting: Cardiology

## 2023-07-04 DIAGNOSIS — G4733 Obstructive sleep apnea (adult) (pediatric): Secondary | ICD-10-CM | POA: Diagnosis not present

## 2023-07-05 ENCOUNTER — Ambulatory Visit: Payer: Commercial Managed Care - PPO | Attending: Cardiovascular Disease

## 2023-07-05 DIAGNOSIS — E66813 Obesity, class 3: Secondary | ICD-10-CM

## 2023-07-05 DIAGNOSIS — R0683 Snoring: Secondary | ICD-10-CM

## 2023-07-05 DIAGNOSIS — I10 Essential (primary) hypertension: Secondary | ICD-10-CM

## 2023-07-05 DIAGNOSIS — R0789 Other chest pain: Secondary | ICD-10-CM

## 2023-07-05 NOTE — Procedures (Signed)
Patient Information Study Date: 07/04/2023 Patient Name: Vanessa Reeves Patient ID: [id number] Birth Date: Mar 30, 1971 Age: 52 Gender: Female BMI: 55.8 (W=335 lb, H=5' 5'') Stopbang: 5 Referring Physician: Kristeen Miss, MD  TEST DESCRIPTION:  Home sleep apnea testing was completed using the WatchPat, a Type 1 device, utilizing peripheral arterial tonometry (PAT), chest movement, actigraphy, pulse oximetry, pulse rate, body position and snore.  AHI was calculated with apnea and hypopnea using valid sleep time as the denominator. RDI includes apneas, hypopneas, and RERAs.  The data acquired and the scoring of sleep and all associated events were performed in accordance with the recommended standards and specifications as outlined in the AASM Manual for the Scoring of Sleep and Associated Events 2.2.0 (2015).  FINDINGS:  1.  Moderate Obstructive Sleep Apnea with AHI 23.4/hr.   2.  No Central Sleep Apnea with pAHIc 1/hr.  3.  Oxygen desaturations as low as 84%.  4.  Moderate to severe snoring was present. O2 sats were < 88% for 0.9 min.  5.  Total sleep time was 4 hrs and 29 min.  6.  28.5% of total sleep time was spent in REM sleep.   7.  Normal sleep onset latency at 20 min  8.  Shortened REM sleep onset latency at 66 min.   9.  Total awakenings were 6.  10. Arrhythmia detection:  None.  DIAGNOSIS:   Moderate Obstructive Sleep Apnea (G47.33)  RECOMMENDATIONS:   1.  Clinical correlation of these findings is necessary.  The decision to treat obstructive sleep apnea (OSA) is usually based on the presence of apnea symptoms or the presence of associated medical conditions such as Hypertension, Congestive Heart Failure, Atrial Fibrillation or Obesity.  The most common symptoms of OSA are snoring, gasping for breath while sleeping, daytime sleepiness and fatigue.   2.  Initiating apnea therapy is recommended given the presence of symptoms and/or associated conditions. Recommend proceeding  with one of the following:     a.  Auto-CPAP therapy with a pressure range of 5-20cm H2O.     b.  An oral appliance (OA) that can be obtained from certain dentists with expertise in sleep medicine.  These are primarily of use in non-obese patients with mild and moderate disease.     c.  An ENT consultation which may be useful to look for specific causes of obstruction and possible treatment options.     d.  If patient is intolerant to PAP therapy, consider referral to ENT for evaluation for hypoglossal nerve stimulator.   3.  Close follow-up is necessary to ensure success with CPAP or oral appliance therapy for maximum benefit.  4.  A follow-up oximetry study on CPAP is recommended to assess the adequacy of therapy and determine the need for supplemental oxygen or the potential need for Bi-level therapy.  An arterial blood gas to determine the adequacy of baseline ventilation and oxygenation should also be considered.  5.  Healthy sleep recommendations include:  adequate nightly sleep (normal 7-9 hrs/night), avoidance of caffeine after noon and alcohol near bedtime, and maintaining a sleep environment that is cool, dark and quiet.  6.  Weight loss for overweight patients is recommended.  Even modest amounts of weight loss can significantly improve the severity of sleep apnea.  7.  Snoring recommendations include:  weight loss where appropriate, side sleeping, and avoidance of alcohol before bed.  8.  Operation of motor vehicle should not be performed when sleepy.  Signature: Armanda Magic, MD; [facility];  Diplomat, Biomedical engineer of Sleep Medicine Electronically Signed: 07/05/2023 6:59:08 PM

## 2023-07-12 ENCOUNTER — Ambulatory Visit (INDEPENDENT_AMBULATORY_CARE_PROVIDER_SITE_OTHER): Payer: Commercial Managed Care - PPO

## 2023-07-12 DIAGNOSIS — Z23 Encounter for immunization: Secondary | ICD-10-CM

## 2023-07-12 NOTE — Progress Notes (Signed)
Pt came in for her flu vaccine . Tolerated injection well and gave in left deltoid . No concerns at this time per pt

## 2023-07-16 ENCOUNTER — Other Ambulatory Visit (HOSPITAL_COMMUNITY): Payer: Self-pay

## 2023-07-26 ENCOUNTER — Ambulatory Visit (HOSPITAL_COMMUNITY)
Admission: RE | Admit: 2023-07-26 | Discharge: 2023-07-26 | Disposition: A | Discharge status: Home / Self Care | Payer: Commercial Managed Care - PPO | Source: Ambulatory Visit | Attending: Cardiovascular Disease | Admitting: Cardiovascular Disease

## 2023-07-26 ENCOUNTER — Encounter (HOSPITAL_COMMUNITY): Payer: Self-pay

## 2023-07-26 DIAGNOSIS — Z6841 Body Mass Index (BMI) 40.0 and over, adult: Secondary | ICD-10-CM | POA: Insufficient documentation

## 2023-07-26 DIAGNOSIS — R0789 Other chest pain: Secondary | ICD-10-CM | POA: Insufficient documentation

## 2023-07-28 ENCOUNTER — Other Ambulatory Visit: Payer: Self-pay | Admitting: Family Medicine

## 2023-07-28 ENCOUNTER — Other Ambulatory Visit: Payer: Self-pay

## 2023-07-28 ENCOUNTER — Other Ambulatory Visit: Payer: Self-pay | Admitting: Physician Assistant

## 2023-07-28 ENCOUNTER — Other Ambulatory Visit (HOSPITAL_COMMUNITY): Payer: Self-pay

## 2023-07-28 DIAGNOSIS — R110 Nausea: Secondary | ICD-10-CM

## 2023-07-28 MED ORDER — ONDANSETRON HCL 4 MG PO TABS
4.0000 mg | ORAL_TABLET | Freq: Three times a day (TID) | ORAL | 1 refills | Status: DC | PRN
  Filled 2023-07-28: qty 30, 10d supply, fill #0
  Filled 2023-10-04: qty 30, 10d supply, fill #1

## 2023-07-28 MED ORDER — SULFACETAMIDE SODIUM-SULFUR 10-5 % EX LIQD
CUTANEOUS | 3 refills | Status: DC
  Filled 2023-07-28: qty 170, 30d supply, fill #0
  Filled 2023-10-28: qty 170, 30d supply, fill #1
  Filled 2024-01-13: qty 170, 30d supply, fill #2
  Filled 2024-05-08: qty 170, 30d supply, fill #3

## 2023-07-28 MED ORDER — FUROSEMIDE 20 MG PO TABS
ORAL_TABLET | Freq: Every day | ORAL | 1 refills | Status: DC
  Filled 2023-07-28: qty 90, 90d supply, fill #0

## 2023-07-28 MED ORDER — DESONIDE 0.05 % EX CREA
TOPICAL_CREAM | Freq: Two times a day (BID) | CUTANEOUS | 3 refills | Status: DC
  Filled 2023-07-28: qty 30, 30d supply, fill #0
  Filled 2023-10-28: qty 30, 30d supply, fill #1
  Filled 2024-01-13: qty 30, 30d supply, fill #2
  Filled 2024-06-28: qty 30, 30d supply, fill #3

## 2023-07-28 NOTE — Telephone Encounter (Signed)
Last Fill: 03/23/2023  Next Visit: 08/23/2023  Last Visit: 03/23/2023  Current Dose per office note on 03/23/2023: not discussed  Okay to refill Zofran?

## 2023-07-29 ENCOUNTER — Other Ambulatory Visit (HOSPITAL_COMMUNITY): Payer: Self-pay

## 2023-07-29 ENCOUNTER — Other Ambulatory Visit: Payer: Self-pay

## 2023-07-29 ENCOUNTER — Telehealth: Payer: Self-pay

## 2023-07-29 NOTE — Telephone Encounter (Signed)
Left VM with callback number for patient to receive sleep study results and recommendations.

## 2023-07-29 NOTE — Telephone Encounter (Signed)
-----   Message from Armanda Magic sent at 07/05/2023  7:00 PM EDT ----- Please let patient know that they have sleep apnea.  Recommend therapeutic CPAP titration for treatment of patient's sleep disordered breathing.  If unable to perform an in lab titration then initiate ResMed auto CPAP from 4 to 15cm H2O with heated humidity and mask of choice and overnight pulse ox on CPAP.

## 2023-07-30 ENCOUNTER — Other Ambulatory Visit: Payer: Self-pay

## 2023-08-06 ENCOUNTER — Encounter: Payer: Self-pay | Admitting: Family Medicine

## 2023-08-06 ENCOUNTER — Other Ambulatory Visit: Payer: Self-pay

## 2023-08-06 ENCOUNTER — Other Ambulatory Visit (HOSPITAL_COMMUNITY): Payer: Self-pay

## 2023-08-06 DIAGNOSIS — E559 Vitamin D deficiency, unspecified: Secondary | ICD-10-CM

## 2023-08-06 MED ORDER — VITAMIN D (ERGOCALCIFEROL) 1.25 MG (50000 UNIT) PO CAPS
50000.0000 [IU] | ORAL_CAPSULE | ORAL | 0 refills | Status: DC
Start: 2023-08-06 — End: 2024-08-02
  Filled 2023-08-06: qty 12, 84d supply, fill #0

## 2023-08-06 NOTE — Addendum Note (Signed)
Addended by: Sheliah Hatch on: 08/06/2023 10:26 AM   Modules accepted: Orders

## 2023-08-10 NOTE — Progress Notes (Signed)
Office Visit Note  Patient: Vanessa Reeves             Date of Birth: [birth year]           MRN: [id number]             PCP: Sheliah Hatch, MD Referring: Sheliah Hatch, MD Visit Date: 08/23/2023 Occupation: @GUAROCC @  Subjective:  Increased joint pain   History of Present Illness: Vanessa Reeves is a 52 y.o. female with history of autoimmune disease and myofascial pain.  Patient remains on  Plaquenil 200 mg 1 tablet by mouth twice daily.  She is tolerating Plaquenil without any side effects and has not missed any doses recently.  Patient states that she has been experiencing several new and worsening symptoms which have started to concern her.  She has difficulty performing ADLs due to her symptoms.  She has been experiencing increased pain involving the right knee and leg since a fall in July 2024.  She was recently evaluated by her PCP Dr. Beverely Low who prescribed a prednisone taper.  She is on her fourth day of prednisone but has not noticed much improvement in her knee joint pain yet.  She has tried ice and elevating and has continued taking gabapentin and Flexeril.  She states that the pain and stiffness in her hands has improved while taking prednisone.  She continues to experience stiffness in both shoulder joints.  She is leaving for vacation on Wednesday and is concerned by her level of pain.  Patient continues to experience intermittent sores in her mouth as well as Raynaud's phenomenon and facial rashes intermittently.  She does not feel that Plaquenil has been as effective as it was at managing her symptoms recently and is concerned her symptoms are progressing.  She has requested a handicap placard to use on days she has difficulty ambulating prolonged distances.      Activities of Daily Living:  Patient reports morning stiffness for 10 minutes.   Patient Reports nocturnal pain.  Difficulty dressing/grooming: Reports Difficulty climbing stairs: Reports Difficulty  getting out of chair: Reports Difficulty using hands for taps, buttons, cutlery, and/or writing: Reports  Review of Systems  Constitutional:  Positive for fatigue.  HENT:  Positive for mouth sores and mouth dryness. Negative for nose dryness.   Eyes:  Positive for dryness. Negative for pain and visual disturbance.  Respiratory:  Negative for cough, hemoptysis, shortness of breath and difficulty breathing.   Cardiovascular:  Negative for chest pain, palpitations, hypertension and swelling in legs/feet.  Gastrointestinal:  Positive for constipation. Negative for blood in stool and diarrhea.  Endocrine: Positive for increased urination.  Genitourinary:  Negative for painful urination and involuntary urination.  Musculoskeletal:  Positive for joint pain, gait problem, joint pain, joint swelling, myalgias, muscle weakness, morning stiffness, muscle tenderness and myalgias.  Skin:  Positive for rash, hair loss and sensitivity to sunlight. Negative for color change, pallor, nodules/bumps, skin tightness and ulcers.  Allergic/Immunologic: Negative for susceptible to infections.  Neurological:  Positive for dizziness and headaches. Negative for numbness and weakness.  Hematological:  Negative for swollen glands.  Psychiatric/Behavioral:  Positive for depressed mood and sleep disturbance. The patient is nervous/anxious.     PMFS History:  Patient Active Problem List   Diagnosis Date Noted  . Autoimmune disease (HCC) 08/19/2023  . Polyp of transverse colon   . Polyp of sigmoid colon   . Polyp of rectum   . Adjustment disorder with depressed mood 10/24/2020  .  Hearing loss of right ear 09/04/2020  . Hyperlipidemia associated with type 2 diabetes mellitus (HCC) 02/21/2020  . Insomnia 02/21/2020  . Vitamin D deficiency 02/21/2020  . Degenerative disc disease, lumbar 02/21/2020  . DM (diabetes mellitus) type II controlled, neurological manifestation (HCC) 06/14/2019  . Special screening for  malignant neoplasms, colon 04/11/2018  . Memory changes 11/23/2017  . RLS (restless legs syndrome) 03/17/2017  . 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 Reactive airway disease 08/21/2016  . Gastroesophageal reflux disease without esophagitis 12/20/2015  . Physical exam 12/14/2014  . Breast mass, left 02/16/2014  . Chest pain 12/29/2013  . Depression with anxiety 07/17/2013  . Essential hypertension 07/17/2013  . Eustachian tube dysfunction 07/17/2013  . Adult BMI 50.0-59.9 kg/sq m (HCC) 07/17/2013    Past Medical History:  Diagnosis Date  . Allergy   . Anxiety   . Arthritis    HANDS  . Asthma    HAS MDI  . Complication of anesthesia   . Depression   . Diabetes mellitus without complication (HCC)   . Difficult airway for intubation    2013 used Video Laryngoscope  . GERD (gastroesophageal reflux disease)   . Herpes   . Hyperlipidemia   . Hypertension   . RLS (restless legs syndrome) 03/17/2017   Right leg  . Special screening for malignant neoplasms, colon 04/11/2018    Family History  Problem Relation Age of Onset  . Hypertension Mother   . Clotting disorder Mother   . Stroke Father   . Thrombosis Father   . Congestive Heart Failure Father   . Hypertension Paternal Grandmother   . Diabetes Mellitus II Paternal Grandmother   . Lupus Paternal Grandmother   . Healthy Daughter   . Healthy Son   . Colon cancer Neg Hx   . Esophageal cancer Neg Hx   . Pancreatic cancer Neg Hx   . Stomach cancer Neg Hx   . Liver disease Neg Hx   . Colon polyps Neg Hx    Past Surgical History:  Procedure Laterality Date  . COLONOSCOPY WITH PROPOFOL N/A 07/09/2021   Procedure: COLONOSCOPY WITH PROPOFOL;  Surgeon: Napoleon Form, MD;  Location: [facility];  Service: Endoscopy;  Laterality: N/A;  . DILATION AND CURETTAGE OF UTERUS    . POLYPECTOMY  07/09/2021   Procedure: POLYPECTOMY;  Surgeon: Napoleon Form, MD;  Location: [facility]  ENDOSCOPY;  Service: Endoscopy;;  . TOTAL ABDOMINAL HYSTERECTOMY    . TUBAL LIGATION     Social History   Social History Narrative   Lives with husband and 2 children in a 2 story home.     Works as a Psychologist, sport and exercise at [employer].     Highest level of education:  High school, in college now   Immunization History  Administered Date(s) Administered  . Influenza, Seasonal, Injecte, Preservative Fre 07/12/2023  . Influenza,inj,Quad PF,6+ Mos 07/17/2013, 07/27/2014, 07/26/2015, 08/21/2016, 07/06/2017, 07/29/2018, 07/03/2019, 07/07/2022  . Influenza-Unspecified 08/20/2020, 08/19/2021  . MMR 03/08/2017, 04/05/2017  . Moderna Sars-Covid-2 Vaccination 06/06/2020, 07/04/2020  . PPD Test 07/26/2015, 03/07/2018, 05/23/2018  . Pneumococcal Polysaccharide-23 01/16/2015  . Tdap 10/14/2015     Objective: Vital Signs: BP (!) 163/109 (BP Location: Right Wrist, Patient Position: Sitting, Cuff Size: Normal)   Pulse 80   Resp 16   Ht 5\' 5"  (1.651 m)   Wt (!) 343 lb (155.6 kg)   LMP 12/04/2011   BMI 57.08 kg/m    Physical Exam Vitals and nursing  note reviewed.  Constitutional:      Appearance: She is well-developed.  HENT:     Head: Normocephalic and atraumatic.  Eyes:     Conjunctiva/sclera: Conjunctivae normal.  Cardiovascular:     Rate and Rhythm: Normal rate and regular rhythm.     Heart sounds: Normal heart sounds.  Pulmonary:     Effort: Pulmonary effort is normal.     Breath sounds: Normal breath sounds.  Abdominal:     General: Bowel sounds are normal.     Palpations: Abdomen is soft.  Musculoskeletal:     Cervical back: Normal range of motion.  Lymphadenopathy:     Cervical: No cervical adenopathy.  Skin:    General: Skin is warm and dry.     Capillary Refill: Capillary refill takes less than 2 seconds.  Neurological:     Mental Status: She is alert and oriented to person, place, and time.  Psychiatric:        Behavior: Behavior normal.     Musculoskeletal Exam:  C-spine, thoracic spine, and lumbar spine good ROM.  Shoulder joints, elbow joints, wrist joints, MCPs, PIPs, and DIPs good ROM with no synovitis.  Complete fist formation bilaterally. Hip joints have good ROM with no groin pain.  Painful ROM of the right knee with limited extension.  Left knee has good ROM with no warmth or effusion.  Ankle joints have good ROM with no tenderness or joint swelling.   CDAI Exam: CDAI Score: -- Patient Global: --; Provider Global: -- Swollen: --; Tender: -- Joint Exam 08/23/2023   No joint exam has been documented for this visit   There is currently no information documented on the homunculus. Go to the Rheumatology activity and complete the homunculus joint exam.  Investigation: No additional findings.  Imaging: CT CARDIAC SCORING (SELF PAY ONLY)  Addendum Date: 08/13/2023   ADDENDUM REPORT: 08/13/2023 08:51 EXAM: OVER-READ INTERPRETATION  CT CHEST The following report is an over-read performed by radiologist Dr. Royal Piedra [facility] on 08/13/2023. This over-read does not include interpretation of cardiac or coronary anatomy or pathology. The coronary calcium score interpretation by the cardiologist is attached. COMPARISON:  None. FINDINGS: Within the visualized portions of the thorax there are no suspicious appearing pulmonary nodules or masses, there is no acute consolidative airspace disease, no pleural effusions, no pneumothorax and no lymphadenopathy. Visualized portions of the upper abdomen are unremarkable. There are no aggressive appearing lytic or blastic lesions noted in the visualized portions of the skeleton. IMPRESSION: 1. No significant incidental noncardiac findings are noted. Electronically Signed   By: Trudie Reed M.D.   On: 08/13/2023 08:51   Result Date: 08/13/2023 CLINICAL DATA:  Cardiovascular Disease Risk stratification EXAM: Coronary Calcium Score TECHNIQUE: A gated, non-contrast computed tomography scan of the  heart was performed using 3 mm slice thickness. Axial images were analyzed on a dedicated workstation. Calcium scoring of the coronary arteries was performed using the Agatston method. FINDINGS: Coronary arteries: Normal origins. Coronary Calcium Score: Left main: Left anterior descending artery: Left circumflex artery: Right coronary artery: Total: Percentile: Pericardium: Normal. Aorta: Normal caliber. There is mild aortic valve calcification. Non-cardiac: See separate report from [facility]. IMPRESSION: 1. Significant noise artifact, seen in a recent study. While this can be filtered by adjusting the HU threshold, I cannot reliably exclude coronary calcification. 2. There may be mild aortic valve calcification. 3. Overall inconclusive study. Suspect similar artifacts would be present with coronary CT angiography. RECOMMENDATIONS: Coronary artery calcium (CAC)  score is a strong predictor of incident coronary heart disease (CHD) and provides predictive information beyond traditional risk factors. CAC scoring is reasonable to use in the decision to withhold, postpone, or initiate statin therapy in intermediate-risk or selected borderline-risk asymptomatic adults (age 102-75 years and LDL-C >=70 to <190 mg/dL) who do not have diabetes or established atherosclerotic cardiovascular disease (ASCVD).* In intermediate-risk (10-year ASCVD risk >=7.5% to <20%) adults or selected borderline-risk (10-year ASCVD risk >=5% to <7.5%) adults in whom a CAC score is measured for the purpose of making a treatment decision the following recommendations have been made: If CAC=0, it is reasonable to withhold statin therapy and reassess in 5 to 10 years, as long as higher risk conditions are absent (diabetes mellitus, family history of premature CHD in first degree relatives (males <55 years; females <65 years), cigarette smoking, or LDL >=190 mg/dL). If CAC is 1 to 99, it is reasonable to initiate statin therapy for patients  >=32 years of age. If CAC is >=100 or >=75th percentile, it is reasonable to initiate statin therapy at any age. Cardiology referral should be considered for patients with CAC scores >=400 or >=75th percentile. *2018 AHA/ACC/AACVPR/AAPA/ABC/ACPM/ADA/AGS/APhA/ASPC/NLA/PCNA Guideline on the Management of Blood Cholesterol: A Report of the American College of Cardiology/American Heart Association Task Force on Clinical Practice Guidelines. J Am Coll Cardiol. 2019;73(24):3168-3209. Zoila Shutter, MD Electronically Signed: By: Chrystie Nose M.D. On: 07/26/2023 13:02    Recent Labs: Lab Results  Component Value Date   WBC 7.5 01/20/2023   HGB 12.3 01/20/2023   PLT 402.0 (H) 01/20/2023   NA 138 06/29/2023   K 3.9 06/29/2023   CL 99 06/29/2023   CO2 31 06/29/2023   GLUCOSE 145 (H) 06/29/2023   BUN 14 06/29/2023   CREATININE 0.93 06/29/2023   BILITOT 0.3 01/20/2023   ALKPHOS 89 01/20/2023   AST 12 01/20/2023   ALT 11 01/20/2023   PROT 7.6 01/20/2023   ALBUMIN 3.9 01/20/2023   CALCIUM 9.2 06/29/2023   GFRAA 74 12/16/2020   QFTBGOLDPLUS NEGATIVE 07/03/2019    Speciality Comments: PLQ Eye Exam: 04/02/2022 WNL  @ [facility] Follow up 1 year PLQ Eye Exam scheduled for June 08, 2023  Procedures:  No procedures performed Allergies: Penicillins      Assessment / Plan:     Visit Diagnoses: Autoimmune disease (HCC) - AVISE+ANA, +APS IgM positive: -  Patient presents today with several new concerns.  She continues to experience recurrent oral ulcers, facial rashes, Raynaud's phenomenon, and increased joint pain and inflammation.  She has been taking Plaquenil 200 mg 1 tablet by mouth twice daily but has had persistent symptoms.  She feels that her symptoms have progressively been worsening over the past 1 year.  She has had increased difficulty performing ADLs as well as difficulty with some of her job responsibilities especially due to her joint pain and inflammation.  She presents today  with right knee joint pain.  She has limited extension on examination today.  She also continues to have ongoing discomfort in both shoulders and both hands.  She is taking a prednisone taper prescribed by her PCP Dr. Beverely Low which has helped to alleviate the inflammation in her hands but the pain in her right knee has persisted.  X-rays of the right knee were obtained today for further evaluation. Different treatment options were discussed today in regards to the symptoms she has been experiencing.  She had an ultrasound of both hands on 12/23/2022 which revealed mild synovitis  in the right second MCP joint. Plan to initiate a trial of Imuran 50 mg 1 tablet by mouth daily pending lab results.  Indications, contraindications, potential side effects of Imuran were discussed today in detail.  All questions were addressed and consent was obtained.  The following lab work will be obtained today prior to initiating Imuran.  She will require updated CBC and CMP 2 weeks after initiating Imuran.  A prednisone taper starting at 20 mg tapering by 5 mg every 4 days will be sent to the pharmacy which she can take with her on vacation.  She will remain on plaquenil as prescribed.   She will follow up in 8 weeks or sooner if needed.  Plan: Protein / creatinine ratio, urine, ANA, Thiopurine methyltransferase(tpmt)rbc, Sedimentation rate, C-reactive protein, Anti-DNA antibody, double-stranded, COMPLETE METABOLIC PANEL WITH GFR, CBC with Differential/Platelet, predniSONE (DELTASONE) 5 MG tablet, Hepatitis B core antibody, IgM, Hepatitis B surface antigen, QuantiFERON-TB Gold Plus, Serum protein electrophoresis with reflex, IgG, IgA, IgM  Baseline Immunosuppressant Therapy Labs TB GOLD    Latest Ref Rng & Units 07/03/2019    2:02 PM  Quantiferon TB Gold  Quantiferon TB Gold Plus NEGATIVE NEGATIVE    Hepatitis Panel    Latest Ref Rng & Units 09/04/2020    9:45 AM  Hepatitis  Hep C Ab NON-REACTI NON-REACTIVE     HIV Lab Results  Component Value Date   HIV NON-REACTIVE 12/21/2018   HIV NONREACTIVE 06/07/2015   SPEP    Latest Ref Rng & Units 01/20/2023    2:10 PM  Serum Protein Electrophoresis  Total Protein 6.0 - 8.3 g/dL 7.6    TPMT: Pending  Chest Xray: no active cardiopulmonary disease on 08/03/19.    Patient was counseled on the purpose, proper use, and adverse effects of azathioprine including risk of infection, nausea, rash, and hair loss.  Also informed that medication can cause discoloration of urine, sweat and tears. Reviewed risk of cancer after long term use.  Discussed risk of myelosupression and reviewed importance of frequent lab work to monitor blood counts.  Standing orders placed. Reviewed drug-drug interactions including contraindication with allopurinol.  Provided patient with educational materials on azathioprine and answered all questions.  Patient consented to azathioprine.  Will upload consent into the media tab.   Patient dose will be 50 mg by mouth daily.  Prescription will be sent to pharmacy pending lab results and insurance approval.  High risk medication use - Plaquenil 200 mg 1 tablet by mouth twice daily.  Plan to add Imuran 50 mg 1 tablet daily pending lab results. TPMT will be obtained today.  The following lab work will be updated prior to sending in imuran to the pharmacy.  PLQ Eye Exam: 04/02/2022 WNL @ [facility]. Overdue to update plaquenil eye examination.  BMP updated on 06/29/23. Future orders for CBC and CMP placed today. She will require updated lab work in 2 weeks after initiating imuran. Standing orders for CBC and CMP placed today.    - Plan: Thiopurine methyltransferase(tpmt)rbc, COMPLETE METABOLIC PANEL WITH GFR, CBC with Differential/Platelet, predniSONE (DELTASONE) 5 MG tablet, Hepatitis B core antibody, IgM, Hepatitis B surface antigen, QuantiFERON-TB Gold Plus, Serum protein electrophoresis with reflex, IgG, IgA, IgM  Chronic pain of right  knee - Patient presents with ongoing pain involving the right knee.  She had a fall in July 2024 and has had intermittent pain since then. She has noticed instability at times and difficulty ambulating prolonged distances. She has been  taking gabapentin and flexeril which has alleviated the muscle tension she was experiencing in her quad but continues to have joint pain.  She was evaluated by Dr. Beverely Low and has been taking a prednisone taper as prescribed-currently on day 4.  She has painful extension of the right knee on examination today.   X-rays of the right knee updated today.  An extended prednisone taper was provided to the patient today to alleviate her symptoms while on vacation.  Plan to add on imuran as combination therapy as discussed above.  Paperwork for a temporary handicap placard was provided.  If her mechanical symptoms persist or worsen she was encouraged to follow up at [facility] for further evaluation.   Plan: XR KNEE 3 VIEW RIGHT  Screening for tuberculosis - Future order for TB gold placed today. Plan: QuantiFERON-TB Gold Plus  Bilateral hand pain - Ultrasound on 12/23/2022 showed mild synovitis in the right second MCP joint. Responsive to prednisone.  She continues to have pain and stiffness involving both hands despite taking plaquenil as prescribed. Stiffness has improved while taking current prednisone taper prescribed by Dr. Beverely Low.  Plan to initiate a trial of Imuran.   Myofascial pain: She takes gabapentin and flexeril as prescribed.   Other fatigue: Chronic   Family history of blood clots - Beta-2 glycoprotein antibodies, anticardiolipin antibodies, and lupus anticoagulant negative on 09/16/2021.  Chronic SI joint pain: Chronic pain.   Facet arthropathy - Lumbar X-rays updated on 02/25/22: show grade one anterolisthesis L4-5 2-3 mm no change from 07/2020. No acute changes. Chronic pain.   Other medical conditions are listed as follows:   Diabetic polyneuropathy  associated with diabetes mellitus due to underlying condition (HCC)  Moderate persistent reactive airway disease with acute exacerbation  Vitamin D deficiency: She is taking vitamin D 50,000 units once weekly.   Essential hypertension: BP was significantly elevated today in the office-patient was advised to monitor blood pressure closely and to reach out to her PCP if her blood pressure remains elevated.   Gastroesophageal reflux disease without esophagitis  RLS (restless legs syndrome)  Former smoker  ADHD (attention deficit hyperactivity disorder), inattentive type  Depression with anxiety   Orders: Orders Placed This Encounter  Procedures  . XR KNEE 3 VIEW RIGHT  . Protein / creatinine ratio, urine  . ANA  . Thiopurine methyltransferase(tpmt)rbc  . Sedimentation rate  . C-reactive protein  . Anti-DNA antibody, double-stranded  . COMPLETE METABOLIC PANEL WITH GFR  . CBC with Differential/Platelet  . Hepatitis B core antibody, IgM  . Hepatitis B surface antigen  . QuantiFERON-TB Gold Plus  . Serum protein electrophoresis with reflex  . IgG, IgA, IgM  . CBC with Differential/Platelet  . COMPLETE METABOLIC PANEL WITH GFR   Meds ordered this encounter  Medications  . predniSONE (DELTASONE) 5 MG tablet    Sig: Take 4 tablets (20 mg total) by mouth daily for 4 days, THEN 3 tablets (15 mg total) daily for 4 days, THEN 2 tablets (10 mg total) daily for 4 days, THEN 1 tablet (5 mg total) daily for 4 days.    Dispense:  40 tablet    Refill:  0     Follow-Up Instructions: Return in about 8 weeks (around 10/18/2023) for Autoimmune Disease.   Gearldine Bienenstock, PA-C  Note - This record has been created using Dragon software.  Chart creation errors have been sought, but may not always  have been located. Such creation errors do not reflect on  the  standard of medical care.

## 2023-08-11 ENCOUNTER — Other Ambulatory Visit: Payer: Self-pay

## 2023-08-11 ENCOUNTER — Other Ambulatory Visit: Payer: Self-pay | Admitting: Family Medicine

## 2023-08-11 ENCOUNTER — Other Ambulatory Visit (HOSPITAL_COMMUNITY): Payer: Self-pay

## 2023-08-11 DIAGNOSIS — M51369 Other intervertebral disc degeneration, lumbar region without mention of lumbar back pain or lower extremity pain: Secondary | ICD-10-CM

## 2023-08-11 MED ORDER — CYCLOBENZAPRINE HCL 10 MG PO TABS
10.0000 mg | ORAL_TABLET | Freq: Every day | ORAL | 0 refills | Status: DC
  Filled 2023-08-11: qty 30, 30d supply, fill #0

## 2023-08-11 MED ORDER — TRAMADOL-ACETAMINOPHEN 37.5-325 MG PO TABS
1.0000 | ORAL_TABLET | Freq: Four times a day (QID) | ORAL | 0 refills | Status: DC | PRN
  Filled 2023-08-11: qty 30, 8d supply, fill #0

## 2023-08-11 MED ORDER — TRAMADOL-ACETAMINOPHEN 37.5-325 MG PO TABS: 1.0000 | ORAL_TABLET | Freq: Four times a day (QID) | ORAL | 0 refills | Status: DC | PRN

## 2023-08-11 NOTE — Addendum Note (Signed)
Addended by: Sheliah Hatch on: 08/11/2023 10:55 AM   Modules accepted: Orders

## 2023-08-11 NOTE — Telephone Encounter (Signed)
Prescription sent to pharmacy.

## 2023-08-11 NOTE — Telephone Encounter (Signed)
Patient is requesting a refill of the following medications: Requested Prescriptions   Signed Prescriptions Disp Refills   cyclobenzaprine (FLEXERIL) 10 MG tablet 30 tablet 0    Sig: Take 1 tablet (10 mg total) by mouth at bedtime.    Authorizing Provider: Sheliah Hatch    Ordering User: Eldred Manges   traMADol-acetaminophen (ULTRACET) 37.5-325 MG tablet 30 tablet 0    Sig: Take 1 tablet by mouth every 6 (six) hours as needed.    Authorizing Provider: Sheliah Hatch    Ordering User: Eldred Manges    Date of patient request: 08/11/23 Last office visit: 05/31/23 Date of last refill: 05/31/23 Last refill amount: 30 Follow up time period per chart: 4 months   Sent flexeril byt tramadol would only print.

## 2023-08-12 ENCOUNTER — Other Ambulatory Visit (HOSPITAL_COMMUNITY): Payer: Self-pay

## 2023-08-13 ENCOUNTER — Other Ambulatory Visit (HOSPITAL_COMMUNITY): Payer: Self-pay

## 2023-08-13 ENCOUNTER — Encounter: Payer: Self-pay | Admitting: Cardiovascular Disease

## 2023-08-17 ENCOUNTER — Other Ambulatory Visit (HOSPITAL_COMMUNITY): Payer: Self-pay

## 2023-08-19 ENCOUNTER — Other Ambulatory Visit: Payer: Self-pay

## 2023-08-19 ENCOUNTER — Other Ambulatory Visit (HOSPITAL_COMMUNITY): Payer: Self-pay

## 2023-08-19 ENCOUNTER — Ambulatory Visit: Payer: Commercial Managed Care - PPO | Admitting: Family Medicine

## 2023-08-19 VITALS — BP 122/78 | HR 77 | Temp 97.7°F | Ht 65.0 in | Wt 342.4 lb

## 2023-08-19 DIAGNOSIS — M359 Systemic involvement of connective tissue, unspecified: Secondary | ICD-10-CM | POA: Diagnosis not present

## 2023-08-19 MED ORDER — ALBUTEROL SULFATE HFA 108 (90 BASE) MCG/ACT IN AERS
2.0000 | INHALATION_SPRAY | Freq: Four times a day (QID) | RESPIRATORY_TRACT | 3 refills | Status: DC | PRN
  Filled 2023-08-19: qty 6.7, 25d supply, fill #0

## 2023-08-19 MED ORDER — FLUTICASONE PROPIONATE 50 MCG/ACT NA SUSP
2.0000 | Freq: Every day | NASAL | 6 refills | Status: DC
  Filled 2023-08-19: qty 16, 30d supply, fill #0
  Filled 2023-10-20: qty 16, 30d supply, fill #1
  Filled 2024-01-13: qty 16, 30d supply, fill #2
  Filled 2024-04-24: qty 16, 30d supply, fill #3

## 2023-08-19 MED ORDER — GABAPENTIN 300 MG PO CAPS
300.0000 mg | ORAL_CAPSULE | Freq: Every day | ORAL | 3 refills | Status: DC
  Filled 2023-08-19: qty 30, 30d supply, fill #0
  Filled 2024-01-13: qty 30, 30d supply, fill #1
  Filled 2024-06-01: qty 30, 30d supply, fill #2

## 2023-08-19 MED ORDER — PREDNISONE 10 MG PO TABS
ORAL_TABLET | ORAL | 0 refills | Status: AC
  Filled 2023-08-19: qty 18, 9d supply, fill #0

## 2023-08-19 NOTE — Assessment & Plan Note (Signed)
Deteriorated.  Pt reports she cannot get in to see Rheum until next week.  At this time, is unable to move w/o pain.  Will start low dose, short course of Prednisone to improve sxs.  Note provided for work.  Reviewed supportive care and red flags that should prompt return.  Pt expressed understanding and is in agreement w/ plan.

## 2023-08-19 NOTE — Patient Instructions (Signed)
Follow up as needed or as scheduled START the Prednisone as directed- 3 pills at the same time x3 days, then 2 pills at the same time x3 days, then 1 pill daily.  Take w/ food  Continue to ice! Try and be mobile but don't overdo it Call with any questions or concerns Hang in there!!!

## 2023-08-19 NOTE — Progress Notes (Signed)
Subjective:    Patient ID: Vanessa Reeves, female    DOB: [birth year], 52 y.o.   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  Sxs started Friday.  Pt reports she develops dry mouth, dry skin, sores, severe pain.  Started using ice, elevation, tiger balm rub.  Has increased fluids.  Having a hard time going up and down stairs.  Pt reports this feels like a typical flare but worse.   Review of Systems For ROS see HPI     Objective:   Physical Exam Vitals reviewed.  Constitutional:      General: She is not in acute distress.    Appearance: She is well-developed. She is obese. She is not ill-appearing.     Comments: Obviously uncomfortable  HENT:     Head: Normocephalic and atraumatic.  Eyes:     Conjunctiva/sclera: Conjunctivae normal.     Pupils: Pupils are equal, round, and reactive to light.  Neck:     Thyroid: No thyromegaly.  Cardiovascular:     Rate and Rhythm: Normal rate and regular rhythm.     Heart sounds: Normal heart sounds. No murmur heard. Pulmonary:     Effort: Pulmonary effort is normal. No respiratory distress.     Breath sounds: Normal breath sounds.  Abdominal:     General: There is no distension.     Palpations: Abdomen is soft.     Tenderness: There is no abdominal tenderness.  Musculoskeletal:     Cervical back: Normal range of motion and neck supple.  Lymphadenopathy:     Cervical: No cervical adenopathy.  Skin:    General: Skin is warm and dry.  Neurological:     Mental Status: She is alert and oriented to person, place, and time.  Psychiatric:        Behavior: Behavior normal.           Assessment & Plan:

## 2023-08-20 ENCOUNTER — Other Ambulatory Visit (HOSPITAL_COMMUNITY): Payer: Self-pay

## 2023-08-20 ENCOUNTER — Other Ambulatory Visit: Payer: Self-pay | Admitting: Family Medicine

## 2023-08-20 ENCOUNTER — Other Ambulatory Visit: Payer: Self-pay

## 2023-08-23 ENCOUNTER — Encounter: Payer: Self-pay | Admitting: Physician Assistant

## 2023-08-23 ENCOUNTER — Other Ambulatory Visit: Payer: Self-pay

## 2023-08-23 ENCOUNTER — Ambulatory Visit: Payer: Commercial Managed Care - PPO

## 2023-08-23 ENCOUNTER — Ambulatory Visit: Payer: Commercial Managed Care - PPO | Attending: Physician Assistant | Admitting: Physician Assistant

## 2023-08-23 VITALS — BP 163/109 | HR 80 | Resp 16 | Ht 65.0 in | Wt 343.0 lb

## 2023-08-23 DIAGNOSIS — J4541 Moderate persistent asthma with (acute) exacerbation: Secondary | ICD-10-CM

## 2023-08-23 DIAGNOSIS — M25561 Pain in right knee: Secondary | ICD-10-CM | POA: Diagnosis not present

## 2023-08-23 DIAGNOSIS — Z79899 Other long term (current) drug therapy: Secondary | ICD-10-CM

## 2023-08-23 DIAGNOSIS — E0842 Diabetes mellitus due to underlying condition with diabetic polyneuropathy: Secondary | ICD-10-CM

## 2023-08-23 DIAGNOSIS — Z111 Encounter for screening for respiratory tuberculosis: Secondary | ICD-10-CM

## 2023-08-23 DIAGNOSIS — M533 Sacrococcygeal disorders, not elsewhere classified: Secondary | ICD-10-CM | POA: Diagnosis not present

## 2023-08-23 DIAGNOSIS — M359 Systemic involvement of connective tissue, unspecified: Secondary | ICD-10-CM

## 2023-08-23 DIAGNOSIS — R5383 Other fatigue: Secondary | ICD-10-CM | POA: Diagnosis not present

## 2023-08-23 DIAGNOSIS — E559 Vitamin D deficiency, unspecified: Secondary | ICD-10-CM

## 2023-08-23 DIAGNOSIS — M51369 Other intervertebral disc degeneration, lumbar region without mention of lumbar back pain or lower extremity pain: Secondary | ICD-10-CM

## 2023-08-23 DIAGNOSIS — M79641 Pain in right hand: Secondary | ICD-10-CM

## 2023-08-23 DIAGNOSIS — M47819 Spondylosis without myelopathy or radiculopathy, site unspecified: Secondary | ICD-10-CM

## 2023-08-23 DIAGNOSIS — M79642 Pain in left hand: Secondary | ICD-10-CM

## 2023-08-23 DIAGNOSIS — Z8249 Family history of ischemic heart disease and other diseases of the circulatory system: Secondary | ICD-10-CM | POA: Diagnosis not present

## 2023-08-23 DIAGNOSIS — M7918 Myalgia, other site: Secondary | ICD-10-CM

## 2023-08-23 DIAGNOSIS — G8929 Other chronic pain: Secondary | ICD-10-CM | POA: Diagnosis not present

## 2023-08-23 DIAGNOSIS — F900 Attention-deficit hyperactivity disorder, predominantly inattentive type: Secondary | ICD-10-CM

## 2023-08-23 DIAGNOSIS — G2581 Restless legs syndrome: Secondary | ICD-10-CM

## 2023-08-23 DIAGNOSIS — K219 Gastro-esophageal reflux disease without esophagitis: Secondary | ICD-10-CM

## 2023-08-23 DIAGNOSIS — F418 Other specified anxiety disorders: Secondary | ICD-10-CM

## 2023-08-23 DIAGNOSIS — I10 Essential (primary) hypertension: Secondary | ICD-10-CM

## 2023-08-23 DIAGNOSIS — Z87891 Personal history of nicotine dependence: Secondary | ICD-10-CM

## 2023-08-23 MED ORDER — PREDNISONE 5 MG PO TABS
ORAL_TABLET | ORAL | 0 refills | Status: AC
Start: 2023-08-23 — End: 2023-09-08
  Filled 2023-08-23: qty 40, 16d supply, fill #0

## 2023-08-23 NOTE — Progress Notes (Signed)
Pharmacy Note  Subjective: Patient presents today to [facility] for follow up office visit. Patient seen by the pharmacist for counseling on azathioprine (Imuran) for autoimmune disease.  Previous therapy includes:plaquenil.  Objective: CMP     Component Value Date/Time   NA 138 06/29/2023 0851   K 3.9 06/29/2023 0851   CL 99 06/29/2023 0851   CO2 31 06/29/2023 0851   GLUCOSE 145 (H) 06/29/2023 0851   BUN 14 06/29/2023 0851   CREATININE 0.93 06/29/2023 0851   CREATININE 0.94 12/23/2022 0913   CALCIUM 9.2 06/29/2023 0851   PROT 7.6 01/20/2023 1410   ALBUMIN 3.9 01/20/2023 1410   AST 12 01/20/2023 1410   ALT 11 01/20/2023 1410   ALKPHOS 89 01/20/2023 1410   BILITOT 0.3 01/20/2023 1410   GFRNONAA 64 12/16/2020 0843   GFRAA 74 12/16/2020 0843    CBC    Component Value Date/Time   WBC 7.5 01/20/2023 1410   RBC 4.48 01/20/2023 1410   HGB 12.3 01/20/2023 1410   HCT 37.5 01/20/2023 1410   PLT 402.0 (H) 01/20/2023 1410   MCV 83.7 01/20/2023 1410   MCH 27.2 12/23/2022 0913   MCHC 32.7 01/20/2023 1410   RDW 16.2 (H) 01/20/2023 1410   LYMPHSABS 2.6 01/20/2023 1410   MONOABS 0.6 01/20/2023 1410   EOSABS 0.1 01/20/2023 1410   BASOSABS 0.0 01/20/2023 1410    Baseline Immunosuppressant Therapy Labs TB GOLD    Latest Ref Rng & Units 07/03/2019    2:02 PM  Quantiferon TB Gold  Quantiferon TB Gold Plus NEGATIVE NEGATIVE    Hepatitis Panel    Latest Ref Rng & Units 09/04/2020    9:45 AM  Hepatitis  Hep C Ab NON-REACTI NON-REACTIVE    HIV Lab Results  Component Value Date   HIV NON-REACTIVE 12/21/2018   HIV NONREACTIVE 06/07/2015   Immunoglobulins   SPEP    Latest Ref Rng & Units 01/20/2023    2:10 PM  Serum Protein Electrophoresis  Total Protein 6.0 - 8.3 g/dL 7.6    [id number] No results found for: "G6PDH" TPMT No results found for: "TPMT"   Chest x-ray: 08/03/2019 - No active cardiopulmonary disease.  Assessment/Plan: Patient was counseled on the  purpose, proper use, and adverse effects of azathioprine including risk of infection, nausea, rash, and hair loss. Reviewed risk of cancer after long term use.  Discussed risk of myelosupression and reviewed importance of frequent lab work to monitor blood counts.  Standing orders placed.  Reviewed drug-drug interactions including contraindication with allopurinol.  Provided patient with educational materials on azathioprine and answered all questions.  Patient consented to azathioprine.  Will upload consent into the media tab.   Patient dose will be azathioprine 100mg  daily.  Prescription pending baseline lab results below.  Orders Placed This Encounter  Procedures   XR KNEE 3 VIEW RIGHT   Protein / creatinine ratio, urine   ANA   Thiopurine methyltransferase(tpmt)rbc   Sedimentation rate   C-reactive protein   Anti-DNA antibody, double-stranded   COMPLETE METABOLIC PANEL WITH GFR   CBC with Differential/Platelet   Hepatitis B core antibody, IgM   Hepatitis B surface antigen   QuantiFERON-TB Gold Plus   Serum protein electrophoresis with reflex   IgG, IgA, IgM   CBC with Differential/Platelet   COMPLETE METABOLIC PANEL WITH GFR   Chesley Mires, PharmD, MPH, BCPS, CPP Clinical Pharmacist (Rheumatology and Pulmonology)

## 2023-08-23 NOTE — Patient Instructions (Signed)
We will baseline labs completed before sending rx for azathioprine  Azathioprine Tablets What is this medication? AZATHIOPRINE (ay za THYE oh preen) prevents the body from rejecting an organ transplant. It works by lowering the body's immune system response. This helps the body accept the donor organ. It may also be used to treat rheumatoid arthritis. This medicine may be used for other purposes; ask your health care provider or pharmacist if you have questions. COMMON BRAND NAME(S): Azasan, Imuran What should I tell my care team before I take this medication? They need to know if you have any of these conditions: Infection Kidney disease Liver disease An unusual or allergic reaction to azathioprine, lactose, other medications, foods, dyes, or preservatives Pregnant or trying to get pregnant Breastfeeding How should I use this medication? Take this medication by mouth with a full glass of water. Take it as directed on the prescription label at the same time every day. Keep taking it unless your care team tells you to stop. Keep taking it even if you think you are better. Talk to your care team about the use of this medication in children. Special care may be needed. Overdosage: If you think you have taken too much of this medicine contact a poison control center or emergency room at once. NOTE: This medicine is only for you. Do not share this medicine with others. What if I miss a dose? If you miss a dose, take it as soon as you can. If it is almost time for your next dose, take only that dose. Do not take double or extra doses. What may interact with this medication? Do not take this medication with any of the following: Febuxostat Mercaptopurine This medication may also interact with the following: Allopurinol Aminosalicylates, such as sulfasalazine, mesalamine, balsalazide, and olsalazine Leflunomide Medications called ACE inhibitors, such as benazepril, captopril, enalapril,  fosinopril, quinapril, lisinopril, ramipril, and trandolapril Mycophenolate Sulfamethoxazole; trimethoprim Vaccines Warfarin This list may not describe all possible interactions. Give your health care provider a list of all the medicines, herbs, non-prescription drugs, or dietary supplements you use. Also tell them if you smoke, drink alcohol, or use illegal drugs. Some items may interact with your medicine. What should I watch for while using this medication? Visit your care team for regular checks on your progress. You may need blood work done while you are taking this medication. This medication may increase your risk of getting an infection. Call your care team for advice if you get a fever, chills, sore throat, or other symptoms of a cold or flu. Do not treat yourself. Try to avoid being around people who are sick. Talk to your care team about your risk of cancer. You may be more at risk for certain types of cancer if you take this medication. Talk to your care team if you may be pregnant. This medication can cause serious birth defects if taken during pregnancy. This medication may cause infertility. Talk to your care team if you are concerned about your fertility. What side effects may I notice from receiving this medication? Side effects that you should report to your care team as soon as possible: Allergic reactions--skin rash, itching, hives, swelling of the face, lips, tongue, or throat Change in your skin, such as a new growth, a sore that doesn't heal, or a change in a mole Dizziness, loss of balance or coordination, confusion or trouble speaking Infection--fever, chills, cough, sore throat, wounds that don't heal, pain or trouble when passing urine, general  feeling of discomfort or being unwell Low red blood cell level--unusual weakness or fatigue, dizziness, headache, trouble breathing Unusual bruising or bleeding Side effects that usually do not require medical attention (report  to your care team if they continue or are bothersome): Diarrhea Fatigue Nausea Vomiting This list may not describe all possible side effects. Call your doctor for medical advice about side effects. You may report side effects to FDA at 1-800-FDA-1088. Where should I keep my medication? Keep out of the reach of children and pets. Store at room temperature between 15 and 25 degrees C (59 and 77 degrees F). Protect from light. Get rid of any unused medication after the expiration date. To get rid of medications that are no longer needed or have expired: Take the medication to a medication take-back program. Check with your pharmacy or law enforcement to find a location. If you cannot return the medication, check the label or package insert to see if the medication should be thrown out in the garbage or flushed down the toilet. If you are not sure, ask your care team. If it is safe to put it in the trash, empty the medication out of the container. Mix the medication with cat litter, dirt, coffee grounds, or other unwanted substance. Seal the mixture in a bag or container. Put it in the trash. NOTE: This sheet is a summary. It may not cover all possible information. If you have questions about this medicine, talk to your doctor, pharmacist, or health care provider.  2024 Elsevier/Gold Standard (2022-03-24 00:00:00)

## 2023-08-24 ENCOUNTER — Other Ambulatory Visit: Payer: Self-pay | Admitting: *Deleted

## 2023-08-24 ENCOUNTER — Encounter: Payer: Self-pay | Admitting: *Deleted

## 2023-08-24 DIAGNOSIS — Z79899 Other long term (current) drug therapy: Secondary | ICD-10-CM

## 2023-08-24 DIAGNOSIS — Z111 Encounter for screening for respiratory tuberculosis: Secondary | ICD-10-CM

## 2023-08-24 DIAGNOSIS — M359 Systemic involvement of connective tissue, unspecified: Secondary | ICD-10-CM | POA: Diagnosis not present

## 2023-08-24 NOTE — Telephone Encounter (Signed)
CMP pending--need to review renal function.   NSAIDs can be harmful for the kidney function especially if used long term.   Patient is currently taking a prednisone taper--cannot take prednisone and NSAIDs (meloxicam)--increases risk for GI perforation.  I would avoid starting imuran and meloxicam concurrently in case she has a side effect.  If she would rather try meloxicam instead of imuran she will require close lab monitoring (if renal function is normal) and will need to complete the prednisone taper.

## 2023-08-24 NOTE — Progress Notes (Signed)
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  No chondrocalcinosis or medial or lateral compartment narrowing.  Please notify the patient.

## 2023-08-25 ENCOUNTER — Other Ambulatory Visit (HOSPITAL_COMMUNITY): Payer: Self-pay

## 2023-08-26 ENCOUNTER — Encounter: Payer: Self-pay | Admitting: Family Medicine

## 2023-08-27 ENCOUNTER — Other Ambulatory Visit: Payer: Self-pay | Admitting: Family Medicine

## 2023-08-27 ENCOUNTER — Other Ambulatory Visit: Payer: Self-pay

## 2023-08-27 MED ORDER — CLOTRIMAZOLE-BETAMETHASONE 1-0.05 % EX CREA
1.0000 | TOPICAL_CREAM | Freq: Every day | CUTANEOUS | 0 refills | Status: DC
  Filled 2023-08-27: qty 45, 30d supply, fill #0

## 2023-08-31 NOTE — Telephone Encounter (Signed)
TPMT is still pending. Waiting for all results to be back prior to making interpretation

## 2023-09-01 LAB — ANA: Anti Nuclear Antibody (ANA): POSITIVE — AB

## 2023-09-01 LAB — QUANTIFERON-TB GOLD PLUS
Mitogen-NIL: 6.63 [IU]/mL
NIL: 0.01 [IU]/mL
QuantiFERON-TB Gold Plus: NEGATIVE
TB1-NIL: 0 [IU]/mL
TB2-NIL: 0 [IU]/mL

## 2023-09-01 LAB — CBC WITH DIFFERENTIAL/PLATELET
Absolute Lymphocytes: 3516 {cells}/uL (ref 850–3900)
Absolute Monocytes: 623 {cells}/uL (ref 200–950)
Basophils Absolute: 27 {cells}/uL (ref 0–200)
Basophils Relative: 0.3 %
Eosinophils Absolute: 18 {cells}/uL (ref 15–500)
Eosinophils Relative: 0.2 %
HCT: 38.2 % (ref 35.0–45.0)
Hemoglobin: 11.9 g/dL (ref 11.7–15.5)
MCH: 26.8 pg — ABNORMAL LOW (ref 27.0–33.0)
MCHC: 31.2 g/dL — ABNORMAL LOW (ref 32.0–36.0)
MCV: 86 fL (ref 80.0–100.0)
MPV: 9.6 fL (ref 7.5–12.5)
Monocytes Relative: 7 %
Neutro Abs: 4717 {cells}/uL (ref 1500–7800)
Neutrophils Relative %: 53 %
Platelets: 358 10*3/uL (ref 140–400)
RBC: 4.44 10*6/uL (ref 3.80–5.10)
RDW: 14.7 % (ref 11.0–15.0)
Total Lymphocyte: 39.5 %
WBC: 8.9 10*3/uL (ref 3.8–10.8)

## 2023-09-01 LAB — COMPLETE METABOLIC PANEL WITH GFR
AG Ratio: 1 (calc) (ref 1.0–2.5)
ALT: 10 U/L (ref 6–29)
AST: 10 U/L (ref 10–35)
Albumin: 3.6 g/dL (ref 3.6–5.1)
Alkaline phosphatase (APISO): 90 U/L (ref 37–153)
BUN: 16 mg/dL (ref 7–25)
CO2: 32 mmol/L (ref 20–32)
Calcium: 9.6 mg/dL (ref 8.6–10.4)
Chloride: 97 mmol/L — ABNORMAL LOW (ref 98–110)
Creat: 0.84 mg/dL (ref 0.50–1.03)
Globulin: 3.7 g/dL (ref 1.9–3.7)
Glucose, Bld: 138 mg/dL — ABNORMAL HIGH (ref 65–99)
Potassium: 4 mmol/L (ref 3.5–5.3)
Sodium: 140 mmol/L (ref 135–146)
Total Bilirubin: 0.2 mg/dL (ref 0.2–1.2)
Total Protein: 7.3 g/dL (ref 6.1–8.1)
eGFR: 85 mL/min/{1.73_m2} (ref >=60)

## 2023-09-01 LAB — THIOPURINE METHYLTRANSFERASE (TPMT), RBC: Thiopurine Methyltransferase, RBC: 14 nmol/h/mL

## 2023-09-01 LAB — PROTEIN / CREATININE RATIO, URINE
Creatinine, Urine: 274 mg/dL (ref 20–275)
Protein/Creat Ratio: 84 mg/g{creat} (ref 24–184)
Protein/Creatinine Ratio: 0.084 mg/mg{creat} (ref 0.024–0.184)
Total Protein, Urine: 23 mg/dL (ref 5–24)

## 2023-09-01 LAB — ANTI-NUCLEAR AB-TITER (ANA TITER): ANA Titer 1: 1:80 {titer} — ABNORMAL HIGH

## 2023-09-01 LAB — PROTEIN ELECTROPHORESIS, SERUM, WITH REFLEX
Albumin ELP: 3.4 g/dL — ABNORMAL LOW (ref 3.8–4.8)
Alpha 1: 0.4 g/dL — ABNORMAL HIGH (ref 0.2–0.3)
Alpha 2: 0.9 g/dL (ref 0.5–0.9)
Beta 2: 0.6 g/dL — ABNORMAL HIGH (ref 0.2–0.5)
Beta Globulin: 0.5 g/dL (ref 0.4–0.6)
Gamma Globulin: 1.5 g/dL (ref 0.8–1.7)
Total Protein: 7.3 g/dL (ref 6.1–8.1)

## 2023-09-01 LAB — ANTI-DNA ANTIBODY, DOUBLE-STRANDED: ds DNA Ab: 1 [IU]/mL

## 2023-09-01 LAB — IGG, IGA, IGM
IgG (Immunoglobin G), Serum: 1767 mg/dL — ABNORMAL HIGH (ref 600–1640)
IgM, Serum: 38 mg/dL — ABNORMAL LOW (ref 50–300)
Immunoglobulin A: 345 mg/dL — ABNORMAL HIGH (ref 47–310)

## 2023-09-01 LAB — HEPATITIS B SURFACE ANTIGEN: Hepatitis B Surface Ag: NONREACTIVE

## 2023-09-01 LAB — SEDIMENTATION RATE: Sed Rate: 53 mm/h — ABNORMAL HIGH (ref 0–20)

## 2023-09-01 LAB — HEPATITIS B CORE ANTIBODY, IGM: Hep B C IgM: NONREACTIVE

## 2023-09-01 LAB — C-REACTIVE PROTEIN: CRP: 26.1 mg/L — ABNORMAL HIGH (ref <8.0)

## 2023-09-01 LAB — IFE INTERPRETATION

## 2023-09-02 ENCOUNTER — Other Ambulatory Visit (HOSPITAL_COMMUNITY): Payer: Self-pay

## 2023-09-02 ENCOUNTER — Other Ambulatory Visit: Payer: Self-pay | Admitting: *Deleted

## 2023-09-02 DIAGNOSIS — Z79899 Other long term (current) drug therapy: Secondary | ICD-10-CM

## 2023-09-02 MED ORDER — AZATHIOPRINE 50 MG PO TABS
50.0000 mg | ORAL_TABLET | Freq: Every day | ORAL | 2 refills | Status: DC
  Filled 2023-09-02: qty 30, 30d supply, fill #0

## 2023-09-02 NOTE — Telephone Encounter (Signed)
-----   Message from Gearldine Bienenstock sent at 09/02/2023  7:04 AM EDT ----- IFE normal. Hepatitis B negative  TB gold negative    dsDNA is negative.  CRP and ESR are both elevated likely due to underlying inflammation.  Immunoglobulins are elevated.   CBC and CMP stable. Protein creatinine ratio WNL ANA remains positive.     TMPT is WNL.  Ok to initiate Imuran 50 mg 1 tablet by mouth daily.  Recheck CBC with diff and CMP with GFR in 2 weeks.

## 2023-09-02 NOTE — Progress Notes (Signed)
IFE normal. Hepatitis B negative  TB gold negative    dsDNA is negative.  CRP and ESR are both elevated likely due to underlying inflammation.  Immunoglobulins are elevated.   CBC and CMP stable. Protein creatinine ratio WNL ANA remains positive.     TMPT is WNL.  Ok to initiate Imuran 50 mg 1 tablet by mouth daily.  Recheck CBC with diff and CMP with GFR in 2 weeks.

## 2023-09-06 ENCOUNTER — Ambulatory Visit: Payer: Commercial Managed Care - PPO | Admitting: Family Medicine

## 2023-09-10 ENCOUNTER — Encounter: Payer: Self-pay | Admitting: Family Medicine

## 2023-09-10 ENCOUNTER — Other Ambulatory Visit (HOSPITAL_COMMUNITY): Payer: Self-pay

## 2023-09-10 DIAGNOSIS — M25561 Pain in right knee: Secondary | ICD-10-CM

## 2023-09-10 NOTE — Telephone Encounter (Signed)
Pt asking that you just send a referral in.

## 2023-09-13 ENCOUNTER — Ambulatory Visit (INDEPENDENT_AMBULATORY_CARE_PROVIDER_SITE_OTHER): Payer: Commercial Managed Care - PPO | Admitting: Physician Assistant

## 2023-09-13 VITALS — BP 168/96 | HR 86

## 2023-09-13 DIAGNOSIS — I10 Essential (primary) hypertension: Secondary | ICD-10-CM

## 2023-09-13 DIAGNOSIS — G8929 Other chronic pain: Secondary | ICD-10-CM

## 2023-09-13 DIAGNOSIS — M25561 Pain in right knee: Secondary | ICD-10-CM

## 2023-09-13 NOTE — Telephone Encounter (Signed)
Spoke with patient and scheduled her for a Cortisone injection on 09/13/2023 at 9:30 am.

## 2023-09-14 ENCOUNTER — Ambulatory Visit: Payer: Commercial Managed Care - PPO | Admitting: Cardiovascular Disease

## 2023-09-14 NOTE — Progress Notes (Signed)
Unable to perform cortisone injection today due to elevated blood pressure.  Blood pressure was checked both manually and with automated device.   Patient took her antihypertensive medication around 10 am and returned in the afternoon for a BP recheck and her BP remained elevated. Discussed the risks of an elevation in blood pressure especially if cortisone was administered.  Patient stated her BP was elevated due to the level pain she is experiencing.  Patient was advised to contact PCP to discuss medication options.  Once her BP is well-controlled she can return to the office for a right knee joint cortisone injection.  She voiced understanding.    Sherron Ales, PA-C

## 2023-09-16 ENCOUNTER — Other Ambulatory Visit: Payer: Self-pay | Admitting: Family Medicine

## 2023-09-16 ENCOUNTER — Other Ambulatory Visit: Payer: Self-pay

## 2023-09-16 ENCOUNTER — Other Ambulatory Visit (HOSPITAL_COMMUNITY): Payer: Self-pay

## 2023-09-16 ENCOUNTER — Ambulatory Visit: Payer: Commercial Managed Care - PPO | Admitting: Family Medicine

## 2023-09-16 DIAGNOSIS — M51369 Other intervertebral disc degeneration, lumbar region without mention of lumbar back pain or lower extremity pain: Secondary | ICD-10-CM

## 2023-09-16 MED ORDER — TRAMADOL-ACETAMINOPHEN 37.5-325 MG PO TABS
1.0000 | ORAL_TABLET | Freq: Four times a day (QID) | ORAL | 0 refills | Status: DC | PRN
  Filled 2023-09-16: qty 30, 8d supply, fill #0

## 2023-09-16 MED ORDER — CYCLOBENZAPRINE HCL 10 MG PO TABS
10.0000 mg | ORAL_TABLET | Freq: Every day | ORAL | 0 refills | Status: DC
  Filled 2023-09-16: qty 30, 30d supply, fill #0

## 2023-09-16 NOTE — Telephone Encounter (Signed)
Requested Prescriptions   Pending Prescriptions Disp Refills   cyclobenzaprine (FLEXERIL) 10 MG tablet 30 tablet 0    Sig: Take 1 tablet (10 mg total) by mouth at bedtime.   traMADol-acetaminophen (ULTRACET) 37.5-325 MG tablet 30 tablet 0    Sig: Take 1 tablet by mouth every 6 (six) hours as needed.   Date of patient request:09/16/2023 Date of last refill: 08/11/2023 Last refill amount: 30   I have sent the flexeril.

## 2023-09-20 ENCOUNTER — Encounter: Payer: Self-pay | Admitting: Family Medicine

## 2023-09-20 ENCOUNTER — Ambulatory Visit: Payer: Commercial Managed Care - PPO | Admitting: Family Medicine

## 2023-09-20 ENCOUNTER — Other Ambulatory Visit (HOSPITAL_COMMUNITY): Payer: Self-pay

## 2023-09-20 ENCOUNTER — Other Ambulatory Visit: Payer: Self-pay

## 2023-09-20 VITALS — BP 138/100 | HR 83 | Temp 98.0°F | Ht 65.0 in | Wt 350.1 lb

## 2023-09-20 DIAGNOSIS — I10 Essential (primary) hypertension: Secondary | ICD-10-CM

## 2023-09-20 MED ORDER — ALBUTEROL SULFATE HFA 108 (90 BASE) MCG/ACT IN AERS
2.0000 | INHALATION_SPRAY | Freq: Four times a day (QID) | RESPIRATORY_TRACT | 3 refills | Status: DC | PRN
  Filled 2023-09-20 (×2): qty 6.7, 25d supply, fill #0
  Filled 2023-12-16: qty 6.7, 25d supply, fill #1
  Filled 2024-01-13: qty 6.7, 25d supply, fill #2

## 2023-09-20 MED ORDER — AMLODIPINE BESYLATE 5 MG PO TABS
5.0000 mg | ORAL_TABLET | Freq: Every day | ORAL | 1 refills | Status: DC
  Filled 2023-09-20 (×2): qty 30, 30d supply, fill #0
  Filled 2023-11-01: qty 30, 30d supply, fill #1

## 2023-09-20 NOTE — Assessment & Plan Note (Signed)
Deteriorated.  Pt's BP was too high at Rheumatology for them to give her a steroid shot in her knee.  She has been in nearly constant pain, having a hard time sleeping b/c of the pain.  BP's in this office- going back over a year- have always been within range.  I suspect her pain is playing a large part in her elevated readings.  Will start Amlodipine 5mg  daily in hopes of bringing BP into normal range so the shot can be given and she will get some relief.  Pt expressed understanding and is in agreement w/ plan.

## 2023-09-20 NOTE — Patient Instructions (Signed)
Follow up as needed or as scheduled ADD the Amlodipine daily Call with any questions or concerns Hang in there!! Happy Thanksgiving!!!

## 2023-09-20 NOTE — Progress Notes (Signed)
   Subjective:    Patient ID: Vanessa Reeves, female    DOB: 11-13-1970, 52 y.o.   MRN: [id number]  HPI HTN- chronic problem, on Atenolol 100mg  at bedtime, hydrochlorothiazide 25mg  daily, Losartan 100mg  daily.  Rheumatology told her that her BP was too high to get a steroid injection for knee pain.  She has been in considerable pain.  On the day in question, pt was rushing to get to the appt, was in pain.  Pt's BP has always been adequately controlled at our visits.  No CP, SOB, HA's, visual changes   Review of Systems For ROS see HPI     Objective:   Physical Exam Vitals reviewed.  Constitutional:      General: She is not in acute distress.    Appearance: Normal appearance. She is well-developed. She is obese. She is not ill-appearing.  HENT:     Head: Normocephalic and atraumatic.  Eyes:     Conjunctiva/sclera: Conjunctivae normal.     Pupils: Pupils are equal, round, and reactive to light.  Neck:     Thyroid: No thyromegaly.  Cardiovascular:     Rate and Rhythm: Normal rate and regular rhythm.     Heart sounds: Normal heart sounds. No murmur heard. Pulmonary:     Effort: Pulmonary effort is normal. No respiratory distress.     Breath sounds: Normal breath sounds.  Abdominal:     General: There is no distension.     Palpations: Abdomen is soft.     Tenderness: There is no abdominal tenderness.  Musculoskeletal:     Cervical back: Normal range of motion and neck supple.  Lymphadenopathy:     Cervical: No cervical adenopathy.  Skin:    General: Skin is warm and dry.  Neurological:     General: No focal deficit present.     Mental Status: She is alert and oriented to person, place, and time.  Psychiatric:        Mood and Affect: Mood normal.        Behavior: Behavior normal.        Thought Content: Thought content normal.           Assessment & Plan:

## 2023-09-21 NOTE — Telephone Encounter (Signed)
Please call patient to schedule as an add-on

## 2023-09-24 ENCOUNTER — Ambulatory Visit (INDEPENDENT_AMBULATORY_CARE_PROVIDER_SITE_OTHER): Payer: Commercial Managed Care - PPO | Admitting: Physician Assistant

## 2023-09-24 ENCOUNTER — Encounter: Payer: Self-pay | Admitting: Physician Assistant

## 2023-09-24 ENCOUNTER — Encounter: Payer: Self-pay | Admitting: Family Medicine

## 2023-09-24 DIAGNOSIS — G8929 Other chronic pain: Secondary | ICD-10-CM

## 2023-09-24 DIAGNOSIS — M25561 Pain in right knee: Secondary | ICD-10-CM | POA: Diagnosis not present

## 2023-09-24 MED ORDER — METHYLPREDNISOLONE ACETATE 40 MG/ML IJ SUSP
40.0000 mg | INTRAMUSCULAR | Status: AC | PRN
  Administered 2023-09-24: 40 mg via INTRA_ARTICULAR

## 2023-09-24 MED ORDER — LIDOCAINE HCL 1 % IJ SOLN
3.0000 mL | INTRAMUSCULAR | Status: AC | PRN
  Administered 2023-09-24: 3 mL
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
## 2023-09-29 ENCOUNTER — Other Ambulatory Visit: Payer: Self-pay

## 2023-10-04 ENCOUNTER — Other Ambulatory Visit: Payer: Self-pay | Admitting: Physician Assistant

## 2023-10-04 DIAGNOSIS — M359 Systemic involvement of connective tissue, unspecified: Secondary | ICD-10-CM

## 2023-10-04 MED ORDER — HYDROXYCHLOROQUINE SULFATE 200 MG PO TABS
200.0000 mg | ORAL_TABLET | Freq: Two times a day (BID) | ORAL | 0 refills | Status: DC
  Filled 2023-10-04: qty 180, 90d supply, fill #0

## 2023-10-04 NOTE — Telephone Encounter (Signed)
Last Fill: 03/08/2023  Eye exam: 06/28/2023 WNL    Labs: 08/24/2023 CBC and CMP stable.   Next Visit: Due December 2024. Message sent to the front to schedule.   Last Visit: 08/23/2023  DX:Autoimmune disease   Current Dose per office note 08/23/2023: Plaquenil 200 mg 1 tablet by mouth twice daily.   Okay to refill Plaquenil?

## 2023-10-04 NOTE — Telephone Encounter (Signed)
Please schedule patient a follow up visit. Patient due December 2024. Thanks!   Follow-Up Instructions: Return in about 8 weeks (around 10/18/2023) for Autoimmune Disease.

## 2023-10-05 ENCOUNTER — Other Ambulatory Visit (HOSPITAL_COMMUNITY): Payer: Self-pay

## 2023-10-05 ENCOUNTER — Other Ambulatory Visit: Payer: Self-pay

## 2023-10-05 ENCOUNTER — Ambulatory Visit: Payer: Commercial Managed Care - PPO | Admitting: Physician Assistant

## 2023-10-08 ENCOUNTER — Other Ambulatory Visit (HOSPITAL_COMMUNITY): Payer: Self-pay

## 2023-10-20 ENCOUNTER — Other Ambulatory Visit: Payer: Self-pay

## 2023-10-20 ENCOUNTER — Ambulatory Visit: Payer: Commercial Managed Care - PPO | Admitting: Family Medicine

## 2023-10-20 ENCOUNTER — Other Ambulatory Visit (HOSPITAL_COMMUNITY): Payer: Self-pay

## 2023-10-20 VITALS — BP 138/98 | Temp 98.7°F | Ht 65.0 in | Wt 343.2 lb

## 2023-10-20 DIAGNOSIS — E0842 Diabetes mellitus due to underlying condition with diabetic polyneuropathy: Secondary | ICD-10-CM

## 2023-10-20 DIAGNOSIS — I10 Essential (primary) hypertension: Secondary | ICD-10-CM | POA: Diagnosis not present

## 2023-10-20 DIAGNOSIS — H6992 Unspecified Eustachian tube disorder, left ear: Secondary | ICD-10-CM

## 2023-10-20 DIAGNOSIS — E1142 Type 2 diabetes mellitus with diabetic polyneuropathy: Secondary | ICD-10-CM

## 2023-10-20 DIAGNOSIS — E785 Hyperlipidemia, unspecified: Secondary | ICD-10-CM | POA: Diagnosis not present

## 2023-10-20 LAB — BASIC METABOLIC PANEL
BUN: 11 mg/dL (ref 6–23)
CO2: 33 meq/L — ABNORMAL HIGH (ref 19–32)
Calcium: 9.2 mg/dL (ref 8.4–10.5)
Chloride: 100 meq/L (ref 96–112)
Creatinine, Ser: 0.84 mg/dL (ref 0.40–1.20)
GFR: 80.7 mL/min (ref >60.00)
Glucose, Bld: 158 mg/dL — ABNORMAL HIGH (ref 70–99)
Potassium: 3.8 meq/L (ref 3.5–5.1)
Sodium: 140 meq/L (ref 135–145)

## 2023-10-20 LAB — CBC WITH DIFFERENTIAL/PLATELET
Basophils Absolute: 0 10*3/uL (ref 0.0–0.1)
Basophils Relative: 0.5 % (ref 0.0–3.0)
Eosinophils Absolute: 0.1 10*3/uL (ref 0.0–0.7)
Eosinophils Relative: 2.4 % (ref 0.0–5.0)
HCT: 36.1 % (ref 36.0–46.0)
Hemoglobin: 11.7 g/dL — ABNORMAL LOW (ref 12.0–15.0)
Lymphocytes Relative: 35.6 % (ref 12.0–46.0)
Lymphs Abs: 2.2 10*3/uL (ref 0.7–4.0)
MCHC: 32.5 g/dL (ref 30.0–36.0)
MCV: 86.3 fL (ref 78.0–100.0)
Monocytes Absolute: 0.4 10*3/uL (ref 0.1–1.0)
Monocytes Relative: 6.8 % (ref 3.0–12.0)
Neutro Abs: 3.3 10*3/uL (ref 1.4–7.7)
Neutrophils Relative %: 54.7 % (ref 43.0–77.0)
Platelets: 367 10*3/uL (ref 150.0–400.0)
RBC: 4.19 Mil/uL (ref 3.87–5.11)
RDW: 15.7 % — ABNORMAL HIGH (ref 11.5–15.5)
WBC: 6.1 10*3/uL (ref 4.0–10.5)

## 2023-10-20 LAB — LIPID PANEL
Cholesterol: 135 mg/dL (ref 0–200)
HDL: 55.5 mg/dL (ref >39.00)
LDL Cholesterol: 67 mg/dL (ref 0–99)
NonHDL: 79.75
Total CHOL/HDL Ratio: 2
Triglycerides: 66 mg/dL (ref 0.0–149.0)
VLDL: 13.2 mg/dL (ref 0.0–40.0)

## 2023-10-20 LAB — TSH: TSH: 3.2 u[IU]/mL (ref 0.35–5.50)

## 2023-10-20 LAB — HEPATIC FUNCTION PANEL
ALT: 12 U/L (ref 0–35)
AST: 13 U/L (ref 0–37)
Albumin: 3.8 g/dL (ref 3.5–5.2)
Alkaline Phosphatase: 84 U/L (ref 39–117)
Bilirubin, Direct: 0.1 mg/dL (ref 0.0–0.3)
Total Bilirubin: 0.4 mg/dL (ref 0.2–1.2)
Total Protein: 7.9 g/dL (ref 6.0–8.3)

## 2023-10-20 LAB — HEMOGLOBIN A1C: Hgb A1c MFr Bld: 8.3 % — ABNORMAL HIGH (ref 4.6–6.5)

## 2023-10-20 MED ORDER — METFORMIN HCL ER 500 MG PO TB24
500.0000 mg | ORAL_TABLET | Freq: Two times a day (BID) | ORAL | 1 refills | Status: DC
  Filled 2023-10-20 – 2024-01-13 (×2): qty 180, 90d supply, fill #0

## 2023-10-20 MED ORDER — FLUTICASONE PROPIONATE HFA 110 MCG/ACT IN AERO
2.0000 | INHALATION_SPRAY | Freq: Two times a day (BID) | RESPIRATORY_TRACT | 12 refills | Status: AC
  Filled 2023-10-20 – 2023-12-09 (×2): qty 12, 30d supply, fill #0
  Filled 2024-01-13: qty 12, 30d supply, fill #1

## 2023-10-20 MED ORDER — OZEMPIC (0.25 OR 0.5 MG/DOSE) 2 MG/3ML ~~LOC~~ SOPN
0.2500 mg | PEN_INJECTOR | SUBCUTANEOUS | 0 refills | Status: DC
  Filled 2023-10-20: qty 3, 30d supply, fill #0

## 2023-10-20 MED ORDER — ATENOLOL 50 MG PO TABS
100.0000 mg | ORAL_TABLET | Freq: Every day | ORAL | 3 refills | Status: DC
  Filled 2023-10-20 – 2023-12-01 (×2): qty 180, 90d supply, fill #0
  Filled 2024-01-13 – 2024-04-05 (×2): qty 180, 90d supply, fill #1
  Filled 2024-05-08 – 2024-07-12 (×5): qty 180, 90d supply, fill #2

## 2023-10-20 MED ORDER — OZEMPIC (0.25 OR 0.5 MG/DOSE) 2 MG/3ML ~~LOC~~ SOPN
0.5000 mg | PEN_INJECTOR | SUBCUTANEOUS | 1 refills | Status: DC
  Filled 2023-10-20: qty 3, fill #0
  Filled 2023-10-21: qty 3, 28d supply, fill #0
  Filled 2023-11-17: qty 3, 28d supply, fill #1

## 2023-10-20 MED ORDER — ALPRAZOLAM 0.5 MG PO TABS
0.5000 mg | ORAL_TABLET | Freq: Two times a day (BID) | ORAL | 1 refills | Status: DC | PRN
  Filled 2023-10-20 – 2023-11-10 (×2): qty 30, 15d supply, fill #0
  Filled 2023-12-09: qty 30, 15d supply, fill #1
  Filled ????-??-??: fill #0

## 2023-10-20 MED ORDER — LOSARTAN POTASSIUM 50 MG PO TABS
100.0000 mg | ORAL_TABLET | Freq: Every day | ORAL | 1 refills | Status: DC
  Filled 2023-10-20 – 2024-01-13 (×3): qty 180, 90d supply, fill #0
  Filled 2024-05-08: qty 180, 90d supply, fill #1

## 2023-10-20 MED ORDER — HYDROCHLOROTHIAZIDE 25 MG PO TABS
25.0000 mg | ORAL_TABLET | Freq: Every day | ORAL | 0 refills | Status: DC
  Filled 2023-10-20 (×2): qty 90, 90d supply, fill #0

## 2023-10-20 MED ORDER — FREESTYLE LITE W/DEVICE KIT
PACK | 2 refills | Status: AC
  Filled 2023-10-20: qty 1, fill #0

## 2023-10-20 MED ORDER — FREESTYLE LANCETS MISC
12 refills | Status: DC
  Filled 2023-10-20 – 2024-01-13 (×2): qty 100, 50d supply, fill #0

## 2023-10-20 MED ORDER — FUROSEMIDE 20 MG PO TABS
20.0000 mg | ORAL_TABLET | Freq: Every day | ORAL | 1 refills | Status: DC
  Filled 2023-10-20 (×2): qty 90, 90d supply, fill #0
  Filled 2024-01-13: qty 90, 90d supply, fill #1

## 2023-10-20 NOTE — Assessment & Plan Note (Signed)
L sided.  Restart Flonase.

## 2023-10-20 NOTE — Patient Instructions (Addendum)
Follow up in 1 month to recheck BP and Ozempic We'll notify you of your lab results and make any changes if needed Continue to work on healthy diet and regular exercise- you're doing great! START the Ozempic 0.25mg  weekly x4 weeks and then increase to 0.5mg  weekly Call with any questions or concerns Stay Safe!  Stay Healthy! Happy Early [place] Ouch! Happy Holidays!!!

## 2023-10-20 NOTE — Assessment & Plan Note (Signed)
Chronic problem.  Elevated today despite Amlodipine 5mg  daily, Atenolol 50mg  daily, hydrochlorothiazide 25mg  daily and Losartan 100mg  daily.  She admits to being under a lot of stress at work (works 2nd shift at [employer]) and doesn't feel safe when there at times.  She is trying to change shifts if not job entirely.  She will return in 1 month to reassess BP and adjust meds prn.

## 2023-10-20 NOTE — Assessment & Plan Note (Signed)
Chronic problem.  Last LDL 76 w/o medication.  Check labs and determine if meds are needed.

## 2023-10-20 NOTE — Progress Notes (Signed)
   Subjective:    Patient ID: Vanessa Reeves, female    DOB: 1971-01-03, 52 y.o.   MRN: [id number]  HPI DM- chronic problem, on Metformin XR 500mg  BID.  Down 7 lbs.  UTD on foot exam, eye exam, microalbumin.  Denies symptomatic lows.  + known neuropathy in feet.  Pt is interested in starting Ozempic  HTN- chronic problem, on Amlodipine 5mg  daily, Atenolol 50mg , hydrochlorothiazide 25mg  daily, Losartan 100mg  daily.  BP is elevated today.  Work has her stressed- is working night shift at [employer].  No CP, SOB, HA's, visual changes, edema.  Hyperlipidemia- chronic problem, not currently on a statin.  No abd pain, N/V.  L ear pain- sxs started 3-4 days ago.  No fever.  Denies nasal congestion.  No cough.   Review of Systems For ROS see HPI     Objective:   Physical Exam Vitals reviewed.  Constitutional:      General: She is not in acute distress.    Appearance: She is well-developed. She is obese. She is not ill-appearing.  HENT:     Head: Normocephalic and atraumatic.     Right Ear: Tympanic membrane and ear canal normal.     Left Ear: Ear canal normal.     Ears:     Comments: L TM retracted Eyes:     Conjunctiva/sclera: Conjunctivae normal.     Pupils: Pupils are equal, round, and reactive to light.  Neck:     Thyroid: No thyromegaly.  Cardiovascular:     Rate and Rhythm: Normal rate and regular rhythm.     Heart sounds: Normal heart sounds. No murmur heard. Pulmonary:     Effort: Pulmonary effort is normal. No respiratory distress.     Breath sounds: Normal breath sounds.  Abdominal:     General: There is no distension.     Palpations: Abdomen is soft.     Tenderness: There is no abdominal tenderness.  Musculoskeletal:     Cervical back: Normal range of motion and neck supple.  Lymphadenopathy:     Cervical: No cervical adenopathy.  Skin:    General: Skin is warm and dry.  Neurological:     Mental Status: She is alert and oriented to person, place, and  time.  Psychiatric:        Behavior: Behavior normal.           Assessment & Plan:

## 2023-10-20 NOTE — Assessment & Plan Note (Signed)
Chronic problem.  Currently on Metformin XR 500mg  BID.  She is down 7 lbs since last visit.  Is interested in starting Ozempic for both diabetes and weight management.  Will start at 0.25mg  weekly and then increase to 0.5mg  weekly.  She is to continue to work on [employer] and regular physical activity.  UTD on foot exam, eye exam, and microalbumin.  Check labs.

## 2023-10-21 ENCOUNTER — Telehealth: Payer: Self-pay

## 2023-10-21 ENCOUNTER — Other Ambulatory Visit (HOSPITAL_COMMUNITY): Payer: Self-pay

## 2023-10-21 NOTE — Telephone Encounter (Signed)
PA request has been Submitted. New Encounter created for follow up. For additional info see Pharmacy Prior Auth telephone encounter from 10/21/23.

## 2023-10-21 NOTE — Telephone Encounter (Signed)
Pharmacy Patient Advocate Encounter  Received notification from [facility] that Prior Authorization for  Ozempic (0.25 or 0.5 MG/DOSE) 2MG /3ML pen-injectors  has been APPROVED from 10/21/23 to 10/20/24. Spoke to pharmacy to process.Copay is $24.99.    Prior Authorization Reference Number: [id number]

## 2023-10-21 NOTE — Telephone Encounter (Signed)
Pharmacy Patient Advocate Encounter   Received notification from Pt Calls Messages that prior authorization for Ozempic (0.25 or 0.5 MG/DOSE) 2MG /3ML pen-injectors is required/requested.   Insurance verification completed.   The patient is insured through [facility] .   Per test claim: PA required; PA submitted to above mentioned insurance via CoverMyMeds Key/confirmation #/EOC [id number] Status is pending

## 2023-10-22 ENCOUNTER — Telehealth: Payer: Self-pay

## 2023-10-22 ENCOUNTER — Other Ambulatory Visit (HOSPITAL_COMMUNITY): Payer: Self-pay

## 2023-10-22 NOTE — Telephone Encounter (Signed)
-----   Message from Neena Rhymes sent at 10/22/2023  4:03 PM EST ----- A1C has jumped from 7 --> 8.3% which means your sugar is not well controlled.  Starting the Ozempic should help bring this back into range.  Continue your Metformin and try and work on low carb/low sugar diet and regular exercise.  Remainder of labs look good!

## 2023-10-22 NOTE — Telephone Encounter (Signed)
Noted  

## 2023-10-22 NOTE — Telephone Encounter (Signed)
Patient has been contacted and was made aware of PA info

## 2023-10-25 ENCOUNTER — Encounter: Payer: Self-pay | Admitting: Physician Assistant

## 2023-10-25 ENCOUNTER — Other Ambulatory Visit (HOSPITAL_COMMUNITY): Payer: Self-pay

## 2023-10-25 ENCOUNTER — Ambulatory Visit: Payer: Commercial Managed Care - PPO | Admitting: Physician Assistant

## 2023-10-25 DIAGNOSIS — M25561 Pain in right knee: Secondary | ICD-10-CM | POA: Diagnosis not present

## 2023-10-25 NOTE — Telephone Encounter (Addendum)
Patient reviewed via Mychart

## 2023-10-25 NOTE — Telephone Encounter (Signed)
Called patient to discuss lab work, no answer, left a message for the patient to call back to discuss or review by MyChart if that is prefered.

## 2023-10-25 NOTE — Progress Notes (Signed)
Office Visit Note   Patient: Vanessa Reeves           Date of Birth: 06-21-1971           MRN: [id number] Visit Date: 10/25/2023              Requested by: Sheliah Hatch, MD [address] [place],  [place] [address] PCP: Sheliah Hatch, MD  Chief Complaint  Patient presents with   Right Knee - Follow-up      HPI: Patient is a pleasant 52 year old woman who presents today for follow-up on her right knee patellofemoral arthritis.  She said she did very well with the injection and has been doing the close chain exercises I showed her.  She feels a lot better but still has some clicking in her knee  Assessment & Plan: Visit Diagnoses: Right knee pain  Plan: She will continue to do her exercises we talked about injections in the future if she needs them.  Would advocate doing a steroid injection as she seemed to get good relief from that we will see how long it lasts.  She is also working on losing weight which will also benefit  Follow-Up Instructions: Return if symptoms worsen or fail to improve.   Ortho Exam  Patient is alert, oriented, no adenopathy, well-dressed, normal affect, normal respiratory effort. Right knee no effusion no erythema compartments are soft and compressible she is neurovascular intact some mild grinding but no pain  Imaging: No results found. No images are attached to the encounter.  Labs: Lab Results  Component Value Date   HGBA1C 8.3 (H) 10/20/2023   HGBA1C 7.0 (H) 05/31/2023   HGBA1C 7.0 (H) 01/20/2023   ESRSEDRATE 53 (H) 08/24/2023   ESRSEDRATE 103 (H) 05/31/2023   ESRSEDRATE 67 (H) 12/23/2022   CRP 26.1 (H) 08/24/2023   REPTSTATUS 07/30/2009 FINAL 07/29/2009   CULT  07/29/2009    Multiple bacterial morphotypes present, none predominant. Suggest appropriate recollection if clinically indicated.   LABORGA NO GROWTH 03/30/2014     Lab Results  Component Value Date   ALBUMIN 3.8 10/20/2023   ALBUMIN 3.9 01/20/2023    ALBUMIN 4.0 07/07/2022    No results found for: "MG" Lab Results  Component Value Date   VD25OH 15.65 (L) 01/20/2023   VD25OH 36 07/14/2022   VD25OH 22.98 (L) 11/28/2021    No results found for: "PREALBUMIN"    Latest Ref Rng & Units 10/20/2023    9:13 AM 08/24/2023    9:21 AM 01/20/2023    2:10 PM  CBC EXTENDED  WBC 4.0 - 10.5 K/uL 6.1  8.9  7.5   RBC 3.87 - 5.11 Mil/uL 4.19  4.44  4.48   Hemoglobin 12.0 - 15.0 g/dL 62.1  30.8  65.7   HCT 36.0 - 46.0 % 36.1  38.2  37.5   Platelets 150.0 - 400.0 K/uL 367.0  358  402.0   NEUT# 1.4 - 7.7 K/uL 3.3  4,717  4.2   Lymph# 0.7 - 4.0 K/uL 2.2   2.6      There is no height or weight on file to calculate BMI.  Orders:  No orders of the defined types were placed in this encounter.  No orders of the defined types were placed in this encounter.    Procedures: No procedures performed  Clinical Data: No additional findings.  ROS:  All other systems negative, except as noted in the HPI. Review of Systems  Objective:  Vital Signs: LMP 12/04/2011   Specialty Comments:  No specialty comments available.  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   Hyperlipidemia associated with type 2 diabetes mellitus (HCC) 02/21/2020   Insomnia 02/21/2020   Vitamin D deficiency 02/21/2020   Degenerative disc disease, lumbar 02/21/2020   DM (diabetes mellitus) type II controlled, neurological manifestation (HCC) 06/14/2019   Special screening for malignant neoplasms, colon 04/11/2018   Memory changes 11/23/2017   RLS (restless legs syndrome) 03/17/2017   ADHD (attention deficit hyperactivity disorder), 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   Reactive airway disease 08/21/2016    Gastroesophageal reflux disease without esophagitis 12/20/2015   Physical exam 12/14/2014   Breast mass, left 02/16/2014   Chest pain 12/29/2013   Depression with anxiety 07/17/2013   Essential hypertension 07/17/2013   Eustachian tube dysfunction 07/17/2013   Adult BMI 50.0-59.9 kg/sq m (HCC) 07/17/2013   Past Medical History:  Diagnosis Date   Allergy    Anxiety    Arthritis    HANDS   Asthma    HAS MDI   Complication of anesthesia    Depression    Diabetes mellitus without complication (HCC)    Difficult airway for intubation    2013 used Video Laryngoscope   GERD (gastroesophageal reflux disease)    Herpes    Hyperlipidemia    Hypertension    RLS (restless legs syndrome) 03/17/2017   Right leg   Special screening for malignant neoplasms, colon 04/11/2018    Family History  Problem Relation Age of Onset   Hypertension Mother    Clotting disorder Mother    Stroke Father    Thrombosis Father    Congestive Heart Failure Father    Hypertension Paternal Grandmother    Diabetes Mellitus II Paternal Grandmother    Lupus Paternal Grandmother    Healthy Daughter    Healthy Son    Colon cancer Neg Hx    Esophageal cancer Neg Hx    Pancreatic cancer Neg Hx    Stomach cancer Neg Hx    Liver disease Neg Hx    Colon polyps Neg Hx     Past Surgical History:  Procedure Laterality Date   COLONOSCOPY WITH PROPOFOL N/A 07/09/2021   Procedure: COLONOSCOPY WITH PROPOFOL;  Surgeon: Napoleon Form, MD;  Location: [facility];  Service: Endoscopy;  Laterality: N/A;   DILATION AND CURETTAGE OF UTERUS     POLYPECTOMY  07/09/2021   Procedure: POLYPECTOMY;  Surgeon: Napoleon Form, MD;  Location: [facility];  Service: Endoscopy;;   TOTAL ABDOMINAL HYSTERECTOMY     TUBAL LIGATION     Social History   Occupational History   Not on file  Tobacco Use   Smoking status: Former    Types: Cigarettes    Passive exposure: Never   Smokeless tobacco: Never  Vaping Use    Vaping status: Never Used  Substance and Sexual Activity   Alcohol use: Yes    Comment: occ   Drug use: Not Currently    Types: Marijuana   Sexual activity: Yes    Birth control/protection: Surgical

## 2023-10-26 ENCOUNTER — Encounter: Payer: Self-pay | Admitting: Family Medicine

## 2023-10-28 ENCOUNTER — Other Ambulatory Visit (HOSPITAL_COMMUNITY): Payer: Self-pay

## 2023-10-28 ENCOUNTER — Other Ambulatory Visit: Payer: Self-pay

## 2023-10-29 ENCOUNTER — Ambulatory Visit: Payer: Commercial Managed Care - PPO | Admitting: Physician Assistant

## 2023-11-01 ENCOUNTER — Other Ambulatory Visit: Payer: Self-pay

## 2023-11-10 ENCOUNTER — Other Ambulatory Visit (HOSPITAL_COMMUNITY): Payer: Self-pay

## 2023-11-11 ENCOUNTER — Other Ambulatory Visit: Payer: Self-pay

## 2023-11-17 ENCOUNTER — Other Ambulatory Visit (HOSPITAL_COMMUNITY): Payer: Self-pay

## 2023-12-01 ENCOUNTER — Other Ambulatory Visit (HOSPITAL_COMMUNITY): Payer: Self-pay

## 2023-12-01 ENCOUNTER — Other Ambulatory Visit: Payer: Self-pay | Admitting: Family Medicine

## 2023-12-01 ENCOUNTER — Other Ambulatory Visit: Payer: Self-pay

## 2023-12-01 ENCOUNTER — Encounter: Payer: Self-pay | Admitting: Family Medicine

## 2023-12-01 DIAGNOSIS — E559 Vitamin D deficiency, unspecified: Secondary | ICD-10-CM

## 2023-12-02 NOTE — Telephone Encounter (Signed)
Called Pt message letting her to call office to schedule F/U and I'm canceling blood work visit and Beverely Low will probably address any concerns at the F/U visit.

## 2023-12-09 ENCOUNTER — Other Ambulatory Visit: Payer: Self-pay | Admitting: Family Medicine

## 2023-12-09 ENCOUNTER — Other Ambulatory Visit: Payer: Self-pay

## 2023-12-09 ENCOUNTER — Other Ambulatory Visit (HOSPITAL_COMMUNITY): Payer: Self-pay

## 2023-12-09 DIAGNOSIS — M51369 Other intervertebral disc degeneration, lumbar region without mention of lumbar back pain or lower extremity pain: Secondary | ICD-10-CM

## 2023-12-10 ENCOUNTER — Other Ambulatory Visit (HOSPITAL_COMMUNITY): Payer: Self-pay

## 2023-12-10 MED ORDER — CYCLOBENZAPRINE HCL 10 MG PO TABS
10.0000 mg | ORAL_TABLET | Freq: Every day | ORAL | 0 refills | Status: DC
  Filled 2023-12-10: qty 30, 30d supply, fill #0

## 2023-12-10 MED ORDER — TRAMADOL-ACETAMINOPHEN 37.5-325 MG PO TABS
1.0000 | ORAL_TABLET | Freq: Four times a day (QID) | ORAL | 0 refills | Status: DC | PRN
  Filled 2023-12-10: qty 30, 8d supply, fill #0

## 2023-12-13 ENCOUNTER — Encounter: Payer: Self-pay | Admitting: Family Medicine

## 2023-12-13 ENCOUNTER — Ambulatory Visit: Payer: Commercial Managed Care - PPO | Admitting: Family Medicine

## 2023-12-13 ENCOUNTER — Other Ambulatory Visit (HOSPITAL_BASED_OUTPATIENT_CLINIC_OR_DEPARTMENT_OTHER): Payer: Self-pay

## 2023-12-13 ENCOUNTER — Other Ambulatory Visit: Payer: Self-pay

## 2023-12-13 ENCOUNTER — Other Ambulatory Visit (HOSPITAL_COMMUNITY): Payer: Self-pay

## 2023-12-13 VITALS — BP 144/78 | HR 77 | Temp 97.9°F | Ht 65.0 in | Wt 330.2 lb

## 2023-12-13 DIAGNOSIS — I10 Essential (primary) hypertension: Secondary | ICD-10-CM | POA: Diagnosis not present

## 2023-12-13 DIAGNOSIS — Z6841 Body Mass Index (BMI) 40.0 and over, adult: Secondary | ICD-10-CM

## 2023-12-13 DIAGNOSIS — R110 Nausea: Secondary | ICD-10-CM | POA: Diagnosis not present

## 2023-12-13 DIAGNOSIS — E1142 Type 2 diabetes mellitus with diabetic polyneuropathy: Secondary | ICD-10-CM

## 2023-12-13 MED ORDER — ALPRAZOLAM 0.5 MG PO TABS
0.5000 mg | ORAL_TABLET | Freq: Two times a day (BID) | ORAL | 1 refills | Status: DC | PRN
  Filled 2023-12-13 – 2024-03-11 (×3): qty 30, 15d supply, fill #0
  Filled 2024-05-21 – 2024-05-22 (×2): qty 30, 15d supply, fill #1

## 2023-12-13 MED ORDER — ONDANSETRON HCL 4 MG PO TABS
4.0000 mg | ORAL_TABLET | Freq: Three times a day (TID) | ORAL | 1 refills | Status: DC | PRN
  Filled 2023-12-13: qty 30, 10d supply, fill #0
  Filled 2024-01-13: qty 30, 10d supply, fill #1

## 2023-12-13 MED ORDER — SEMAGLUTIDE (1 MG/DOSE) 4 MG/3ML ~~LOC~~ SOPN
1.0000 mg | PEN_INJECTOR | SUBCUTANEOUS | 1 refills | Status: DC
  Filled 2023-12-13: qty 3, 28d supply, fill #0
  Filled 2024-01-21: qty 3, 28d supply, fill #1

## 2023-12-13 MED ORDER — HYDROCHLOROTHIAZIDE 25 MG PO TABS
25.0000 mg | ORAL_TABLET | Freq: Every day | ORAL | 0 refills | Status: DC
  Filled 2023-12-13 – 2024-01-13 (×2): qty 90, 90d supply, fill #0

## 2023-12-13 MED ORDER — AMLODIPINE BESYLATE 5 MG PO TABS
5.0000 mg | ORAL_TABLET | Freq: Every day | ORAL | 1 refills | Status: DC
  Filled 2023-12-13: qty 90, 90d supply, fill #0
  Filled 2024-01-13 – 2024-04-05 (×2): qty 90, 90d supply, fill #1

## 2023-12-13 NOTE — Patient Instructions (Signed)
 Follow up as scheduled or as needed INCREASE the Ozempic  to 1mg  weekly RESTART the Amlodipine  5mg  daily Keep up the good work on healthy diet and regular exercise- you're doing great! Call with any questions or concerns Stay Safe!  Stay Healthy! Happy Valentine's Day!!!

## 2023-12-13 NOTE — Progress Notes (Signed)
   Subjective:    Patient ID: Vanessa Reeves, female    DOB: [birth year], 53 y.o.   MRN: [id number]  HPI HTN- chronic problem, on Amlodipine  5mg  daily, Atenolol  100mg  at bedtime, hydrochlorothiazide  25mg  daily, Losartan  100mg  daily.  BP is elevated today and has been recently.  Reports she has been out of the Amlodipine .  No CP, SOB, HA's, visual changes, edema.  DM- chronic problem.  Currently on Ozempic  0.5mg  daily.  Down 13 lbs since December.  Pt reports feeling good.  Is able to do stairs more easily.  Food noise 'is gone'.  Has been able to eliminate sweets.  Pt wants to increase to 1mg  weekly.   Review of Systems For ROS see HPI     Objective:   Physical Exam Vitals reviewed.  Constitutional:      General: She is not in acute distress.    Appearance: Normal appearance. She is well-developed. She is obese. She is not ill-appearing.  HENT:     Head: Normocephalic and atraumatic.  Eyes:     Conjunctiva/sclera: Conjunctivae normal.     Pupils: Pupils are equal, round, and reactive to light.  Neck:     Thyroid : No thyromegaly.  Cardiovascular:     Rate and Rhythm: Normal rate and regular rhythm.     Pulses: Normal pulses.     Heart sounds: Normal heart sounds. No murmur heard. Pulmonary:     Effort: Pulmonary effort is normal. No respiratory distress.     Breath sounds: Normal breath sounds.  Abdominal:     General: There is no distension.     Palpations: Abdomen is soft.     Tenderness: There is no abdominal tenderness.  Musculoskeletal:     Cervical back: Normal range of motion and neck supple.     Right lower leg: No edema.     Left lower leg: No edema.  Lymphadenopathy:     Cervical: No cervical adenopathy.  Skin:    General: Skin is warm and dry.  Neurological:     Mental Status: She is alert and oriented to person, place, and time.  Psychiatric:        Mood and Affect: Mood normal.        Behavior: Behavior normal.        Thought Content: Thought content  normal.           Assessment & Plan:  HTN- deteriorated.  chronic problem.  Pt had run out of Amlodipine  so BP is elevated today.  Will restart Amlodipine  daily and continue all other medications.  Pt expressed understanding and is in agreement w/ plan.   DM/Obesity- pt is down 13 lbs since 12/18.  She reports she has nausea on the day of her shot and the next day (Zofran  refilled) but she is otherwise tolerating w/o difficulty.  She is thrilled that the food noise and cravings are gone.  Applauded her efforts.  Will increase dose to 1mg  weekly.  Pt expressed understanding and is in agreement w/ plan.

## 2023-12-16 ENCOUNTER — Other Ambulatory Visit (HOSPITAL_COMMUNITY): Payer: Self-pay

## 2023-12-17 ENCOUNTER — Ambulatory Visit: Payer: Commercial Managed Care - PPO | Admitting: Physician Assistant

## 2023-12-17 ENCOUNTER — Encounter: Payer: Self-pay | Admitting: Physician Assistant

## 2023-12-17 DIAGNOSIS — M79661 Pain in right lower leg: Secondary | ICD-10-CM

## 2023-12-17 NOTE — Progress Notes (Signed)
Office Visit Note   Patient: Vanessa Reeves           Date of Birth: May 15, 1971           MRN: [id number] Visit Date: 12/17/2023              Requested by: Sheliah Hatch, MD [address] [place],  [place] [address] PCP: Sheliah Hatch, MD  Chief Complaint  Patient presents with   Right Knee - Pain    09/24/23 RT KNEE CORTISONE INJECTION      HPI: Vanessa Reeves is a pleasant 53 year old woman who have seen in the past for history of right knee pain.  She denies any injuries.  She said that she just noticed that her knee started getting sore again and is requesting an injection today.   Assessment & Plan: Visit Diagnoses:  1. Pain in right lower leg     Plan: Right knee pain will go forward with an injection today.  I did caution her her last hemoglobin A1c was over 8.  She says she has been working on this and is changed her medications and is recently lost 15 pounds I told her to keep a good eye on her blood sugars  Follow-Up Instructions: Return if symptoms worsen or fail to improve.   Ortho Exam  Patient is alert, oriented, no adenopathy, well-dressed, normal affect, normal respiratory effort. No effusion no erythema compartments are soft and compressible she does have some grinding with range of motion she is neurovascularly intact negative Denna Haggard' sign compartments are soft  Imaging: No results found. No images are attached to the encounter.  Labs: Lab Results  Component Value Date   HGBA1C 8.3 (H) 10/20/2023   HGBA1C 7.0 (H) 05/31/2023   HGBA1C 7.0 (H) 01/20/2023   ESRSEDRATE 53 (H) 08/24/2023   ESRSEDRATE 103 (H) 05/31/2023   ESRSEDRATE 67 (H) 12/23/2022   CRP 26.1 (H) 08/24/2023   REPTSTATUS 07/30/2009 FINAL 07/29/2009   CULT  07/29/2009    Multiple bacterial morphotypes present, none predominant. Suggest appropriate recollection if clinically indicated.   LABORGA NO GROWTH 03/30/2014     Lab Results  Component Value Date   ALBUMIN 3.8  10/20/2023   ALBUMIN 3.9 01/20/2023   ALBUMIN 4.0 07/07/2022    No results found for: "MG" Lab Results  Component Value Date   VD25OH 15.65 (L) 01/20/2023   VD25OH 36 07/14/2022   VD25OH 22.98 (L) 11/28/2021    No results found for: "PREALBUMIN"    Latest Ref Rng & Units 10/20/2023    9:13 AM 08/24/2023    9:21 AM 01/20/2023    2:10 PM  CBC EXTENDED  WBC 4.0 - 10.5 K/uL 6.1  8.9  7.5   RBC 3.87 - 5.11 Mil/uL 4.19  4.44  4.48   Hemoglobin 12.0 - 15.0 g/dL 19.1  47.8  29.5   HCT 36.0 - 46.0 % 36.1  38.2  37.5   Platelets 150.0 - 400.0 K/uL 367.0  358  402.0   NEUT# 1.4 - 7.7 K/uL 3.3  4,717  4.2   Lymph# 0.7 - 4.0 K/uL 2.2   2.6      There is no height or weight on file to calculate BMI.  Orders:  No orders of the defined types were placed in this encounter.  No orders of the defined types were placed in this encounter.    Procedures: No procedures performed  Clinical Data: No additional findings.  ROS:  All other systems negative, except as noted in the HPI. Review of Systems  Objective: Vital Signs: LMP 12/04/2011   Specialty Comments:  No specialty comments available.  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   Hyperlipidemia associated with type 2 diabetes mellitus (HCC) 02/21/2020   Insomnia 02/21/2020   Vitamin D deficiency 02/21/2020   Degenerative disc disease, lumbar 02/21/2020   DM (diabetes mellitus) type II controlled, neurological manifestation (HCC) 06/14/2019   Special screening for malignant neoplasms, colon 04/11/2018   Memory changes 11/23/2017   RLS (restless legs syndrome) 03/17/2017   ADHD (attention deficit hyperactivity disorder), 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   Reactive  airway disease 08/21/2016   Gastroesophageal reflux disease without esophagitis 12/20/2015   Physical exam 12/14/2014   Breast mass, left 02/16/2014   Chest pain 12/29/2013   Depression with anxiety 07/17/2013   Essential hypertension 07/17/2013   Eustachian tube dysfunction 07/17/2013   Adult BMI 50.0-59.9 kg/sq m (HCC) 07/17/2013   Past Medical History:  Diagnosis Date   Allergy    Anxiety    Arthritis    HANDS   Asthma    HAS MDI   Complication of anesthesia    Depression    Diabetes mellitus without complication (HCC)    Difficult airway for intubation    2013 used Video Laryngoscope   GERD (gastroesophageal reflux disease)    Herpes    Hyperlipidemia    Hypertension    RLS (restless legs syndrome) 03/17/2017   Right leg   Special screening for malignant neoplasms, colon 04/11/2018    Family History  Problem Relation Age of Onset   Hypertension Mother    Clotting disorder Mother    Stroke Father    Thrombosis Father    Congestive Heart Failure Father    Hypertension Paternal Grandmother    Diabetes Mellitus II Paternal Grandmother    Lupus Paternal Grandmother    Healthy Daughter    Healthy Son    Colon cancer Neg Hx    Esophageal cancer Neg Hx    Pancreatic cancer Neg Hx    Stomach cancer Neg Hx    Liver disease Neg Hx    Colon polyps Neg Hx     Past Surgical History:  Procedure Laterality Date   COLONOSCOPY WITH PROPOFOL N/A 07/09/2021   Procedure: COLONOSCOPY WITH PROPOFOL;  Surgeon: Napoleon Form, MD;  Location: [facility];  Service: Endoscopy;  Laterality: N/A;   DILATION AND CURETTAGE OF UTERUS     POLYPECTOMY  07/09/2021   Procedure: POLYPECTOMY;  Surgeon: Napoleon Form, MD;  Location: [facility];  Service: Endoscopy;;   TOTAL ABDOMINAL HYSTERECTOMY     TUBAL LIGATION     Social History   Occupational History   Not on file  Tobacco Use   Smoking status: Former    Types: Cigarettes    Passive exposure: Never   Smokeless  tobacco: Never  Vaping Use   Vaping status: Never Used  Substance and Sexual Activity   Alcohol use: Yes    Comment: occ   Drug use: Not Currently    Types: Marijuana   Sexual activity: Yes    Birth control/protection: Surgical

## 2023-12-24 NOTE — Progress Notes (Unsigned)
 Office Visit Note  Patient: Vanessa Reeves             Date of Birth: 06-19-1971           MRN: [id number]             PCP: Sheliah Hatch, MD Referring: Sheliah Hatch, MD Visit Date: 12/30/2023 Occupation: @GUAROCC @  Subjective:  Medication monitoring   History of Present Illness: Vanessa Reeves is a 53 y.o. female with history of autoimmune disease and myofascial pain.  Patient remains on Plaquenil 200 mg 1 tablet by mouth twice daily.  She continues to tolerate Plaquenil without any side effects.  Patient reports that she decided against initiating Imuran.  Patient reports that she had a right knee joint cortisone injection performed on 09/24/2023 and again on 12/16/2022.  Patient states that in December 2024 she was also initiated on Ozempic due to elevated hemoglobin A1c--8.3%.  Patient states that she has been steadily losing weight which has made climbing steps and getting up from a chair easier.  Her energy level has also been improving.  Overall she has been feeling better since initiating Ozempic and would like to hold off on starting Imuran at this time. Patient states that with the cooler winter months as well as with weight loss she has noticed some increased cold intolerance and has intermittent symptoms of Raynaud's phenomenon.  Patient states that her rashes have started to heal.  She continues to have some mouth dryness and occasional sores but has been using ACT, which has been helpful. She denies any recent or recurrent infections.   Activities of Daily Living:  Patient reports morning stiffness for 10 minutes.   Patient Reports nocturnal pain.  Difficulty dressing/grooming: Denies Difficulty climbing stairs: Denies Difficulty getting out of chair: Denies Difficulty using hands for taps, buttons, cutlery, and/or writing: Reports  Review of Systems  Constitutional:  Negative for fatigue.  HENT:  Positive for mouth sores and mouth dryness.   Eyes:   Negative for dryness.  Respiratory:  Negative for shortness of breath.   Cardiovascular:  Negative for chest pain and palpitations.  Gastrointestinal:  Positive for constipation. Negative for blood in stool and diarrhea.  Endocrine: Negative for increased urination.  Genitourinary:  Negative for involuntary urination.  Musculoskeletal:  Positive for joint pain, gait problem, joint pain, joint swelling, myalgias, muscle weakness, morning stiffness, muscle tenderness and myalgias.  Skin:  Positive for hair loss and sensitivity to sunlight. Negative for color change and rash.  Allergic/Immunologic: Positive for susceptible to infections.  Neurological:  Negative for dizziness and headaches.  Hematological:  Negative for swollen glands.  Psychiatric/Behavioral:  Positive for depressed mood and sleep disturbance. The patient is nervous/anxious.     PMFS History:  Patient Active Problem List   Diagnosis Date Noted   Pain in right knee 09/24/2023   Autoimmune disease (HCC) 08/19/2023   Polyp of transverse colon    Polyp of sigmoid colon    Polyp of rectum    Adjustment disorder with depressed mood 10/24/2020   Hearing loss of right ear 09/04/2020   Hyperlipidemia associated with type 2 diabetes mellitus (HCC) 02/21/2020   Insomnia 02/21/2020   Vitamin D deficiency 02/21/2020   Degenerative disc disease, lumbar 02/21/2020   DM (diabetes mellitus) type II controlled, neurological manifestation (HCC) 06/14/2019   Special screening for malignant neoplasms, colon 04/11/2018   Memory changes 11/23/2017   RLS (restless legs syndrome) 03/17/2017   ADHD (attention deficit hyperactivity  disorder), 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   Reactive airway disease 08/21/2016   Gastroesophageal reflux disease without esophagitis 12/20/2015   Physical exam 12/14/2014   Breast mass, left 02/16/2014   Chest pain 12/29/2013   Depression with anxiety 07/17/2013   Essential hypertension 07/17/2013   Eustachian  tube dysfunction 07/17/2013   Adult BMI 50.0-59.9 kg/sq m (HCC) 07/17/2013    Past Medical History:  Diagnosis Date   Allergy    Anxiety    Arthritis    HANDS   Asthma    HAS MDI   Complication of anesthesia    Depression    Diabetes mellitus without complication (HCC)    Difficult airway for intubation    2013 used Video Laryngoscope   GERD (gastroesophageal reflux disease)    Herpes    Hyperlipidemia    Hypertension    RLS (restless legs syndrome) 03/17/2017   Right leg   Special screening for malignant neoplasms, colon 04/11/2018    Family History  Problem Relation Age of Onset   Hypertension Mother    Clotting disorder Mother    Stroke Father    Thrombosis Father    Congestive Heart Failure Father    Hypertension Paternal Grandmother    Diabetes Mellitus II Paternal Grandmother    Lupus Paternal Grandmother    Healthy Daughter    Healthy Son    Colon cancer Neg Hx    Esophageal cancer Neg Hx    Pancreatic cancer Neg Hx    Stomach cancer Neg Hx    Liver disease Neg Hx    Colon polyps Neg Hx    Past Surgical History:  Procedure Laterality Date   COLONOSCOPY WITH PROPOFOL N/A 07/09/2021   Procedure: COLONOSCOPY WITH PROPOFOL;  Surgeon: Napoleon Form, MD;  Location: [facility];  Service: Endoscopy;  Laterality: N/A;   DILATION AND CURETTAGE OF UTERUS     POLYPECTOMY  07/09/2021   Procedure: POLYPECTOMY;  Surgeon: Napoleon Form, MD;  Location: [facility];  Service: Endoscopy;;   TOTAL ABDOMINAL HYSTERECTOMY     TUBAL LIGATION     Social History   Social History Narrative   Lives with husband and 2 children in a 2 story home.     Works as a Psychologist, sport and exercise at [employer].     Highest level of education:  High school, in college now   Immunization History  Administered Date(s) Administered   Influenza, Seasonal, Injecte, Preservative Fre 07/12/2023   Influenza,inj,Quad PF,6+ Mos 07/17/2013, 07/27/2014, 07/26/2015, 08/21/2016, 07/06/2017,  07/29/2018, 07/03/2019, 07/07/2022   Influenza-Unspecified 08/20/2020, 08/19/2021   MMR 03/08/2017, 04/05/2017   Moderna Sars-Covid-2 Vaccination 06/06/2020, 07/04/2020   PPD Test 07/26/2015, 03/07/2018, 05/23/2018   Pneumococcal Polysaccharide-23 01/16/2015   Tdap 10/14/2015     Objective: Vital Signs: BP 112/77 (BP Location: Left Arm, Patient Position: Sitting, Cuff Size: Large)   Pulse 74   Resp 16   Ht 5\' 5"  (1.651 m)   Wt (!) 327 lb (148.3 kg)   LMP 12/04/2011   BMI 54.42 kg/m    Physical Exam Vitals and nursing note reviewed.  Constitutional:      Appearance: She is well-developed.  HENT:     Head: Normocephalic and atraumatic.  Eyes:     Conjunctiva/sclera: Conjunctivae normal.  Cardiovascular:     Rate and Rhythm: Normal rate and regular rhythm.     Heart sounds: Normal heart sounds.  Pulmonary:     Effort: Pulmonary effort is normal.     Breath sounds: Normal breath  sounds.  Abdominal:     General: Bowel sounds are normal.     Palpations: Abdomen is soft.  Musculoskeletal:     Cervical back: Normal range of motion.  Lymphadenopathy:     Cervical: No cervical adenopathy.  Skin:    General: Skin is warm and dry.     Capillary Refill: Capillary refill takes less than 2 seconds.  Neurological:     Mental Status: She is alert and oriented to person, place, and time.  Psychiatric:        Behavior: Behavior normal.      Musculoskeletal Exam: C-spine has good range of motion.  Shoulder joints, elbow joints, wrist joints, MCPs, PIPs, DIPs have good range of motion with no synovitis.  Complete fist formation bilaterally.  Hip joints have good range of motion with no groin pain.  Knee joints have good range of motion with no warmth or effusion.  Ankle joints have good range of motion with no tenderness or joint swelling.  CDAI Exam: CDAI Score: -- Patient Global: --; Provider Global: -- Swollen: --; Tender: -- Joint Exam 12/30/2023   No joint exam has been  documented for this visit   There is currently no information documented on the homunculus. Go to the Rheumatology activity and complete the homunculus joint exam.  Investigation: No additional findings.  Imaging: No results found.  Recent Labs: Lab Results  Component Value Date   WBC 6.1 10/20/2023   HGB 11.7 (L) 10/20/2023   PLT 367.0 10/20/2023   NA 140 10/20/2023   K 3.8 10/20/2023   CL 100 10/20/2023   CO2 33 (H) 10/20/2023   GLUCOSE 158 (H) 10/20/2023   BUN 11 10/20/2023   CREATININE 0.84 10/20/2023   BILITOT 0.4 10/20/2023   ALKPHOS 84 10/20/2023   AST 13 10/20/2023   ALT 12 10/20/2023   PROT 7.9 10/20/2023   ALBUMIN 3.8 10/20/2023   CALCIUM 9.2 10/20/2023   GFRAA 74 12/16/2020   QFTBGOLDPLUS NEGATIVE 08/24/2023    Speciality Comments: PLQ Eye Exam: 06/28/2023 WNL  @ [facility] Follow up 1 year  Procedures:  No procedures performed Allergies: Penicillins   Assessment / Plan:     Visit Diagnoses: Autoimmune disease (HCC) - AVISE+ANA, +APS IgM positive: Patient remains on Plaquenil 200 mg 1 tablet by mouth twice daily.  She decided against initiating Imuran since she was initiated on Ozempic in December 2024.  Since initiating Ozempic she has been losing weight and her energy level has improved.  She has been having less difficulty climbing steps and rising from a seated position since losing weight.  Her right knee joint pain has improved since having a cortisone injection performed on 09/24/2023 and 12/17/2023.  No warmth or effusion was noted on examination today.  She has no synovitis on examination. Lab work from 08/24/2023 was reviewed today in the office: ANA 1: 80 nuclear, dense fine speckled, IFE-no monoclonal proteins noted, urine protein creatinine ratio within normal limits, TPMT within normal limits, ESR 53, CRP 26.1, dsDNA 1.  Plan to obtain the following lab work today.  Patient will remain on Plaquenil 200 mg 1 tablet by mouth twice daily.  A  refill of Plaquenil sent to the pharmacy today.  She would like to hold off on initiating Imuran at this time.  She will notify us if she develops any new or worsening symptoms.  She will follow-up in the office in 5 months or sooner if needed.  - Plan: hydroxychloroquine (PLAQUENIL) 200  MG tablet, CBC with Differential/Platelet, Protein / creatinine ratio, urine, COMPLETE METABOLIC PANEL WITH GFR, Anti-DNA antibody, double-stranded, C3 and C4, Sedimentation rate  High risk medication use - Plaquenil 200 mg 1 tablet by mouth twice daily.  Patient decided against starting imuran.  CBC, BMP, and hepatic function panel updated on 10/20/23.  Orders for CBC and CMP released today. PLQ Eye Exam: 06/28/2023 WNL @ [facility] Follow up 1 year   - Plan: CBC with Differential/Platelet, COMPLETE METABOLIC PANEL WITH GFR  Chronic pain of right knee: X-rays of the right knee were updated on 08/23/2023 which were consistent with chondromalacia patella.  Patient had a right knee joint cortisone injection on 09/24/2023 and again on 12/17/23 at [facility].  Her right knee joint pain has improved since having the cortisone injection performed as well as since starting Ozempic and losing weight.  She has had less difficulty rising from a seated position and climbing steps.  On examination today she has good range of motion with no warmth or effusion.  Bilateral hand pain - Ultrasound on 12/23/2022 showed mild synovitis in the right second MCP joint. Responsive to prednisone.  No tenderness or synovitis on examination today.  She will remain on Plaquenil as prescribed for now.  Myofascial pain: She is taking Flexeril 10 mg 1 tablet at bedtime for muscle spasms and gabapentin 300 mg 1 capsule at bedtime.  She takes Ultracet every 6 hours as needed for pain relief and remains on trazodone 50 mg half tablet to 1 tablet at bedtime for insomnia.  Other fatigue: Her energy level has improved since initiating Ozempic and  losing weight.  Family history of blood clots - Beta-2 glycoprotein antibodies, anticardiolipin antibodies, and lupus anticoagulant negative on 09/16/2021.  Chronic SI joint pain: Not currently symptomatic.  Facet arthropathy - Lumbar X-rays updated on 02/25/22: show grade one anterolisthesis L4-5 2-3 mm no change from 07/2020.  No symptoms of radiculopathy at this time.  Patient plans on continuing to remain Ozempic to improve her hemoglobin A1c and to aid in weight loss.   Other medical conditions are listed as follows:   Diabetic polyneuropathy associated with diabetes mellitus due to underlying condition [facility]): Hemoglobin A1c 8.3% on 10/20/2023.  Patient was started on Ozempic in December 2024--she has been steadily losing weight and has noticed an improvement in her energy level.  She will be following back up with Dr. Beverely Low for recheck of hemoglobin A1c next month.  Moderate persistent reactive airway disease with acute exacerbation  Vitamin D deficiency  Gastroesophageal reflux disease without esophagitis  RLS (restless legs syndrome)  Former smoker  ADHD (attention deficit hyperactivity disorder), inattentive type  Depression with anxiety  Essential hypertension: Blood pressure was 112/77 today in the office.   Orders: Orders Placed This Encounter  Procedures   CBC with Differential/Platelet   Protein / creatinine ratio, urine   COMPLETE METABOLIC PANEL WITH GFR   Anti-DNA antibody, double-stranded   C3 and C4   Sedimentation rate   Meds ordered this encounter  Medications   hydroxychloroquine (PLAQUENIL) 200 MG tablet    Sig: Take 1 tablet (200 mg total) by mouth 2 (two) times daily.    Dispense:  180 tablet    Refill:  0    Follow-Up Instructions: Return in about 5 months (around 05/28/2024) for Autoimmune Disease.   Gearldine Bienenstock, PA-C  Note - This record has been created using Dragon software.  Chart creation errors have been sought, but may not always  have been located. Such creation errors do not reflect on  the standard of medical care.

## 2023-12-30 ENCOUNTER — Encounter: Payer: Self-pay | Admitting: Physician Assistant

## 2023-12-30 ENCOUNTER — Other Ambulatory Visit (HOSPITAL_COMMUNITY): Payer: Self-pay

## 2023-12-30 ENCOUNTER — Ambulatory Visit: Payer: Commercial Managed Care - PPO | Attending: Physician Assistant | Admitting: Physician Assistant

## 2023-12-30 VITALS — BP 112/77 | HR 74 | Resp 16 | Ht 65.0 in | Wt 327.0 lb

## 2023-12-30 DIAGNOSIS — M47819 Spondylosis without myelopathy or radiculopathy, site unspecified: Secondary | ICD-10-CM

## 2023-12-30 DIAGNOSIS — Z79899 Other long term (current) drug therapy: Secondary | ICD-10-CM | POA: Diagnosis not present

## 2023-12-30 DIAGNOSIS — K219 Gastro-esophageal reflux disease without esophagitis: Secondary | ICD-10-CM

## 2023-12-30 DIAGNOSIS — Z87891 Personal history of nicotine dependence: Secondary | ICD-10-CM

## 2023-12-30 DIAGNOSIS — R5383 Other fatigue: Secondary | ICD-10-CM | POA: Diagnosis not present

## 2023-12-30 DIAGNOSIS — G8929 Other chronic pain: Secondary | ICD-10-CM

## 2023-12-30 DIAGNOSIS — M533 Sacrococcygeal disorders, not elsewhere classified: Secondary | ICD-10-CM | POA: Diagnosis not present

## 2023-12-30 DIAGNOSIS — J4541 Moderate persistent asthma with (acute) exacerbation: Secondary | ICD-10-CM

## 2023-12-30 DIAGNOSIS — E0842 Diabetes mellitus due to underlying condition with diabetic polyneuropathy: Secondary | ICD-10-CM | POA: Diagnosis not present

## 2023-12-30 DIAGNOSIS — M359 Systemic involvement of connective tissue, unspecified: Secondary | ICD-10-CM

## 2023-12-30 DIAGNOSIS — I10 Essential (primary) hypertension: Secondary | ICD-10-CM

## 2023-12-30 DIAGNOSIS — M25561 Pain in right knee: Secondary | ICD-10-CM | POA: Diagnosis not present

## 2023-12-30 DIAGNOSIS — M79641 Pain in right hand: Secondary | ICD-10-CM | POA: Diagnosis not present

## 2023-12-30 DIAGNOSIS — M7918 Myalgia, other site: Secondary | ICD-10-CM

## 2023-12-30 DIAGNOSIS — M79642 Pain in left hand: Secondary | ICD-10-CM

## 2023-12-30 DIAGNOSIS — E559 Vitamin D deficiency, unspecified: Secondary | ICD-10-CM

## 2023-12-30 DIAGNOSIS — F900 Attention-deficit hyperactivity disorder, predominantly inattentive type: Secondary | ICD-10-CM

## 2023-12-30 DIAGNOSIS — Z8249 Family history of ischemic heart disease and other diseases of the circulatory system: Secondary | ICD-10-CM | POA: Diagnosis not present

## 2023-12-30 DIAGNOSIS — F418 Other specified anxiety disorders: Secondary | ICD-10-CM

## 2023-12-30 DIAGNOSIS — G2581 Restless legs syndrome: Secondary | ICD-10-CM

## 2023-12-30 MED ORDER — HYDROXYCHLOROQUINE SULFATE 200 MG PO TABS
200.0000 mg | ORAL_TABLET | Freq: Two times a day (BID) | ORAL | 0 refills | Status: DC
  Filled 2023-12-30: qty 180, 90d supply, fill #0

## 2024-01-01 LAB — CBC WITH DIFFERENTIAL/PLATELET
Absolute Lymphocytes: 2976 {cells}/uL (ref 850–3900)
Absolute Monocytes: 642 {cells}/uL (ref 200–950)
Basophils Absolute: 37 {cells}/uL (ref 0–200)
Basophils Relative: 0.4 %
Eosinophils Absolute: 140 {cells}/uL (ref 15–500)
Eosinophils Relative: 1.5 %
HCT: 39.1 % (ref 35.0–45.0)
Hemoglobin: 12.2 g/dL (ref 11.7–15.5)
MCH: 27.6 pg (ref 27.0–33.0)
MCHC: 31.2 g/dL — ABNORMAL LOW (ref 32.0–36.0)
MCV: 88.5 fL (ref 80.0–100.0)
MPV: 8.7 fL (ref 7.5–12.5)
Monocytes Relative: 6.9 %
Neutro Abs: 5506 {cells}/uL (ref 1500–7800)
Neutrophils Relative %: 59.2 %
Platelets: 450 10*3/uL — ABNORMAL HIGH (ref 140–400)
RBC: 4.42 10*6/uL (ref 3.80–5.10)
RDW: 14.4 % (ref 11.0–15.0)
Total Lymphocyte: 32 %
WBC: 9.3 10*3/uL (ref 3.8–10.8)

## 2024-01-01 LAB — COMPLETE METABOLIC PANEL WITH GFR
AG Ratio: 1.1 (calc) (ref 1.0–2.5)
ALT: 13 U/L (ref 6–29)
AST: 14 U/L (ref 10–35)
Albumin: 4.3 g/dL (ref 3.6–5.1)
Alkaline phosphatase (APISO): 84 U/L (ref 37–153)
BUN/Creatinine Ratio: 13 (calc) (ref 6–22)
BUN: 16 mg/dL (ref 7–25)
CO2: 30 mmol/L (ref 20–32)
Calcium: 10.4 mg/dL (ref 8.6–10.4)
Chloride: 93 mmol/L — ABNORMAL LOW (ref 98–110)
Creat: 1.2 mg/dL — ABNORMAL HIGH (ref 0.50–1.03)
Globulin: 3.9 g/dL — ABNORMAL HIGH (ref 1.9–3.7)
Glucose, Bld: 106 mg/dL — ABNORMAL HIGH (ref 65–99)
Potassium: 3.4 mmol/L — ABNORMAL LOW (ref 3.5–5.3)
Sodium: 138 mmol/L (ref 135–146)
Total Bilirubin: 0.4 mg/dL (ref 0.2–1.2)
Total Protein: 8.2 g/dL — ABNORMAL HIGH (ref 6.1–8.1)
eGFR: 55 mL/min/{1.73_m2} — ABNORMAL LOW (ref >=60)

## 2024-01-01 LAB — ANTI-DNA ANTIBODY, DOUBLE-STRANDED: ds DNA Ab: <1 [IU]/mL

## 2024-01-01 LAB — PROTEIN / CREATININE RATIO, URINE
Creatinine, Urine: 172 mg/dL (ref 20–275)
Protein/Creat Ratio: 70 mg/g{creat} (ref 24–184)
Protein/Creatinine Ratio: 0.07 mg/mg{creat} (ref 0.024–0.184)
Total Protein, Urine: 12 mg/dL (ref 5–24)

## 2024-01-01 LAB — C3 AND C4
C3 Complement: 219 mg/dL — ABNORMAL HIGH (ref 83–193)
C4 Complement: 67 mg/dL — ABNORMAL HIGH (ref 15–57)

## 2024-01-01 LAB — SEDIMENTATION RATE: Sed Rate: 87 mm/h — ABNORMAL HIGH (ref 0–30)

## 2024-01-03 NOTE — Progress Notes (Signed)
 Platelet count is elevated-450K.  Rest of CBC WNL.   Creatinine is elevated-1.20 and GFR is low-55.  Please clarify if she has been taking any NSAIDs? Potassium is low-3.4.  Total protein is slightly elevated.   Urine protein creatinine ratio WNL.  C3 and C4 slightly elevated. ESR remains elevated.  dsDNA is negative.

## 2024-01-12 ENCOUNTER — Other Ambulatory Visit: Payer: Self-pay

## 2024-01-12 ENCOUNTER — Other Ambulatory Visit (HOSPITAL_COMMUNITY): Payer: Self-pay

## 2024-01-13 ENCOUNTER — Other Ambulatory Visit: Payer: Self-pay | Admitting: Family Medicine

## 2024-01-13 ENCOUNTER — Other Ambulatory Visit: Payer: Self-pay | Admitting: Physician Assistant

## 2024-01-13 DIAGNOSIS — M359 Systemic involvement of connective tissue, unspecified: Secondary | ICD-10-CM

## 2024-01-13 DIAGNOSIS — E0842 Diabetes mellitus due to underlying condition with diabetic polyneuropathy: Secondary | ICD-10-CM

## 2024-01-13 DIAGNOSIS — M51369 Other intervertebral disc degeneration, lumbar region without mention of lumbar back pain or lower extremity pain: Secondary | ICD-10-CM

## 2024-01-14 ENCOUNTER — Other Ambulatory Visit: Payer: Self-pay

## 2024-01-14 ENCOUNTER — Other Ambulatory Visit (HOSPITAL_COMMUNITY): Payer: Self-pay

## 2024-01-14 MED ORDER — CYCLOBENZAPRINE HCL 10 MG PO TABS
10.0000 mg | ORAL_TABLET | Freq: Every day | ORAL | 0 refills | Status: DC
  Filled 2024-01-14: qty 30, 30d supply, fill #0

## 2024-01-14 MED ORDER — ONETOUCH VERIO VI STRP: ORAL_STRIP | 12 refills | Status: AC

## 2024-01-14 MED ORDER — CLOTRIMAZOLE-BETAMETHASONE 1-0.05 % EX CREA
1.0000 | TOPICAL_CREAM | Freq: Every day | CUTANEOUS | 0 refills | Status: DC
  Filled 2024-01-14: qty 45, 30d supply, fill #0

## 2024-01-17 ENCOUNTER — Other Ambulatory Visit (HOSPITAL_COMMUNITY): Payer: Self-pay

## 2024-01-17 ENCOUNTER — Other Ambulatory Visit: Payer: Self-pay

## 2024-01-17 MED ORDER — TRAMADOL-ACETAMINOPHEN 37.5-325 MG PO TABS
1.0000 | ORAL_TABLET | Freq: Four times a day (QID) | ORAL | 0 refills | Status: DC | PRN
  Filled 2024-01-17: qty 30, 8d supply, fill #0

## 2024-01-18 ENCOUNTER — Other Ambulatory Visit (HOSPITAL_COMMUNITY): Payer: Self-pay

## 2024-01-18 ENCOUNTER — Other Ambulatory Visit: Payer: Self-pay

## 2024-01-21 ENCOUNTER — Ambulatory Visit (INDEPENDENT_AMBULATORY_CARE_PROVIDER_SITE_OTHER): Payer: Commercial Managed Care - PPO | Admitting: Family Medicine

## 2024-01-21 ENCOUNTER — Encounter: Payer: Self-pay | Admitting: Family Medicine

## 2024-01-21 ENCOUNTER — Other Ambulatory Visit (HOSPITAL_COMMUNITY): Payer: Self-pay

## 2024-01-21 VITALS — BP 124/80 | HR 72 | Ht 64.17 in | Wt 321.0 lb

## 2024-01-21 DIAGNOSIS — Z7985 Long-term (current) use of injectable non-insulin antidiabetic drugs: Secondary | ICD-10-CM | POA: Diagnosis not present

## 2024-01-21 DIAGNOSIS — E559 Vitamin D deficiency, unspecified: Secondary | ICD-10-CM

## 2024-01-21 DIAGNOSIS — E1142 Type 2 diabetes mellitus with diabetic polyneuropathy: Secondary | ICD-10-CM | POA: Diagnosis not present

## 2024-01-21 DIAGNOSIS — Z0000 Encounter for general adult medical examination without abnormal findings: Secondary | ICD-10-CM

## 2024-01-21 DIAGNOSIS — Z7984 Long term (current) use of oral hypoglycemic drugs: Secondary | ICD-10-CM

## 2024-01-21 LAB — CBC WITH DIFFERENTIAL/PLATELET
Basophils Absolute: 0 10*3/uL (ref 0.0–0.1)
Basophils Relative: 0.4 % (ref 0.0–3.0)
Eosinophils Absolute: 0.2 10*3/uL (ref 0.0–0.7)
Eosinophils Relative: 2.2 % (ref 0.0–5.0)
HCT: 34.3 % — ABNORMAL LOW (ref 36.0–46.0)
Hemoglobin: 11.4 g/dL — ABNORMAL LOW (ref 12.0–15.0)
Lymphocytes Relative: 43.4 % (ref 12.0–46.0)
Lymphs Abs: 3.4 10*3/uL (ref 0.7–4.0)
MCHC: 33.1 g/dL (ref 30.0–36.0)
MCV: 86.5 fl (ref 78.0–100.0)
Monocytes Absolute: 0.6 10*3/uL (ref 0.1–1.0)
Monocytes Relative: 8.1 % (ref 3.0–12.0)
Neutro Abs: 3.6 10*3/uL (ref 1.4–7.7)
Neutrophils Relative %: 45.9 % (ref 43.0–77.0)
Platelets: 452 10*3/uL — ABNORMAL HIGH (ref 150.0–400.0)
RBC: 3.97 Mil/uL (ref 3.87–5.11)
RDW: 15.3 % (ref 11.5–15.5)
WBC: 7.9 10*3/uL (ref 4.0–10.5)

## 2024-01-21 LAB — HEPATIC FUNCTION PANEL
ALT: 15 U/L (ref 0–35)
AST: 17 U/L (ref 0–37)
Albumin: 4.2 g/dL (ref 3.5–5.2)
Alkaline Phosphatase: 75 U/L (ref 39–117)
Bilirubin, Direct: 0.1 mg/dL (ref 0.0–0.3)
Total Bilirubin: 0.4 mg/dL (ref 0.2–1.2)
Total Protein: 8.3 g/dL (ref 6.0–8.3)

## 2024-01-21 LAB — VITAMIN D 25 HYDROXY (VIT D DEFICIENCY, FRACTURES): VITD: 33.45 ng/mL (ref 30.00–100.00)

## 2024-01-21 LAB — MAGNESIUM: Magnesium: 2 mg/dL (ref 1.5–2.5)

## 2024-01-21 LAB — LIPID PANEL
Cholesterol: 134 mg/dL (ref 0–200)
HDL: 50.8 mg/dL (ref >39.00)
LDL Cholesterol: 71 mg/dL (ref 0–99)
NonHDL: 83.5
Total CHOL/HDL Ratio: 3
Triglycerides: 62 mg/dL (ref 0.0–149.0)
VLDL: 12.4 mg/dL (ref 0.0–40.0)

## 2024-01-21 LAB — BASIC METABOLIC PANEL
BUN: 15 mg/dL (ref 6–23)
CO2: 33 meq/L — ABNORMAL HIGH (ref 19–32)
Calcium: 10 mg/dL (ref 8.4–10.5)
Chloride: 94 meq/L — ABNORMAL LOW (ref 96–112)
Creatinine, Ser: 1.18 mg/dL (ref 0.40–1.20)
GFR: 53.58 mL/min — ABNORMAL LOW (ref >60.00)
Glucose, Bld: 92 mg/dL (ref 70–99)
Potassium: 2.9 meq/L — ABNORMAL LOW (ref 3.5–5.1)
Sodium: 138 meq/L (ref 135–145)

## 2024-01-21 LAB — HEMOGLOBIN A1C: Hgb A1c MFr Bld: 6.5 % (ref 4.6–6.5)

## 2024-01-21 LAB — TSH: TSH: 4.02 u[IU]/mL (ref 0.35–5.50)

## 2024-01-21 NOTE — Patient Instructions (Addendum)
Follow up in 3-4 months to recheck diabetes We'll notify you of your lab results and make any changes if needed Continue to work on healthy diet and regular exercise- you can do it! Call with any questions or concerns Stay Safe!  Stay Healthy! Hang in there!!!

## 2024-01-21 NOTE — Progress Notes (Signed)
   Subjective:    Patient ID: Vanessa Reeves, female    DOB: May 25, 1971, 53 y.o.   MRN: [id number]  HPI CPE- declines vaccines.  UTD on eye exam, mammogram, microalbumin, colonoscopy.  Due for foot exam.  Patient Care Team    Relationship Specialty Notifications Start End  Sheliah Hatch, MD PCP - General Family Medicine  07/17/13    Comment: Josephine Cables, OD Consulting Physician Optometry  11/26/15   Lenda Kelp, MD Consulting Physician Sports Medicine  11/26/15   Glendale Chard, DO Consulting Physician Neurology  11/26/15   Gearldine Bienenstock, PA-C Physician Assistant Physician Assistant  08/19/23      Health Maintenance  Topic Date Due   Zoster Vaccines- Shingrix (1 of 2) Never done   Pneumococcal Vaccine 46-26 Years old (2 of 2 - PCV) 01/16/2016   COVID-19 Vaccine (3 - Moderna risk series) 08/01/2020   OPHTHALMOLOGY EXAM  04/05/2024   MAMMOGRAM  04/15/2024   HEMOGLOBIN A1C  04/19/2024   Diabetic kidney evaluation - eGFR measurement  12/29/2024   Diabetic kidney evaluation - Urine ACR  12/29/2024   FOOT EXAM  01/20/2025   DTaP/Tdap/Td (2 - Td or Tdap) 10/13/2025   Colonoscopy  07/10/2031   INFLUENZA VACCINE  Completed   Hepatitis C Screening  Completed   HIV Screening  Completed   HPV VACCINES  Aged Out      Review of Systems Patient reports no vision/ hearing changes, adenopathy,fever, weight change,  persistant/recurrent hoarseness , swallowing issues, chest pain, palpitations, edema, persistant/recurrent cough, hemoptysis, dyspnea (rest/exertional/paroxysmal nocturnal), gastrointestinal bleeding (melena, rectal bleeding), abdominal pain, significant heartburn, bowel changes, GU symptoms (dysuria, hematuria, incontinence), Gyn symptoms (abnormal  bleeding, pain),  syncope, focal weakness, memory loss, numbness & tingling, skin/hair/nail changes, abnormal bruising or bleeding, anxiety, or depression.     Objective:   Physical Exam General Appearance:     Alert, cooperative, no distress, appears stated age, obese  Head:    Normocephalic, without obvious abnormality, atraumatic  Eyes:    PERRL, conjunctiva/corneas clear, EOM's intact both eyes  Ears:    Normal TM's and external ear canals, both ears  Nose:   Nares normal, septum midline, mucosa normal, no drainage    or sinus tenderness  Throat:   Lips, mucosa, and tongue normal; teeth and gums normal  Neck:   Supple, symmetrical, trachea midline, no adenopathy;    Thyroid: no enlargement/tenderness/nodules  Back:     Symmetric, no curvature, ROM normal, no CVA tenderness  Lungs:     Clear to auscultation bilaterally, respirations unlabored  Chest Wall:    No tenderness or deformity   Heart:    Regular rate and rhythm, S1 and S2 normal, no murmur, rub   or gallop  Breast Exam:    Deferred to GYN  Abdomen:     Soft, non-tender, bowel sounds active all four quadrants,    no masses, no organomegaly  Genitalia:    Deferred to GYN  Rectal:    Extremities:   Extremities normal, atraumatic, no cyanosis or edema  Pulses:   2+ and symmetric all extremities  Skin:   Skin color, texture, turgor normal, no rashes or lesions  Lymph nodes:   Cervical, supraclavicular, and axillary nodes normal  Neurologic:   CNII-XII intact, normal strength, sensation and reflexes    throughout          Assessment & Plan:

## 2024-01-21 NOTE — Assessment & Plan Note (Signed)
Chronic problem.  UTD on eye exam, microalbumin.  Foot exam done today.  Check labs.  Adjust meds prn

## 2024-01-21 NOTE — Assessment & Plan Note (Signed)
 Pt's PE WNL w/ exception of BMI.  UTD on colonoscopy, mammo.  Check labs.  Anticipatory guidance provided.

## 2024-01-22 ENCOUNTER — Encounter: Payer: Self-pay | Admitting: Family Medicine

## 2024-01-22 LAB — IRON,TIBC AND FERRITIN PANEL
%SAT: 14 % — ABNORMAL LOW (ref 16–45)
Ferritin: 162 ng/mL (ref 16–232)
Iron: 41 ug/dL — ABNORMAL LOW (ref 45–160)
TIBC: 298 ug/dL (ref 250–450)

## 2024-01-24 ENCOUNTER — Other Ambulatory Visit (HOSPITAL_COMMUNITY): Payer: Self-pay

## 2024-01-24 ENCOUNTER — Other Ambulatory Visit: Payer: Self-pay

## 2024-01-24 ENCOUNTER — Other Ambulatory Visit (HOSPITAL_BASED_OUTPATIENT_CLINIC_OR_DEPARTMENT_OTHER): Payer: Self-pay

## 2024-01-24 DIAGNOSIS — E876 Hypokalemia: Secondary | ICD-10-CM

## 2024-01-24 MED ORDER — FERROUS SULFATE 325 (65 FE) MG PO TABS
325.0000 mg | ORAL_TABLET | Freq: Every day | ORAL | 3 refills | Status: DC
  Filled 2024-01-24: qty 30, 30d supply, fill #0
  Filled 2024-03-11: qty 30, 30d supply, fill #1
  Filled 2024-05-21 – 2024-05-22 (×2): qty 30, 30d supply, fill #2
  Filled 2024-06-28: qty 30, 30d supply, fill #3

## 2024-01-24 MED ORDER — POTASSIUM CHLORIDE CRYS ER 20 MEQ PO TBCR
20.0000 meq | EXTENDED_RELEASE_TABLET | Freq: Every day | ORAL | 1 refills | Status: AC
  Filled 2024-01-24: qty 90, 90d supply, fill #0

## 2024-01-24 MED ORDER — SEMAGLUTIDE (1 MG/DOSE) 4 MG/3ML ~~LOC~~ SOPN
1.0000 mg | PEN_INJECTOR | SUBCUTANEOUS | 1 refills | Status: DC
  Filled 2024-01-24: qty 3, 28d supply, fill #0
  Filled 2024-02-08 – 2024-02-16 (×2): qty 3, 28d supply, fill #1

## 2024-01-24 NOTE — Telephone Encounter (Signed)
 See mychart message Refill have been sent.

## 2024-02-01 ENCOUNTER — Encounter: Payer: Self-pay | Admitting: Family Medicine

## 2024-02-01 NOTE — Telephone Encounter (Signed)
 Can you please confirm you are taking Tatyana back as a patient

## 2024-02-01 NOTE — Telephone Encounter (Signed)
 Can you guys help me with this

## 2024-02-08 ENCOUNTER — Other Ambulatory Visit (HOSPITAL_COMMUNITY): Payer: Self-pay

## 2024-02-21 ENCOUNTER — Encounter: Payer: Self-pay | Admitting: Family Medicine

## 2024-02-24 ENCOUNTER — Ambulatory Visit: Admitting: Family Medicine

## 2024-02-28 ENCOUNTER — Ambulatory Visit: Admitting: Family Medicine

## 2024-03-11 ENCOUNTER — Other Ambulatory Visit (HOSPITAL_BASED_OUTPATIENT_CLINIC_OR_DEPARTMENT_OTHER): Payer: Self-pay

## 2024-03-11 ENCOUNTER — Other Ambulatory Visit (HOSPITAL_COMMUNITY): Payer: Self-pay

## 2024-03-11 ENCOUNTER — Other Ambulatory Visit: Payer: Self-pay | Admitting: Family Medicine

## 2024-03-13 ENCOUNTER — Other Ambulatory Visit: Payer: Self-pay

## 2024-03-13 ENCOUNTER — Other Ambulatory Visit (HOSPITAL_COMMUNITY): Payer: Self-pay

## 2024-03-13 MED ORDER — OZEMPIC (1 MG/DOSE) 4 MG/3ML ~~LOC~~ SOPN
1.0000 mg | PEN_INJECTOR | SUBCUTANEOUS | 1 refills | Status: DC
  Filled 2024-03-13: qty 3, 28d supply, fill #0
  Filled 2024-04-12: qty 3, 28d supply, fill #1

## 2024-03-24 ENCOUNTER — Ambulatory Visit: Admitting: Physician Assistant

## 2024-03-24 ENCOUNTER — Encounter: Payer: Self-pay | Admitting: Physician Assistant

## 2024-03-24 DIAGNOSIS — M1711 Unilateral primary osteoarthritis, right knee: Secondary | ICD-10-CM

## 2024-03-24 MED ORDER — LIDOCAINE HCL 1 % IJ SOLN
3.0000 mL | INTRAMUSCULAR | Status: AC | PRN
  Administered 2024-03-24: 3 mL

## 2024-03-24 MED ORDER — METHYLPREDNISOLONE ACETATE 40 MG/ML IJ SUSP
40.0000 mg | INTRAMUSCULAR | Status: AC | PRN
Start: 2024-03-24 — End: 2024-03-24
  Administered 2024-03-24: 40 mg via INTRA_ARTICULAR

## 2024-03-24 NOTE — Progress Notes (Signed)
 Office Visit Note   Patient: Vanessa Reeves           Date of Birth: [birth year]           MRN: [id number] Visit Date: 03/24/2024              Requested by: Jess Morita, MD [id number] [place],  [place] [address] PCP: Jess Morita, MD  Chief Complaint  Patient presents with  . Right Knee - Pain      HPI: Patient is a pleasant 53 year old woman her history of arthritis in her right knee.  She periodically gets injections and does quite well requesting injection today no new injury she also feels unsteady on her knee when she walks long distances she is wondering if she can get a parking sticker  Assessment & Plan: Visit Diagnoses: Right knee pain  Plan: Shelah Derry forward with an injection today.  She does have difficulty without an assistive device of the orthopedic and her knee pain.  Have provided her with 40-month handicap parking sticker can reevaluate in 6 months for renewal  Follow-Up Instructions: Return if symptoms worsen or fail to improve.   Ortho Exam  Patient is alert, oriented, no adenopathy, well-dressed, normal affect, normal respiratory effort. Right knee no effusion no erythema compartments are soft and compressible she is neurovascularly intact  Imaging: No results found. No images are attached to the encounter.  Labs: Lab Results  Component Value Date   HGBA1C 6.5 01/21/2024   HGBA1C 8.3 (H) 10/20/2023   HGBA1C 7.0 (H) 05/31/2023   ESRSEDRATE 87 (H) 12/30/2023   ESRSEDRATE 53 (H) 08/24/2023   ESRSEDRATE 103 (H) 05/31/2023   CRP 26.1 (H) 08/24/2023   REPTSTATUS 07/30/2009 FINAL 07/29/2009   CULT  07/29/2009    Multiple bacterial morphotypes present, none predominant. Suggest appropriate recollection if clinically indicated.   LABORGA NO GROWTH 03/30/2014     Lab Results  Component Value Date   ALBUMIN 4.2 01/21/2024   ALBUMIN 3.8 10/20/2023   ALBUMIN 3.9 01/20/2023    Lab Results  Component Value Date   MG 2.0 01/21/2024    Lab Results  Component Value Date   VD25OH 33.45 01/21/2024   VD25OH 15.65 (L) 01/20/2023   VD25OH 36 07/14/2022    No results found for: "PREALBUMIN"    Latest Ref Rng & Units 01/21/2024    9:07 AM 12/30/2023    8:30 AM 10/20/2023    9:13 AM  CBC EXTENDED  WBC 4.0 - 10.5 K/uL 7.9  9.3  6.1   RBC 3.87 - 5.11 Mil/uL 3.97  4.42  4.19   Hemoglobin 12.0 - 15.0 g/dL 84.6  96.2  95.2   HCT 36.0 - 46.0 % 34.3  39.1  36.1   Platelets 150.0 - 400.0 K/uL 452.0  450  367.0   NEUT# 1.4 - 7.7 K/uL 3.6  5,506  3.3   Lymph# 0.7 - 4.0 K/uL 3.4   2.2      There is no height or weight on file to calculate BMI.  Orders:  No orders of the defined types were placed in this encounter.  No orders of the defined types were placed in this encounter.    Procedures: Large Joint Inj on 03/24/2024 8:49 AM Indications: pain and diagnostic evaluation Details: 25 G 1.5 in needle, anteromedial approach  Arthrogram: No  Medications: 40 mg methylPREDNISolone  acetate 40 MG/ML; 3 mL lidocaine  1 % Outcome: tolerated well, no immediate complications Procedure,  treatment alternatives, risks and benefits explained, specific risks discussed. Consent was given by the patient.    Clinical Data: No additional findings.  ROS:  All other systems negative, except as noted in the HPI. Review of Systems  Objective: Vital Signs: LMP 12/04/2011   Specialty Comments:  No specialty comments available.  PMFS History: Patient Active Problem List   Diagnosis Date Noted  . Pain in right knee 09/24/2023  . Autoimmune disease (HCC) 08/19/2023  . Polyp of transverse colon   . Polyp of sigmoid colon   . Polyp of rectum   . Hearing loss of right ear 09/04/2020  . Hyperlipidemia associated with type 2 diabetes mellitus (HCC) 02/21/2020  . Insomnia 02/21/2020  . Vitamin D  deficiency 02/21/2020  . Degenerative disc disease, lumbar 02/21/2020  . DM (diabetes mellitus) type II controlled, neurological  manifestation (HCC) 06/14/2019  . Special screening for malignant neoplasms, colon 04/11/2018  . Memory changes 11/23/2017  . RLS (restless legs syndrome) 03/17/2017  . 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 Reactive airway disease 08/21/2016  . Gastroesophageal reflux disease without esophagitis 12/20/2015  . Physical exam 12/14/2014  . Breast mass, left 02/16/2014  . Chest pain 12/29/2013  . Depression with anxiety 07/17/2013  . Essential hypertension 07/17/2013  . Eustachian tube dysfunction 07/17/2013  . Adult BMI 50.0-59.9 kg/sq m (HCC) 07/17/2013   Past Medical History:  Diagnosis Date  . Allergy   . Anxiety   . Arthritis    HANDS  . Asthma    HAS MDI  . Complication of anesthesia   . Depression   . Diabetes mellitus without complication (HCC)   . Difficult airway for intubation    2013 used Video Laryngoscope  . GERD (gastroesophageal reflux disease)   . Herpes   . Hyperlipidemia   . Hypertension   . RLS (restless legs syndrome) 03/17/2017   Right leg  . Special screening for malignant neoplasms, colon 04/11/2018    Family History  Problem Relation Age of Onset  . Hypertension Mother   . Clotting disorder Mother   . Stroke Father   . Thrombosis Father   . Congestive Heart Failure Father   . Hypertension Paternal Grandmother   . Diabetes Mellitus II Paternal Grandmother   . Lupus Paternal Grandmother   . Healthy Daughter   . Healthy Son   . Colon cancer Neg Hx   . Esophageal cancer Neg Hx   . Pancreatic cancer Neg Hx   . Stomach cancer Neg Hx   . Liver disease Neg Hx   . Colon polyps Neg Hx     Past Surgical History:  Procedure Laterality Date  . COLONOSCOPY WITH PROPOFOL  N/A 07/09/2021   Procedure: COLONOSCOPY WITH PROPOFOL ;  Surgeon: Sergio Dandy, MD;  Location: [facility];  Service: Endoscopy;  Laterality: N/A;  . DILATION AND CURETTAGE OF UTERUS    . POLYPECTOMY  07/09/2021   Procedure:  POLYPECTOMY;  Surgeon: Sergio Dandy, MD;  Location: [facility];  Service: Endoscopy;;  . TOTAL ABDOMINAL HYSTERECTOMY    . TUBAL LIGATION     Social History   Occupational History  . Not on file  Tobacco Use  . Smoking status: Former    Types: Cigarettes    Passive exposure: Never  . Smokeless tobacco: Never  Vaping Use  . Vaping status: Never Used  Substance and Sexual Activity  . Alcohol use: Not Currently  . Drug use: Not Currently    Types: Marijuana  .  Sexual activity: Yes    Birth control/protection: Surgical

## 2024-04-05 ENCOUNTER — Other Ambulatory Visit (HOSPITAL_COMMUNITY): Payer: Self-pay

## 2024-04-10 ENCOUNTER — Ambulatory Visit: Admitting: Family Medicine

## 2024-04-12 ENCOUNTER — Other Ambulatory Visit (HOSPITAL_COMMUNITY): Payer: Self-pay

## 2024-04-13 ENCOUNTER — Ambulatory Visit: Admitting: Physician Assistant

## 2024-04-17 ENCOUNTER — Ambulatory Visit: Admitting: Physician Assistant

## 2024-04-17 ENCOUNTER — Other Ambulatory Visit: Payer: Self-pay | Admitting: Family Medicine

## 2024-04-17 ENCOUNTER — Other Ambulatory Visit (HOSPITAL_COMMUNITY): Payer: Self-pay

## 2024-04-17 DIAGNOSIS — M51369 Other intervertebral disc degeneration, lumbar region without mention of lumbar back pain or lower extremity pain: Secondary | ICD-10-CM

## 2024-04-17 MED ORDER — CYCLOBENZAPRINE HCL 10 MG PO TABS
10.0000 mg | ORAL_TABLET | Freq: Every day | ORAL | 0 refills | Status: DC
  Filled 2024-04-17: qty 30, 30d supply, fill #0

## 2024-04-17 MED ORDER — TRAMADOL-ACETAMINOPHEN 37.5-325 MG PO TABS
1.0000 | ORAL_TABLET | Freq: Four times a day (QID) | ORAL | 0 refills | Status: DC | PRN
  Filled 2024-04-17: qty 30, 8d supply, fill #0

## 2024-04-18 ENCOUNTER — Ambulatory Visit: Admitting: Physician Assistant

## 2024-04-18 DIAGNOSIS — M79661 Pain in right lower leg: Secondary | ICD-10-CM

## 2024-04-18 NOTE — Progress Notes (Signed)
 Office Visit Note   Patient: Vanessa Reeves           Date of Birth: 1971-02-04           MRN: [id number] Visit Date: 04/18/2024              Requested by: Jess Morita, MD [phone]  [place] [address] PCP: Jess Morita, MD  No chief complaint on file.     HPI: Vanessa Reeves is a pleasant 53 year old woman who works as a Engineer, civil (consulting) at [employer].  She has had ongoing issues with her right knee.  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  She complains of pain especially when going up and down stairs and grinding in the front of her knee.  She also has quite a bit of pain and swelling when she gets home no fever or chills she does have a history of lupus  Assessment & Plan: Visit Diagnoses:  1. 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  She did get some help from injections but have not worked recently.  Have given her some close chain strengthening exercises to do and demonstrated to her and her family how to do them should follow-up in a month.  Should also try different topicals such as Voltaren gel.  She cannot take oral anti-inflammatories because of her you lupus medication  Follow-Up Instructions: Return in about 1 month (around 05/18/2024).   Ortho Exam  Patient is alert, oriented, no adenopathy, well-dressed, normal affect, normal respiratory effort. Examination of her right knee no erythema no effusion compartments are soft and compressible she is neurovascularly intact tender around the medial border of the patella.  Significant grinding with extension and flexion    Imaging: No results found. No images are attached to the encounter.  Labs: Lab Results  Component Value Date   HGBA1C 6.5 01/21/2024   HGBA1C 8.3 (H) 10/20/2023   HGBA1C 7.0 (H) 05/31/2023   ESRSEDRATE 87 (H) 12/30/2023   ESRSEDRATE 53 (H)  08/24/2023   ESRSEDRATE 103 (H) 05/31/2023   CRP 26.1 (H) 08/24/2023   REPTSTATUS 07/30/2009 FINAL 07/29/2009   CULT  07/29/2009    Multiple bacterial morphotypes present, none predominant. Suggest appropriate recollection if clinically indicated.   LABORGA NO GROWTH 03/30/2014     Lab Results  Component Value Date   ALBUMIN 4.2 01/21/2024   ALBUMIN 3.8 10/20/2023   ALBUMIN 3.9 01/20/2023    Lab Results  Component Value Date   MG 2.0 01/21/2024   Lab Results  Component Value Date   VD25OH 33.45 01/21/2024   VD25OH 15.65 (L) 01/20/2023   VD25OH 36 07/14/2022    No results found for: PREALBUMIN    Latest Ref Rng & Units 01/21/2024    9:07 AM 12/30/2023    8:30 AM 10/20/2023    9:13 AM  CBC EXTENDED  WBC 4.0 - 10.5 K/uL 7.9  9.3  6.1   RBC 3.87 - 5.11 Mil/uL 3.97  4.42  4.19   Hemoglobin 12.0 - 15.0 g/dL 78.2  95.6  21.3   HCT 36.0 - 46.0 % 34.3  39.1  36.1   Platelets 150.0 - 400.0 K/uL 452.0  450  367.0   NEUT# 1.4 - 7.7 K/uL 3.6  5,506  3.3   Lymph# 0.7 - 4.0 K/uL 3.4   2.2  There is no height or weight on file to calculate BMI.  Orders:  No orders of the defined types were placed in this encounter.  No orders of the defined types were placed in this encounter.    Procedures: No procedures performed  Clinical Data: No additional findings.  ROS:  All other systems negative, except as noted in the HPI. Review of Systems  Objective: Vital Signs: LMP 12/04/2011   Specialty Comments:  No specialty comments available.  PMFS History: Patient Active Problem List   Diagnosis Date Noted   Pain in right knee 09/24/2023   Autoimmune disease (HCC) 08/19/2023   Polyp of transverse colon    Polyp of sigmoid colon    Polyp of rectum    Hearing loss of right ear 09/04/2020   Hyperlipidemia associated with type 2 diabetes mellitus (HCC) 02/21/2020   Insomnia 02/21/2020   Vitamin D  deficiency 02/21/2020   Degenerative disc disease, lumbar 02/21/2020    DM (diabetes mellitus) type II controlled, neurological manifestation (HCC) 06/14/2019   Special screening for malignant neoplasms, colon 04/11/2018   Memory changes 11/23/2017   RLS (restless legs syndrome) 03/17/2017   ADHD (attention deficit hyperactivity disorder), 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   Reactive airway disease 08/21/2016   Gastroesophageal reflux disease without esophagitis 12/20/2015   Physical exam 12/14/2014   Breast mass, left 02/16/2014   Chest pain 12/29/2013   Depression with anxiety 07/17/2013   Essential hypertension 07/17/2013   Eustachian tube dysfunction 07/17/2013   Adult BMI 50.0-59.9 kg/sq m (HCC) 07/17/2013   Past Medical History:  Diagnosis Date   Allergy    Anxiety    Arthritis    HANDS   Asthma    HAS MDI   Complication of anesthesia    Depression    Diabetes mellitus without complication (HCC)    Difficult airway for intubation    2013 used Video Laryngoscope   GERD (gastroesophageal reflux disease)    Herpes    Hyperlipidemia    Hypertension    RLS (restless legs syndrome) 03/17/2017   Right leg   Special screening for malignant neoplasms, colon 04/11/2018    Family History  Problem Relation Age of Onset   Hypertension Mother    Clotting disorder Mother    Stroke Father    Thrombosis Father    Congestive Heart Failure Father    Hypertension Paternal Grandmother    Diabetes Mellitus II Paternal Grandmother    Lupus Paternal Grandmother    Healthy Daughter    Healthy Son    Colon cancer Neg Hx    Esophageal cancer Neg Hx    Pancreatic cancer Neg Hx    Stomach cancer Neg Hx    Liver disease Neg Hx    Colon polyps Neg Hx     Past Surgical History:  Procedure Laterality Date   COLONOSCOPY WITH PROPOFOL  N/A 07/09/2021   Procedure: COLONOSCOPY WITH PROPOFOL ;  Surgeon: Nandigam, Kavitha V, MD;  Location: [facility];  Service: Endoscopy;  Laterality: N/A;   DILATION AND CURETTAGE OF UTERUS     POLYPECTOMY  07/09/2021    Procedure: POLYPECTOMY;  Surgeon: Sergio Dandy, MD;  Location: [facility];  Service: Endoscopy;;   TOTAL ABDOMINAL HYSTERECTOMY     TUBAL LIGATION     Social History   Occupational History   Not on file  Tobacco Use   Smoking status: Former    Types: Cigarettes    Passive exposure: Never   Smokeless tobacco: Never  Vaping Use  Vaping status: Never Used  Substance and Sexual Activity   Alcohol use: Not Currently   Drug use: Not Currently    Types: Marijuana   Sexual activity: Yes    Birth control/protection: Surgical

## 2024-04-19 DIAGNOSIS — Z79899 Other long term (current) drug therapy: Secondary | ICD-10-CM

## 2024-04-19 DIAGNOSIS — M359 Systemic involvement of connective tissue, unspecified: Secondary | ICD-10-CM

## 2024-04-21 ENCOUNTER — Ambulatory Visit: Admitting: Family Medicine

## 2024-04-24 ENCOUNTER — Other Ambulatory Visit (HOSPITAL_COMMUNITY): Payer: Self-pay

## 2024-04-27 ENCOUNTER — Encounter: Payer: Self-pay | Admitting: Family Medicine

## 2024-04-27 ENCOUNTER — Encounter (HOSPITAL_COMMUNITY): Payer: Self-pay

## 2024-04-27 ENCOUNTER — Other Ambulatory Visit (HOSPITAL_COMMUNITY): Payer: Self-pay

## 2024-04-27 ENCOUNTER — Ambulatory Visit: Admitting: Family Medicine

## 2024-04-27 VITALS — BP 116/70 | HR 82 | Temp 97.9°F | Ht 64.17 in | Wt 317.8 lb

## 2024-04-27 DIAGNOSIS — E1142 Type 2 diabetes mellitus with diabetic polyneuropathy: Secondary | ICD-10-CM | POA: Diagnosis not present

## 2024-04-27 DIAGNOSIS — Z7985 Long-term (current) use of injectable non-insulin antidiabetic drugs: Secondary | ICD-10-CM | POA: Diagnosis not present

## 2024-04-27 DIAGNOSIS — M359 Systemic involvement of connective tissue, unspecified: Secondary | ICD-10-CM | POA: Diagnosis not present

## 2024-04-27 DIAGNOSIS — Z7984 Long term (current) use of oral hypoglycemic drugs: Secondary | ICD-10-CM | POA: Diagnosis not present

## 2024-04-27 DIAGNOSIS — Z79899 Other long term (current) drug therapy: Secondary | ICD-10-CM

## 2024-04-27 LAB — HEPATIC FUNCTION PANEL
ALT: 11 U/L (ref 0–35)
AST: 13 U/L (ref 0–37)
Albumin: 3.9 g/dL (ref 3.5–5.2)
Alkaline Phosphatase: 73 U/L (ref 39–117)
Bilirubin, Direct: 0 mg/dL (ref 0.0–0.3)
Total Bilirubin: 0.3 mg/dL (ref 0.2–1.2)
Total Protein: 7.8 g/dL (ref 6.0–8.3)

## 2024-04-27 LAB — BASIC METABOLIC PANEL WITH GFR
BUN: 13 mg/dL (ref 6–23)
CO2: 33 meq/L — ABNORMAL HIGH (ref 19–32)
Calcium: 9.3 mg/dL (ref 8.4–10.5)
Chloride: 99 meq/L (ref 96–112)
Creatinine, Ser: 0.94 mg/dL (ref 0.40–1.20)
GFR: 70.25 mL/min (ref >60.00)
Glucose, Bld: 107 mg/dL — ABNORMAL HIGH (ref 70–99)
Potassium: 3.7 meq/L (ref 3.5–5.1)
Sodium: 138 meq/L (ref 135–145)

## 2024-04-27 LAB — CBC WITH DIFFERENTIAL/PLATELET
Basophils Absolute: 0 10*3/uL (ref 0.0–0.1)
Basophils Relative: 0.1 % (ref 0.0–3.0)
Eosinophils Absolute: 0.1 10*3/uL (ref 0.0–0.7)
Eosinophils Relative: 2.6 % (ref 0.0–5.0)
HCT: 34.1 % — ABNORMAL LOW (ref 36.0–46.0)
Hemoglobin: 11.2 g/dL — ABNORMAL LOW (ref 12.0–15.0)
Lymphocytes Relative: 42.9 % (ref 12.0–46.0)
Lymphs Abs: 2.2 10*3/uL (ref 0.7–4.0)
MCHC: 32.8 g/dL (ref 30.0–36.0)
MCV: 86.7 fl (ref 78.0–100.0)
Monocytes Absolute: 0.4 10*3/uL (ref 0.1–1.0)
Monocytes Relative: 7.6 % (ref 3.0–12.0)
Neutro Abs: 2.4 10*3/uL (ref 1.4–7.7)
Neutrophils Relative %: 46.8 % (ref 43.0–77.0)
Platelets: 394 10*3/uL (ref 150.0–400.0)
RBC: 3.93 Mil/uL (ref 3.87–5.11)
RDW: 15.6 % — ABNORMAL HIGH (ref 11.5–15.5)
WBC: 5.2 10*3/uL (ref 4.0–10.5)

## 2024-04-27 LAB — HEMOGLOBIN A1C: Hgb A1c MFr Bld: 6.1 % (ref 4.6–6.5)

## 2024-04-27 LAB — MICROALBUMIN / CREATININE URINE RATIO
Creatinine,U: 378.9 mg/dL
Microalb Creat Ratio: 3.3 mg/g (ref 0.0–30.0)
Microalb, Ur: 1.3 mg/dL (ref 0.0–1.9)

## 2024-04-27 LAB — SEDIMENTATION RATE: Sed Rate: 75 mm/h — ABNORMAL HIGH (ref 0–30)

## 2024-04-27 MED ORDER — SEMAGLUTIDE (2 MG/DOSE) 8 MG/3ML ~~LOC~~ SOPN
2.0000 mg | PEN_INJECTOR | SUBCUTANEOUS | 1 refills | Status: DC
  Filled 2024-04-27 – 2024-05-01 (×3): qty 9, 84d supply, fill #0
  Filled 2024-05-06: qty 3, 28d supply, fill #0
  Filled 2024-06-12: qty 3, 28d supply, fill #1
  Filled 2024-07-11: qty 3, 28d supply, fill #2

## 2024-04-27 NOTE — Patient Instructions (Signed)
 Schedule your complete physical in 3-4 months We'll notify you of your lab results and make any changes if needed INCREASE the Ozempic  to 2mg  weekly Continue to work on healthy diet and regular exercise- you can do it! SCHEDULE your eye exam and mammogram Call with any questions or concerns Stay Safe!  Stay Healthy! Have a great summer!!

## 2024-04-27 NOTE — Progress Notes (Signed)
   Subjective:    Patient ID: Vanessa Reeves, female    DOB: 1971/04/26, 53 y.o.   MRN: [id number]  HPI DM- chronic problem, on Metformin  XR 500mg  BID and Semaglutide  1mg  weekly.  Last A1C 6.5%.  She is down 4 lbs since last visit.  Pt reports she was down even more but notes that her appetite is returning and the food noise is getting louder.  UTD on foot exam.  Due for eye exam.  Microalbumin due next month.  Having some chest pressure and SOB w/ high heat and humidity.  Denies symptomatic lows.  No abd pain, N/V.   Review of Systems For ROS see HPI     Objective:   Physical Exam Vitals reviewed.  Constitutional:      General: She is not in acute distress.    Appearance: Normal appearance. She is well-developed. She is obese. She is not ill-appearing.  HENT:     Head: Normocephalic and atraumatic.   Eyes:     Conjunctiva/sclera: Conjunctivae normal.     Pupils: Pupils are equal, round, and reactive to light.   Neck:     Thyroid : No thyromegaly.   Cardiovascular:     Rate and Rhythm: Normal rate and regular rhythm.     Pulses: Normal pulses.     Heart sounds: Normal heart sounds. No murmur heard. Pulmonary:     Effort: Pulmonary effort is normal. No respiratory distress.     Breath sounds: Normal breath sounds.  Abdominal:     General: There is no distension.     Palpations: Abdomen is soft.     Tenderness: There is no abdominal tenderness.   Musculoskeletal:     Cervical back: Normal range of motion and neck supple.     Right lower leg: No edema.     Left lower leg: No edema.  Lymphadenopathy:     Cervical: No cervical adenopathy.   Skin:    General: Skin is warm and dry.   Neurological:     General: No focal deficit present.     Mental Status: She is alert and oriented to person, place, and time.   Psychiatric:        Mood and Affect: Mood normal.        Behavior: Behavior normal.           Assessment & Plan:

## 2024-04-27 NOTE — Assessment & Plan Note (Signed)
 Chronic problem.  Will increase Ozempic  to 2mg  weekly due to return of appetite and food noise.  Will get microalbumin.  Pt to schedule eye exam.  Check labs.  Adjust meds prn

## 2024-04-28 ENCOUNTER — Other Ambulatory Visit: Payer: Self-pay | Admitting: Family Medicine

## 2024-04-28 DIAGNOSIS — Z1231 Encounter for screening mammogram for malignant neoplasm of breast: Secondary | ICD-10-CM

## 2024-05-01 ENCOUNTER — Other Ambulatory Visit (HOSPITAL_COMMUNITY): Payer: Self-pay

## 2024-05-01 ENCOUNTER — Encounter (HOSPITAL_COMMUNITY): Payer: Self-pay

## 2024-05-02 ENCOUNTER — Ambulatory Visit: Payer: Self-pay | Admitting: Family Medicine

## 2024-05-02 LAB — C3 AND C4
C3 Complement: 207 mg/dL — ABNORMAL HIGH (ref 83–193)
C4 Complement: 59 mg/dL — ABNORMAL HIGH (ref 15–57)

## 2024-05-02 LAB — ANTI-DNA ANTIBODY, DOUBLE-STRANDED: ds DNA Ab: <1 [IU]/mL

## 2024-05-08 ENCOUNTER — Other Ambulatory Visit (HOSPITAL_COMMUNITY): Payer: Self-pay

## 2024-05-08 ENCOUNTER — Other Ambulatory Visit: Payer: Self-pay | Admitting: Family Medicine

## 2024-05-08 ENCOUNTER — Ambulatory Visit
Admission: RE | Admit: 2024-05-08 | Discharge: 2024-05-08 | Disposition: A | Discharge status: Home / Self Care | Source: Ambulatory Visit | Attending: Family Medicine | Admitting: Family Medicine

## 2024-05-08 DIAGNOSIS — Z1231 Encounter for screening mammogram for malignant neoplasm of breast: Secondary | ICD-10-CM | POA: Diagnosis not present

## 2024-05-09 ENCOUNTER — Other Ambulatory Visit (HOSPITAL_COMMUNITY): Payer: Self-pay

## 2024-05-09 ENCOUNTER — Other Ambulatory Visit: Payer: Self-pay

## 2024-05-09 MED ORDER — FUROSEMIDE 20 MG PO TABS
20.0000 mg | ORAL_TABLET | Freq: Every day | ORAL | 1 refills | Status: AC
  Filled 2024-05-09: qty 90, 90d supply, fill #0
  Filled 2024-11-11: qty 90, 90d supply, fill #1

## 2024-05-09 MED ORDER — HYDROCHLOROTHIAZIDE 25 MG PO TABS
25.0000 mg | ORAL_TABLET | Freq: Every day | ORAL | 0 refills | Status: DC
  Filled 2024-05-09: qty 90, 90d supply, fill #0

## 2024-05-09 MED ORDER — AMLODIPINE BESYLATE 5 MG PO TABS
5.0000 mg | ORAL_TABLET | Freq: Every day | ORAL | 1 refills | Status: AC
  Filled 2024-05-09 – 2024-06-28 (×2): qty 90, 90d supply, fill #0
  Filled 2024-09-29: qty 90, 90d supply, fill #1

## 2024-05-12 ENCOUNTER — Encounter: Payer: Self-pay | Admitting: Family Medicine

## 2024-05-12 ENCOUNTER — Other Ambulatory Visit: Payer: Self-pay | Admitting: Family Medicine

## 2024-05-12 DIAGNOSIS — R928 Other abnormal and inconclusive findings on diagnostic imaging of breast: Secondary | ICD-10-CM

## 2024-05-12 NOTE — Telephone Encounter (Signed)
 Patient is asking about thoughts on the Mammogram and on if it is likely to need a Biopsy. Please advise results are under imaging tab

## 2024-05-15 NOTE — Progress Notes (Deleted)
 Office Visit Note  Patient: Vanessa Reeves             Date of Birth: [birth year]           MRN: [id number]             PCP: Mahlon Comer BRAVO, MD Referring: Mahlon Comer BRAVO, MD Visit Date: 05/29/2024 Occupation: @GUAROCC @  Subjective:  No chief complaint on file.   History of Present Illness: Vanessa Reeves is a 53 y.o. female ***     Activities of Daily Living:  Patient reports morning stiffness for *** {minute/hour:19697}.   Patient {ACTIONS;DENIES/REPORTS:21021675::Denies} nocturnal pain.  Difficulty dressing/grooming: {ACTIONS;DENIES/REPORTS:21021675::Denies} Difficulty climbing stairs: {ACTIONS;DENIES/REPORTS:21021675::Denies} Difficulty getting out of chair: {ACTIONS;DENIES/REPORTS:21021675::Denies} Difficulty using hands for taps, buttons, cutlery, and/or writing: {ACTIONS;DENIES/REPORTS:21021675::Denies}  No Rheumatology ROS completed.   PMFS History:  Patient Active Problem List   Diagnosis Date Noted   Pain in right knee 09/24/2023   Autoimmune disease (HCC) 08/19/2023   Polyp of transverse colon    Polyp of sigmoid colon    Polyp of rectum    Hearing loss of right ear 09/04/2020   Hyperlipidemia associated with type 2 diabetes mellitus (HCC) 02/21/2020   Insomnia 02/21/2020   Vitamin D  deficiency 02/21/2020   Degenerative disc disease, lumbar 02/21/2020   DM (diabetes mellitus) type II controlled, neurological manifestation (HCC) 06/14/2019   Special screening for malignant neoplasms, colon 04/11/2018   Memory changes 11/23/2017   RLS (restless legs syndrome) 03/17/2017   ADHD (attention deficit hyperactivity disorder), 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   Reactive airway disease 08/21/2016   Gastroesophageal reflux disease without esophagitis 12/20/2015   Physical exam 12/14/2014   Breast mass, left 02/16/2014   Chest pain 12/29/2013   Depression with anxiety 07/17/2013   Essential hypertension 07/17/2013   Eustachian tube dysfunction  07/17/2013   Adult BMI 50.0-59.9 kg/sq m (HCC) 07/17/2013    Past Medical History:  Diagnosis Date   Allergy    Anxiety    Arthritis    HANDS   Asthma    HAS MDI   Complication of anesthesia    Depression    Diabetes mellitus without complication (HCC)    Difficult airway for intubation    2013 used Video Laryngoscope   GERD (gastroesophageal reflux disease)    Herpes    Hyperlipidemia    Hypertension    RLS (restless legs syndrome) 03/17/2017   Right leg   Special screening for malignant neoplasms, colon 04/11/2018    Family History  Problem Relation Age of Onset   Hypertension Mother    Clotting disorder Mother    Stroke Father    Thrombosis Father    Congestive Heart Failure Father    Hypertension Paternal Grandmother    Diabetes Mellitus II Paternal Grandmother    Lupus Paternal Grandmother    Healthy Daughter    Healthy Son    Colon cancer Neg Hx    Esophageal cancer Neg Hx    Pancreatic cancer Neg Hx    Stomach cancer Neg Hx    Liver disease Neg Hx    Colon polyps Neg Hx    Past Surgical History:  Procedure Laterality Date   COLONOSCOPY WITH PROPOFOL  N/A 07/09/2021   Procedure: COLONOSCOPY WITH PROPOFOL ;  Surgeon: Nandigam, Kavitha V, MD;  Location: [facility];  Service: Endoscopy;  Laterality: N/A;   DILATION AND CURETTAGE OF UTERUS     POLYPECTOMY  07/09/2021   Procedure: POLYPECTOMY;  Surgeon: Shila Gustav GAILS, MD;  Location: [facility];  Service: Endoscopy;;   TOTAL ABDOMINAL HYSTERECTOMY     TUBAL LIGATION     Social History   Social History Narrative   Lives with husband and 2 children in a 2 story home.     Works as a Psychologist, sport and exercise at [employer].     Highest level of education:  High school, in college now   Immunization History  Administered Date(s) Administered   Influenza, Seasonal, Injecte, Preservative Fre 07/12/2023   Influenza,inj,Quad PF,6+ Mos 07/17/2013, 07/27/2014, 07/26/2015, 08/21/2016, 07/06/2017, 07/29/2018, 07/03/2019,  07/07/2022   Influenza-Unspecified 08/20/2020, 08/19/2021   MMR 03/08/2017, 04/05/2017   Moderna Sars-Covid-2 Vaccination 06/06/2020, 07/04/2020   PPD Test 07/26/2015, 03/07/2018, 05/23/2018   Pneumococcal Polysaccharide-23 01/16/2015   Tdap 10/14/2015     Objective: Vital Signs: LMP 12/04/2011    Physical Exam   Musculoskeletal Exam: ***  CDAI Exam: CDAI Score: -- Patient Global: --; Provider Global: -- Swollen: --; Tender: -- Joint Exam 05/29/2024   No joint exam has been documented for this visit   There is currently no information documented on the homunculus. Go to the Rheumatology activity and complete the homunculus joint exam.  Investigation: No additional findings.  Imaging: MM 3D SCREENING MAMMOGRAM BILATERAL BREAST Result Date: 05/11/2024 CLINICAL DATA:  Screening. EXAM: DIGITAL SCREENING BILATERAL MAMMOGRAM WITH TOMOSYNTHESIS AND CAD TECHNIQUE: Bilateral screening digital craniocaudal and mediolateral oblique mammograms were obtained. Bilateral screening digital breast tomosynthesis was performed. The images were evaluated with computer-aided detection. COMPARISON:  Previous exam(s). ACR Breast Density Category b: There are scattered areas of fibroglandular density. FINDINGS: In the left breast, a possible asymmetry with associated calcifications warrants further evaluation. In the right breast, no findings suspicious for malignancy. IMPRESSION: Further evaluation is suggested for possible asymmetry in the left breast. RECOMMENDATION: Diagnostic mammogram and possibly ultrasound of the left breast. (Code:[id number]) The patient will be contacted regarding the findings, and additional imaging will be scheduled. BI-RADS CATEGORY  0: Incomplete: Need additional imaging evaluation. Electronically Signed   By: Dirk Arrant M.D.   On: 05/11/2024 13:52    Recent Labs: Lab Results  Component Value Date   WBC 5.2 04/27/2024   HGB 11.2 (L) 04/27/2024   PLT 394.0 04/27/2024    NA 138 04/27/2024   K 3.7 04/27/2024   CL 99 04/27/2024   CO2 33 (H) 04/27/2024   GLUCOSE 107 (H) 04/27/2024   BUN 13 04/27/2024   CREATININE 0.94 04/27/2024   BILITOT 0.3 04/27/2024   ALKPHOS 73 04/27/2024   AST 13 04/27/2024   ALT 11 04/27/2024   PROT 7.8 04/27/2024   ALBUMIN 3.9 04/27/2024   CALCIUM 9.3 04/27/2024   GFRAA 74 12/16/2020   QFTBGOLDPLUS NEGATIVE 08/24/2023    Speciality Comments: PLQ Eye Exam: 06/28/2023 WNL  @ [facility] Follow up 1 year  Procedures:  No procedures performed Allergies: Penicillins   Assessment / Plan:     Visit Diagnoses: Autoimmune disease (HCC)  High risk medication use  Chronic pain of right knee  Bilateral hand pain  Myofascial pain  Other fatigue  Family history of blood clots  Chronic SI joint pain  Facet arthropathy  Diabetic polyneuropathy associated with diabetes mellitus due to underlying condition (HCC)  Moderate persistent reactive airway disease with acute exacerbation  Vitamin D  deficiency  Gastroesophageal reflux disease without esophagitis  RLS (restless legs syndrome)  Former smoker  ADHD (attention deficit hyperactivity disorder), inattentive type  Depression with anxiety  Essential hypertension  Orders: No orders of the defined types were placed  in this encounter.  No orders of the defined types were placed in this encounter.   Face-to-face time spent with patient was *** minutes. Greater than 50% of time was spent in counseling and coordination of care.  Follow-Up Instructions: No follow-ups on file.   Waddell CHRISTELLA Craze, PA-C  Note - This record has been created using Dragon software.  Chart creation errors have been sought, but may not always  have been located. Such creation errors do not reflect on  the standard of medical care.

## 2024-05-21 ENCOUNTER — Other Ambulatory Visit: Payer: Self-pay | Admitting: Family Medicine

## 2024-05-22 ENCOUNTER — Other Ambulatory Visit: Payer: Self-pay

## 2024-05-22 ENCOUNTER — Other Ambulatory Visit (HOSPITAL_COMMUNITY): Payer: Self-pay

## 2024-05-22 MED ORDER — ESCITALOPRAM OXALATE 20 MG PO TABS
20.0000 mg | ORAL_TABLET | Freq: Every day | ORAL | 1 refills | Status: AC
  Filled 2024-05-22 (×2): qty 90, 90d supply, fill #0
  Filled 2024-12-04: qty 90, 90d supply, fill #1

## 2024-05-23 ENCOUNTER — Other Ambulatory Visit: Payer: Self-pay | Admitting: Family Medicine

## 2024-05-23 DIAGNOSIS — R110 Nausea: Secondary | ICD-10-CM

## 2024-05-23 NOTE — Progress Notes (Unsigned)
 Office Visit Note  Patient: Vanessa Reeves             Date of Birth: 10/13/1971           MRN: [id number]             PCP: Mahlon Comer BRAVO, MD Referring: Mahlon Comer BRAVO, MD Visit Date: 05/25/2024 Occupation: @GUAROCC @  Subjective:  No chief complaint on file.   History of Present Illness: Vanessa Reeves is a 53 y.o. female ***     Activities of Daily Living:  Patient reports morning stiffness for *** {minute/hour:19697}.   Patient {ACTIONS;DENIES/REPORTS:21021675::Denies} nocturnal pain.  Difficulty dressing/grooming: {ACTIONS;DENIES/REPORTS:21021675::Denies} Difficulty climbing stairs: {ACTIONS;DENIES/REPORTS:21021675::Denies} Difficulty getting out of chair: {ACTIONS;DENIES/REPORTS:21021675::Denies} Difficulty using hands for taps, buttons, cutlery, and/or writing: {ACTIONS;DENIES/REPORTS:21021675::Denies}  No Rheumatology ROS completed.   PMFS History:  Patient Active Problem List   Diagnosis Date Noted  . Pain in right knee 09/24/2023  . Autoimmune disease (HCC) 08/19/2023  . Polyp of transverse colon   . Polyp of sigmoid colon   . Polyp of rectum   . Hearing loss of right ear 09/04/2020  . Hyperlipidemia associated with type 2 diabetes mellitus (HCC) 02/21/2020  . Insomnia 02/21/2020  . Vitamin D  deficiency 02/21/2020  . Degenerative disc disease, lumbar 02/21/2020  . DM (diabetes mellitus) type II controlled, neurological manifestation (HCC) 06/14/2019  . Special screening for malignant neoplasms, colon 04/11/2018  . Memory changes 11/23/2017  . RLS (restless legs syndrome) 03/17/2017  . 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 Reactive airway disease 08/21/2016  . Gastroesophageal reflux disease without esophagitis 12/20/2015  . Physical exam 12/14/2014  . Breast mass, left 02/16/2014  . Chest pain 12/29/2013  . Depression with anxiety 07/17/2013  . Essential hypertension 07/17/2013  .  Eustachian tube dysfunction 07/17/2013  . Adult BMI 50.0-59.9 kg/sq m (HCC) 07/17/2013    Past Medical History:  Diagnosis Date  . Allergy   . Anxiety   . Arthritis    HANDS  . Asthma    HAS MDI  . Complication of anesthesia   . Depression   . Diabetes mellitus without complication (HCC)   . Difficult airway for intubation    2013 used Video Laryngoscope  . GERD (gastroesophageal reflux disease)   . Herpes   . Hyperlipidemia   . Hypertension   . RLS (restless legs syndrome) 03/17/2017   Right leg  . Special screening for malignant neoplasms, colon 04/11/2018    Family History  Problem Relation Age of Onset  . Hypertension Mother   . Clotting disorder Mother   . Stroke Father   . Thrombosis Father   . Congestive Heart Failure Father   . Hypertension Paternal Grandmother   . Diabetes Mellitus II Paternal Grandmother   . Lupus Paternal Grandmother   . Healthy Daughter   . Healthy Son   . Colon cancer Neg Hx   . Esophageal cancer Neg Hx   . Pancreatic cancer Neg Hx   . Stomach cancer Neg Hx   . Liver disease Neg Hx   . Colon polyps Neg Hx    Past Surgical History:  Procedure Laterality Date  . COLONOSCOPY WITH PROPOFOL  N/A 07/09/2021   Procedure: COLONOSCOPY WITH PROPOFOL ;  Surgeon: Shila Gustav GAILS, MD;  Location: [facility];  Service: Endoscopy;  Laterality: N/A;  . DILATION AND CURETTAGE OF UTERUS    . POLYPECTOMY  07/09/2021   Procedure: POLYPECTOMY;  Surgeon: Shila Gustav GAILS, MD;  Location: [facility];  Service: Endoscopy;;  . TOTAL ABDOMINAL HYSTERECTOMY    . TUBAL LIGATION     Social History   Social History Narrative   Lives with husband and 2 children in a 2 story home.     Works as a Psychologist, sport and exercise at [employer].     Highest level of education:  High school, in college now   Immunization History  Administered Date(s) Administered  . Influenza, Seasonal, Injecte, Preservative Fre 07/12/2023  . Influenza,inj,Quad PF,6+ Mos 07/17/2013,  07/27/2014, 07/26/2015, 08/21/2016, 07/06/2017, 07/29/2018, 07/03/2019, 07/07/2022  . Influenza-Unspecified 08/20/2020, 08/19/2021  . MMR 03/08/2017, 04/05/2017  . Moderna Sars-Covid-2 Vaccination 06/06/2020, 07/04/2020  . PPD Test 07/26/2015, 03/07/2018, 05/23/2018  . Pneumococcal Polysaccharide-23 01/16/2015  . Tdap 10/14/2015     Objective: Vital Signs: LMP 12/04/2011    Physical Exam Vitals and nursing note reviewed.  Constitutional:      Appearance: She is well-developed.  HENT:     Head: Normocephalic and atraumatic.  Eyes:     Conjunctiva/sclera: Conjunctivae normal.  Cardiovascular:     Rate and Rhythm: Normal rate and regular rhythm.     Heart sounds: Normal heart sounds.  Pulmonary:     Effort: Pulmonary effort is normal.     Breath sounds: Normal breath sounds.  Abdominal:     General: Bowel sounds are normal.     Palpations: Abdomen is soft.  Musculoskeletal:     Cervical back: Normal range of motion.  Lymphadenopathy:     Cervical: No cervical adenopathy.  Skin:    General: Skin is warm and dry.     Capillary Refill: Capillary refill takes less than 2 seconds.  Neurological:     Mental Status: She is alert and oriented to person, place, and time.  Psychiatric:        Behavior: Behavior normal.     Musculoskeletal Exam: ***  CDAI Exam: CDAI Score: -- Patient Global: --; Provider Global: -- Swollen: --; Tender: -- Joint Exam 05/25/2024   No joint exam has been documented for this visit   There is currently no information documented on the homunculus. Go to the Rheumatology activity and complete the homunculus joint exam.  Investigation: No additional findings.  Imaging: MM 3D SCREENING MAMMOGRAM BILATERAL BREAST Result Date: 05/11/2024 CLINICAL DATA:  Screening. EXAM: DIGITAL SCREENING BILATERAL MAMMOGRAM WITH TOMOSYNTHESIS AND CAD TECHNIQUE: Bilateral screening digital craniocaudal and mediolateral oblique mammograms were obtained. Bilateral  screening digital breast tomosynthesis was performed. The images were evaluated with computer-aided detection. COMPARISON:  Previous exam(s). ACR Breast Density Category b: There are scattered areas of fibroglandular density. FINDINGS: In the left breast, a possible asymmetry with associated calcifications warrants further evaluation. In the right breast, no findings suspicious for malignancy. IMPRESSION: Further evaluation is suggested for possible asymmetry in the left breast. RECOMMENDATION: Diagnostic mammogram and possibly ultrasound of the left breast. (Code:[id number]) The patient will be contacted regarding the findings, and additional imaging will be scheduled. BI-RADS CATEGORY  0: Incomplete: Need additional imaging evaluation. Electronically Signed   By: Dirk Arrant M.D.   On: 05/11/2024 13:52    Recent Labs: Lab Results  Component Value Date   WBC 5.2 04/27/2024   HGB 11.2 (L) 04/27/2024   PLT 394.0 04/27/2024   NA 138 04/27/2024   K 3.7 04/27/2024   CL 99 04/27/2024   CO2 33 (H) 04/27/2024   GLUCOSE 107 (H) 04/27/2024   BUN 13 04/27/2024   CREATININE 0.94 04/27/2024   BILITOT 0.3 04/27/2024   ALKPHOS 73 04/27/2024  AST 13 04/27/2024   ALT 11 04/27/2024   PROT 7.8 04/27/2024   ALBUMIN 3.9 04/27/2024   CALCIUM 9.3 04/27/2024   GFRAA 74 12/16/2020   QFTBGOLDPLUS NEGATIVE 08/24/2023    Speciality Comments: PLQ Eye Exam: 06/28/2023 WNL  @ [facility] Follow up 1 year  Procedures:  No procedures performed Allergies: Penicillins   Assessment / Plan:     Visit Diagnoses: Autoimmune disease (HCC)  High risk medication use  Chronic pain of right knee  Bilateral hand pain  Myofascial pain  Other fatigue  Family history of blood clots  Chronic SI joint pain  Facet arthropathy  Diabetic polyneuropathy associated with diabetes mellitus due to underlying condition (HCC)  Moderate persistent reactive airway disease with acute exacerbation  Vitamin D   deficiency  Gastroesophageal reflux disease without esophagitis  RLS (restless legs syndrome)  Former smoker  ADHD (attention deficit hyperactivity disorder), inattentive type  Depression with anxiety  Essential hypertension  Orders: No orders of the defined types were placed in this encounter.  No orders of the defined types were placed in this encounter.   Face-to-face time spent with patient was *** minutes. Greater than 50% of time was spent in counseling and coordination of care.  Follow-Up Instructions: No follow-ups on file.   Waddell CHRISTELLA Craze, PA-C  Note - This record has been created using Dragon software.  Chart creation errors have been sought, but may not always  have been located. Such creation errors do not reflect on  the standard of medical care.

## 2024-05-24 ENCOUNTER — Other Ambulatory Visit (HOSPITAL_COMMUNITY): Payer: Self-pay

## 2024-05-24 MED ORDER — ONDANSETRON HCL 4 MG PO TABS
4.0000 mg | ORAL_TABLET | Freq: Three times a day (TID) | ORAL | 1 refills | Status: DC | PRN
  Filled 2024-05-24: qty 30, 10d supply, fill #0
  Filled 2024-06-28: qty 30, 10d supply, fill #1

## 2024-05-24 NOTE — Telephone Encounter (Signed)
 Requested Prescriptions   Pending Prescriptions Disp Refills   ondansetron  (ZOFRAN ) 4 MG tablet 30 tablet 1    Sig: Take 1 tablet (4 mg total) by mouth every 8 (eight) hours as needed for nausea or vomiting.    Last appointment: 04/27/24 Next appointment: 07/28/24 Last fill: 12/13/2023 10 day supply with 1 refill

## 2024-05-25 ENCOUNTER — Ambulatory Visit: Attending: Physician Assistant | Admitting: Physician Assistant

## 2024-05-25 ENCOUNTER — Encounter: Payer: Self-pay | Admitting: Physician Assistant

## 2024-05-25 ENCOUNTER — Other Ambulatory Visit (HOSPITAL_COMMUNITY): Payer: Self-pay

## 2024-05-25 VITALS — BP 128/82 | HR 80 | Resp 16 | Ht 64.0 in | Wt 320.0 lb

## 2024-05-25 DIAGNOSIS — M79641 Pain in right hand: Secondary | ICD-10-CM

## 2024-05-25 DIAGNOSIS — R5383 Other fatigue: Secondary | ICD-10-CM | POA: Diagnosis not present

## 2024-05-25 DIAGNOSIS — J4541 Moderate persistent asthma with (acute) exacerbation: Secondary | ICD-10-CM

## 2024-05-25 DIAGNOSIS — K219 Gastro-esophageal reflux disease without esophagitis: Secondary | ICD-10-CM

## 2024-05-25 DIAGNOSIS — F418 Other specified anxiety disorders: Secondary | ICD-10-CM

## 2024-05-25 DIAGNOSIS — E559 Vitamin D deficiency, unspecified: Secondary | ICD-10-CM

## 2024-05-25 DIAGNOSIS — E0842 Diabetes mellitus due to underlying condition with diabetic polyneuropathy: Secondary | ICD-10-CM | POA: Diagnosis not present

## 2024-05-25 DIAGNOSIS — M47819 Spondylosis without myelopathy or radiculopathy, site unspecified: Secondary | ICD-10-CM

## 2024-05-25 DIAGNOSIS — M7918 Myalgia, other site: Secondary | ICD-10-CM | POA: Diagnosis not present

## 2024-05-25 DIAGNOSIS — I10 Essential (primary) hypertension: Secondary | ICD-10-CM

## 2024-05-25 DIAGNOSIS — G8929 Other chronic pain: Secondary | ICD-10-CM

## 2024-05-25 DIAGNOSIS — M359 Systemic involvement of connective tissue, unspecified: Secondary | ICD-10-CM

## 2024-05-25 DIAGNOSIS — Z79899 Other long term (current) drug therapy: Secondary | ICD-10-CM | POA: Diagnosis not present

## 2024-05-25 DIAGNOSIS — G2581 Restless legs syndrome: Secondary | ICD-10-CM

## 2024-05-25 DIAGNOSIS — Z8249 Family history of ischemic heart disease and other diseases of the circulatory system: Secondary | ICD-10-CM

## 2024-05-25 DIAGNOSIS — M25561 Pain in right knee: Secondary | ICD-10-CM

## 2024-05-25 DIAGNOSIS — F900 Attention-deficit hyperactivity disorder, predominantly inattentive type: Secondary | ICD-10-CM

## 2024-05-25 DIAGNOSIS — Z87891 Personal history of nicotine dependence: Secondary | ICD-10-CM

## 2024-05-25 DIAGNOSIS — M533 Sacrococcygeal disorders, not elsewhere classified: Secondary | ICD-10-CM

## 2024-05-25 DIAGNOSIS — M79642 Pain in left hand: Secondary | ICD-10-CM

## 2024-05-25 MED ORDER — HYDROXYCHLOROQUINE SULFATE 200 MG PO TABS
200.0000 mg | ORAL_TABLET | Freq: Two times a day (BID) | ORAL | 0 refills | Status: DC
  Filled 2024-05-25: qty 180, 90d supply, fill #0

## 2024-05-25 MED ORDER — AZATHIOPRINE 50 MG PO TABS
50.0000 mg | ORAL_TABLET | Freq: Every day | ORAL | 2 refills | Status: DC
  Filled 2024-05-25: qty 30, 30d supply, fill #0
  Filled 2024-06-28: qty 30, 30d supply, fill #1
  Filled 2024-08-02: qty 30, 30d supply, fill #2

## 2024-05-25 NOTE — Patient Instructions (Signed)
 Standing Labs We placed an order today for your standing lab work.   Please have your standing labs drawn in 1 month then every 3 months  Please have your labs drawn 2 weeks prior to your appointment so that the provider can discuss your lab results at your appointment, if possible.  Please note that you may see your imaging and lab results in MyChart before we have reviewed them. We will contact you once all results are reviewed. Please allow our office up to 72 hours to thoroughly review all of the results before contacting the office for clarification of your results.  WALK-IN LAB HOURS  Monday through Thursday from 8:00 am -12:30 pm and 1:00 pm-4:30 pm and Friday from 8:00 am-12:00 pm.  Patients with office visits requiring labs will be seen before walk-in labs.  You may encounter longer than normal wait times. Please allow additional time. Wait times may be shorter on  Monday and Thursday afternoons.  We do not book appointments for walk-in labs. We appreciate your patience and understanding with our staff.   Labs are drawn by [facility]. Please bring your co-pay at the time of your lab draw.  You may receive a bill from [facility] for your lab work.  Please note if you are on Hydroxychloroquine and and an order has been placed for a Hydroxychloroquine level,  you will need to have it drawn 4 hours or more after your last dose.  If you wish to have your labs drawn at another location, please call the office 24 hours in advance so we can fax the orders.  The office is located at [address], [place], [place] [address]   If you have any questions regarding directions or hours of operation,  please call [phone].   As a reminder, please drink plenty of water prior to coming for your lab work. Thanks!

## 2024-05-29 ENCOUNTER — Ambulatory Visit: Payer: Commercial Managed Care - PPO | Admitting: Physician Assistant

## 2024-05-29 DIAGNOSIS — M359 Systemic involvement of connective tissue, unspecified: Secondary | ICD-10-CM

## 2024-05-29 DIAGNOSIS — J4541 Moderate persistent asthma with (acute) exacerbation: Secondary | ICD-10-CM

## 2024-05-29 DIAGNOSIS — E559 Vitamin D deficiency, unspecified: Secondary | ICD-10-CM

## 2024-05-29 DIAGNOSIS — M7918 Myalgia, other site: Secondary | ICD-10-CM

## 2024-05-29 DIAGNOSIS — F900 Attention-deficit hyperactivity disorder, predominantly inattentive type: Secondary | ICD-10-CM

## 2024-05-29 DIAGNOSIS — R5383 Other fatigue: Secondary | ICD-10-CM

## 2024-05-29 DIAGNOSIS — K219 Gastro-esophageal reflux disease without esophagitis: Secondary | ICD-10-CM

## 2024-05-29 DIAGNOSIS — M79641 Pain in right hand: Secondary | ICD-10-CM

## 2024-05-29 DIAGNOSIS — Z79899 Other long term (current) drug therapy: Secondary | ICD-10-CM

## 2024-05-29 DIAGNOSIS — F418 Other specified anxiety disorders: Secondary | ICD-10-CM

## 2024-05-29 DIAGNOSIS — G2581 Restless legs syndrome: Secondary | ICD-10-CM

## 2024-05-29 DIAGNOSIS — G8929 Other chronic pain: Secondary | ICD-10-CM

## 2024-05-29 DIAGNOSIS — M47819 Spondylosis without myelopathy or radiculopathy, site unspecified: Secondary | ICD-10-CM

## 2024-05-29 DIAGNOSIS — E0842 Diabetes mellitus due to underlying condition with diabetic polyneuropathy: Secondary | ICD-10-CM

## 2024-05-29 DIAGNOSIS — Z8249 Family history of ischemic heart disease and other diseases of the circulatory system: Secondary | ICD-10-CM

## 2024-05-29 DIAGNOSIS — I10 Essential (primary) hypertension: Secondary | ICD-10-CM

## 2024-05-29 DIAGNOSIS — Z87891 Personal history of nicotine dependence: Secondary | ICD-10-CM

## 2024-05-30 ENCOUNTER — Inpatient Hospital Stay
Admission: RE | Admit: 2024-05-30 | Discharge: 2024-05-30 | Discharge status: Home / Self Care | Source: Ambulatory Visit | Attending: Family Medicine | Admitting: Family Medicine

## 2024-05-30 ENCOUNTER — Ambulatory Visit
Admission: RE | Admit: 2024-05-30 | Discharge: 2024-05-30 | Disposition: A | Discharge status: Home / Self Care | Source: Ambulatory Visit | Attending: Family Medicine | Admitting: Family Medicine

## 2024-05-30 ENCOUNTER — Other Ambulatory Visit

## 2024-05-30 ENCOUNTER — Encounter

## 2024-05-30 DIAGNOSIS — R928 Other abnormal and inconclusive findings on diagnostic imaging of breast: Secondary | ICD-10-CM

## 2024-05-30 DIAGNOSIS — N6342 Unspecified lump in left breast, subareolar: Secondary | ICD-10-CM | POA: Diagnosis not present

## 2024-05-30 DIAGNOSIS — N6452 Nipple discharge: Secondary | ICD-10-CM | POA: Diagnosis not present

## 2024-05-30 DIAGNOSIS — N6042 Mammary duct ectasia of left breast: Secondary | ICD-10-CM | POA: Diagnosis not present

## 2024-05-30 DIAGNOSIS — R92332 Mammographic heterogeneous density, left breast: Secondary | ICD-10-CM | POA: Diagnosis not present

## 2024-05-30 DIAGNOSIS — R92322 Mammographic fibroglandular density, left breast: Secondary | ICD-10-CM | POA: Diagnosis not present

## 2024-05-31 ENCOUNTER — Other Ambulatory Visit: Payer: Self-pay | Admitting: Family Medicine

## 2024-05-31 DIAGNOSIS — R921 Mammographic calcification found on diagnostic imaging of breast: Secondary | ICD-10-CM

## 2024-06-01 ENCOUNTER — Other Ambulatory Visit: Payer: Self-pay

## 2024-06-01 ENCOUNTER — Other Ambulatory Visit: Payer: Self-pay | Admitting: Family Medicine

## 2024-06-01 ENCOUNTER — Ambulatory Visit
Admission: RE | Admit: 2024-06-01 | Discharge: 2024-06-01 | Disposition: A | Discharge status: Home / Self Care | Source: Ambulatory Visit | Attending: Family Medicine | Admitting: Family Medicine

## 2024-06-01 ENCOUNTER — Other Ambulatory Visit (HOSPITAL_COMMUNITY): Payer: Self-pay

## 2024-06-01 DIAGNOSIS — R92332 Mammographic heterogeneous density, left breast: Secondary | ICD-10-CM | POA: Diagnosis not present

## 2024-06-01 DIAGNOSIS — N6342 Unspecified lump in left breast, subareolar: Secondary | ICD-10-CM | POA: Diagnosis not present

## 2024-06-01 DIAGNOSIS — M51369 Other intervertebral disc degeneration, lumbar region without mention of lumbar back pain or lower extremity pain: Secondary | ICD-10-CM

## 2024-06-01 DIAGNOSIS — R921 Mammographic calcification found on diagnostic imaging of breast: Secondary | ICD-10-CM

## 2024-06-01 DIAGNOSIS — N6012 Diffuse cystic mastopathy of left breast: Secondary | ICD-10-CM | POA: Diagnosis not present

## 2024-06-01 HISTORY — PX: BREAST BIOPSY: SHX20

## 2024-06-01 MED ORDER — TRAMADOL-ACETAMINOPHEN 37.5-325 MG PO TABS
1.0000 | ORAL_TABLET | Freq: Four times a day (QID) | ORAL | 0 refills | Status: DC | PRN
  Filled 2024-06-01: qty 30, 8d supply, fill #0

## 2024-06-01 MED ORDER — CYCLOBENZAPRINE HCL 10 MG PO TABS
10.0000 mg | ORAL_TABLET | Freq: Every day | ORAL | 0 refills | Status: DC
  Filled 2024-06-01: qty 30, 30d supply, fill #0

## 2024-06-01 NOTE — Telephone Encounter (Signed)
 Requested Prescriptions   Pending Prescriptions Disp Refills   cyclobenzaprine  (FLEXERIL ) 10 MG tablet 30 tablet 0    Sig: Take 1 tablet (10 mg total) by mouth at bedtime.   traMADol -acetaminophen  (ULTRACET ) 37.5-325 MG tablet 30 tablet 0    Sig: Take 1 tablet by mouth every 6 (six) hours as needed.     Date of patient request: 06/01/24 Last office visit: 04/27/2024 Upcoming visit: 07/28/2024

## 2024-06-02 LAB — SURGICAL PATHOLOGY

## 2024-06-19 ENCOUNTER — Encounter: Payer: Self-pay | Admitting: Family Medicine

## 2024-06-19 DIAGNOSIS — E119 Type 2 diabetes mellitus without complications: Secondary | ICD-10-CM | POA: Diagnosis not present

## 2024-06-19 DIAGNOSIS — H524 Presbyopia: Secondary | ICD-10-CM | POA: Diagnosis not present

## 2024-06-19 DIAGNOSIS — M3500 Sicca syndrome, unspecified: Secondary | ICD-10-CM | POA: Diagnosis not present

## 2024-06-19 DIAGNOSIS — H52223 Regular astigmatism, bilateral: Secondary | ICD-10-CM | POA: Diagnosis not present

## 2024-06-19 DIAGNOSIS — H5213 Myopia, bilateral: Secondary | ICD-10-CM | POA: Diagnosis not present

## 2024-06-19 DIAGNOSIS — H04123 Dry eye syndrome of bilateral lacrimal glands: Secondary | ICD-10-CM | POA: Diagnosis not present

## 2024-06-19 DIAGNOSIS — H35033 Hypertensive retinopathy, bilateral: Secondary | ICD-10-CM | POA: Diagnosis not present

## 2024-06-19 LAB — HM DIABETES EYE EXAM

## 2024-06-28 ENCOUNTER — Other Ambulatory Visit (HOSPITAL_COMMUNITY): Payer: Self-pay

## 2024-06-28 ENCOUNTER — Telehealth: Payer: Self-pay

## 2024-06-28 ENCOUNTER — Other Ambulatory Visit: Payer: Self-pay | Admitting: Family Medicine

## 2024-06-28 ENCOUNTER — Other Ambulatory Visit: Payer: Self-pay

## 2024-06-28 MED ORDER — SULFACETAMIDE SODIUM-SULFUR 10-5 % EX LIQD
CUTANEOUS | 3 refills | Status: AC
  Filled 2024-06-28: qty 170, 20d supply, fill #0
  Filled 2024-08-01: qty 170, 20d supply, fill #1
  Filled 2024-09-29: qty 170, 20d supply, fill #2
  Filled 2024-11-02 – 2024-11-08 (×3): qty 170, 20d supply, fill #3

## 2024-06-28 MED ORDER — ALPRAZOLAM 0.5 MG PO TABS
0.5000 mg | ORAL_TABLET | Freq: Two times a day (BID) | ORAL | 1 refills | Status: DC | PRN
  Filled 2024-06-28: qty 30, 15d supply, fill #0

## 2024-06-28 NOTE — Telephone Encounter (Signed)
 Responded by MyChart msg to get more information before appointment can be scheduled. Waiting for reply

## 2024-06-28 NOTE — Telephone Encounter (Signed)
 Patient would like to schedule TB testing.[email or web address]

## 2024-06-28 NOTE — Telephone Encounter (Signed)
 Copied from CRM [id number]. Topic: Appointments - Scheduling Inquiry for Clinic >> Jun 28, 2024 12:35 PM Vanessa Reeves wrote: Reason for CRM: Patient is calling in to schedule TB testing, please contact the patient back. Patient prefers mychart for a response.

## 2024-06-28 NOTE — Telephone Encounter (Signed)
 Patient would like skin test TB test scheduled

## 2024-06-29 ENCOUNTER — Other Ambulatory Visit (HOSPITAL_COMMUNITY): Payer: Self-pay

## 2024-06-29 ENCOUNTER — Other Ambulatory Visit: Payer: Self-pay

## 2024-07-04 ENCOUNTER — Ambulatory Visit (INDEPENDENT_AMBULATORY_CARE_PROVIDER_SITE_OTHER): Admitting: Family Medicine

## 2024-07-04 ENCOUNTER — Other Ambulatory Visit: Payer: Self-pay

## 2024-07-04 ENCOUNTER — Ambulatory Visit

## 2024-07-04 ENCOUNTER — Other Ambulatory Visit

## 2024-07-04 DIAGNOSIS — Z23 Encounter for immunization: Secondary | ICD-10-CM | POA: Diagnosis not present

## 2024-07-04 DIAGNOSIS — Z111 Encounter for screening for respiratory tuberculosis: Secondary | ICD-10-CM

## 2024-07-04 NOTE — Progress Notes (Signed)
 Vanessa Reeves is a 53 y.o. female presents to the office today for Flu vaccine per physician's orders. Injection was administered Intramuscular Left deltoid.   Patient's next injection due N/A, appt made? no  Bascom GORMAN Collet

## 2024-07-04 NOTE — Telephone Encounter (Signed)
 Noted

## 2024-07-04 NOTE — Telephone Encounter (Signed)
 Please make sure that all requests for immunity testing (TB, shingles, measles, etc) or vaccines (with exception of flu) are ok'd by PCP prior to scheduling.  Thank you!

## 2024-07-06 ENCOUNTER — Ambulatory Visit

## 2024-07-06 ENCOUNTER — Ambulatory Visit: Payer: Self-pay | Admitting: Family Medicine

## 2024-07-06 LAB — QUANTIFERON-TB GOLD PLUS
Mitogen-NIL: 7.54 [IU]/mL
NIL: 0.01 [IU]/mL
QuantiFERON-TB Gold Plus: NEGATIVE
TB1-NIL: 0 [IU]/mL
TB2-NIL: 0.01 [IU]/mL

## 2024-07-06 NOTE — Telephone Encounter (Signed)
 This was addressed with the front desk and will be carried out correctly moving forward.

## 2024-07-10 NOTE — Progress Notes (Deleted)
 Office Visit Note  Patient: Vanessa Reeves             Date of Birth: 19-Jun-1971           MRN: [id number]             PCP: Mahlon Comer BRAVO, MD Referring: Mahlon Comer BRAVO, MD Visit Date: 07/24/2024 Occupation: @GUAROCC @  Subjective:  No chief complaint on file.   History of Present Illness: Vanessa Reeves is a 53 y.o. female ***     Activities of Daily Living:  Patient reports morning stiffness for *** {minute/hour:19697}.   Patient {ACTIONS;DENIES/REPORTS:21021675::Denies} nocturnal pain.  Difficulty dressing/grooming: {ACTIONS;DENIES/REPORTS:21021675::Denies} Difficulty climbing stairs: {ACTIONS;DENIES/REPORTS:21021675::Denies} Difficulty getting out of chair: {ACTIONS;DENIES/REPORTS:21021675::Denies} Difficulty using hands for taps, buttons, cutlery, and/or writing: {ACTIONS;DENIES/REPORTS:21021675::Denies}  No Rheumatology ROS completed.   PMFS History:  Patient Active Problem List   Diagnosis Date Noted  . Pain in right knee 09/24/2023  . Autoimmune disease (HCC) 08/19/2023  . Polyp of transverse colon   . Polyp of sigmoid colon   . Polyp of rectum   . Hearing loss of right ear 09/04/2020  . Hyperlipidemia associated with type 2 diabetes mellitus (HCC) 02/21/2020  . Insomnia 02/21/2020  . Vitamin D  deficiency 02/21/2020  . Degenerative disc disease, lumbar 02/21/2020  . DM (diabetes mellitus) type II controlled, neurological manifestation (HCC) 06/14/2019  . Special screening for malignant neoplasms, colon 04/11/2018  . Memory changes 11/23/2017  . RLS (restless legs syndrome) 03/17/2017  . 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 Reactive airway disease 08/21/2016  . Gastroesophageal reflux disease without esophagitis 12/20/2015  . Physical exam 12/14/2014  . Breast mass, left 02/16/2014  . Chest pain 12/29/2013  . Depression with anxiety 07/17/2013  . Essential hypertension 07/17/2013  .  Eustachian tube dysfunction 07/17/2013  . Adult BMI 50.0-59.9 kg/sq m (HCC) 07/17/2013    Past Medical History:  Diagnosis Date  . Allergy   . Anxiety   . Arthritis    HANDS  . Asthma    HAS MDI  . Complication of anesthesia   . Depression   . Diabetes mellitus without complication (HCC)   . Difficult airway for intubation    2013 used Video Laryngoscope  . GERD (gastroesophageal reflux disease)   . Herpes   . Hyperlipidemia   . Hypertension   . RLS (restless legs syndrome) 03/17/2017   Right leg  . Special screening for malignant neoplasms, colon 04/11/2018    Family History  Problem Relation Age of Onset  . Hypertension Mother   . Clotting disorder Mother   . Stroke Father   . Thrombosis Father   . Congestive Heart Failure Father   . Hypertension Paternal Grandmother   . Diabetes Mellitus II Paternal Grandmother   . Lupus Paternal Grandmother   . Healthy Daughter   . Healthy Son   . Colon cancer Neg Hx   . Esophageal cancer Neg Hx   . Pancreatic cancer Neg Hx   . Stomach cancer Neg Hx   . Liver disease Neg Hx   . Colon polyps Neg Hx    Past Surgical History:  Procedure Laterality Date  . BREAST BIOPSY Left 06/01/2024   MM LT BREAST BX W LOC DEV 1ST LESION IMAGE BX SPEC STEREO GUIDE 06/01/2024 [facility]  . COLONOSCOPY WITH PROPOFOL  N/A 07/09/2021   Procedure: COLONOSCOPY WITH PROPOFOL ;  Surgeon: Shila Gustav GAILS, MD;  Location: [facility];  Service: Endoscopy;  Laterality: N/A;  . DILATION  AND CURETTAGE OF UTERUS    . POLYPECTOMY  07/09/2021   Procedure: POLYPECTOMY;  Surgeon: Shila Gustav GAILS, MD;  Location: [facility];  Service: Endoscopy;;  . TOTAL ABDOMINAL HYSTERECTOMY    . TUBAL LIGATION     Social History   Social History Narrative   Lives with husband and 2 children in a 2 story home.     Works as a Psychologist, sport and exercise at [employer].     Highest level of education:  High school, in college now   Immunization History  Administered  Date(s) Administered  . Influenza, Seasonal, Injecte, Preservative Fre 07/12/2023, 07/04/2024  . Influenza,inj,Quad PF,6+ Mos 07/17/2013, 07/27/2014, 07/26/2015, 08/21/2016, 07/06/2017, 07/29/2018, 07/03/2019, 07/07/2022  . Influenza-Unspecified 08/20/2020, 08/19/2021  . MMR 03/08/2017, 04/05/2017  . Moderna Sars-Covid-2 Vaccination 06/06/2020, 07/04/2020  . PPD Test 07/26/2015, 03/07/2018, 05/23/2018  . Pneumococcal Polysaccharide-23 01/16/2015  . Tdap 10/14/2015     Objective: Vital Signs: LMP 12/04/2011    Physical Exam   Musculoskeletal Exam: ***  CDAI Exam: CDAI Score: -- Patient Global: --; Provider Global: -- Swollen: --; Tender: -- Joint Exam 07/24/2024   No joint exam has been documented for this visit   There is currently no information documented on the homunculus. Go to the Rheumatology activity and complete the homunculus joint exam.  Investigation: No additional findings.  Imaging: No results found.  Recent Labs: Lab Results  Component Value Date   WBC 5.2 04/27/2024   HGB 11.2 (L) 04/27/2024   PLT 394.0 04/27/2024   NA 138 04/27/2024   K 3.7 04/27/2024   CL 99 04/27/2024   CO2 33 (H) 04/27/2024   GLUCOSE 107 (H) 04/27/2024   BUN 13 04/27/2024   CREATININE 0.94 04/27/2024   BILITOT 0.3 04/27/2024   ALKPHOS 73 04/27/2024   AST 13 04/27/2024   ALT 11 04/27/2024   PROT 7.8 04/27/2024   ALBUMIN 3.9 04/27/2024   CALCIUM 9.3 04/27/2024   GFRAA 74 12/16/2020   QFTBGOLDPLUS NEGATIVE 07/04/2024    Speciality Comments: PLQ Eye Exam: 06/28/2023 WNL  @ [facility] Follow up 1 year  Procedures:  No procedures performed Allergies: Penicillins   Assessment / Plan:     Visit Diagnoses: No diagnosis found.  Orders: No orders of the defined types were placed in this encounter.  No orders of the defined types were placed in this encounter.   Face-to-face time spent with patient was *** minutes. Greater than 50% of time was spent in counseling  and coordination of care.  Follow-Up Instructions: No follow-ups on file.   Waddell CHRISTELLA Craze, PA-C  Note - This record has been created using Dragon software.  Chart creation errors have been sought, but may not always  have been located. Such creation errors do not reflect on  the standard of medical care.

## 2024-07-11 ENCOUNTER — Encounter: Payer: Self-pay | Admitting: Family Medicine

## 2024-07-11 ENCOUNTER — Other Ambulatory Visit (HOSPITAL_COMMUNITY): Payer: Self-pay

## 2024-07-11 ENCOUNTER — Ambulatory Visit: Admitting: Family Medicine

## 2024-07-11 VITALS — BP 138/80 | HR 88 | Temp 98.0°F | Ht 64.0 in | Wt 313.2 lb

## 2024-07-11 DIAGNOSIS — B369 Superficial mycosis, unspecified: Secondary | ICD-10-CM

## 2024-07-11 MED ORDER — CLOTRIMAZOLE-BETAMETHASONE 1-0.05 % EX CREA
1.0000 | TOPICAL_CREAM | Freq: Every day | CUTANEOUS | 0 refills | Status: DC
  Filled 2024-07-11: qty 45, 20d supply, fill #0

## 2024-07-11 NOTE — Patient Instructions (Signed)
 Follow up as needed or as scheduled USE the combination cream twice daily USE Gold Bond powder during the day to keep area dry and keep skin from rubbing Call with any questions or concerns Stay Safe!  Stay Healthy! Hang in there!!!

## 2024-07-11 NOTE — Progress Notes (Unsigned)
   Subjective:    Patient ID: Vanessa Reeves, female    DOB: 1971-07-12, 53 y.o.   MRN: [id number]  HPI Rash- R flank.  First noticed 2 days ago- itchy, painful, inflamed.  In skin fold.  Has an odor.   Review of Systems For ROS see HPI     Objective:   Physical Exam Vitals reviewed.  Constitutional:      General: She is not in acute distress.    Appearance: Normal appearance. She is obese. She is not ill-appearing.  HENT:     Head: Normocephalic and atraumatic.  Skin:    General: Skin is warm and dry.     Findings: Rash present.  Neurological:     General: No focal deficit present.     Mental Status: She is alert and oriented to person, place, and time.           Assessment & Plan:  Fungal dermatitis- new.  Start Lotrisone  cream BID and Gold Bond prn.  Reviewed supportive care and red flags that should prompt return.  Pt expressed understanding and is in agreement w/ plan.

## 2024-07-21 ENCOUNTER — Other Ambulatory Visit (HOSPITAL_COMMUNITY): Payer: Self-pay

## 2024-07-21 ENCOUNTER — Other Ambulatory Visit: Payer: Self-pay | Admitting: Family

## 2024-07-21 ENCOUNTER — Encounter: Payer: Self-pay | Admitting: Family Medicine

## 2024-07-21 ENCOUNTER — Other Ambulatory Visit (INDEPENDENT_AMBULATORY_CARE_PROVIDER_SITE_OTHER): Payer: Self-pay

## 2024-07-21 ENCOUNTER — Ambulatory Visit: Admitting: Physician Assistant

## 2024-07-21 ENCOUNTER — Other Ambulatory Visit: Payer: Self-pay

## 2024-07-21 DIAGNOSIS — E1142 Type 2 diabetes mellitus with diabetic polyneuropathy: Secondary | ICD-10-CM | POA: Diagnosis not present

## 2024-07-21 DIAGNOSIS — M25562 Pain in left knee: Secondary | ICD-10-CM

## 2024-07-21 DIAGNOSIS — Z79899 Other long term (current) drug therapy: Secondary | ICD-10-CM | POA: Diagnosis not present

## 2024-07-21 DIAGNOSIS — M170 Bilateral primary osteoarthritis of knee: Secondary | ICD-10-CM | POA: Diagnosis not present

## 2024-07-21 DIAGNOSIS — E119 Type 2 diabetes mellitus without complications: Secondary | ICD-10-CM | POA: Diagnosis not present

## 2024-07-21 DIAGNOSIS — M3210 Systemic lupus erythematosus, organ or system involvement unspecified: Secondary | ICD-10-CM | POA: Diagnosis not present

## 2024-07-21 DIAGNOSIS — H35033 Hypertensive retinopathy, bilateral: Secondary | ICD-10-CM | POA: Diagnosis not present

## 2024-07-21 LAB — POCT GLYCOSYLATED HEMOGLOBIN (HGB A1C): Hemoglobin A1C: 5.7 % — AB (ref 4.0–5.6)

## 2024-07-21 MED ORDER — METHYLPREDNISOLONE ACETATE 40 MG/ML IJ SUSP
40.0000 mg | INTRAMUSCULAR | Status: AC | PRN
  Administered 2024-07-21: 40 mg via INTRA_ARTICULAR

## 2024-07-21 MED ORDER — LIDOCAINE HCL 1 % IJ SOLN
4.0000 mL | INTRAMUSCULAR | Status: AC | PRN
  Administered 2024-07-21: 4 mL

## 2024-07-21 MED ORDER — TRAMADOL-ACETAMINOPHEN 37.5-325 MG PO TABS
1.0000 | ORAL_TABLET | Freq: Four times a day (QID) | ORAL | 0 refills | Status: DC | PRN
  Filled 2024-07-21: qty 30, 8d supply, fill #0

## 2024-07-21 NOTE — Progress Notes (Unsigned)
 Requested Prescriptions   Pending Prescriptions Disp Refills   traMADol -acetaminophen  (ULTRACET ) 37.5-325 MG tablet 30 tablet 0    Sig: Take 1 tablet by mouth every 6 (six) hours as needed.     Date of patient request: 07/21/24 Last office visit: 07/11/24 Upcoming visit: 07/28/24 Date of last refill: 06/01/24 Last refill amount: 8 day supply

## 2024-07-21 NOTE — Addendum Note (Signed)
 Addended by: FRANCES LIONEL CROME on: 07/21/2024 09:48 AM   Modules accepted: Orders

## 2024-07-21 NOTE — Progress Notes (Signed)
 Office Visit Note   Patient: Vanessa Reeves           Date of Birth: 1971/10/27           MRN: [id number] Visit Date: 07/21/2024              Requested by: Mahlon Comer BRAVO, MD [id number] [place],  [place] [address] PCP: Mahlon Comer BRAVO, MD  No chief complaint on file.     HPI: Patient is a pleasant 53 year old woman comes in for injections for her knees.  Has a history of osteoarthritis  Assessment & Plan: Visit Diagnoses:  1. Left knee pain, unspecified chronicity   2. Primary osteoarthritis of both knees     Plan: She is a prediabetic her A1c was 5.7 today we will feel comfortable going ahead with bilateral knee injections May follow-up as needed  Follow-Up Instructions: No follow-ups on file.   Ortho Exam  Patient is alert, oriented, no adenopathy, well-dressed, normal affect, normal respiratory effort. Bilateral knees no effusion no erythema compartments are soft and compressible she is neurovascular intact no evidence of infective process    Imaging: XR Knee 1-2 Views Left Result Date: 07/21/2024 Radiographs of the left knee demonstrate well-maintained alignment with degenerative and sclerotic changes of the left knee no acute osseous injuries  No images are attached to the encounter.  Labs: Lab Results  Component Value Date   HGBA1C 6.1 04/27/2024   HGBA1C 6.5 01/21/2024   HGBA1C 8.3 (H) 10/20/2023   ESRSEDRATE 75 (H) 04/27/2024   ESRSEDRATE 87 (H) 12/30/2023   ESRSEDRATE 53 (H) 08/24/2023   CRP 26.1 (H) 08/24/2023   REPTSTATUS 07/30/2009 FINAL 07/29/2009   CULT  07/29/2009    Multiple bacterial morphotypes present, none predominant. Suggest appropriate recollection if clinically indicated.   LABORGA NO GROWTH 03/30/2014     Lab Results  Component Value Date   ALBUMIN 3.9 04/27/2024   ALBUMIN 4.2 01/21/2024   ALBUMIN 3.8 10/20/2023    Lab Results  Component Value Date   MG 2.0 01/21/2024   Lab Results  Component Value Date    VD25OH 33.45 01/21/2024   VD25OH 15.65 (L) 01/20/2023   VD25OH 36 07/14/2022    No results found for: PREALBUMIN    Latest Ref Rng & Units 04/27/2024    8:36 AM 01/21/2024    9:07 AM 12/30/2023    8:30 AM  CBC EXTENDED  WBC 4.0 - 10.5 K/uL 5.2  7.9  9.3   RBC 3.87 - 5.11 Mil/uL 3.93  3.97  4.42   Hemoglobin 12.0 - 15.0 g/dL 88.7  88.5  87.7   HCT 36.0 - 46.0 % 34.1  34.3  39.1   Platelets 150.0 - 400.0 K/uL 394.0  452.0  450   NEUT# 1.4 - 7.7 K/uL 2.4  3.6  5,506   Lymph# 0.7 - 4.0 K/uL 2.2  3.4       There is no height or weight on file to calculate BMI.  Orders:  Orders Placed This Encounter  Procedures  . XR Knee 1-2 Views Left   No orders of the defined types were placed in this encounter.    Procedures: Large Joint Inj: bilateral knee on 07/21/2024 9:04 AM Indications: pain and diagnostic evaluation Details: 25 G 1.5 in needle, anteromedial approach  Arthrogram: No  Medications (Right): 4 mL lidocaine  1 %; 40 mg methylPREDNISolone  acetate 40 MG/ML Medications (Left): 4 mL lidocaine  1 %; 40 mg methylPREDNISolone  acetate 40  MG/ML Outcome: tolerated well, no immediate complications Procedure, treatment alternatives, risks and benefits explained, specific risks discussed. Consent was given by the patient.     Clinical Data: No additional findings.  ROS:  All other systems negative, except as noted in the HPI. Review of Systems  Objective: Vital Signs: LMP 12/04/2011   Specialty Comments:  No specialty comments available.  PMFS History: Patient Active Problem List   Diagnosis Date Noted  . Osteoarthritis of knees, bilateral 07/21/2024  . Pain in right knee 09/24/2023  . Autoimmune disease (HCC) 08/19/2023  . Polyp of transverse colon   . Polyp of sigmoid colon   . Polyp of rectum   . Hearing loss of right ear 09/04/2020  . Hyperlipidemia associated with type 2 diabetes mellitus (HCC) 02/21/2020  . Insomnia 02/21/2020  . Vitamin D  deficiency  02/21/2020  . Degenerative disc disease, lumbar 02/21/2020  . DM (diabetes mellitus) type II controlled, neurological manifestation (HCC) 06/14/2019  . Special screening for malignant neoplasms, colon 04/11/2018  . Memory changes 11/23/2017  . RLS (restless legs syndrome) 03/17/2017  . 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 Reactive airway disease 08/21/2016  . Gastroesophageal reflux disease without esophagitis 12/20/2015  . Physical exam 12/14/2014  . Breast mass, left 02/16/2014  . Chest pain 12/29/2013  . Depression with anxiety 07/17/2013  . Essential hypertension 07/17/2013  . Eustachian tube dysfunction 07/17/2013  . Adult BMI 50.0-59.9 kg/sq m (HCC) 07/17/2013   Past Medical History:  Diagnosis Date  . Allergy   . Anxiety   . Arthritis    HANDS  . Asthma    HAS MDI  . Complication of anesthesia   . Depression   . Diabetes mellitus without complication (HCC)   . Difficult airway for intubation    2013 used Video Laryngoscope  . GERD (gastroesophageal reflux disease)   . Herpes   . Hyperlipidemia   . Hypertension   . RLS (restless legs syndrome) 03/17/2017   Right leg  . Special screening for malignant neoplasms, colon 04/11/2018    Family History  Problem Relation Age of Onset  . Hypertension Mother   . Clotting disorder Mother   . Stroke Father   . Thrombosis Father   . Congestive Heart Failure Father   . Hypertension Paternal Grandmother   . Diabetes Mellitus II Paternal Grandmother   . Lupus Paternal Grandmother   . Healthy Daughter   . Healthy Son   . Colon cancer Neg Hx   . Esophageal cancer Neg Hx   . Pancreatic cancer Neg Hx   . Stomach cancer Neg Hx   . Liver disease Neg Hx   . Colon polyps Neg Hx     Past Surgical History:  Procedure Laterality Date  . BREAST BIOPSY Left 06/01/2024   MM LT BREAST BX W LOC DEV 1ST LESION IMAGE BX SPEC STEREO GUIDE 06/01/2024 [facility]  . COLONOSCOPY WITH  PROPOFOL  N/A 07/09/2021   Procedure: COLONOSCOPY WITH PROPOFOL ;  Surgeon: Shila Gustav GAILS, MD;  Location: [facility];  Service: Endoscopy;  Laterality: N/A;  . DILATION AND CURETTAGE OF UTERUS    . POLYPECTOMY  07/09/2021   Procedure: POLYPECTOMY;  Surgeon: Shila Gustav GAILS, MD;  Location: [facility];  Service: Endoscopy;;  . TOTAL ABDOMINAL HYSTERECTOMY    . TUBAL LIGATION     Social History   Occupational History  . Not on file  Tobacco Use  . Smoking status: Former    Types: Cigarettes  Passive exposure: Never  . Smokeless tobacco: Never  Vaping Use  . Vaping status: Never Used  Substance and Sexual Activity  . Alcohol use: Not Currently  . Drug use: Not Currently    Types: Marijuana  . Sexual activity: Yes    Birth control/protection: Surgical

## 2024-07-24 ENCOUNTER — Other Ambulatory Visit (HOSPITAL_COMMUNITY): Payer: Self-pay

## 2024-07-24 ENCOUNTER — Ambulatory Visit: Admitting: Physician Assistant

## 2024-07-24 DIAGNOSIS — K219 Gastro-esophageal reflux disease without esophagitis: Secondary | ICD-10-CM

## 2024-07-24 DIAGNOSIS — F900 Attention-deficit hyperactivity disorder, predominantly inattentive type: Secondary | ICD-10-CM

## 2024-07-24 DIAGNOSIS — M79641 Pain in right hand: Secondary | ICD-10-CM

## 2024-07-24 DIAGNOSIS — R5383 Other fatigue: Secondary | ICD-10-CM

## 2024-07-24 DIAGNOSIS — E0842 Diabetes mellitus due to underlying condition with diabetic polyneuropathy: Secondary | ICD-10-CM

## 2024-07-24 DIAGNOSIS — G2581 Restless legs syndrome: Secondary | ICD-10-CM

## 2024-07-24 DIAGNOSIS — I10 Essential (primary) hypertension: Secondary | ICD-10-CM

## 2024-07-24 DIAGNOSIS — Z87891 Personal history of nicotine dependence: Secondary | ICD-10-CM

## 2024-07-24 DIAGNOSIS — J4541 Moderate persistent asthma with (acute) exacerbation: Secondary | ICD-10-CM

## 2024-07-24 DIAGNOSIS — Z79899 Other long term (current) drug therapy: Secondary | ICD-10-CM

## 2024-07-24 DIAGNOSIS — M359 Systemic involvement of connective tissue, unspecified: Secondary | ICD-10-CM

## 2024-07-24 DIAGNOSIS — E559 Vitamin D deficiency, unspecified: Secondary | ICD-10-CM

## 2024-07-24 DIAGNOSIS — M7918 Myalgia, other site: Secondary | ICD-10-CM

## 2024-07-24 DIAGNOSIS — Z8249 Family history of ischemic heart disease and other diseases of the circulatory system: Secondary | ICD-10-CM

## 2024-07-24 DIAGNOSIS — F418 Other specified anxiety disorders: Secondary | ICD-10-CM

## 2024-07-24 DIAGNOSIS — M47819 Spondylosis without myelopathy or radiculopathy, site unspecified: Secondary | ICD-10-CM

## 2024-07-24 DIAGNOSIS — G8929 Other chronic pain: Secondary | ICD-10-CM

## 2024-07-26 NOTE — Progress Notes (Signed)
 Office Visit Note  Patient: Vanessa Reeves             Date of Birth: February 19, 1971           MRN: [id number]             PCP: Mahlon Comer BRAVO, MD Referring: Mahlon Comer BRAVO, MD Visit Date: 08/09/2024 Occupation: Data Unavailable  Subjective:  Discuss results   History of Present Illness: Vanessa Reeves is a 53 y.o. female with history undifferentiated connective tissue disease.  Patient remains on Plaquenil  200 mg 1 tablet by mouth twice daily and Imuran  50 mg 1 tablet by mouth daily.  She has been tolerating Imuran  as combination therapy.  She has started to notice less frequent and less severe flares.  Her pain levels have been better managed.  She has had less stiffness in both hands.  She denies any recent facial rashes.  Patient continues to have chronic sicca symptoms.  She has been using over-the-counter products for symptomatic relief.  She denies any facial rashes. She has been started on vitamin D  50,000 units once weekly.    Activities of Daily Living:  Patient reports morning stiffness for 5 minutes.   Patient Reports nocturnal pain.  Difficulty dressing/grooming: Denies Difficulty climbing stairs: Denies Difficulty getting out of chair: Reports Difficulty using hands for taps, buttons, cutlery, and/or writing: Reports  Review of Systems  Constitutional:  Negative for fatigue.  HENT:  Positive for mouth sores and mouth dryness.   Eyes:  Positive for dryness.  Respiratory:  Negative for shortness of breath.   Cardiovascular:  Negative for chest pain and palpitations.  Gastrointestinal:  Positive for constipation. Negative for blood in stool and diarrhea.  Endocrine: Negative for increased urination.  Genitourinary:  Negative for involuntary urination.  Musculoskeletal:  Positive for joint pain, joint pain, joint swelling, myalgias, morning stiffness, muscle tenderness and myalgias. Negative for gait problem and muscle weakness.  Skin:  Positive for rash and  sensitivity to sunlight. Negative for color change and hair loss.  Allergic/Immunologic: Negative for susceptible to infections.  Neurological:  Positive for headaches. Negative for dizziness.  Hematological:  Negative for swollen glands.  Psychiatric/Behavioral:  Negative for depressed mood and sleep disturbance. The patient is nervous/anxious.     PMFS History:  Patient Active Problem List   Diagnosis Date Noted   Osteoarthritis of knees, bilateral 07/21/2024   Pain in right knee 09/24/2023   Autoimmune disease 08/19/2023   Polyp of transverse colon    Polyp of sigmoid colon    Polyp of rectum    Hearing loss of right ear 09/04/2020   Hyperlipidemia associated with type 2 diabetes mellitus (HCC) 02/21/2020   Insomnia 02/21/2020   Vitamin D  deficiency 02/21/2020   Degenerative disc disease, lumbar 02/21/2020   DM (diabetes mellitus) type II controlled, neurological manifestation (HCC) 06/14/2019   Special screening for malignant neoplasms, colon 04/11/2018   Memory changes 11/23/2017   RLS (restless legs syndrome) 03/17/2017   ADHD (attention deficit hyperactivity disorder), 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   Reactive airway disease 08/21/2016   Gastroesophageal reflux disease without esophagitis 12/20/2015   Breast mass, left 02/16/2014   Chest pain 12/29/2013   Depression with anxiety 07/17/2013   Essential hypertension 07/17/2013   Eustachian tube dysfunction 07/17/2013   Adult BMI 50.0-59.9 kg/sq m (HCC) 07/17/2013    Past Medical History:  Diagnosis Date   Allergy    Anxiety    Arthritis    HANDS   Asthma  HAS MDI   Complication of anesthesia    Depression    Diabetes mellitus without complication (HCC)    Difficult airway for intubation    2013 used Video Laryngoscope   GERD (gastroesophageal reflux disease)    Herpes    Hyperlipidemia    Hypertension    RLS (restless legs syndrome) 03/17/2017   Right leg   Special screening for malignant neoplasms, colon  04/11/2018    Family History  Problem Relation Age of Onset   Hypertension Mother    Clotting disorder Mother    Stroke Father    Thrombosis Father    Congestive Heart Failure Father    Hypertension Paternal Grandmother    Diabetes Mellitus II Paternal Grandmother    Lupus Paternal Grandmother    Healthy Daughter    Healthy Son    Colon cancer Neg Hx    Esophageal cancer Neg Hx    Pancreatic cancer Neg Hx    Stomach cancer Neg Hx    Liver disease Neg Hx    Colon polyps Neg Hx    Past Surgical History:  Procedure Laterality Date   BREAST BIOPSY Left 06/01/2024   MM LT BREAST BX W LOC DEV 1ST LESION IMAGE BX SPEC STEREO GUIDE 06/01/2024 [facility]   COLONOSCOPY WITH PROPOFOL  N/A 07/09/2021   Procedure: COLONOSCOPY WITH PROPOFOL ;  Surgeon: Shila Gustav GAILS, MD;  Location: [facility];  Service: Endoscopy;  Laterality: N/A;   DILATION AND CURETTAGE OF UTERUS     POLYPECTOMY  07/09/2021   Procedure: POLYPECTOMY;  Surgeon: Shila Gustav GAILS, MD;  Location: [facility];  Service: Endoscopy;;   TOTAL ABDOMINAL HYSTERECTOMY     TUBAL LIGATION     Social History   Tobacco Use   Smoking status: Former    Types: Cigarettes    Passive exposure: Never   Smokeless tobacco: Never  Vaping Use   Vaping status: Never Used  Substance Use Topics   Alcohol use: Yes    Comment: occ   Drug use: Not Currently    Types: Marijuana   Social History   Social History Narrative   Lives with husband and 2 children in a 2 story home.     Works as a Psychologist, sport and exercise at [employer].     Highest level of education:  High school, in college now     Immunization History  Administered Date(s) Administered   Influenza, Seasonal, Injecte, Preservative Fre 07/12/2023, 07/04/2024   Influenza,inj,Quad PF,6+ Mos 07/17/2013, 07/27/2014, 07/26/2015, 08/21/2016, 07/06/2017, 07/29/2018, 07/03/2019, 07/07/2022   Influenza-Unspecified 08/20/2020, 08/19/2021   MMR 03/08/2017, 04/05/2017   Moderna  Sars-Covid-2 Vaccination 06/06/2020, 07/04/2020   PPD Test 07/26/2015, 03/07/2018, 05/23/2018   Pneumococcal Polysaccharide-23 01/16/2015   Tdap 10/14/2015     Objective: Vital Signs: BP 118/77   Pulse 89   Temp 97.6 F (36.4 C)   Resp 14   Ht 5' 4 (1.626 m)   Wt (!) 304 lb 3.2 oz (138 kg)   LMP 12/04/2011   BMI 52.22 kg/m    Physical Exam Vitals and nursing note reviewed.  Constitutional:      Appearance: She is well-developed.  HENT:     Head: Normocephalic and atraumatic.  Eyes:     Conjunctiva/sclera: Conjunctivae normal.  Cardiovascular:     Rate and Rhythm: Normal rate and regular rhythm.     Heart sounds: Normal heart sounds.  Pulmonary:     Effort: Pulmonary effort is normal.     Breath sounds: Normal breath  sounds.  Abdominal:     General: Bowel sounds are normal.     Palpations: Abdomen is soft.  Musculoskeletal:     Cervical back: Normal range of motion.  Lymphadenopathy:     Cervical: No cervical adenopathy.  Skin:    General: Skin is warm and dry.     Capillary Refill: Capillary refill takes less than 2 seconds.  Neurological:     Mental Status: She is alert and oriented to person, place, and time.  Psychiatric:        Behavior: Behavior normal.      Musculoskeletal Exam: C-spine, thoracic spine, lumbar spine have good range of motion.  No midline spinal tenderness.  No SI joint tenderness.  Shoulder joints, elbow joints, wrist joints, MCPs, PIPs, DIPs have good range of motion with no synovitis.  Complete fist formation bilaterally.  Hip joints have good range of motion with no groin pain.  Knee joints have good range of motion no warmth or effusion.  Ankle joints have good range of motion no tenderness or joint swelling.  No evidence of Achilles tendinitis or plantar fasciitis.   CDAI Exam: CDAI Score: -- Patient Global: --; Provider Global: -- Swollen: --; Tender: -- Joint Exam 08/09/2024   No joint exam has been documented for this visit    There is currently no information documented on the homunculus. Go to the Rheumatology activity and complete the homunculus joint exam.  Investigation: No additional findings.  Imaging: XR Knee 1-2 Views Left Result Date: 07/21/2024 Radiographs of the left knee demonstrate well-maintained alignment with degenerative and sclerotic changes of the left knee no acute osseous injuries   Recent Labs: Lab Results  Component Value Date   WBC 6.8 08/01/2024   HGB 11.4 (L) 08/01/2024   PLT 433 (H) 08/01/2024   NA 141 08/01/2024   K 3.7 08/01/2024   CL 98 08/01/2024   CO2 33 (H) 08/01/2024   GLUCOSE 93 08/01/2024   BUN 12 08/01/2024   CREATININE 1.06 (H) 08/01/2024   BILITOT 0.3 08/01/2024   ALKPHOS 73 04/27/2024   AST 11 08/01/2024   ALT 9 08/01/2024   PROT 7.4 08/01/2024   ALBUMIN 3.9 04/27/2024   CALCIUM 9.6 08/01/2024   GFRAA 74 12/16/2020   QFTBGOLDPLUS NEGATIVE 07/04/2024    Speciality Comments: PLQ Eye Exam: 07/21/2024 WNL  @ [facility] [place] Follow up 1 year  Procedures:  No procedures performed Allergies: Penicillins   Assessment / Plan:     Visit Diagnoses: Undifferentiated connective tissue disease - AVISE+ANA, +APS IgM positive, hx of Ro+-repeat negative: She is taking Plaquenil  200 mg 1 tablet by mouth twice daily and Imuran  50 mg 1 tablet by mouth daily.  She has been tolerating Imuran  and has started to notice clinical benefit.  She has had less frequent and less severe flares since initiating Imuran .  She has had less stiffness in both hands and less joint pain. She has not had any recent facial rashes.  She continues to have chronic sicca symptoms and has been using over-the-counter products for symptomatic relief.  She had 1 oral ulcer about 2 weeks ago but no nasal ulcerations. Lab work on 08/01/24 was reviewed today in the office: ESR 77, dsDNA negative, complements pending, ANA remains positive, anticardiolipin antibodies negative.  Reviewed lab  results with the patient today-all questions were addressed. She will remain on Plaquenil  200 mg 1 tablet by mouth twice daily and Imuran  50 mg 1 tablet by mouth daily.  She is  apprehensive to increase the dose of Imuran  at this time and would like to give the current combination more time and for us  to reassess for the full efficacy.  She will follow-up in the office in 5 months or sooner if needed.  High risk medication use - Plaquenil  200 mg 1 tablet by mouth twice daily, Imuran  50 mg 1 tablet by mouth daily.  CBC and CMP updated on 08/01/24.   PLQ Eye Exam: 07/21/2024 WNL @ [facility] [place] Follow up 1 year   Chronic pain of right knee - Chronic pain. Crepitus with ROM.  She had bilateral knee joint cortisone injections performed on 07/21/2024 which have helped to alleviate the discomfort in both knees.  Her mobility has improved.  She has no warmth or effusion on exam.  Bilateral hand pain - Ultrasound on 12/23/2022 showed mild synovitis in the right second MCP joint. Responsive to prednisone .  She has no synovitis on examination today.  She is able to make a complete fist bilaterally.  Myofascial pain - She has some generalized hyperalgesia and positive tender points on exam.  Patient remains on Flexeril  10 mg 1 tablet at bedtime and gabapentin  300 mg 1 capsule at bedtime.  She takes Ultracet  every 6 hours PRN and trazodone  50 mg half tablet to 1 tablet at bedtime.   Other fatigue - Chronic, stable.   Family history of blood clots - Beta-2 glycoprotein antibodies, anticardiolipin antibodies, and lupus anticoagulant negative on 09/16/2021. Anticardiolipin antibodies negative on 08/01/2024.  Chronic SI joint pain - Chronic pain.  Facet arthropathy - Lumbar X-rays updated on 02/25/22: show grade one anterolisthesis L4-5 2-3 mm no change from 07/2020.  No symptoms of radiculopathy.   Other medical conditions are listed as follows:  Essential hypertension: Blood pressure was 118/77 today  in the office.  ADHD (attention deficit hyperactivity disorder), inattentive type  Vitamin D  deficiency: Vitamin D  was 22.65 on 08/01/2024.  Patient is taking vitamin D  50,000 units once weekly.  Gastroesophageal reflux disease without esophagitis  RLS (restless legs syndrome)  Former smoker  Diabetic polyneuropathy associated with diabetes mellitus due to underlying condition (HCC)  Moderate persistent reactive airway disease with acute exacerbation  Depression with anxiety  Orders: No orders of the defined types were placed in this encounter.  No orders of the defined types were placed in this encounter.    Follow-Up Instructions: Return in about 5 months (around 01/07/2025) for UCTD.   Waddell CHRISTELLA Craze, PA-C  Note - This record has been created using Dragon software.  Chart creation errors have been sought, but may not always  have been located. Such creation errors do not reflect on  the standard of medical care.

## 2024-07-28 ENCOUNTER — Ambulatory Visit: Admitting: Family Medicine

## 2024-07-28 ENCOUNTER — Other Ambulatory Visit: Payer: Self-pay

## 2024-07-28 ENCOUNTER — Other Ambulatory Visit: Payer: Self-pay | Admitting: Family Medicine

## 2024-07-28 ENCOUNTER — Other Ambulatory Visit (HOSPITAL_COMMUNITY): Payer: Self-pay

## 2024-07-28 ENCOUNTER — Encounter: Payer: Self-pay | Admitting: Family Medicine

## 2024-07-28 MED ORDER — LOSARTAN POTASSIUM 50 MG PO TABS
100.0000 mg | ORAL_TABLET | Freq: Every day | ORAL | 1 refills | Status: AC
  Filled 2024-07-28 (×2): qty 180, 90d supply, fill #0
  Filled 2024-12-04: qty 180, 90d supply, fill #1

## 2024-08-01 ENCOUNTER — Encounter: Payer: Self-pay | Admitting: Family Medicine

## 2024-08-01 ENCOUNTER — Other Ambulatory Visit (HOSPITAL_COMMUNITY): Payer: Self-pay

## 2024-08-01 ENCOUNTER — Other Ambulatory Visit: Payer: Self-pay

## 2024-08-01 ENCOUNTER — Ambulatory Visit: Admitting: Family Medicine

## 2024-08-01 VITALS — BP 128/88 | HR 89 | Temp 98.0°F | Ht 64.0 in | Wt 307.1 lb

## 2024-08-01 DIAGNOSIS — E0842 Diabetes mellitus due to underlying condition with diabetic polyneuropathy: Secondary | ICD-10-CM

## 2024-08-01 DIAGNOSIS — Z6841 Body Mass Index (BMI) 40.0 and over, adult: Secondary | ICD-10-CM

## 2024-08-01 DIAGNOSIS — M51369 Other intervertebral disc degeneration, lumbar region without mention of lumbar back pain or lower extremity pain: Secondary | ICD-10-CM

## 2024-08-01 DIAGNOSIS — E1142 Type 2 diabetes mellitus with diabetic polyneuropathy: Secondary | ICD-10-CM

## 2024-08-01 DIAGNOSIS — Z79899 Other long term (current) drug therapy: Secondary | ICD-10-CM

## 2024-08-01 DIAGNOSIS — E559 Vitamin D deficiency, unspecified: Secondary | ICD-10-CM

## 2024-08-01 DIAGNOSIS — I10 Essential (primary) hypertension: Secondary | ICD-10-CM

## 2024-08-01 DIAGNOSIS — Z7984 Long term (current) use of oral hypoglycemic drugs: Secondary | ICD-10-CM

## 2024-08-01 DIAGNOSIS — M359 Systemic involvement of connective tissue, unspecified: Secondary | ICD-10-CM

## 2024-08-01 DIAGNOSIS — R110 Nausea: Secondary | ICD-10-CM

## 2024-08-01 LAB — LIPID PANEL
Cholesterol: 143 mg/dL (ref 0–200)
HDL: 54.2 mg/dL (ref >39.00)
LDL Cholesterol: 78 mg/dL (ref 0–99)
NonHDL: 88.59
Total CHOL/HDL Ratio: 3
Triglycerides: 53 mg/dL (ref 0.0–149.0)
VLDL: 10.6 mg/dL (ref 0.0–40.0)

## 2024-08-01 LAB — B12 AND FOLATE PANEL
Folate: 7.9 ng/mL (ref >5.9)
Vitamin B-12: 564 pg/mL (ref 211–911)

## 2024-08-01 LAB — TSH: TSH: 1.53 u[IU]/mL (ref 0.35–5.50)

## 2024-08-01 LAB — VITAMIN D 25 HYDROXY (VIT D DEFICIENCY, FRACTURES): VITD: 22.65 ng/mL — ABNORMAL LOW (ref 30.00–100.00)

## 2024-08-01 MED ORDER — SEMAGLUTIDE (2 MG/DOSE) 8 MG/3ML ~~LOC~~ SOPN
2.0000 mg | PEN_INJECTOR | SUBCUTANEOUS | 1 refills | Status: DC
  Filled 2024-08-01 – 2024-08-10 (×4): qty 9, 84d supply, fill #0
  Filled 2024-10-02 – 2024-11-11 (×2): qty 9, 84d supply, fill #1
  Filled 2024-11-17: qty 3, 28d supply, fill #1

## 2024-08-01 MED ORDER — SCOPOLAMINE 1 MG/3DAYS TD PT72
1.0000 | MEDICATED_PATCH | TRANSDERMAL | 1 refills | Status: AC
  Filled 2024-08-01: qty 4, 12d supply, fill #0
  Filled 2024-08-10 (×2): qty 4, 12d supply, fill #1

## 2024-08-01 MED ORDER — PANTOPRAZOLE SODIUM 40 MG PO TBEC
40.0000 mg | DELAYED_RELEASE_TABLET | Freq: Every day | ORAL | 3 refills | Status: AC
  Filled 2024-08-01: qty 90, 90d supply, fill #0

## 2024-08-01 MED ORDER — DESONIDE 0.05 % EX CREA
TOPICAL_CREAM | Freq: Two times a day (BID) | CUTANEOUS | 3 refills | Status: AC
  Filled 2024-08-01: qty 30, 15d supply, fill #0
  Filled 2024-09-29: qty 30, 30d supply, fill #0

## 2024-08-01 MED ORDER — GABAPENTIN 300 MG PO CAPS
300.0000 mg | ORAL_CAPSULE | Freq: Every day | ORAL | 3 refills | Status: AC
  Filled 2024-08-01: qty 30, 30d supply, fill #0
  Filled 2024-09-29: qty 30, 30d supply, fill #1

## 2024-08-01 MED ORDER — FREESTYLE LANCETS MISC
12 refills | Status: AC
  Filled 2024-08-01: qty 100, 50d supply, fill #0

## 2024-08-01 MED ORDER — METFORMIN HCL ER 500 MG PO TB24
500.0000 mg | ORAL_TABLET | Freq: Two times a day (BID) | ORAL | 1 refills | Status: AC
  Filled 2024-08-01 – 2024-11-11 (×2): qty 180, 90d supply, fill #0

## 2024-08-01 MED ORDER — ONDANSETRON HCL 4 MG PO TABS
4.0000 mg | ORAL_TABLET | Freq: Three times a day (TID) | ORAL | 1 refills | Status: DC | PRN
  Filled 2024-08-01: qty 30, 10d supply, fill #0
  Filled 2024-09-29: qty 30, 10d supply, fill #1

## 2024-08-01 MED ORDER — LEVOCETIRIZINE DIHYDROCHLORIDE 5 MG PO TABS
5.0000 mg | ORAL_TABLET | Freq: Every day | ORAL | 1 refills | Status: AC
  Filled 2024-08-01: qty 90, 90d supply, fill #0

## 2024-08-01 MED ORDER — ALBUTEROL SULFATE HFA 108 (90 BASE) MCG/ACT IN AERS
2.0000 | INHALATION_SPRAY | Freq: Four times a day (QID) | RESPIRATORY_TRACT | 3 refills | Status: AC | PRN
  Filled 2024-08-01: qty 6.7, 25d supply, fill #0
  Filled 2024-09-29: qty 6.7, 25d supply, fill #1

## 2024-08-01 MED ORDER — ALPRAZOLAM 0.5 MG PO TABS
0.5000 mg | ORAL_TABLET | Freq: Two times a day (BID) | ORAL | 1 refills | Status: AC | PRN
  Filled 2024-08-01: qty 30, 15d supply, fill #0

## 2024-08-01 MED ORDER — CYCLOBENZAPRINE HCL 10 MG PO TABS
10.0000 mg | ORAL_TABLET | Freq: Every day | ORAL | 0 refills | Status: DC
  Filled 2024-08-01: qty 30, 30d supply, fill #0

## 2024-08-01 NOTE — Assessment & Plan Note (Signed)
 Ongoing issue.  She is down 6 lbs in 3 weeks on Semaglutide  2mg  weekly.  Encouraged low carb diet and regular exercise.  Will continue to follow.

## 2024-08-01 NOTE — Assessment & Plan Note (Signed)
 Chronic problem.  On Losartan , hydrochlorothiazide , Atenolol , Amlodipine  w/ good control.  Currently asymptomatic.  Check labs due to ARB and diuretic use.  No anticipated med changes.  Will follow.

## 2024-08-01 NOTE — Assessment & Plan Note (Signed)
 Chronic problem.  On Semaglutide  and Metformin .  Last A1C 5.7%.  UTD on eye exam, foot exam, microalbumin.  Pt reports increased GERD since increasing Semaglutide  but states she can handle this as she is happy w/ weight loss and sugar control.  Check labs.  Adjust meds prn

## 2024-08-01 NOTE — Patient Instructions (Signed)
 Schedule your complete physical in 4 months We'll notify you of your lab results and make any changes if needed Continue to work on healthy diet and regular exercise- you're doing great!!! Call with any questions or concerns Stay Safe!  Stay Healthy! Happy Fall!!!

## 2024-08-01 NOTE — Progress Notes (Signed)
   Subjective:    Patient ID: Rosaline LITTIE Haley, female    DOB: 09/24/1971, 53 y.o.   MRN: [id number]  HPI HTN- chronic problem, on Losartan  50mg  daily, hydrochlorothiazide  25mg  daily, Atenolol  100mg  daily, Amlodipine  5mg  daily w/ good control.  No CP, SOB, HA's, visual changes.  DM- chronic problem, on Semaglutide  2mg /weekly, Metformin  XR 500mg  BID.  Last A1C 5.7%.  UTD on eye exam, foot exam, microalbumin.  Pt reports increased reflux since going up on Ozempic .  No abd pain, N/V.  No numbness/tingling of hands/feet.  Obesity- down 6 lbs just since 9/9.  BMI now 52.72   Review of Systems For ROS see HPI     Objective:   Physical Exam Vitals reviewed.  Constitutional:      General: She is not in acute distress.    Appearance: Normal appearance. She is well-developed. She is obese. She is not ill-appearing.  HENT:     Head: Normocephalic and atraumatic.  Eyes:     Conjunctiva/sclera: Conjunctivae normal.     Pupils: Pupils are equal, round, and reactive to light.  Neck:     Thyroid : No thyromegaly.  Cardiovascular:     Rate and Rhythm: Normal rate and regular rhythm.     Pulses: Normal pulses.     Heart sounds: Normal heart sounds. No murmur heard. Pulmonary:     Effort: Pulmonary effort is normal. No respiratory distress.     Breath sounds: Normal breath sounds.  Abdominal:     General: There is no distension.     Palpations: Abdomen is soft.     Tenderness: There is no abdominal tenderness.  Musculoskeletal:     Cervical back: Normal range of motion and neck supple.     Right lower leg: No edema.     Left lower leg: No edema.  Lymphadenopathy:     Cervical: No cervical adenopathy.  Skin:    General: Skin is warm and dry.  Neurological:     Mental Status: She is alert and oriented to person, place, and time.  Psychiatric:        Behavior: Behavior normal.           Assessment & Plan:

## 2024-08-02 ENCOUNTER — Other Ambulatory Visit: Payer: Self-pay | Admitting: Family Medicine

## 2024-08-02 ENCOUNTER — Other Ambulatory Visit: Payer: Self-pay

## 2024-08-02 ENCOUNTER — Ambulatory Visit: Payer: Self-pay | Admitting: Family Medicine

## 2024-08-02 ENCOUNTER — Other Ambulatory Visit (HOSPITAL_COMMUNITY): Payer: Self-pay

## 2024-08-02 DIAGNOSIS — E559 Vitamin D deficiency, unspecified: Secondary | ICD-10-CM

## 2024-08-02 MED ORDER — FERROUS SULFATE 325 (65 FE) MG PO TABS
325.0000 mg | ORAL_TABLET | Freq: Every day | ORAL | 3 refills | Status: AC
  Filled 2024-08-02: qty 30, 30d supply, fill #0
  Filled 2024-09-29: qty 30, 30d supply, fill #1

## 2024-08-02 MED ORDER — VITAMIN D (ERGOCALCIFEROL) 1.25 MG (50000 UNIT) PO CAPS
50000.0000 [IU] | ORAL_CAPSULE | ORAL | 0 refills | Status: AC
  Filled 2024-08-02: qty 12, 84d supply, fill #0

## 2024-08-02 NOTE — Telephone Encounter (Signed)
 I have sent Vitamin D  to patient pharmacy

## 2024-08-03 ENCOUNTER — Other Ambulatory Visit (HOSPITAL_COMMUNITY): Payer: Self-pay

## 2024-08-03 NOTE — Telephone Encounter (Signed)
 Patient noted on her labs that her creatinine is high as well as her Hemoglobin and is asking if she should be concerned about these or if she should be doing something to help these numbers

## 2024-08-07 NOTE — Progress Notes (Signed)
 Labs are not consistent with a flare. Plan to discuss results in detail at visit on Wednesday.

## 2024-08-09 ENCOUNTER — Ambulatory Visit: Attending: Physician Assistant | Admitting: Physician Assistant

## 2024-08-09 ENCOUNTER — Encounter: Payer: Self-pay | Admitting: Physician Assistant

## 2024-08-09 VITALS — BP 118/77 | HR 89 | Temp 97.6°F | Resp 14 | Ht 64.0 in | Wt 304.2 lb

## 2024-08-09 DIAGNOSIS — F418 Other specified anxiety disorders: Secondary | ICD-10-CM

## 2024-08-09 DIAGNOSIS — M79641 Pain in right hand: Secondary | ICD-10-CM | POA: Diagnosis not present

## 2024-08-09 DIAGNOSIS — Z87891 Personal history of nicotine dependence: Secondary | ICD-10-CM

## 2024-08-09 DIAGNOSIS — E559 Vitamin D deficiency, unspecified: Secondary | ICD-10-CM

## 2024-08-09 DIAGNOSIS — F900 Attention-deficit hyperactivity disorder, predominantly inattentive type: Secondary | ICD-10-CM

## 2024-08-09 DIAGNOSIS — M533 Sacrococcygeal disorders, not elsewhere classified: Secondary | ICD-10-CM | POA: Diagnosis not present

## 2024-08-09 DIAGNOSIS — M25561 Pain in right knee: Secondary | ICD-10-CM

## 2024-08-09 DIAGNOSIS — E0842 Diabetes mellitus due to underlying condition with diabetic polyneuropathy: Secondary | ICD-10-CM

## 2024-08-09 DIAGNOSIS — Z8249 Family history of ischemic heart disease and other diseases of the circulatory system: Secondary | ICD-10-CM

## 2024-08-09 DIAGNOSIS — M79642 Pain in left hand: Secondary | ICD-10-CM

## 2024-08-09 DIAGNOSIS — I10 Essential (primary) hypertension: Secondary | ICD-10-CM

## 2024-08-09 DIAGNOSIS — M359 Systemic involvement of connective tissue, unspecified: Secondary | ICD-10-CM

## 2024-08-09 DIAGNOSIS — M7918 Myalgia, other site: Secondary | ICD-10-CM

## 2024-08-09 DIAGNOSIS — G2581 Restless legs syndrome: Secondary | ICD-10-CM

## 2024-08-09 DIAGNOSIS — K219 Gastro-esophageal reflux disease without esophagitis: Secondary | ICD-10-CM

## 2024-08-09 DIAGNOSIS — J4541 Moderate persistent asthma with (acute) exacerbation: Secondary | ICD-10-CM

## 2024-08-09 DIAGNOSIS — Z79899 Other long term (current) drug therapy: Secondary | ICD-10-CM

## 2024-08-09 DIAGNOSIS — R5383 Other fatigue: Secondary | ICD-10-CM | POA: Diagnosis not present

## 2024-08-09 DIAGNOSIS — G8929 Other chronic pain: Secondary | ICD-10-CM

## 2024-08-09 DIAGNOSIS — M47819 Spondylosis without myelopathy or radiculopathy, site unspecified: Secondary | ICD-10-CM | POA: Diagnosis not present

## 2024-08-09 LAB — ANTI-NUCLEAR AB-TITER (ANA TITER)
ANA TITER: 1:80 {titer} — ABNORMAL HIGH
ANA Titer 1: 1:160 {titer} — ABNORMAL HIGH

## 2024-08-09 LAB — CBC WITH DIFFERENTIAL/PLATELET
Absolute Lymphocytes: 2645 {cells}/uL (ref 850–3900)
Absolute Monocytes: 422 {cells}/uL (ref 200–950)
Basophils Absolute: 20 {cells}/uL (ref 0–200)
Basophils Relative: 0.3 %
Eosinophils Absolute: 122 {cells}/uL (ref 15–500)
Eosinophils Relative: 1.8 %
HCT: 37.5 % (ref 35.0–45.0)
Hemoglobin: 11.4 g/dL — ABNORMAL LOW (ref 11.7–15.5)
MCH: 27.4 pg (ref 27.0–33.0)
MCHC: 30.4 g/dL — ABNORMAL LOW (ref 32.0–36.0)
MCV: 90.1 fL (ref 80.0–100.0)
MPV: 9.1 fL (ref 7.5–12.5)
Monocytes Relative: 6.2 %
Neutro Abs: 3590 {cells}/uL (ref 1500–7800)
Neutrophils Relative %: 52.8 %
Platelets: 433 Thousand/uL — ABNORMAL HIGH (ref 140–400)
RBC: 4.16 Million/uL (ref 3.80–5.10)
RDW: 15.3 % — ABNORMAL HIGH (ref 11.0–15.0)
Total Lymphocyte: 38.9 %
WBC: 6.8 Thousand/uL (ref 3.8–10.8)

## 2024-08-09 LAB — COMPREHENSIVE METABOLIC PANEL WITH GFR
AG Ratio: 1.2 (calc) (ref 1.0–2.5)
ALT: 9 U/L (ref 6–29)
AST: 11 U/L (ref 10–35)
Albumin: 4 g/dL (ref 3.6–5.1)
Alkaline phosphatase (APISO): 81 U/L (ref 37–153)
BUN/Creatinine Ratio: 11 (calc) (ref 6–22)
BUN: 12 mg/dL (ref 7–25)
CO2: 33 mmol/L — ABNORMAL HIGH (ref 20–32)
Calcium: 9.6 mg/dL (ref 8.6–10.4)
Chloride: 98 mmol/L (ref 98–110)
Creat: 1.06 mg/dL — ABNORMAL HIGH (ref 0.50–1.03)
Globulin: 3.4 g/dL (ref 1.9–3.7)
Glucose, Bld: 93 mg/dL (ref 65–99)
Potassium: 3.7 mmol/L (ref 3.5–5.3)
Sodium: 141 mmol/L (ref 135–146)
Total Bilirubin: 0.3 mg/dL (ref 0.2–1.2)
Total Protein: 7.4 g/dL (ref 6.1–8.1)
eGFR: 64 mL/min/1.73m2 (ref >=60)

## 2024-08-09 LAB — PROTEIN / CREATININE RATIO, URINE
Creatinine, Urine: 431 mg/dL — ABNORMAL HIGH (ref 20–275)
Protein/Creat Ratio: 53 mg/g{creat} (ref 24–184)
Protein/Creatinine Ratio: 0.053 mg/mg{creat} (ref 0.024–0.184)
Total Protein, Urine: 23 mg/dL (ref 5–24)

## 2024-08-09 LAB — ANTI-DNA ANTIBODY, DOUBLE-STRANDED: ds DNA Ab: <1 [IU]/mL

## 2024-08-09 LAB — CARDIOLIPIN ANTIBODIES, IGG, IGM, IGA
Anticardiolipin IgA: <2 [APL'U]/mL (ref <20.0)
Anticardiolipin IgG: <2 [GPL'U]/mL (ref <20.0)
Anticardiolipin IgM: <2 [MPL'U]/mL (ref <20.0)

## 2024-08-09 LAB — SEDIMENTATION RATE: Sed Rate: 77 mm/h — ABNORMAL HIGH (ref 0–30)

## 2024-08-09 LAB — ANA: Anti Nuclear Antibody (ANA): POSITIVE — AB

## 2024-08-09 LAB — SJOGRENS SYNDROME-A EXTRACTABLE NUCLEAR ANTIBODY: SSA (Ro) (ENA) Antibody, IgG: <1 AI

## 2024-08-09 LAB — C3 AND C4
C3 Complement: 200 mg/dL — ABNORMAL HIGH (ref 83–193)
C4 Complement: 59 mg/dL — ABNORMAL HIGH (ref 15–57)

## 2024-08-10 ENCOUNTER — Other Ambulatory Visit (HOSPITAL_COMMUNITY): Payer: Self-pay

## 2024-08-23 ENCOUNTER — Other Ambulatory Visit: Payer: Self-pay | Admitting: Family Medicine

## 2024-08-24 ENCOUNTER — Other Ambulatory Visit (HOSPITAL_COMMUNITY): Payer: Self-pay

## 2024-08-24 ENCOUNTER — Other Ambulatory Visit: Payer: Self-pay

## 2024-08-24 MED ORDER — CLOTRIMAZOLE-BETAMETHASONE 1-0.05 % EX CREA
1.0000 | TOPICAL_CREAM | Freq: Every day | CUTANEOUS | 0 refills | Status: AC
  Filled 2024-08-24: qty 45, 30d supply, fill #0

## 2024-08-25 ENCOUNTER — Encounter: Payer: Self-pay | Admitting: Family Medicine

## 2024-08-28 ENCOUNTER — Ambulatory Visit: Payer: Self-pay

## 2024-08-28 ENCOUNTER — Ambulatory Visit: Admitting: Family Medicine

## 2024-08-28 ENCOUNTER — Encounter: Payer: Self-pay | Admitting: Family Medicine

## 2024-08-28 ENCOUNTER — Other Ambulatory Visit (HOSPITAL_COMMUNITY): Payer: Self-pay

## 2024-08-28 VITALS — BP 134/80 | HR 79 | Temp 98.0°F | Wt 314.2 lb

## 2024-08-28 DIAGNOSIS — R051 Acute cough: Secondary | ICD-10-CM

## 2024-08-28 LAB — POC COVID19 BINAXNOW: SARS Coronavirus 2 Ag: NEGATIVE

## 2024-08-28 MED ORDER — PROMETHAZINE-DM 6.25-15 MG/5ML PO SYRP
5.0000 mL | ORAL_SOLUTION | Freq: Four times a day (QID) | ORAL | 0 refills | Status: AC | PRN
  Filled 2024-08-28: qty 180, 9d supply, fill #0

## 2024-08-28 MED ORDER — DOXYCYCLINE HYCLATE 100 MG PO TABS
100.0000 mg | ORAL_TABLET | Freq: Two times a day (BID) | ORAL | 0 refills | Status: AC
  Filled 2024-08-28: qty 20, 10d supply, fill #0

## 2024-08-28 NOTE — Progress Notes (Signed)
   Subjective:    Patient ID: Vanessa Reeves, female    DOB: [birth year], 53 y.o.   MRN: [id number]  HPI URI- started sneezing on Tuesday, coughing on Thursday.  Recently returned from traveling and multiple sick people on the plane.  No fever.  + sinus pressure.  Cough is now painful, productive of clear sputum.  + SOB and wheezing.  COVID test negative.   Review of Systems For ROS see HPI     Objective:   Physical Exam Vitals reviewed.  Constitutional:      General: She is not in acute distress.    Appearance: Normal appearance. She is obese. She is not ill-appearing.  HENT:     Head: Normocephalic and atraumatic.     Right Ear: Tympanic membrane and ear canal normal.     Left Ear: Tympanic membrane and ear canal normal.     Nose: Congestion present. No rhinorrhea.     Comments: + TTP over frontal and maxillary sinuses    Mouth/Throat:     Mouth: Mucous membranes are moist.     Pharynx: Oropharynx is clear. No posterior oropharyngeal erythema.  Eyes:     Extraocular Movements: Extraocular movements intact.     Conjunctiva/sclera: Conjunctivae normal.  Cardiovascular:     Rate and Rhythm: Normal rate and regular rhythm.  Pulmonary:     Effort: Pulmonary effort is normal. No respiratory distress.     Breath sounds: No wheezing or rhonchi.     Comments: Decreased air movement throughout + harsh, hacking cough Musculoskeletal:     Cervical back: Neck supple.  Lymphadenopathy:     Cervical: No cervical adenopathy.  Skin:    General: Skin is warm and dry.  Neurological:     General: No focal deficit present.     Mental Status: She is alert and oriented to person, place, and time.  Psychiatric:        Mood and Affect: Mood normal.        Behavior: Behavior normal.           Assessment & Plan:  Acute cough- new.  Given recent travel, underlying diabetes and autoimmune conditions pt is at higher risk for bacterial infxn.  + TTP over sinuses, cough is harsh and deep.   Start Doxycycline , continue albuterol  prn.  Add cough meds prn.  Reviewed supportive care and red flags that should prompt return.  Pt expressed understanding and is in agreement w/ plan.

## 2024-08-28 NOTE — Patient Instructions (Signed)
 Follow up as needed or as scheduled START the Doxycycline  twice daily- w/ food Drink LOTS of fluids CONTINUE your Albuterol  inhaler as needed USE the cough syrup as needed REST! Call with any questions or concerns Hang in there!

## 2024-08-28 NOTE — Telephone Encounter (Signed)
 Patient called to reports coughing, sneezing and wheezing since 10/23. Patient recently got home from a cruise. Reports generally not feeling well. Patient did make an acute appointment with PCP for today at 2 PM. Patient called to ask if that could be changed to a virtual appointment. Patient was educated that due to symptoms, the more appropriate option is an in person visit. Patient states she will keep the appointment with PCP at 2 PM.   FYI Only or Action Required?: FYI only for provider.  Patient was last seen in primary care on 08/01/2024 by Mahlon Comer BRAVO, MD.  Called Nurse Triage reporting Cough.  Symptoms began 10/23.  Interventions attempted: Rest, hydration, or home remedies.  Symptoms are: unchanged.  Triage Disposition: See HCP Within 4 Hours (Or PCP Triage)  Patient/caregiver understands and will follow disposition?: Yes  Copied from CRM [id number]. Topic: Clinical - Red Word Triage >> Aug 28, 2024  9:40 AM Berneda FALCON wrote: Red Word that prompted transfer to Nurse Triage: Patient states she came back from vacation and was very sick.  States she tested negative for COVID yesterday. Patient states she has been sick since Thursday 10/23. She has Lupus.  Cough, sneezing, wheezing-no fever.  She scheduled a VV with Tabori at 2 PM today but wanted to see if she could do a VV instead. Triaged for wheezing Reason for Disposition  Wheezing is present  Answer Assessment - Initial Assessment Questions 1. ONSET: When did the cough begin?      Started 08/24/2024 2. SEVERITY: How bad is the cough today?      severe 3. SPUTUM: Describe the color of your sputum (e.g., none, dry cough; clear, white, yellow, green)     Dry hacking cough 4. HEMOPTYSIS: Are you coughing up any blood? If Yes, ask: How much? (e.g., flecks, streaks, tablespoons, etc.)     no 5. DIFFICULTY BREATHING: Are you having difficulty breathing? If Yes, ask: How bad is it? (e.g., mild, moderate,  severe)      mild 6. FEVER: Do you have a fever? If Yes, ask: What is your temperature, how was it measured, and when did it start?     no 7. CARDIAC HISTORY: Do you have any history of heart disease? (e.g., heart attack, congestive heart failure)      no 8. LUNG HISTORY: Do you have any history of lung disease?  (e.g., pulmonary embolus, asthma, emphysema)     asthma 9. PE RISK FACTORS: Do you have a history of blood clots? (or: recent major surgery, recent prolonged travel, bedridden)     no 10. OTHER SYMPTOMS: Do you have any other symptoms? (e.g., runny nose, wheezing, chest pain)       wheezing 12. TRAVEL: Have you traveled out of the country in the last month? (e.g., travel history, exposures)       Patient reports she went on a cruise.  Protocols used: Cough - Acute Non-Productive-A-AH

## 2024-08-29 ENCOUNTER — Encounter: Payer: Self-pay | Admitting: Family Medicine

## 2024-09-01 ENCOUNTER — Encounter: Payer: Self-pay | Admitting: Family Medicine

## 2024-09-01 NOTE — Telephone Encounter (Signed)
 Patient saw Dr. Mahlon on 08/28/24. She was given abx. In her notes it says for her to use cough syrup as needed. I am confirming if it was over the counter and which one she is using. Please advise.

## 2024-09-01 NOTE — Telephone Encounter (Signed)
 Patient is using codeine  as prescribed but it is not working. She would like something else.

## 2024-09-02 ENCOUNTER — Other Ambulatory Visit: Payer: Self-pay | Admitting: Family

## 2024-09-02 MED ORDER — METHYLPREDNISOLONE 4 MG PO TBPK
ORAL_TABLET | ORAL | 0 refills | Status: AC
  Filled 2024-09-02: qty 21, 6d supply, fill #0

## 2024-09-03 ENCOUNTER — Other Ambulatory Visit (HOSPITAL_COMMUNITY): Payer: Self-pay

## 2024-09-04 ENCOUNTER — Encounter: Payer: Self-pay | Admitting: Radiology

## 2024-09-29 ENCOUNTER — Other Ambulatory Visit: Payer: Self-pay | Admitting: Family Medicine

## 2024-09-29 ENCOUNTER — Other Ambulatory Visit: Payer: Self-pay | Admitting: Physician Assistant

## 2024-09-29 DIAGNOSIS — M51369 Other intervertebral disc degeneration, lumbar region without mention of lumbar back pain or lower extremity pain: Secondary | ICD-10-CM

## 2024-09-30 ENCOUNTER — Other Ambulatory Visit (HOSPITAL_COMMUNITY): Payer: Self-pay

## 2024-10-02 ENCOUNTER — Other Ambulatory Visit: Payer: Self-pay

## 2024-10-02 ENCOUNTER — Other Ambulatory Visit (HOSPITAL_COMMUNITY): Payer: Self-pay

## 2024-10-02 MED ORDER — FLUTICASONE PROPIONATE 50 MCG/ACT NA SUSP
2.0000 | Freq: Every day | NASAL | 6 refills | Status: AC
  Filled 2024-10-02: qty 16, 30d supply, fill #0

## 2024-10-02 MED ORDER — AZATHIOPRINE 50 MG PO TABS
50.0000 mg | ORAL_TABLET | Freq: Every day | ORAL | 2 refills | Status: AC
  Filled 2024-10-02: qty 30, 30d supply, fill #0
  Filled 2024-11-11: qty 30, 30d supply, fill #1

## 2024-10-02 MED ORDER — CYCLOBENZAPRINE HCL 10 MG PO TABS
10.0000 mg | ORAL_TABLET | Freq: Every day | ORAL | 0 refills | Status: AC
  Filled 2024-10-02: qty 30, 30d supply, fill #0

## 2024-10-02 NOTE — Telephone Encounter (Signed)
 Last Fill: 05/25/2024  Labs: 08/01/2024 hemoglobin 11.4, MCHC 30.4, RDW 15.3, platelets 433, creat 1.06, CO2 33  Next Visit: 01/08/2025  Last Visit: 08/09/2024  DX: Undifferentiated connective tissue disease   Current Dose per office note on 08/09/2024: Imuran  50 mg 1 tablet by mouth daily.   Okay to refill Imuran ?

## 2024-10-02 NOTE — Telephone Encounter (Signed)
 Requested Prescriptions   Pending Prescriptions Disp Refills   cyclobenzaprine  (FLEXERIL ) 10 MG tablet 30 tablet 0    Sig: Take 1 tablet (10 mg total) by mouth at bedtime.   Signed Prescriptions Disp Refills   fluticasone  (FLONASE ) 50 MCG/ACT nasal spray 16 g 6    Sig: Place 2 sprays into both nostrils daily.    Authorizing Provider: TABORI, KATHERINE E    Ordering User: Breasia Karges K     Date of patient request: 10/02/2024 Last office visit: 08/28/2024 Upcoming visit: 01/26/2025 Date of last refill: 08/01/2024 Last refill amount: 30

## 2024-10-09 ENCOUNTER — Other Ambulatory Visit (HOSPITAL_COMMUNITY): Payer: Self-pay

## 2024-10-11 ENCOUNTER — Emergency Department (HOSPITAL_BASED_OUTPATIENT_CLINIC_OR_DEPARTMENT_OTHER)

## 2024-10-11 ENCOUNTER — Other Ambulatory Visit (HOSPITAL_BASED_OUTPATIENT_CLINIC_OR_DEPARTMENT_OTHER): Payer: Self-pay

## 2024-10-11 ENCOUNTER — Encounter (HOSPITAL_BASED_OUTPATIENT_CLINIC_OR_DEPARTMENT_OTHER): Payer: Self-pay | Admitting: Emergency Medicine

## 2024-10-11 ENCOUNTER — Other Ambulatory Visit: Payer: Self-pay

## 2024-10-11 ENCOUNTER — Emergency Department (HOSPITAL_BASED_OUTPATIENT_CLINIC_OR_DEPARTMENT_OTHER)
Admission: EM | Admit: 2024-10-11 | Discharge: 2024-10-11 | Disposition: A | Discharge status: Home / Self Care | Attending: Emergency Medicine | Admitting: Emergency Medicine

## 2024-10-11 DIAGNOSIS — R059 Cough, unspecified: Secondary | ICD-10-CM | POA: Diagnosis not present

## 2024-10-11 DIAGNOSIS — R079 Chest pain, unspecified: Secondary | ICD-10-CM | POA: Diagnosis not present

## 2024-10-11 DIAGNOSIS — R1011 Right upper quadrant pain: Secondary | ICD-10-CM | POA: Diagnosis not present

## 2024-10-11 DIAGNOSIS — N3000 Acute cystitis without hematuria: Secondary | ICD-10-CM | POA: Insufficient documentation

## 2024-10-11 DIAGNOSIS — N63 Unspecified lump in unspecified breast: Secondary | ICD-10-CM | POA: Diagnosis not present

## 2024-10-11 DIAGNOSIS — R0789 Other chest pain: Secondary | ICD-10-CM | POA: Diagnosis not present

## 2024-10-11 DIAGNOSIS — R160 Hepatomegaly, not elsewhere classified: Secondary | ICD-10-CM | POA: Diagnosis not present

## 2024-10-11 DIAGNOSIS — J069 Acute upper respiratory infection, unspecified: Secondary | ICD-10-CM | POA: Diagnosis not present

## 2024-10-11 DIAGNOSIS — K5710 Diverticulosis of small intestine without perforation or abscess without bleeding: Secondary | ICD-10-CM | POA: Diagnosis not present

## 2024-10-11 LAB — CBC WITH DIFFERENTIAL/PLATELET
Abs Immature Granulocytes: 0.01 K/uL (ref 0.00–0.07)
Basophils Absolute: 0 K/uL (ref 0.0–0.1)
Basophils Relative: 1 %
Eosinophils Absolute: 0.2 K/uL (ref 0.0–0.5)
Eosinophils Relative: 5 %
HCT: 36.8 % (ref 36.0–46.0)
Hemoglobin: 11.8 g/dL — ABNORMAL LOW (ref 12.0–15.0)
Immature Granulocytes: 0 %
Lymphocytes Relative: 27 %
Lymphs Abs: 1.4 K/uL (ref 0.7–4.0)
MCH: 28 pg (ref 26.0–34.0)
MCHC: 32.1 g/dL (ref 30.0–36.0)
MCV: 87.2 fL (ref 80.0–100.0)
Monocytes Absolute: 0.6 K/uL (ref 0.1–1.0)
Monocytes Relative: 11 %
Neutro Abs: 2.9 K/uL (ref 1.7–7.7)
Neutrophils Relative %: 56 %
Platelets: 353 K/uL (ref 150–400)
RBC: 4.22 MIL/uL (ref 3.87–5.11)
RDW: 14.6 % (ref 11.5–15.5)
WBC: 5.2 K/uL (ref 4.0–10.5)
nRBC: 0 % (ref 0.0–0.2)

## 2024-10-11 LAB — URINALYSIS, ROUTINE W REFLEX MICROSCOPIC
Glucose, UA: NEGATIVE mg/dL
Ketones, ur: 15 mg/dL — AB
Leukocytes,Ua: NEGATIVE
Nitrite: NEGATIVE
Protein, ur: 100 mg/dL — AB
Specific Gravity, Urine: >=1.03 (ref 1.005–1.030)
pH: 7 (ref 5.0–8.0)

## 2024-10-11 LAB — URINALYSIS, MICROSCOPIC (REFLEX)

## 2024-10-11 LAB — RESP PANEL BY RT-PCR (RSV, FLU A&B, COVID)  RVPGX2
Influenza A by PCR: NEGATIVE
Influenza B by PCR: NEGATIVE
Resp Syncytial Virus by PCR: NEGATIVE
SARS Coronavirus 2 by RT PCR: NEGATIVE

## 2024-10-11 LAB — COMPREHENSIVE METABOLIC PANEL WITH GFR
ALT: 11 U/L (ref 0–44)
AST: 18 U/L (ref 15–41)
Albumin: 4.2 g/dL (ref 3.5–5.0)
Alkaline Phosphatase: 84 U/L (ref 38–126)
Anion gap: 12 (ref 5–15)
BUN: 7 mg/dL (ref 6–20)
CO2: 29 mmol/L (ref 22–32)
Calcium: 9.6 mg/dL (ref 8.9–10.3)
Chloride: 97 mmol/L — ABNORMAL LOW (ref 98–111)
Creatinine, Ser: 0.93 mg/dL (ref 0.44–1.00)
GFR, Estimated: >60 mL/min (ref >60)
Glucose, Bld: 99 mg/dL (ref 70–99)
Potassium: 3.4 mmol/L — ABNORMAL LOW (ref 3.5–5.1)
Sodium: 139 mmol/L (ref 135–145)
Total Bilirubin: 0.3 mg/dL (ref 0.0–1.2)
Total Protein: 8.4 g/dL — ABNORMAL HIGH (ref 6.5–8.1)

## 2024-10-11 LAB — TROPONIN T, HIGH SENSITIVITY: Troponin T High Sensitivity: <15 ng/L (ref 0–19)

## 2024-10-11 MED ORDER — HYDROCODONE-ACETAMINOPHEN 5-325 MG PO TABS
2.0000 | ORAL_TABLET | ORAL | 0 refills | Status: AC | PRN
  Filled 2024-10-11: qty 12, 1d supply, fill #0

## 2024-10-11 MED ORDER — NITROFURANTOIN MONOHYD MACRO 100 MG PO CAPS
100.0000 mg | ORAL_CAPSULE | Freq: Two times a day (BID) | ORAL | 0 refills | Status: AC
  Filled 2024-10-11: qty 10, 5d supply, fill #0

## 2024-10-11 MED ORDER — HYDROMORPHONE HCL 1 MG/ML IJ SOLN
1.0000 mg | Freq: Once | INTRAMUSCULAR | Status: AC
  Administered 2024-10-11: 1 mg via INTRAVENOUS
  Filled 2024-10-11: qty 1

## 2024-10-11 MED ORDER — IOHEXOL 350 MG/ML SOLN
100.0000 mL | Freq: Once | INTRAVENOUS | Status: AC | PRN
  Administered 2024-10-11: 100 mL via INTRAVENOUS

## 2024-10-11 MED ORDER — ONDANSETRON HCL 4 MG/2ML IJ SOLN
4.0000 mg | Freq: Once | INTRAMUSCULAR | Status: AC
  Administered 2024-10-11: 4 mg via INTRAVENOUS
  Filled 2024-10-11: qty 2

## 2024-10-11 MED ORDER — LACTATED RINGERS IV BOLUS
1000.0000 mL | Freq: Once | INTRAVENOUS | Status: AC
  Administered 2024-10-11: 1000 mL via INTRAVENOUS

## 2024-10-11 NOTE — ED Provider Notes (Signed)
 [place] [facility] Provider Note   CSN: [id number] Arrival date & time: 10/11/24  1058     Patient presents with: Multiple Complaints   Vanessa Reeves is a 53 y.o. female.   53 year old female with a history of lupus on azathioprine  and Plaquenil  who presents to the emergency department with right upper quadrant abdominal pain.  Patient reports that since Friday she has had some coughing and sneezing.  Productive of yellow sputum.  Today started having right upper quadrant abdominal pain that radiates to her mid abdomen.  Nausea but no vomiting. Also has been having some shortness of breath.  Pleuritic.  Not postprandial.  6/10 in severity.  Describes the right upper quadrant pain as dull.  Hysterectomy but still has her gallbladder.  Chills but no fevers.  No urinary frequency or dysuria.       Prior to Admission medications   Medication Sig Start Date End Date Taking? Authorizing Provider  HYDROcodone -acetaminophen  (NORCO/VICODIN) 5-325 MG tablet Take 2 tablets by mouth every 4 (four) hours as needed. 10/11/24  Yes Yolande Lamar BROCKS, MD  nitrofurantoin , macrocrystal-monohydrate, (MACROBID ) 100 MG capsule Take 1 capsule (100 mg total) by mouth 2 (two) times daily. 10/11/24  Yes Yolande Lamar BROCKS, MD  albuterol  (VENTOLIN  HFA) [phone] Base) MCG/ACT inhaler Inhale 2 puffs into the lungs every 6 (six) hours as needed for wheezing or shortness of breath. 08/01/24   Tabori, Katherine E, MD  ALPRAZolam  (XANAX ) 0.5 MG tablet Take 1 tablet by mouth 2 (two) times daily as needed for anxiety. 08/01/24   Tabori, Katherine E, MD  amLODipine  (NORVASC ) 5 MG tablet Take 1 tablet (5 mg total) by mouth daily. 05/09/24   Tabori, Katherine E, MD  atenolol  (TENORMIN ) 50 MG tablet Take 2 tablets (100 mg total) by mouth at bedtime. 10/20/23 10/19/24  Tabori, Katherine E, MD  azaTHIOprine  (IMURAN ) 50 MG tablet Take 1 tablet (50 mg total) by mouth daily. 10/02/24   Cheryl Waddell HERO, PA-C  Biotin 89999 MCG TABS Take 1 tablet by mouth daily.    [provider]  Blood Glucose Monitoring Suppl (FREESTYLE LITE) w/Device KIT Use as directed to test blood glucose once to twice daily. 10/20/23   Tabori, Katherine E, MD  clotrimazole -betamethasone  (LOTRISONE ) cream Apply topically once a day as directed. 08/24/24   Tabori, Katherine E, MD  cyclobenzaprine  (FLEXERIL ) 10 MG tablet Take 1 tablet (10 mg total) by mouth at bedtime. 10/02/24   Tabori, Katherine E, MD  desonide  (DESOWEN ) 0.05 % cream Apply to affected area 2 (two) times daily. 08/01/24   Tabori, Katherine E, MD  doxycycline  (VIBRA -TABS) 100 MG tablet Take 1 tablet (100 mg total) by mouth 2 (two) times daily. 08/28/24   Tabori, Katherine E, MD  escitalopram  (LEXAPRO ) 20 MG tablet Take 1 tablet (20 mg total) by mouth daily. 05/22/24   Tabori, Katherine E, MD  ferrous sulfate  (FEROSUL) 325 (65 FE) MG tablet Take 1 tablet (325 mg total) by mouth daily with breakfast. 08/02/24   Levora Reyes SAUNDERS, MD  fluticasone  (FLONASE ) 50 MCG/ACT nasal spray Place 2 sprays into both nostrils daily. 10/02/24   Tabori, Katherine E, MD  fluticasone  (FLOVENT  HFA) 110 MCG/ACT inhaler Inhale 2 puffs into the lungs 2 (two) times daily. 10/20/23   Mahlon Comer BRAVO, MD  furosemide  (LASIX ) 20 MG tablet Take 1 tablet (20 mg total) by mouth daily. 05/09/24 05/09/25  Mahlon Comer BRAVO, MD  gabapentin  (NEURONTIN ) 300 MG capsule Take  1 capsule (300 mg total) by mouth at bedtime. 08/01/24   Tabori, Katherine E, MD  glucose blood [facility] VERIO) test strip Use as instructed 01/14/24   Tabori, Katherine E, MD  hydrochlorothiazide  (HYDRODIURIL ) 25 MG tablet Take 1 tablet (25 mg total) by mouth daily. 05/09/24   Tabori, Katherine E, MD  hydroxychloroquine  (PLAQUENIL ) 200 MG tablet Take 1 tablet (200 mg total) by mouth 2 (two) times daily. 05/25/24   Cheryl Waddell HERO, PA-C  Lancets (FREESTYLE) lancets Use to test blood glucose once or twice daily. 08/01/24    Tabori, Katherine E, MD  levocetirizine (XYZAL ) 5 MG tablet Take 1 tablet (5 mg total) by mouth daily. 08/01/24   Tabori, Katherine E, MD  losartan  (COZAAR ) 50 MG tablet Take 2 tablets (100 mg total) by mouth daily. 07/28/24   Mahlon Comer BRAVO, MD  metFORMIN  (GLUCOPHAGE -XR) 500 MG 24 hr tablet Take 1 tablet (500 mg total) by mouth 2 (two) times daily with food. 08/01/24   Mahlon Comer BRAVO, MD  methylPREDNISolone  (MEDROL  DOSEPAK) 4 MG TBPK tablet As directed 09/02/24   Webb, Padonda B, FNP  ondansetron  (ZOFRAN ) 4 MG tablet Take 1 tablet (4 mg total) by mouth every 8 (eight) hours as needed for nausea or vomiting. 08/01/24   Tabori, Katherine E, MD  pantoprazole  (PROTONIX ) 40 MG tablet Take 1 tablet (40 mg total) by mouth daily. 08/01/24   Tabori, Katherine E, MD  potassium chloride  SA (KLOR-CON  M) 20 MEQ tablet Take 1 tablet (20 mEq total) by mouth daily. 01/24/24   Tabori, Katherine E, MD  promethazine -dextromethorphan (PROMETHAZINE -DM) 6.25-15 MG/5ML syrup Take 5 mLs by mouth 4 (four) times daily as needed. 08/28/24   Tabori, Katherine E, MD  scopolamine  (TRANSDERM-SCOP) 1 MG/3DAYS Place 1 patch (1 mg total) onto the skin every 3 (three) days. 08/01/24   Tabori, Katherine E, MD  Semaglutide , 2 MG/DOSE, 8 MG/3ML SOPN Inject 2 mg as directed once a week. 08/01/24   Tabori, Katherine E, MD  Sulfacetamide  Sodium-Sulfur  10-5 % LIQD Use twice a day as directed. 06/28/24   Tabori, Katherine E, MD  traMADol -acetaminophen  (ULTRACET ) 37.5-325 MG tablet Take 1 tablet by mouth every 6 (six) hours as needed. 07/21/24 01/17/25  Tabori, Katherine E, MD  traZODone  (DESYREL ) 50 MG tablet TAKE 1/2 TO 1 TABLET BY MOUTH AT BEDTIME AS NEEDED FOR SLEEP 01/26/23 08/09/24  Tabori, Katherine E, MD  VITAMIN D  PO Take by mouth.    [provider]  Vitamin D , Ergocalciferol , (DRISDOL ) 1.25 MG (50000 UNIT) CAPS capsule Take 1 capsule (50,000 Units total) by mouth every 7 (seven) days. 08/02/24   Tabori, Katherine E, MD     Allergies: Penicillins    Review of Systems  Updated Vital Signs BP (!) 144/88 (BP Location: Left Arm)   Pulse 77   Temp 98.5 F (36.9 C)   Resp 17   Ht 5' 5 (1.651 m)   Wt (!) 138.8 kg   LMP 12/04/2011   SpO2 97%   BMI 50.92 kg/m   Physical Exam Vitals and nursing note reviewed.  Constitutional:      General: She is not in acute distress.    Appearance: She is well-developed.  HENT:     Head: Normocephalic and atraumatic.     Right Ear: External ear normal.     Left Ear: External ear normal.     Nose: Nose normal.  Eyes:     Extraocular Movements: Extraocular movements intact.     Conjunctiva/sclera: Conjunctivae normal.  Pupils: Pupils are equal, round, and reactive to light.  Cardiovascular:     Rate and Rhythm: Normal rate and regular rhythm.     Heart sounds: No murmur heard. Pulmonary:     Effort: Pulmonary effort is normal. No respiratory distress.     Breath sounds: Normal breath sounds.  Abdominal:     General: Abdomen is flat. There is no distension.     Palpations: Abdomen is soft. There is no mass.     Tenderness: There is abdominal tenderness (Right upper quadrant). There is no right CVA tenderness, left CVA tenderness or guarding.  Musculoskeletal:     Cervical back: Normal range of motion and neck supple.     Right lower leg: No edema.     Left lower leg: No edema.  Skin:    General: Skin is warm and dry.  Neurological:     Mental Status: She is alert and oriented to person, place, and time. Mental status is at baseline.  Psychiatric:        Mood and Affect: Mood normal.     (all labs ordered are listed, but only abnormal results are displayed) Labs Reviewed  COMPREHENSIVE METABOLIC PANEL WITH GFR - Abnormal; Notable for the following components:      Result Value   Potassium 3.4 (*)    Chloride 97 (*)    Total Protein 8.4 (*)    All other components within normal limits  URINALYSIS, ROUTINE W REFLEX MICROSCOPIC - Abnormal;  Notable for the following components:   APPearance CLOUDY (*)    Hgb urine dipstick LARGE (*)    Bilirubin Urine SMALL (*)    Ketones, ur 15 (*)    Protein, ur 100 (*)    All other components within normal limits  CBC WITH DIFFERENTIAL/PLATELET - Abnormal; Notable for the following components:   Hemoglobin 11.8 (*)    All other components within normal limits  URINALYSIS, MICROSCOPIC (REFLEX) - Abnormal; Notable for the following components:   Bacteria, UA MANY (*)    All other components within normal limits  RESP PANEL BY RT-PCR (RSV, FLU A&B, COVID)  [id number]  URINE CULTURE  CBC WITH DIFFERENTIAL/PLATELET  TROPONIN T, HIGH SENSITIVITY    EKG: EKG Interpretation Date/Time:  Wednesday October 11 2024 11:09:19 EST Ventricular Rate:  89 PR Interval:  164 QRS Duration:  87 QT Interval:  373 QTC Calculation: 454 R Axis:   -12  Text Interpretation: Sinus rhythm Low voltage, precordial leads Abnormal R-wave progression, early transition Left ventricular hypertrophy Confirmed by Yolande Charleston [phone]) on 10/11/2024 12:28:23 PM  Radiology: CT Angio Chest PE W and/or Wo Contrast Result Date: 10/11/2024 CLINICAL DATA:  Cough and sneezing. Right upper quadrant pain that radiates to the chest. Lightheadedness and headache. EXAM: CT ANGIOGRAPHY CHEST CT ABDOMEN AND PELVIS WITH CONTRAST TECHNIQUE: Multidetector CT imaging of the chest was performed using the standard protocol during bolus administration of intravenous contrast. Multiplanar CT image reconstructions and MIPs were obtained to evaluate the vascular anatomy. Multidetector CT imaging of the abdomen and pelvis was performed using the standard protocol during bolus administration of intravenous contrast. RADIATION DOSE REDUCTION: This exam was performed according to the departmental dose-optimization program which includes automated exposure control, adjustment of the mA and/or kV according to patient size and/or use of iterative  reconstruction technique. CONTRAST:  100mL OMNIPAQUE IOHEXOL 350 MG/ML SOLN COMPARISON:  None Available. FINDINGS: CTA CHEST FINDINGS Cardiovascular: Negative for pulmonary embolus. Atherosclerotic calcification of the aorta. Enlarged pulmonic trunk  and heart. No pericardial effusion. Mediastinum/Nodes: No pathologically enlarged mediastinal, hilar or axillary lymph nodes. 2.3 x 2.7 cm nodule in the subareolar right breast (302/31). 10 mm nodule in the lateral left breast (image 37). Esophagus is grossly unremarkable. Lungs/Pleura: Image quality is degraded by expiratory phase imaging, creating added density in the lungs. No pleural fluid. Airway is unremarkable. Musculoskeletal: Degenerative changes in the spine. Review of the MIP images confirms the above findings. CT ABDOMEN and PELVIS FINDINGS Hepatobiliary: Small cyst or hemangioma in the right hepatic lobe. No specific follow-up necessary. Liver is enlarged, 19.6 cm. Liver and gallbladder are otherwise unremarkable. No biliary ductal dilatation. Pancreas: Negative. Spleen: Negative. Adrenals/Urinary Tract: Adrenal glands and kidneys are unremarkable. Ureters are decompressed. Bladder is relatively low in volume. Stomach/Bowel: Stomach is unremarkable. Duodenal diverticula are incidentally noted. Small bowel, appendix and colon are otherwise unremarkable. Vascular/Lymphatic: Vascular structures are unremarkable. No pathologically enlarged lymph nodes. Reproductive: Hysterectomy.  No adnexal mass. Other: No free fluid.  Mesenteries and peritoneum are unremarkable. Musculoskeletal: Degenerative changes in the spine. Review of the MIP images confirms the above findings. IMPRESSION: 1. Negative for pulmonary embolus. 2. No findings to explain the patient's clinical history. 3. Bilateral breast nodules. Previous screening mammogram 05/08/2024 with subsequent diagnostic mammography, ultrasound and biopsy of the left breast. Please correlate clinically and consider  physical exam and ultrasound of the right breast. 4. Hepatomegaly. 5.  Aortic atherosclerosis (ICD10-I70.0). 6. Enlarged pulmonic trunk, indicative of pulmonary arterial hypertension. Electronically Signed   By: Newell Eke M.D.   On: 10/11/2024 14:56   CT ABDOMEN PELVIS W CONTRAST Result Date: 10/11/2024 CLINICAL DATA:  Cough and sneezing. Right upper quadrant pain that radiates to the chest. Lightheadedness and headache. EXAM: CT ANGIOGRAPHY CHEST CT ABDOMEN AND PELVIS WITH CONTRAST TECHNIQUE: Multidetector CT imaging of the chest was performed using the standard protocol during bolus administration of intravenous contrast. Multiplanar CT image reconstructions and MIPs were obtained to evaluate the vascular anatomy. Multidetector CT imaging of the abdomen and pelvis was performed using the standard protocol during bolus administration of intravenous contrast. RADIATION DOSE REDUCTION: This exam was performed according to the departmental dose-optimization program which includes automated exposure control, adjustment of the mA and/or kV according to patient size and/or use of iterative reconstruction technique. CONTRAST:  100mL OMNIPAQUE IOHEXOL 350 MG/ML SOLN COMPARISON:  None Available. FINDINGS: CTA CHEST FINDINGS Cardiovascular: Negative for pulmonary embolus. Atherosclerotic calcification of the aorta. Enlarged pulmonic trunk and heart. No pericardial effusion. Mediastinum/Nodes: No pathologically enlarged mediastinal, hilar or axillary lymph nodes. 2.3 x 2.7 cm nodule in the subareolar right breast (302/31). 10 mm nodule in the lateral left breast (image 37). Esophagus is grossly unremarkable. Lungs/Pleura: Image quality is degraded by expiratory phase imaging, creating added density in the lungs. No pleural fluid. Airway is unremarkable. Musculoskeletal: Degenerative changes in the spine. Review of the MIP images confirms the above findings. CT ABDOMEN and PELVIS FINDINGS Hepatobiliary: Small cyst or  hemangioma in the right hepatic lobe. No specific follow-up necessary. Liver is enlarged, 19.6 cm. Liver and gallbladder are otherwise unremarkable. No biliary ductal dilatation. Pancreas: Negative. Spleen: Negative. Adrenals/Urinary Tract: Adrenal glands and kidneys are unremarkable. Ureters are decompressed. Bladder is relatively low in volume. Stomach/Bowel: Stomach is unremarkable. Duodenal diverticula are incidentally noted. Small bowel, appendix and colon are otherwise unremarkable. Vascular/Lymphatic: Vascular structures are unremarkable. No pathologically enlarged lymph nodes. Reproductive: Hysterectomy.  No adnexal mass. Other: No free fluid.  Mesenteries and peritoneum are unremarkable. Musculoskeletal: Degenerative changes in the  spine. Review of the MIP images confirms the above findings. IMPRESSION: 1. Negative for pulmonary embolus. 2. No findings to explain the patient's clinical history. 3. Bilateral breast nodules. Previous screening mammogram 05/08/2024 with subsequent diagnostic mammography, ultrasound and biopsy of the left breast. Please correlate clinically and consider physical exam and ultrasound of the right breast. 4. Hepatomegaly. 5.  Aortic atherosclerosis (ICD10-I70.0). 6. Enlarged pulmonic trunk, indicative of pulmonary arterial hypertension. Electronically Signed   By: Newell Eke M.D.   On: 10/11/2024 14:56   US  Abdomen Limited RUQ (LIVER/GB) Result Date: 10/11/2024 EXAM: Right Upper Quadrant Abdominal Ultrasound 10/11/2024 02:00:00 PM TECHNIQUE: Real-time ultrasonography of the right upper quadrant of the abdomen was performed. COMPARISON: None available. CLINICAL HISTORY: RUQ pain. FINDINGS: LIVER: The liver demonstrates normal echogenicity. No intrahepatic biliary ductal dilatation. No evidence of mass. BILIARY SYSTEM: Gallbladder wall thickness measures 1.7 mm. No pericholecystic fluid. No cholelithiasis. Common bile duct measures 3.7 mm. RIGHT KIDNEY: The right kidney is  grossly unremarkable in appearances without evidence of hydronephrosis, echogenic calculi or worrisome mass lesions. PANCREAS: Visualized portions of the pancreas are unremarkable. OTHER: No right upper quadrant ascites. IMPRESSION: 1. No acute findings. Electronically signed by: Lynwood Seip MD 10/11/2024 02:44 PM EST RP Workstation: [id number]   DG Chest 2 View Result Date: 10/11/2024 CLINICAL DATA:  Cough. EXAM: CHEST - 2 VIEW COMPARISON:  08/03/2019. FINDINGS: The heart size and mediastinal contours are within normal limits. Both lungs are clear. No pleural effusion or pneumothorax. No acute osseous abnormality. IMPRESSION: No acute cardiopulmonary findings. Electronically Signed   By: Harrietta Sherry M.D.   On: 10/11/2024 11:57     Procedures   Medications Ordered in the ED  lactated ringers  bolus 1,000 mL (0 mLs Intravenous Stopped 10/11/24 1534)  HYDROmorphone  (DILAUDID ) injection 1 mg (1 mg Intravenous Given 10/11/24 1433)  ondansetron  (ZOFRAN ) injection 4 mg (4 mg Intravenous Given 10/11/24 1431)  iohexol (OMNIPAQUE) 350 MG/ML injection 100 mL (100 mLs Intravenous Contrast Given 10/11/24 1358)                                    Medical Decision Making Amount and/or Complexity of Data Reviewed Labs: ordered. Radiology: ordered.  Risk Prescription drug management.   Vanessa Reeves is a 53 year old female with a history of lupus on azathioprine  and Plaquenil  who presents to the emergency department with right upper quadrant abdominal pain, cough, shortness of breath, and URI symptoms  Initial Ddx:  URI, pneumonia, pleurisy, PE, cholecystitis  MDM/Course:  Patient presents emergency department URI type symptoms.  Also is having some right upper quadrant abdominal pain.  On exam is overall well-appearing.  No abnormal lung sounds.  She is not septic.  She is afebrile.  Does have right upper quadrant tenderness to palpation.  COVID and flu negative.  Chest x-ray without  pneumonia.  Given  the fact that she is on immunosuppressants and is hypercoagulable because of her lupus did obtain a CTA to evaluate for occult pneumonia or PE.  This was negative.  Also had a CT of the abdomen and pelvis that did not reveal acute findings.  Did have some breast nodules that she will need to follow-up about with her PCP.  Right upper quadrant ultrasound without cholecystitis.  EKG and troponin not reflective of MI.  Upon re-evaluation feeling better after the pain and nausea medicine.  Had a urinalysis that appears to be contaminated.  Not currently having urinary symptoms but she is quite concerned about this given her immunocompromise state.  Will send off a urine culture and start her on Macrobid .  Given a short dose of pain medication in case her pain is coming from her lupus as well.  Low to follow-up with her primary doctor in several days  This patient presents to the ED for concern of complaints listed in HPI, this involves an extensive number of treatment options, and is a complaint that carries with it a high risk of complications and morbidity. Disposition including potential need for admission considered.   Dispo: DC Home. Return precautions discussed including, but not limited to, those listed in the AVS. Allowed pt time to ask questions which were answered fully prior to dc.  Additional history obtained from spouse Records reviewed Outpatient Clinic Notes The following labs were independently interpreted: Chemistry and show no acute abnormality I independently reviewed the following imaging with scope of interpretation limited to determining acute life threatening conditions related to emergency care: Chest x-ray and agree with the radiologist interpretation with the following exceptions: none I personally reviewed and interpreted cardiac monitoring: normal sinus rhythm  I personally reviewed and interpreted the pt's EKG: see above for interpretation  I have reviewed the  patients home medications and made adjustments as needed  Portions of this note were generated with Dragon dictation software. Dictation errors may occur despite best attempts at proofreading.     Final diagnoses:  Upper respiratory tract infection, unspecified type  Breast nodule  Acute cystitis without hematuria  Chest pain, unspecified type    ED Discharge Orders          Ordered    HYDROcodone -acetaminophen  (NORCO/VICODIN) 5-325 MG tablet  Every 4 hours PRN        10/11/24 1531    nitrofurantoin , macrocrystal-monohydrate, (MACROBID ) 100 MG capsule  2 times daily        10/11/24 1531               Yolande Lamar BROCKS, MD 10/11/24 1546

## 2024-10-11 NOTE — ED Notes (Signed)
 Patient transported to CT

## 2024-10-11 NOTE — ED Notes (Signed)
 Attempted to obtain an IV x 2 attempts Unable to get IV to flush.  CBC redrawn

## 2024-10-11 NOTE — ED Triage Notes (Signed)
 Pt reports cough and sneezing since Friday; early this morning she woke up with RUQ pain that radiates to chest, lightheadedness and HA

## 2024-10-11 NOTE — Discharge Instructions (Signed)
 You were seen for your upper respiratory tract infection and urinary tract infection in the emergency department.   At home, please take Tylenol  and ibuprofen  for your pain. You may also take the norco we have prescribed you for any breakthrough pain that may have.  Please note that it contains tylenol  so do not exceed the daily recommended dosage of tylenol . Do not take this before driving or operating heavy machinery.  Do not take this medication with alcohol.  Take the Macrobid  for your possible UTI.    Check your MyChart online for the results of any tests that had not resulted by the time you left the emergency department.   Follow-up with your primary doctor in 2-3 days regarding your visit.    Return immediately to the emergency department if you experience any of the following: Worsening pain, fevers, or any other concerning symptoms.    Thank you for visiting our Emergency Department. It was a pleasure taking care of you today.

## 2024-10-12 ENCOUNTER — Encounter: Payer: Self-pay | Admitting: Family Medicine

## 2024-10-12 ENCOUNTER — Other Ambulatory Visit (HOSPITAL_COMMUNITY): Payer: Self-pay

## 2024-10-12 MED ORDER — ALBUTEROL SULFATE (2.5 MG/3ML) 0.083% IN NEBU
2.5000 mg | INHALATION_SOLUTION | Freq: Four times a day (QID) | RESPIRATORY_TRACT | 1 refills | Status: AC | PRN
  Filled 2024-10-12: qty 150, 13d supply, fill #0

## 2024-10-13 ENCOUNTER — Other Ambulatory Visit (HOSPITAL_COMMUNITY): Payer: Self-pay

## 2024-10-13 LAB — URINE CULTURE

## 2024-11-01 ENCOUNTER — Ambulatory Visit: Admitting: Family Medicine

## 2024-11-02 ENCOUNTER — Other Ambulatory Visit: Payer: Self-pay | Admitting: Family Medicine

## 2024-11-02 ENCOUNTER — Other Ambulatory Visit: Payer: Self-pay

## 2024-11-02 ENCOUNTER — Other Ambulatory Visit: Payer: Self-pay | Admitting: Physician Assistant

## 2024-11-02 DIAGNOSIS — M359 Systemic involvement of connective tissue, unspecified: Secondary | ICD-10-CM

## 2024-11-03 ENCOUNTER — Ambulatory Visit: Admitting: Physician Assistant

## 2024-11-03 ENCOUNTER — Other Ambulatory Visit (HOSPITAL_COMMUNITY): Payer: Self-pay

## 2024-11-03 ENCOUNTER — Other Ambulatory Visit: Payer: Self-pay

## 2024-11-03 DIAGNOSIS — M170 Bilateral primary osteoarthritis of knee: Secondary | ICD-10-CM

## 2024-11-03 MED ORDER — TRIAMCINOLONE ACETONIDE 40 MG/ML IJ SUSP
40.0000 mg | INTRAMUSCULAR | Status: AC | PRN
  Administered 2024-11-03: 40 mg via INTRA_ARTICULAR

## 2024-11-03 MED ORDER — LIDOCAINE HCL 1 % IJ SOLN
4.0000 mL | INTRAMUSCULAR | Status: AC | PRN
  Administered 2024-11-03: 4 mL

## 2024-11-03 MED ORDER — HYDROXYCHLOROQUINE SULFATE 200 MG PO TABS
200.0000 mg | ORAL_TABLET | Freq: Two times a day (BID) | ORAL | 0 refills | Status: AC
  Filled 2024-11-03 – 2024-11-06 (×3): qty 180, 90d supply, fill #0

## 2024-11-03 MED ORDER — HYDROCHLOROTHIAZIDE 25 MG PO TABS
25.0000 mg | ORAL_TABLET | Freq: Every day | ORAL | 0 refills | Status: AC
  Filled 2024-11-03 – 2024-11-04 (×2): qty 90, 90d supply, fill #0

## 2024-11-03 NOTE — Telephone Encounter (Signed)
 Last Fill: 05/25/2024  Eye exam: 07/21/2024 WNL    Labs: 10/11/2024 CBC hemoglobin:11.8, CMP potassium:3.4, chloride:97, total protein:8.4  Next Visit: 01/08/2025  Last Visit: 08/09/2024  DX: Undifferentiated connective tissue disease   Current Dose per office note 08/09/2024: Plaquenil  200 mg 1 tablet by mouth twice daily   Okay to refill Plaquenil ?

## 2024-11-03 NOTE — Telephone Encounter (Signed)
 Requested Prescriptions   Pending Prescriptions Disp Refills   traMADol -acetaminophen  (ULTRACET ) 37.5-325 MG tablet 30 tablet 0    Sig: Take 1 tablet by mouth every 6 (six) hours as needed.   Signed Prescriptions Disp Refills   hydrochlorothiazide  (HYDRODIURIL ) 25 MG tablet 90 tablet 0    Sig: Take 1 tablet (25 mg total) by mouth daily.    Authorizing Provider: TABORI, KATHERINE E    Ordering User: Ilse Billman A     Date of patient request: 11/03/24 Last office visit: 08/28/2024 Upcoming visit: 01/26/2025 Date of last refill: 07/21/24 Last refill amount: 30

## 2024-11-03 NOTE — Progress Notes (Signed)
 "  Office Visit Note   Patient: Vanessa Reeves           Date of Birth: 09-15-1971           MRN: [id number] Visit Date: 11/03/2024              Requested by: Mahlon Comer BRAVO, MD [phone],  [place] [address] PCP: Mahlon Comer BRAVO, MD  Chief Complaint  Patient presents with   Right Knee - Pain   Left Knee - Pain      HPI: Patient is a pleasant 54 year old woman who comes in periodically for injections into her knees.  She has had no new injury.  She has had injections in the past and tolerated them well  Assessment & Plan: Visit Diagnoses:  1. Primary osteoarthritis of both knees     Plan: Went forward with injections May follow-up as needed  Follow-Up Instructions: No follow-ups on file.   Ortho Exam  Patient is alert, oriented, no adenopathy, well-dressed, normal affect, normal respiratory effort. Bilateral knees no erythema no effusion compartments are soft and compressible she is neurovascular intact no evidence of infection still has crepitus with range of motion    Imaging: No results found. No images are attached to the encounter.  Labs: Lab Results  Component Value Date   HGBA1C 5.7 (A) 07/21/2024   HGBA1C 6.1 04/27/2024   HGBA1C 6.5 01/21/2024   ESRSEDRATE 77 (H) 08/01/2024   ESRSEDRATE 75 (H) 04/27/2024   ESRSEDRATE 87 (H) 12/30/2023   CRP 26.1 (H) 08/24/2023   REPTSTATUS 10/13/2024 FINAL 10/11/2024   CULT MULTIPLE SPECIES PRESENT, SUGGEST RECOLLECTION (A) 10/11/2024   LABORGA NO GROWTH 03/30/2014     Lab Results  Component Value Date   ALBUMIN 4.2 10/11/2024   ALBUMIN 3.9 04/27/2024   ALBUMIN 4.2 01/21/2024    Lab Results  Component Value Date   MG 2.0 01/21/2024   Lab Results  Component Value Date   VD25OH 22.65 (L) 08/01/2024   VD25OH 33.45 01/21/2024   VD25OH 15.65 (L) 01/20/2023    No results found for: PREALBUMIN    Latest Ref Rng & Units 10/11/2024   11:48 AM 08/01/2024    9:50 AM 04/27/2024     8:36 AM  CBC EXTENDED  WBC 4.0 - 10.5 K/uL 5.2  6.8  5.2   RBC 3.87 - 5.11 MIL/uL 4.22  4.16  3.93   Hemoglobin 12.0 - 15.0 g/dL 88.1  88.5  88.7   HCT 36.0 - 46.0 % 36.8  37.5  34.1   Platelets 150 - 400 K/uL 353  433  394.0   NEUT# 1.7 - 7.7 K/uL 2.9  3,590  2.4   Lymph# 0.7 - 4.0 K/uL 1.4   2.2      There is no height or weight on file to calculate BMI.  Orders:  No orders of the defined types were placed in this encounter.  No orders of the defined types were placed in this encounter.    Procedures: Large Joint Inj: bilateral knee on 11/03/2024 2:04 PM Indications: pain and diagnostic evaluation Details: 25 G 1.5 in needle, anteromedial approach  Arthrogram: No  Medications (Right): 4 mL lidocaine  1 %; 40 mg triamcinolone  acetonide 40 MG/ML Medications (Left): 4 mL lidocaine  1 %; 40 mg triamcinolone  acetonide 40 MG/ML Outcome: tolerated well, no immediate complications Procedure, treatment alternatives, risks and benefits explained, specific risks discussed. Consent was given by the patient. Immediately prior to procedure  a time out was called to verify the correct patient, procedure, equipment, support staff and site/side marked as required.     Clinical Data: No additional findings.  ROS:  All other systems negative, except as noted in the HPI. Review of Systems  Objective: Vital Signs: LMP 12/04/2011   Specialty Comments:  No specialty comments available.  PMFS History: Patient Active Problem List   Diagnosis Date Noted   Osteoarthritis of knees, bilateral 07/21/2024   Pain in right knee 09/24/2023   Autoimmune disease 08/19/2023   Polyp of transverse colon    Polyp of sigmoid colon    Polyp of rectum    Hearing loss of right ear 09/04/2020   Hyperlipidemia associated with type 2 diabetes mellitus (HCC) 02/21/2020   Insomnia 02/21/2020   Vitamin D  deficiency 02/21/2020   Degenerative disc disease, lumbar 02/21/2020   DM (diabetes  mellitus) type II controlled, neurological manifestation (HCC) 06/14/2019   Special screening for malignant neoplasms, colon 04/11/2018   Memory changes 11/23/2017   RLS (restless legs syndrome) 03/17/2017   ADHD (attention deficit hyperactivity disorder), 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   Reactive airway disease 08/21/2016   Gastroesophageal reflux disease without esophagitis 12/20/2015   Breast mass, left 02/16/2014   Chest pain 12/29/2013   Depression with anxiety 07/17/2013   Essential hypertension 07/17/2013   Eustachian tube dysfunction 07/17/2013   Adult BMI 50.0-59.9 kg/sq m (HCC) 07/17/2013   Past Medical History:  Diagnosis Date   Allergy    Anxiety    Arthritis    HANDS   Asthma    HAS MDI   Complication of anesthesia    Depression    Diabetes mellitus without complication (HCC)    Difficult airway for intubation    2013 used Video Laryngoscope   GERD (gastroesophageal reflux disease)    Herpes    Hyperlipidemia    Hypertension    RLS (restless legs syndrome) 03/17/2017   Right leg   Special screening for malignant neoplasms, colon 04/11/2018    Family History  Problem Relation Age of Onset   Hypertension Mother    Clotting disorder Mother    Stroke Father    Thrombosis Father    Congestive Heart Failure Father    Hypertension Paternal Grandmother    Diabetes Mellitus II Paternal Grandmother    Lupus Paternal Grandmother    Healthy Daughter    Healthy Son    Colon cancer Neg Hx    Esophageal cancer Neg Hx    Pancreatic cancer Neg Hx    Stomach cancer Neg Hx    Liver disease Neg Hx    Colon polyps Neg Hx     Past Surgical History:  Procedure Laterality Date   BREAST BIOPSY Left 06/01/2024   MM LT BREAST BX W LOC DEV 1ST LESION IMAGE BX SPEC STEREO GUIDE 06/01/2024 [facility]   COLONOSCOPY WITH PROPOFOL  N/A 07/09/2021   Procedure: COLONOSCOPY WITH PROPOFOL ;  Surgeon: Shila Gustav GAILS, MD;  Location:  [facility];  Service: Endoscopy;  Laterality: N/A;   DILATION AND CURETTAGE OF UTERUS     POLYPECTOMY  07/09/2021   Procedure: POLYPECTOMY;  Surgeon: Shila Gustav GAILS, MD;  Location: [facility];  Service: Endoscopy;;   TOTAL ABDOMINAL HYSTERECTOMY     TUBAL LIGATION     Social History   Occupational History   Not on file  Tobacco Use   Smoking status: Former    Types: Cigarettes    Passive exposure: Never   Smokeless tobacco: Never  Vaping Use   Vaping status: Never Used  Substance and Sexual Activity   Alcohol use: Yes    Comment: occ   Drug use: Not Currently    Types: Marijuana   Sexual activity: Yes    Birth control/protection: Surgical       "

## 2024-11-04 ENCOUNTER — Other Ambulatory Visit (HOSPITAL_COMMUNITY): Payer: Self-pay

## 2024-11-06 ENCOUNTER — Other Ambulatory Visit: Payer: Self-pay

## 2024-11-06 ENCOUNTER — Other Ambulatory Visit (HOSPITAL_COMMUNITY): Payer: Self-pay

## 2024-11-06 MED ORDER — TRAMADOL-ACETAMINOPHEN 37.5-325 MG PO TABS
1.0000 | ORAL_TABLET | Freq: Four times a day (QID) | ORAL | 0 refills | Status: AC | PRN
  Filled 2024-11-06: qty 30, 8d supply, fill #0

## 2024-11-07 ENCOUNTER — Other Ambulatory Visit: Payer: Self-pay

## 2024-11-08 ENCOUNTER — Other Ambulatory Visit: Payer: Self-pay

## 2024-11-10 ENCOUNTER — Ambulatory Visit: Admitting: Family Medicine

## 2024-11-11 ENCOUNTER — Other Ambulatory Visit (HOSPITAL_COMMUNITY): Payer: Self-pay

## 2024-11-11 ENCOUNTER — Other Ambulatory Visit: Payer: Self-pay | Admitting: Family Medicine

## 2024-11-11 ENCOUNTER — Other Ambulatory Visit: Payer: Self-pay

## 2024-11-11 DIAGNOSIS — R110 Nausea: Secondary | ICD-10-CM

## 2024-11-12 ENCOUNTER — Other Ambulatory Visit: Payer: Self-pay

## 2024-11-13 ENCOUNTER — Other Ambulatory Visit: Payer: Self-pay

## 2024-11-13 ENCOUNTER — Other Ambulatory Visit (HOSPITAL_COMMUNITY): Payer: Self-pay

## 2024-11-13 MED ORDER — ATENOLOL 50 MG PO TABS
100.0000 mg | ORAL_TABLET | Freq: Every day | ORAL | 3 refills | Status: AC
  Filled 2024-11-13: qty 180, 90d supply, fill #0

## 2024-11-13 MED ORDER — ONDANSETRON HCL 4 MG PO TABS
4.0000 mg | ORAL_TABLET | Freq: Three times a day (TID) | ORAL | 1 refills | Status: AC | PRN
  Filled 2024-11-13: qty 30, 10d supply, fill #0

## 2024-11-13 NOTE — Telephone Encounter (Signed)
 Requested Prescriptions   Pending Prescriptions Disp Refills   ondansetron  (ZOFRAN ) 4 MG tablet 30 tablet 1    Sig: Take 1 tablet (4 mg total) by mouth every 8 (eight) hours as needed for nausea or vomiting.     Date of patient request: 11/13/2024 Last office visit: 08/28/2024 Upcoming visit: 11/20/2024 Date of last refill: 08/01/2024 Last refill amount: 30x1

## 2024-11-14 ENCOUNTER — Other Ambulatory Visit: Payer: Self-pay

## 2024-11-16 ENCOUNTER — Other Ambulatory Visit (HOSPITAL_COMMUNITY): Payer: Self-pay

## 2024-11-17 ENCOUNTER — Other Ambulatory Visit: Payer: Self-pay

## 2024-11-17 ENCOUNTER — Other Ambulatory Visit (HOSPITAL_COMMUNITY): Payer: Self-pay

## 2024-11-20 ENCOUNTER — Ambulatory Visit: Admitting: Family Medicine

## 2024-12-04 ENCOUNTER — Other Ambulatory Visit: Payer: Self-pay

## 2024-12-05 ENCOUNTER — Encounter: Payer: Self-pay | Admitting: Family Medicine

## 2024-12-05 NOTE — Telephone Encounter (Signed)
 Patient notes her ozempic  is not working anymore and she has started to have a significant appetite lately

## 2024-12-06 ENCOUNTER — Other Ambulatory Visit: Payer: Self-pay

## 2024-12-06 ENCOUNTER — Other Ambulatory Visit (HOSPITAL_COMMUNITY): Payer: Self-pay

## 2024-12-06 MED ORDER — TIRZEPATIDE 7.5 MG/0.5ML ~~LOC~~ SOAJ
7.5000 mg | SUBCUTANEOUS | 1 refills | Status: AC
  Filled 2024-12-06: qty 2, 28d supply, fill #0

## 2024-12-06 NOTE — Telephone Encounter (Signed)
 Patient would like to try alternative medication to see how this works

## 2024-12-07 ENCOUNTER — Ambulatory Visit: Admitting: Family Medicine

## 2025-01-08 ENCOUNTER — Ambulatory Visit: Admitting: Physician Assistant

## 2025-01-26 ENCOUNTER — Encounter: Admitting: Family Medicine

## 2025-04-12 ENCOUNTER — Encounter: Admitting: Family Medicine
